# Patient Record
Sex: Female | Born: 1955 | Race: White | Hispanic: No | Marital: Married | Smoking: Never smoker
Health system: Southern US, Community
[De-identification: ages and names within clinical notes are randomized; demographics above are authoritative.]

## PROBLEM LIST (undated history)

## (undated) DIAGNOSIS — F419 Anxiety disorder, unspecified: Secondary | ICD-10-CM

## (undated) DIAGNOSIS — Z9109 Other allergy status, other than to drugs and biological substances: Secondary | ICD-10-CM

## (undated) DIAGNOSIS — I10 Essential (primary) hypertension: Secondary | ICD-10-CM

## (undated) DIAGNOSIS — F32A Depression, unspecified: Secondary | ICD-10-CM

## (undated) DIAGNOSIS — K759 Inflammatory liver disease, unspecified: Secondary | ICD-10-CM

## (undated) DIAGNOSIS — R0602 Shortness of breath: Secondary | ICD-10-CM

## (undated) DIAGNOSIS — Z9221 Personal history of antineoplastic chemotherapy: Secondary | ICD-10-CM

## (undated) DIAGNOSIS — C801 Malignant (primary) neoplasm, unspecified: Secondary | ICD-10-CM

## (undated) DIAGNOSIS — H409 Unspecified glaucoma: Secondary | ICD-10-CM

## (undated) DIAGNOSIS — Z923 Personal history of irradiation: Secondary | ICD-10-CM

## (undated) DIAGNOSIS — K219 Gastro-esophageal reflux disease without esophagitis: Secondary | ICD-10-CM

## (undated) DIAGNOSIS — R51 Headache: Secondary | ICD-10-CM

## (undated) DIAGNOSIS — Z9889 Other specified postprocedural states: Secondary | ICD-10-CM

## (undated) DIAGNOSIS — R232 Flushing: Secondary | ICD-10-CM

## (undated) DIAGNOSIS — M810 Age-related osteoporosis without current pathological fracture: Secondary | ICD-10-CM

## (undated) DIAGNOSIS — C50919 Malignant neoplasm of unspecified site of unspecified female breast: Secondary | ICD-10-CM

## (undated) DIAGNOSIS — F329 Major depressive disorder, single episode, unspecified: Secondary | ICD-10-CM

## (undated) DIAGNOSIS — E785 Hyperlipidemia, unspecified: Secondary | ICD-10-CM

## (undated) DIAGNOSIS — R519 Headache, unspecified: Secondary | ICD-10-CM

## (undated) DIAGNOSIS — J302 Other seasonal allergic rhinitis: Secondary | ICD-10-CM

## (undated) DIAGNOSIS — H269 Unspecified cataract: Secondary | ICD-10-CM

## (undated) DIAGNOSIS — R112 Nausea with vomiting, unspecified: Secondary | ICD-10-CM

## (undated) HISTORY — DX: Depression, unspecified: F32.A

## (undated) HISTORY — PX: BREAST SURGERY: SHX581

## (undated) HISTORY — DX: Essential (primary) hypertension: I10

## (undated) HISTORY — PX: TUBAL LIGATION: SHX77

## (undated) HISTORY — DX: Other seasonal allergic rhinitis: J30.2

## (undated) HISTORY — DX: Other allergy status, other than to drugs and biological substances: Z91.09

## (undated) HISTORY — DX: Age-related osteoporosis without current pathological fracture: M81.0

## (undated) HISTORY — PX: DILATION AND CURETTAGE OF UTERUS: SHX78

## (undated) HISTORY — DX: Anxiety disorder, unspecified: F41.9

## (undated) HISTORY — PX: WISDOM TOOTH EXTRACTION: SHX21

## (undated) HISTORY — DX: Unspecified cataract: H26.9

## (undated) HISTORY — PX: TONSILECTOMY/ADENOIDECTOMY WITH MYRINGOTOMY: SHX6125

## (undated) HISTORY — DX: Hyperlipidemia, unspecified: E78.5

## (undated) HISTORY — PX: EYE SURGERY: SHX253

## (undated) HISTORY — DX: Major depressive disorder, single episode, unspecified: F32.9

## (undated) HISTORY — PX: AUGMENTATION MAMMAPLASTY: SUR837

## (undated) HISTORY — PX: COLONOSCOPY: SHX174

## (undated) HISTORY — DX: Flushing: R23.2

## (undated) HISTORY — DX: Unspecified glaucoma: H40.9

## (undated) HISTORY — DX: Malignant neoplasm of unspecified site of unspecified female breast: C50.919

---

## 1999-04-13 ENCOUNTER — Other Ambulatory Visit: Admission: RE | Admit: 1999-04-13 | Discharge: 1999-04-13 | Payer: Self-pay | Admitting: Obstetrics and Gynecology

## 2001-02-13 ENCOUNTER — Encounter: Payer: Self-pay | Admitting: Internal Medicine

## 2001-02-13 ENCOUNTER — Ambulatory Visit (HOSPITAL_COMMUNITY): Admission: RE | Admit: 2001-02-13 | Discharge: 2001-02-13 | Payer: Self-pay | Admitting: Internal Medicine

## 2001-03-25 ENCOUNTER — Other Ambulatory Visit: Admission: RE | Admit: 2001-03-25 | Discharge: 2001-03-25 | Payer: Self-pay | Admitting: Obstetrics and Gynecology

## 2002-06-02 ENCOUNTER — Other Ambulatory Visit: Admission: RE | Admit: 2002-06-02 | Discharge: 2002-06-02 | Payer: Self-pay | Admitting: Obstetrics and Gynecology

## 2003-06-22 ENCOUNTER — Encounter: Payer: Self-pay | Admitting: Gastroenterology

## 2003-06-23 ENCOUNTER — Encounter: Payer: Self-pay | Admitting: Gastroenterology

## 2003-07-27 ENCOUNTER — Encounter: Payer: Self-pay | Admitting: Gastroenterology

## 2003-08-05 ENCOUNTER — Other Ambulatory Visit: Admission: RE | Admit: 2003-08-05 | Discharge: 2003-08-05 | Payer: Self-pay | Admitting: Obstetrics and Gynecology

## 2005-03-02 ENCOUNTER — Other Ambulatory Visit: Admission: RE | Admit: 2005-03-02 | Discharge: 2005-03-02 | Payer: Self-pay | Admitting: Obstetrics and Gynecology

## 2005-11-05 DIAGNOSIS — Z9109 Other allergy status, other than to drugs and biological substances: Secondary | ICD-10-CM

## 2005-11-05 HISTORY — DX: Other allergy status, other than to drugs and biological substances: Z91.09

## 2006-10-22 ENCOUNTER — Ambulatory Visit: Payer: Self-pay | Admitting: Gastroenterology

## 2006-10-22 LAB — CONVERTED CEMR LAB
ALT: 11 units/L (ref 0–40)
AST: 13 units/L (ref 0–37)
Albumin: 3.6 g/dL (ref 3.5–5.2)
Alkaline Phosphatase: 45 units/L (ref 39–117)
BUN: 11 mg/dL (ref 6–23)
Basophils Absolute: 0.1 10*3/uL (ref 0.0–0.1)
Basophils Relative: 1 % (ref 0.0–1.0)
CO2: 23 meq/L (ref 19–32)
Calcium: 8.8 mg/dL (ref 8.4–10.5)
Chloride: 106 meq/L (ref 96–112)
Creatinine, Ser: 1.2 mg/dL (ref 0.4–1.2)
Eosinophil percent: 2 % (ref 0.0–5.0)
Folate: 6 ng/mL
GFR calc non Af Amer: 50 mL/min
Glomerular Filtration Rate, Af Am: 61 mL/min/{1.73_m2}
Glucose, Bld: 81 mg/dL (ref 70–99)
HCT: 41.4 % (ref 36.0–46.0)
Hemoglobin: 13.8 g/dL (ref 12.0–15.0)
Lymphocytes Relative: 28.9 % (ref 12.0–46.0)
MCHC: 33.2 g/dL (ref 30.0–36.0)
MCV: 99.7 fL (ref 78.0–100.0)
Monocytes Absolute: 0.3 10*3/uL (ref 0.2–0.7)
Monocytes Relative: 5.8 % (ref 3.0–11.0)
Neutro Abs: 3.5 10*3/uL (ref 1.4–7.7)
Neutrophils Relative %: 62.3 % (ref 43.0–77.0)
Platelets: 277 10*3/uL (ref 150–400)
Potassium: 3.7 meq/L (ref 3.5–5.1)
RBC: 4.15 M/uL (ref 3.87–5.11)
RDW: 12.3 % (ref 11.5–14.6)
Sed Rate: 10 mm/hr (ref 0–25)
Sodium: 137 meq/L (ref 135–145)
T4, Total: 7 ug/dL (ref 5.0–12.5)
TSH: 0.43 microintl units/mL (ref 0.35–5.50)
Total Bilirubin: 0.6 mg/dL (ref 0.3–1.2)
Total Protein: 6.5 g/dL (ref 6.0–8.3)
Vitamin B-12: 152 pg/mL — ABNORMAL LOW (ref 211–911)
WBC: 5.6 10*3/uL (ref 4.5–10.5)

## 2006-11-05 HISTORY — PX: BREAST ENHANCEMENT SURGERY: SHX7

## 2006-11-08 ENCOUNTER — Ambulatory Visit: Payer: Self-pay | Admitting: Gastroenterology

## 2006-11-08 ENCOUNTER — Encounter (INDEPENDENT_AMBULATORY_CARE_PROVIDER_SITE_OTHER): Payer: Self-pay | Admitting: Specialist

## 2006-11-13 ENCOUNTER — Ambulatory Visit: Payer: Self-pay | Admitting: Gastroenterology

## 2006-11-15 ENCOUNTER — Encounter: Payer: Self-pay | Admitting: Gastroenterology

## 2006-11-18 ENCOUNTER — Ambulatory Visit: Payer: Self-pay | Admitting: Gastroenterology

## 2006-12-03 ENCOUNTER — Ambulatory Visit: Payer: Self-pay | Admitting: Gastroenterology

## 2006-12-11 ENCOUNTER — Ambulatory Visit (HOSPITAL_COMMUNITY): Admission: RE | Admit: 2006-12-11 | Discharge: 2006-12-11 | Payer: Self-pay | Admitting: Gastroenterology

## 2006-12-11 ENCOUNTER — Encounter: Payer: Self-pay | Admitting: Gastroenterology

## 2007-01-03 ENCOUNTER — Encounter: Admission: RE | Admit: 2007-01-03 | Discharge: 2007-01-03 | Payer: Self-pay | Admitting: Obstetrics and Gynecology

## 2007-01-03 ENCOUNTER — Ambulatory Visit (HOSPITAL_COMMUNITY): Admission: RE | Admit: 2007-01-03 | Discharge: 2007-01-03 | Payer: Self-pay | Admitting: Plastic Surgery

## 2009-03-31 ENCOUNTER — Encounter: Admission: RE | Admit: 2009-03-31 | Discharge: 2009-03-31 | Payer: Self-pay | Admitting: Obstetrics and Gynecology

## 2009-06-15 DIAGNOSIS — F32A Depression, unspecified: Secondary | ICD-10-CM | POA: Insufficient documentation

## 2009-06-15 DIAGNOSIS — F329 Major depressive disorder, single episode, unspecified: Secondary | ICD-10-CM

## 2009-06-15 DIAGNOSIS — K589 Irritable bowel syndrome without diarrhea: Secondary | ICD-10-CM | POA: Insufficient documentation

## 2009-06-15 DIAGNOSIS — E538 Deficiency of other specified B group vitamins: Secondary | ICD-10-CM | POA: Insufficient documentation

## 2009-06-15 DIAGNOSIS — B159 Hepatitis A without hepatic coma: Secondary | ICD-10-CM | POA: Insufficient documentation

## 2009-06-15 DIAGNOSIS — K449 Diaphragmatic hernia without obstruction or gangrene: Secondary | ICD-10-CM | POA: Insufficient documentation

## 2009-06-15 DIAGNOSIS — T7840XA Allergy, unspecified, initial encounter: Secondary | ICD-10-CM | POA: Insufficient documentation

## 2009-06-15 DIAGNOSIS — R519 Headache, unspecified: Secondary | ICD-10-CM | POA: Insufficient documentation

## 2009-06-15 DIAGNOSIS — F419 Anxiety disorder, unspecified: Secondary | ICD-10-CM | POA: Insufficient documentation

## 2009-06-15 DIAGNOSIS — R51 Headache: Secondary | ICD-10-CM

## 2009-06-15 DIAGNOSIS — F410 Panic disorder [episodic paroxysmal anxiety] without agoraphobia: Secondary | ICD-10-CM | POA: Insufficient documentation

## 2009-06-17 ENCOUNTER — Ambulatory Visit: Payer: Self-pay | Admitting: Gastroenterology

## 2009-06-17 DIAGNOSIS — R1013 Epigastric pain: Secondary | ICD-10-CM | POA: Insufficient documentation

## 2009-06-17 LAB — CONVERTED CEMR LAB
ALT: 13 units/L (ref 0–35)
AST: 15 units/L (ref 0–37)
Albumin: 3.9 g/dL (ref 3.5–5.2)
Alkaline Phosphatase: 55 units/L (ref 39–117)
BUN: 16 mg/dL (ref 6–23)
Basophils Absolute: 0 10*3/uL (ref 0.0–0.1)
Basophils Relative: 0.5 % (ref 0.0–3.0)
Bilirubin, Direct: 0.1 mg/dL (ref 0.0–0.3)
CO2: 24 meq/L (ref 19–32)
Calcium: 9.4 mg/dL (ref 8.4–10.5)
Chloride: 114 meq/L — ABNORMAL HIGH (ref 96–112)
Creatinine, Ser: 1.2 mg/dL (ref 0.4–1.2)
Eosinophils Absolute: 0.2 10*3/uL (ref 0.0–0.7)
Eosinophils Relative: 2.3 % (ref 0.0–5.0)
Ferritin: 52.1 ng/mL (ref 10.0–291.0)
Folate: 7.9 ng/mL
GFR calc non Af Amer: 49.82 mL/min (ref >60)
Glucose, Bld: 97 mg/dL (ref 70–99)
HCT: 39.4 % (ref 36.0–46.0)
Hemoglobin: 13.7 g/dL (ref 12.0–15.0)
Iron: 143 ug/dL (ref 42–145)
Lymphocytes Relative: 23.9 % (ref 12.0–46.0)
Lymphs Abs: 1.8 10*3/uL (ref 0.7–4.0)
MCHC: 34.8 g/dL (ref 30.0–36.0)
MCV: 99.3 fL (ref 78.0–100.0)
Magnesium: 2.1 mg/dL (ref 1.5–2.5)
Monocytes Absolute: 0.3 10*3/uL (ref 0.1–1.0)
Monocytes Relative: 3.7 % (ref 3.0–12.0)
Neutro Abs: 5.2 10*3/uL (ref 1.4–7.7)
Neutrophils Relative %: 69.6 % (ref 43.0–77.0)
Platelets: 275 10*3/uL (ref 150.0–400.0)
Potassium: 4.7 meq/L (ref 3.5–5.1)
RBC: 3.97 M/uL (ref 3.87–5.11)
RDW: 13.2 % (ref 11.5–14.6)
Saturation Ratios: 38.4 % (ref 20.0–50.0)
Sodium: 143 meq/L (ref 135–145)
TSH: 0.48 microintl units/mL (ref 0.35–5.50)
Total Bilirubin: 0.7 mg/dL (ref 0.3–1.2)
Total Protein: 7 g/dL (ref 6.0–8.3)
Transferrin: 266 mg/dL (ref 212.0–360.0)
Vit D, 25-Hydroxy: 43 ng/mL (ref 30–89)
Vitamin B-12: 301 pg/mL (ref 211–911)
WBC: 7.5 10*3/uL (ref 4.5–10.5)

## 2009-06-23 ENCOUNTER — Encounter: Payer: Self-pay | Admitting: Gastroenterology

## 2010-06-14 ENCOUNTER — Encounter: Admission: RE | Admit: 2010-06-14 | Discharge: 2010-06-14 | Payer: Self-pay | Admitting: Obstetrics and Gynecology

## 2011-03-23 NOTE — Assessment & Plan Note (Signed)
[facility]                         GASTROENTEROLOGY OFFICE NOTE   KALIOPE, QUINONEZ                   MRN:          [id number]  DATE:12/03/2006                            DOB:          03/21/1956    Natalin continues with some gas and bloating but no diarrhea.  She has had  no improvement with Entocort.  Small bowel biopsy at colonoscopy was  negative, as were inflammatory bowel disease serologies.   Her main complaint today is one of early satiety and fullness after  eating with epigastric discomfort.  She did have an endoscopy a couple  of years ago that was unremarkable.  She denies any hepatobiliary  complaints and had a negative ultrasound in August, 2004.   She has chronic depressed and is on multiple antidepressants and  benzodiazepines.  This may be contributing to her problems and affect.   PHYSICAL EXAMINATION:  VITAL SIGNS:  She weighs 113 pounds.  Her blood  pressure is 112/82, and pulse was 72 and regular.  ABDOMEN:  Unremarkable.  I could not appreciate a succussion splash in  the epigastric area.  Bowel sounds were normal.   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  There has been no evidence to suggest that she has  inflammatory bowel disease, and her symptoms really do not sound like  bacterial overgrowth syndrome.   RECOMMENDATIONS:  1. Discontinue Entocort.  2. Trial of Align and probiotic therapy daily.  3. Check Technetium (Tc)/gastric emptying scan.     Vania Rea. Jarold Motto, MD, Caleen Essex, FAGA  Electronically Signed    DRP/MedQ  DD: 12/03/2006  DT: 12/03/2006  Job #: [id number]   cc:   Lovenia Kim, D.O.

## 2011-03-23 NOTE — Assessment & Plan Note (Signed)
[facility]                         GASTROENTEROLOGY OFFICE NOTE   [facility]za, Melissa Garza                   MRN:          [id number]  DATE:10/22/2006                            DOB:          Mar 29, 1956    REFERRING PHYSICIAN:  Miguel Aschoff, M.D.   Melissa Garza is a 55 year old white female referred through the  courtesy of Dr. Miguel Aschoff for evaluation of guaiac positive stool noted  on physical exam on June 18, 2006.   I have known Melissa Garza for a good period of time and she has rather severe  irritable bowel syndrome.  She had a colonoscopy and endoscopy done in  November of 2004, both of which were unremarkable.  She did respond  briefly to Zelnorm therapy.   She continues to be constipated, but denies melena or hematochezia.  She  has some vague upper GI discomfort at times, but nothing persistent.  She did have previous ultrasound exam in August of 2004 that was  unremarkable.  She denies any specific food intolerances.  She is not  aware of any history of anemia or other gastrointestinal or metabolic  problems.  She did have hepatitis A several years ago.  She denies abuse  of alcohol, or cigarettes, or NSAIDs.  She has had no significant weight  loss.   PAST MEDICAL HISTORY:  Remarkable for rather severe anxiety and  depression, and she is on multiple psychotropic drugs including Zonegran  100 mg 4 times a day, Ambien 10 mg at bedtime, halazepam 1 mg a day,  Librax 1 a day, Wellbutrin 150 mg a day, Lexapro 10 mg a day, Concerta  daily, trazodone 50 mg a day, and femhrt daily.   SHE DENIES DRUG ALLERGIES.   The patient apparently is followed medically by Dr. Marisue Brooklyn.   FAMILY HISTORY:  Noncontributory.   REVIEW OF SYSTEMS:  Otherwise noncontributory, without any symptoms of  collagen vascular disease or Raynaud's phenomenon.  She does complain of  chronic fatigue which may be related to her depression.   EXAMINATION:  Shows her  to be an attractive healthy-appearing white  female, appearing younger than her stated age.  She weighed 120 pounds,  blood pressure 112/76.  Pulse was 60 and regular.  Could not appreciate  stigmata of chronic liver disease, thyromegaly or lymphadenopathy.  CHEST:  Clear to percussion, auscultation, there were no murmurs,  gallops or rubs noted.  She was in a regular rhythm.  ABDOMINAL:  There was no hepatosplenomegaly, abdominal masses,  tenderness, or distention.  Bowel sounds were normal.  PERIPHERAL EXTREMITIES:  Unremarkable, as were reflexes.  RECTUM:  Unremarkable, as was rectal exam.  There was no evidence of a  rectal impaction, but there was formed stool positive which was guaiac  negative.   ASSESSMENT:  Melissa Garza has constipation predominant irritable bowel syndrome  and also a long history of anxiety and depression.  However, we can not  ignore her guaiac positive stool because of her age.   RECOMMENDATIONS:  1. Outpatient colonoscopy at her convenience.  2. Check CBC, metabolic parameters.  3. Trial of Amitiza 24 mcg twice a  day with meals.  4. Continue all the multiple medications as listed above per other      physicians.     Melissa Garza. Jarold Motto, MD, Caleen Essex, FAGA  Electronically Signed    DRP/MedQ  DD: 10/22/2006  DT: 10/22/2006  Job #: [id number]   cc:   Miguel Aschoff, M.D.  Lovenia Kim, D.O.

## 2011-08-20 ENCOUNTER — Other Ambulatory Visit: Payer: Self-pay | Admitting: Gastroenterology

## 2011-11-30 ENCOUNTER — Other Ambulatory Visit: Payer: Self-pay | Admitting: Obstetrics and Gynecology

## 2011-11-30 DIAGNOSIS — Z1231 Encounter for screening mammogram for malignant neoplasm of breast: Secondary | ICD-10-CM

## 2011-12-12 ENCOUNTER — Ambulatory Visit
Admission: RE | Admit: 2011-12-12 | Discharge: 2011-12-12 | Disposition: A | Discharge status: Home / Self Care | Payer: BC Managed Care – PPO | Source: Ambulatory Visit | Attending: Obstetrics and Gynecology | Admitting: Obstetrics and Gynecology

## 2011-12-12 DIAGNOSIS — Z1231 Encounter for screening mammogram for malignant neoplasm of breast: Secondary | ICD-10-CM

## 2012-03-14 DIAGNOSIS — H40029 Open angle with borderline findings, high risk, unspecified eye: Secondary | ICD-10-CM | POA: Insufficient documentation

## 2013-09-02 ENCOUNTER — Other Ambulatory Visit: Payer: Self-pay | Admitting: Obstetrics and Gynecology

## 2014-07-13 ENCOUNTER — Other Ambulatory Visit: Payer: Self-pay

## 2014-07-13 DIAGNOSIS — Z1231 Encounter for screening mammogram for malignant neoplasm of breast: Secondary | ICD-10-CM

## 2014-07-26 ENCOUNTER — Other Ambulatory Visit: Payer: Self-pay

## 2014-07-26 DIAGNOSIS — N63 Unspecified lump in unspecified breast: Secondary | ICD-10-CM

## 2014-07-26 DIAGNOSIS — Z1231 Encounter for screening mammogram for malignant neoplasm of breast: Secondary | ICD-10-CM

## 2014-07-29 ENCOUNTER — Other Ambulatory Visit: Payer: Self-pay | Admitting: Obstetrics and Gynecology

## 2014-07-29 ENCOUNTER — Other Ambulatory Visit: Payer: Self-pay

## 2014-07-29 DIAGNOSIS — N6312 Unspecified lump in the right breast, upper inner quadrant: Secondary | ICD-10-CM

## 2014-08-04 ENCOUNTER — Ambulatory Visit
Admission: RE | Admit: 2014-08-04 | Discharge: 2014-08-04 | Disposition: A | Discharge status: Home / Self Care | Payer: BC Managed Care – PPO | Source: Ambulatory Visit | Attending: Obstetrics and Gynecology | Admitting: Obstetrics and Gynecology

## 2014-08-04 ENCOUNTER — Other Ambulatory Visit: Payer: Self-pay | Admitting: Obstetrics and Gynecology

## 2014-08-04 DIAGNOSIS — N6312 Unspecified lump in the right breast, upper inner quadrant: Secondary | ICD-10-CM

## 2014-08-05 ENCOUNTER — Other Ambulatory Visit: Payer: Self-pay | Admitting: Obstetrics and Gynecology

## 2014-08-05 DIAGNOSIS — C50911 Malignant neoplasm of unspecified site of right female breast: Secondary | ICD-10-CM

## 2014-08-05 DIAGNOSIS — D0511 Intraductal carcinoma in situ of right breast: Secondary | ICD-10-CM

## 2014-08-06 ENCOUNTER — Telehealth: Payer: Self-pay | Admitting: *Deleted

## 2014-08-06 DIAGNOSIS — Z853 Personal history of malignant neoplasm of breast: Secondary | ICD-10-CM | POA: Insufficient documentation

## 2014-08-06 DIAGNOSIS — C50211 Malignant neoplasm of upper-inner quadrant of right female breast: Secondary | ICD-10-CM

## 2014-08-06 NOTE — Telephone Encounter (Signed)
Confirmed [facility] for 08/11/14 at 1200.  Instructions and contact information given.

## 2014-08-09 ENCOUNTER — Ambulatory Visit
Admission: RE | Admit: 2014-08-09 | Discharge: 2014-08-09 | Disposition: A | Discharge status: Home / Self Care | Payer: BC Managed Care – PPO | Source: Ambulatory Visit | Attending: Obstetrics and Gynecology | Admitting: Obstetrics and Gynecology

## 2014-08-09 DIAGNOSIS — D0511 Intraductal carcinoma in situ of right breast: Secondary | ICD-10-CM

## 2014-08-09 DIAGNOSIS — C50911 Malignant neoplasm of unspecified site of right female breast: Secondary | ICD-10-CM

## 2014-08-09 MED ORDER — GADOBENATE DIMEGLUMINE 529 MG/ML IV SOLN
13.0000 mL | Freq: Once | INTRAVENOUS | Status: AC | PRN
  Administered 2014-08-09: 13 mL via INTRAVENOUS

## 2014-08-11 ENCOUNTER — Ambulatory Visit: Payer: BC Managed Care – PPO | Attending: General Surgery | Admitting: Physical Therapy

## 2014-08-11 ENCOUNTER — Ambulatory Visit (HOSPITAL_BASED_OUTPATIENT_CLINIC_OR_DEPARTMENT_OTHER): Payer: BC Managed Care – PPO | Admitting: Oncology

## 2014-08-11 ENCOUNTER — Ambulatory Visit: Payer: BC Managed Care – PPO

## 2014-08-11 ENCOUNTER — Other Ambulatory Visit (HOSPITAL_BASED_OUTPATIENT_CLINIC_OR_DEPARTMENT_OTHER): Payer: BC Managed Care – PPO

## 2014-08-11 ENCOUNTER — Encounter: Payer: Self-pay | Admitting: Oncology

## 2014-08-11 ENCOUNTER — Ambulatory Visit
Admission: RE | Admit: 2014-08-11 | Discharge: 2014-08-11 | Disposition: A | Discharge status: Home / Self Care | Payer: BC Managed Care – PPO | Source: Ambulatory Visit | Attending: Radiation Oncology | Admitting: Radiation Oncology

## 2014-08-11 ENCOUNTER — Other Ambulatory Visit (INDEPENDENT_AMBULATORY_CARE_PROVIDER_SITE_OTHER): Payer: Self-pay | Admitting: General Surgery

## 2014-08-11 VITALS — BP 141/78 | HR 75 | Temp 98.3°F | Resp 18 | Ht 61.5 in | Wt 149.1 lb

## 2014-08-11 DIAGNOSIS — C50919 Malignant neoplasm of unspecified site of unspecified female breast: Secondary | ICD-10-CM | POA: Insufficient documentation

## 2014-08-11 DIAGNOSIS — Z171 Estrogen receptor negative status [ER-]: Secondary | ICD-10-CM

## 2014-08-11 DIAGNOSIS — R293 Abnormal posture: Secondary | ICD-10-CM | POA: Diagnosis not present

## 2014-08-11 DIAGNOSIS — C50211 Malignant neoplasm of upper-inner quadrant of right female breast: Secondary | ICD-10-CM

## 2014-08-11 DIAGNOSIS — F418 Other specified anxiety disorders: Secondary | ICD-10-CM

## 2014-08-11 LAB — COMPREHENSIVE METABOLIC PANEL (CC13)
ALBUMIN: 3.3 g/dL — AB (ref 3.5–5.0)
ALK PHOS: 84 U/L (ref 40–150)
ALT: 11 U/L (ref 0–55)
AST: 11 U/L (ref 5–34)
Anion Gap: 5 mEq/L (ref 3–11)
BUN: 12.1 mg/dL (ref 7.0–26.0)
CO2: 27 mEq/L (ref 22–29)
CREATININE: 0.8 mg/dL (ref 0.6–1.1)
Calcium: 8.7 mg/dL (ref 8.4–10.4)
Chloride: 107 mEq/L (ref 98–109)
Glucose: 91 mg/dl (ref 70–140)
Potassium: 3.9 mEq/L (ref 3.5–5.1)
Sodium: 140 mEq/L (ref 136–145)
Total Bilirubin: 0.25 mg/dL (ref 0.20–1.20)
Total Protein: 6.4 g/dL (ref 6.4–8.3)

## 2014-08-11 LAB — CBC WITH DIFFERENTIAL/PLATELET
BASO%: 0.6 % (ref 0.0–2.0)
BASOS ABS: 0 10*3/uL (ref 0.0–0.1)
EOS%: 4.3 % (ref 0.0–7.0)
Eosinophils Absolute: 0.3 10*3/uL (ref 0.0–0.5)
HCT: 35.9 % (ref 34.8–46.6)
HEMOGLOBIN: 11.9 g/dL (ref 11.6–15.9)
LYMPH%: 42.9 % (ref 14.0–49.7)
MCH: 32.1 pg (ref 25.1–34.0)
MCHC: 33.1 g/dL (ref 31.5–36.0)
MCV: 96.8 fL (ref 79.5–101.0)
MONO#: 0.5 10*3/uL (ref 0.1–0.9)
MONO%: 7.1 % (ref 0.0–14.0)
NEUT%: 45.1 % (ref 38.4–76.8)
NEUTROS ABS: 2.9 10*3/uL (ref 1.5–6.5)
PLATELETS: 198 10*3/uL (ref 145–400)
RBC: 3.71 10*6/uL (ref 3.70–5.45)
RDW: 12.7 % (ref 11.2–14.5)
WBC: 6.4 10*3/uL (ref 3.9–10.3)
lymph#: 2.8 10*3/uL (ref 0.9–3.3)

## 2014-08-11 NOTE — Progress Notes (Signed)
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 She brought this to her gynecologist attention and he set her up for bilateral diagnostic mammography at the breast center 08/04/2014. The breast density was category C. in the patient has bilateral saline implants in place. In the palpable area of concern in the right breast there was an irregular mass measuring up to 2.5 cm. By palpation this was firm at the 12:30 o'clock position. Ultrasound of the right breast confirmed an irregular hypoechoic mass in this area measuring 2.5 cm. There was no right axillary lymphadenopathy noted.  Biopsy of the mass in question 08/04/2014 showed ([id number]) invasive ductal carcinoma, grade 3, triple negative, with an MIB-1 of 90%.  Bilateral breast MRI 08/09/2014 showed in the upper inner quadrant of the right breast an irregular enhancing mass measuring 2.8 cm. There was a 4 mm satellite nodule anterior to this and separated from that by 0.6 cm. The left breast, the remaining of the right breast and the lymph node areas were negative.  The patient's subsequent history is as detailed below  INTERVAL HISTORY: Melissa Garza was evaluated in the multidisciplinary breast cancer clinic accompanied by her husband Melissa Garza and her daughter-in-law Melissa Garza  REVIEW OF  SYSTEMS: Melissa Garza is chronically fatigued. She works third shift. She currently has a little bit of a runny nose and sore throat. She gets short of breath when walking up stairs. She sleeps on 2 pillows. She has occasional stress urinary incontinence. She has a history of psoriasis. She gets frequent headaches. She is anxious and depressed. She suffers from hot flashes which she describes as moderate. A detailed review of systems was otherwise negative  PAST MEDICAL HISTORY: Past Medical History  Diagnosis Date  . Glaucoma   . Anxiety   . Hot flashes   . Depression     PAST SURGICAL HISTORY: Past Surgical History  Procedure Laterality Date  . Tonsilectomy/adenoidectomy with myringotomy    . Breast enhancement surgery  2008    FAMILY HISTORY History reviewed. No pertinent family history. The patient's parents are currently alive, in their late 26s. The patient had no brothers, 2 sisters. There is no history of breast or ovarian cancer in the family to her knowledge.   GYNECOLOGIC HISTORY:  No LMP recorded. Menarche age 24, first live birth age 74. She is GX P2. She stopped having periods in 2005 and started hormone replacement at that time. 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 Her husband, Melissa Garza, is disabled secondary to multiple cardiac problems. Son Melissa Garza this and Melissa Garza and works in apartment maintenance. Son Melissa Garza me Melissa Garza is his wife) works as an Clinical biochemist. The patient has 6 grandchildren. She is not a Ambulance person.    ADVANCED DIRECTIVES: Not in place   HEALTH MAINTENANCE: History  Substance Use Topics  . Smoking status: Never Smoker   . Smokeless tobacco: Not on file  . Alcohol Use: No     Colonoscopy:Remote  PAP:2014  Bone density:Never  Lipid panel:  No Known Allergies  Current Outpatient Prescriptions  Medication  Sig Dispense Refill  . ALPRAZolam (XANAX) 1 MG tablet Take 1 mg by mouth 4 (four) times daily as needed for anxiety.      . Armodafinil (NUVIGIL) 250 MG tablet Take 250 mg by mouth daily.      . benzonatate (TESSALON) 200 MG capsule Take 200 mg by mouth 3 (three) times daily as needed for cough.      . butalbital-acetaminophen-caffeine (FIORICET, ESGIC) 50-325-40 MG per tablet Take by mouth 2 (two) times daily as needed for headache.      . clobetasol (TEMOVATE) 0.05 % external solution Apply 1 application topically 2 (two) times daily.      Marland Kitchen estradiol-norethindrone (ACTIVELLA) 1-0.5 MG per tablet Take 1 tablet by mouth daily.      . fluocinolone (SYNALAR) 0.01 % external solution Apply topically 2 (two) times daily.      Marland Kitchen FLUoxetine (PROZAC) 40 MG capsule Take 40 mg by mouth daily.      . fluticasone (CUTIVATE) 0.05 % cream Apply topically 2 (two) times daily.      . methylphenidate 54 MG PO CR tablet Take 54 mg by mouth every morning.      . nystatin-triamcinolone (MYCOLOG II) cream Apply 1 application topically 2 (two) times daily.      . travoprost, benzalkonium, (TRAVATAN) 0.004 % ophthalmic solution 1 drop at bedtime.      . traZODone (DESYREL) 100 MG tablet Take 100 mg by mouth.      . zolpidem (AMBIEN) 10 MG tablet Take 10 mg by mouth at bedtime as needed for sleep.       No current facility-administered medications for this visit.    OBJECTIVE: Middle-aged white woman who appears stated age  58 Vitals:   08/11/14 1253  BP: 141/78  Pulse: 75  Temp: 98.3 F (36.8 C)  Resp: 18     Body mass index is 27.72 kg/(m^2).    ECOG FS:1 - Symptomatic but completely ambulatory  Ocular: Sclerae unicteric, pupils equal, round and reactive to light Ear-nose-throat: Oropharynx clear, slightly dry  Lymphatic: No cervical or supraclavicular adenopathy Lungs no rales or rhonchi, good excursion bilaterally Heart regular rate and rhythm, no murmur appreciated Abd soft, nontender, positive  bowel sounds MSK no focal spinal tenderness, no joint edema Neuro: Hands are tremulous, otherwise non-focal, well-oriented, appropriate affect Breasts: The right breast is status post recent biopsy. There is a small ecchymosis associated with this. There is a mass in the upper inner quadrant which is easily palpated, measures between 2 and 3 cm, and is movable. There are no skin or nipple changes of concern. The right axilla is benign. Left breast is unremarkable. Her   LAB RESULTS:  CMP     Component Value Date/Time   NA 140 08/11/2014 1132   NA 143 06/17/2009 1137   K 3.9 08/11/2014 1132   K 4.7 06/17/2009 1137   CL 114* 06/17/2009 1137   CO2 27 08/11/2014 1132   CO2 24 06/17/2009 1137   GLUCOSE 91 08/11/2014 1132   GLUCOSE 97 06/17/2009 1137   GLUCOSE 81 10/22/2006 1253   BUN 12.1 08/11/2014 1132   BUN 16 06/17/2009 1137   CREATININE 0.8 08/11/2014 1132   CREATININE 1.2 06/17/2009 1137  CALCIUM 8.7 08/11/2014 1132   CALCIUM 9.4 06/17/2009 1137   PROT 6.4 08/11/2014 1132   PROT 7.0 06/17/2009 1137   ALBUMIN 3.3* 08/11/2014 1132   ALBUMIN 3.9 06/17/2009 1137   AST 11 08/11/2014 1132   AST 15 06/17/2009 1137   ALT 11 08/11/2014 1132   ALT 13 06/17/2009 1137   ALKPHOS 84 08/11/2014 1132   ALKPHOS 55 06/17/2009 1137   BILITOT 0.25 08/11/2014 1132   BILITOT 0.7 06/17/2009 1137   GFRNONAA 49.82 06/17/2009 1137    I No results found for this basename: SPEP, UPEP,  kappa and lambda light chains    Lab Results  Component Value Date   WBC 6.4 08/11/2014   NEUTROABS 2.9 08/11/2014   HGB 11.9 08/11/2014   HCT 35.9 08/11/2014   MCV 96.8 08/11/2014   PLT 198 08/11/2014      Chemistry      Component Value Date/Time   NA 140 08/11/2014 1132   NA 143 06/17/2009 1137   K 3.9 08/11/2014 1132   K 4.7 06/17/2009 1137   CL 114* 06/17/2009 1137   CO2 27 08/11/2014 1132   CO2 24 06/17/2009 1137   BUN 12.1 08/11/2014 1132   BUN 16 06/17/2009 1137   CREATININE 0.8 08/11/2014 1132   CREATININE 1.2 06/17/2009 1137       Component Value Date/Time   CALCIUM 8.7 08/11/2014 1132   CALCIUM 9.4 06/17/2009 1137   ALKPHOS 84 08/11/2014 1132   ALKPHOS 55 06/17/2009 1137   AST 11 08/11/2014 1132   AST 15 06/17/2009 1137   ALT 11 08/11/2014 1132   ALT 13 06/17/2009 1137   BILITOT 0.25 08/11/2014 1132   BILITOT 0.7 06/17/2009 1137       No results found for this basename: LABCA2    No components found with this basename: LABCA125    No results found for this basename: INR,  in the last 168 hours  Urinalysis No results found for this basename: colorurine, appearanceur, labspec, phurine, glucoseu, hgbur, bilirubinur, ketonesur, proteinur, urobilinogen, nitrite, leukocytesur    STUDIES: Mr Breast Bilateral W Wo Contrast  08/09/2014   CLINICAL DATA:  Recent diagnosis of grade 3 invasive ductal carcinoma and 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  EXAM: BILATERAL BREAST MRI WITH AND WITHOUT CONTRAST  TECHNIQUE: Multiplanar, multisequence MR images of both breasts were obtained prior to and following the intravenous administration of 29m of MultiHance.  THREE-DIMENSIONAL MR IMAGE RENDERING ON INDEPENDENT WORKSTATION:  Three-dimensional MR images were rendered by post-processing of the original MR data on an independent workstation. The three-dimensional MR images were interpreted, and findings are reported in the following complete MRI report for this study. Three dimensional images were evaluated at the independent DynaCad workstation  COMPARISON:  Recent exams in September 2015.  FINDINGS: Breast composition: c:  Heterogeneous fibroglandular tissue  Background parenchymal enhancement: Moderate  Right breast: There is a subpectoral saline breast implant. In the upper inner quadrant, within the posterior third of the breast parenchyma, is a heterogeneously-enhancing irregular mass with central biopsy clip artifact. The mass measures 2.2 x  2.2 x 2.8 cm and demonstrates washout kinetics. There is a 4 mm satellite nodule 6 mm directly anterior to the lateral margin of the biopsy-proven carcinoma. The remainder of the right breast is negative.  Left breast: Subpectoral saline breast implant is present. No mass or abnormal enhancement.  Lymph nodes:  No abnormal appearing internal mammary chain or axillary lymph nodes are dense fat bilaterally.  Ancillary findings:  None.  IMPRESSION: 1. Biopsy proven carcinoma in the upper inner quadrant of the right breast measures 2.2 x 2.2 x 2.8 cm by MRI. Biopsy clip is positioned within the mass. There is a single 4 mm satellite nodule positioned 6 mm anterior to the lateral margin of the biopsy-proven carcinoma.  2.  No MRI evidence of malignancy in the left breast.  3.  Negative for lymphadenopathy.  RECOMMENDATION: Treatment planning for known right breast carcinoma.  BI-RADS CATEGORY  6: Known biopsy-proven malignancy.   Electronically Signed   By: Curlene Dolphin M.D.   On: 08/09/2014 12:55   Mm Digital Diagnostic Unilat R  08/04/2014   CLINICAL DATA:  Post biopsy of a highly suspicious mass in the upper right breast at 10/1929.  EXAM: DIAGNOSTIC RIGHT MAMMOGRAM POST ULTRASOUND BIOPSY  COMPARISON:  Previous exams  FINDINGS: Mammographic images were obtained following ultrasound guided biopsy of a highly suspicious mass in the right breast at 12:30. A heart shaped biopsy marking clip is present in the targeted region of the mass.  IMPRESSION: Appropriate positioning of Heart 123012 shaped biopsy marking clip post biopsy of a highly suspicious mass in the right breast at 12:30.  Final Assessment: Post Procedure Mammograms for Marker Placement   Electronically Signed   By: Everlean Alstrom M.D.   On: 08/04/2014 10:51   US Breast Ltd Uni Right Inc Axilla  08/04/2014   CLINICAL DATA:  58 year old female with a palpable abnormality in the right breast.  EXAM: DIGITAL DIAGNOSTIC  BILATERAL MAMMOGRAM WITH CAD   ULTRASOUND RIGHT BREAST  COMPARISON:  Previous exams.  ACR Breast Density Category c: The breast tissue is heterogeneously dense, which Garza obscure small masses.  FINDINGS: Bilateral saline implants are present. Standard and implant displaced views were performed. Spot compression tangential view over the palpable site of concern in the right breast was performed demonstrating an irregular mass measuring up to 2.5 cm. No suspicious masses or calcifications are seen in the left breast.  Mammographic images were processed with CAD.  Physical examination at site of palpable concern reveals a firm mass at the approximate 12:30 position.  Targeted ultrasound of the right breast was performed demonstrating an irregular hypoechoic mass with increased vascularity at $RemoveBefore'12 30 7 '[id number]$ cm from the nipple measuring 2.2 x 1.3 x 2.5 cm. This corresponds with mammography findings. No definite lymphadenopathy seen in the right axilla.  IMPRESSION: Highly suspicious of right breast mass.  RECOMMENDATION: Ultrasound-guided biopsy of a highly suspicious mass in the upper right breast is recommended. This will subsequently be performed and dictated separately.  I have discussed the findings and recommendations with the patient. Results were also provided in writing at the conclusion of the visit. If applicable, a reminder letter will be sent to the patient regarding the next appointment.  BI-RADS CATEGORY  5: Highly suggestive of malignancy.   Electronically Signed   By: Everlean Alstrom M.D.   On: 08/04/2014 09:57   Mm Diag Breast W/implant Bilateral  08/04/2014   CLINICAL DATA:  58 year old female with a palpable abnormality in the right breast.  EXAM: DIGITAL DIAGNOSTIC  BILATERAL MAMMOGRAM WITH CAD  ULTRASOUND RIGHT BREAST  COMPARISON:  Previous exams.  ACR Breast Density Category c: The breast tissue is heterogeneously dense, which Garza obscure small masses.  FINDINGS: Bilateral saline implants are present. Standard and implant displaced  views were performed. Spot compression tangential view  over the palpable site of concern in the right breast was performed demonstrating an irregular mass measuring up to 2.5 cm. No suspicious masses or calcifications are seen in the left breast.  Mammographic images were processed with CAD.  Physical examination at site of palpable concern reveals a firm mass at the approximate 12:30 position.  Targeted ultrasound of the right breast was performed demonstrating an irregular hypoechoic mass with increased vascularity at 12 30 7  cm from the nipple measuring 2.2 x 1.3 x 2.5 cm. This corresponds with mammography findings. No definite lymphadenopathy seen in the right axilla.  IMPRESSION: Highly suspicious of right breast mass.  RECOMMENDATION: Ultrasound-guided biopsy of a highly suspicious mass in the upper right breast is recommended. This will subsequently be performed and dictated separately.  I have discussed the findings and recommendations with the patient. Results were also provided in writing at the conclusion of the visit. If applicable, a reminder letter will be sent to the patient regarding the next appointment.  BI-RADS CATEGORY  5: Highly suggestive of malignancy.   Electronically Signed   By: Everlean Alstrom M.D.   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 This was found to be concordant by Dr. Andres Shad. Pathology was discussed with the patient by telephone. She reported doing well after the biopsy with tenderness at the site. Post biopsy instructions were reviewed and her questions were answered. She has been scheduled at [facility] on August 11, 2014. A bilateral breast MRI will be scheduled and the patient will be contacted. She was encouraged to come to  [facility] for educational materials. My number was provided for future questions and concerns.  Pathology results reported by Susa Raring RN, BSN on August 05, 2014.   Electronically Signed   By: Everlean Alstrom M.D.   On: 08/05/2014 12:49   08/09/2014   CLINICAL DATA:  58 year old female with a highly suspicious right breast mass.  EXAM: ULTRASOUND GUIDED RIGHT BREAST CORE NEEDLE BIOPSY  COMPARISON:  Previous exams.  PROCEDURE: I met with the patient and we discussed the procedure of ultrasound-guided biopsy, including benefits and alternatives. We discussed the high likelihood of a successful procedure. We discussed the risks of the procedure including infection, bleeding, tissue injury, clip migration, and inadequate sampling. Informed written consent was given. The usual time-out protocol was performed immediately prior to the procedure.  Using sterile technique and 2% Lidocaine as local anesthetic, under direct ultrasound visualization, a 12 gauge vacuum-assisteddevice was used to perform biopsy of a highly suspicious mass in the right breast at 10/1929 using a lateral approach. At the conclusion of the procedure, a Heart shaped tissue marker clip was deployed into the biopsy cavity. Follow-up 2-view mammogram was performed and dictated separately.  IMPRESSION: Ultrasound-guided biopsy of a highly suspicious mass in the right breast. No apparent complications.  Electronically Signed: By: Everlean Alstrom M.D. On: 08/04/2014 10:28    ASSESSMENT: 58 y.o. [place] woman status post right breast upper inner quadrant biopsy 08/04/2014 for a clinical T2 N0, stage IIA invasive ductal carcinoma, grade 3, triple negative, with an MIB-1 of 90%.  PLAN: We spent the better part of today's hour-long appointment discussing the biology of breast cancer in general, and the  specifics of the patient's tumor in particular. Wiletta understands the difference between local and systemic therapy  for breast cancer. She also understands that whether we started with chemotherapy or surgery the end result will be the same.  Because her tumor is triple negative, neither antiestrogen snore anti-HER-2 treatment would be helpful. Her only option for systemic therapy is chemotherapy.  Accordingly we spoke at length regarding chemotherapy today. She will receive doxorubicin and cyclophosphamide in dose dense fashion x4 with Neulasta support followed by carboplatin and paclitaxel weekly x12. We discussed the possible toxicities, Garza effects and complications of these agents including damage to the heart muscle, immune suppression, and possibly permanent neuropathy.  Given the patient's work situation, it would probably be best for her to go on temporary disability. She will be discussing this with HR at her job. Tentatively we would like to start October 20. We will obtain an echocardiogram and have a port placed before then and she will return to see [place] in about a week before starting chemotherapy to discuss supportive meds and to make sure all the pieces are in place before we start.  The patient has a good understanding of the overall plan. She agrees with it. She knows the goal of treatment in her case is cure. She will call with any problems that Garza develop before her next visit here.  Melissa Cruel, MD   08/11/2014 2:08 PM

## 2014-08-11 NOTE — Progress Notes (Signed)
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 female who is seen today through the courtesy of Dr. Excell Seltzer for evaluation of her T2 N0 invasive ductal carcinoma of the right breast. She noted a mass along the upper inner quadrant of her right breast at approximately 12:30. Mammography and ultrasonography on 08/04/2014 showed a highly suspicious right breast mass at 12:30 measuring 2.5 cm. Ultrasound-guided biopsy on 08/04/2014 was diagnostic for invasive ductal carcinoma/DCIS. The tumor was triple negative. Breast MR on 08/09/2014 should a 2.2 x 2.2 x 2.8 cm mass along the upper inner quadrant of the right breast with a single 4 mm satellite nodule 6 mm anterior to the lateral margin. She is without complaints today. She seen today with Dr. Excell Seltzer and Dr. Jana Hakim.  PREVIOUS RADIATION THERAPY: No   PAST MEDICAL HISTORY:  has a past medical history of Glaucoma; Anxiety; Hot flashes; and Depression.     PAST SURGICAL HISTORY:  Past Surgical History  Procedure Laterality Date  . Tonsilectomy/adenoidectomy with myringotomy    . Breast enhancement surgery  2008     FAMILY HISTORY: family history is not on file. Her father has cardiac disease at 63. Her mother is alive and well at 36. No family history breast cancer.   SOCIAL HISTORY:  reports that she has never smoked. She does not have any smokeless tobacco history on file. She reports that she does not drink alcohol or use illicit drugs.  She works Education officer, community for [employer].   ALLERGIES: Review of patient's allergies indicates no  known allergies.   MEDICATIONS:  Current Outpatient Prescriptions  Medication Sig Dispense Refill  . ALPRAZolam (XANAX) 1 MG tablet Take 1 mg by mouth 4 (four) times daily as needed for anxiety.      . Armodafinil (NUVIGIL) 250 MG tablet Take 250 mg by mouth daily.      . benzonatate (TESSALON) 200 MG capsule Take 200 mg by mouth 3 (three) times daily as needed for cough.      . butalbital-acetaminophen-caffeine (FIORICET, ESGIC) 50-325-40 MG per tablet Take by mouth 2 (two) times daily as needed for headache.      . clobetasol (TEMOVATE) 0.05 % external solution Apply 1 application topically 2 (two) times daily.      Marland Kitchen estradiol-norethindrone (ACTIVELLA) 1-0.5 MG per tablet Take 1 tablet by mouth daily.      . fluocinolone (SYNALAR) 0.01 % external solution Apply topically 2 (two) times daily.      Marland Kitchen FLUoxetine (PROZAC) 40 MG capsule Take 40 mg by mouth daily.      . fluticasone (CUTIVATE) 0.05 % cream Apply topically 2 (two) times daily.      . methylphenidate 54 MG PO CR tablet Take 54 mg by mouth every morning.      . nystatin-triamcinolone (MYCOLOG II) cream Apply 1 application topically 2 (two) times daily.      . travoprost, benzalkonium, (TRAVATAN) 0.004 % ophthalmic solution 1 drop at bedtime.      . traZODone (DESYREL) 100 MG tablet Take 100 mg by  mouth.      . zolpidem (AMBIEN) 10 MG tablet Take 10 mg by mouth at bedtime as needed for sleep.       No current facility-administered medications for this encounter.     REVIEW OF SYSTEMS:  Pertinent items are noted in HPI.    PHYSICAL EXAM: Alert and oriented 58 year old white female appearing her stated age. Wt Readings from Last 3 Encounters:  08/11/14 149 lb 1.6 oz (67.631 kg)  06/17/09 125 lb (56.7 kg)   Temp Readings from Last 3 Encounters:  08/11/14 98.3 F (36.8 C) Oral   BP Readings from Last 3 Encounters:  08/11/14 141/78  06/17/09 130/90   Pulse Readings from Last 3 Encounters:  08/11/14 75  06/17/09 80    Head and neck examination: Grossly unremarkable. Nodes: Without palpable cervical, supraclavicular, or axillary lymphadenopathy. Chest: Lungs clear. Breasts: There is a 2- 3 cm palpable mass along the superior aspect of the right breast within the upper inner quadrant at 12:30. No other masses are appreciated. Left breast without masses or lesions. Extremities: Without edema.    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 She is felt to be a candidate for neoadjuvant chemotherapy. She is a candidate for breast preservation. We discussed the potential acute and late toxicities of radiation therapy including fibrosis of her implant. I can see her following her definitive surgery with Dr. Excell Seltzer.   PLAN: As discussed above  I spent 30 minutes minutes face to face with the patient and more than 50% of that time was spent in counseling and/or coordination of care.

## 2014-08-12 ENCOUNTER — Encounter (HOSPITAL_COMMUNITY): Payer: Self-pay | Admitting: Pharmacy Technician

## 2014-08-12 ENCOUNTER — Encounter (HOSPITAL_COMMUNITY): Payer: Self-pay | Admitting: *Deleted

## 2014-08-12 MED ORDER — CEFAZOLIN SODIUM-DEXTROSE 2-3 GM-% IV SOLR
2.0000 g | INTRAVENOUS | Status: AC
  Administered 2014-08-13: 2 g via INTRAVENOUS
  Filled 2014-08-12: qty 50

## 2014-08-13 ENCOUNTER — Ambulatory Visit (HOSPITAL_COMMUNITY): Payer: BC Managed Care – PPO

## 2014-08-13 ENCOUNTER — Encounter (HOSPITAL_COMMUNITY): Payer: BC Managed Care – PPO | Admitting: Anesthesiology

## 2014-08-13 ENCOUNTER — Encounter: Payer: Self-pay | Admitting: General Practice

## 2014-08-13 ENCOUNTER — Ambulatory Visit (HOSPITAL_COMMUNITY)
Admission: RE | Admit: 2014-08-13 | Discharge: 2014-08-13 | Disposition: A | Discharge status: Home / Self Care | Payer: BC Managed Care – PPO | Source: Ambulatory Visit | Attending: General Surgery | Admitting: General Surgery

## 2014-08-13 ENCOUNTER — Encounter (HOSPITAL_COMMUNITY)
Admission: RE | Disposition: A | Discharge status: Home / Self Care | Payer: Self-pay | Source: Ambulatory Visit | Attending: General Surgery

## 2014-08-13 ENCOUNTER — Telehealth: Payer: Self-pay | Admitting: Oncology

## 2014-08-13 ENCOUNTER — Ambulatory Visit (HOSPITAL_COMMUNITY): Payer: BC Managed Care – PPO | Admitting: Anesthesiology

## 2014-08-13 DIAGNOSIS — C50919 Malignant neoplasm of unspecified site of unspecified female breast: Secondary | ICD-10-CM

## 2014-08-13 DIAGNOSIS — Z95828 Presence of other vascular implants and grafts: Secondary | ICD-10-CM

## 2014-08-13 DIAGNOSIS — C50911 Malignant neoplasm of unspecified site of right female breast: Secondary | ICD-10-CM | POA: Insufficient documentation

## 2014-08-13 HISTORY — DX: Shortness of breath: R06.02

## 2014-08-13 HISTORY — DX: Inflammatory liver disease, unspecified: K75.9

## 2014-08-13 HISTORY — DX: Nausea with vomiting, unspecified: R11.2

## 2014-08-13 HISTORY — DX: Headache, unspecified: R51.9

## 2014-08-13 HISTORY — DX: Malignant (primary) neoplasm, unspecified: C80.1

## 2014-08-13 HISTORY — DX: Gastro-esophageal reflux disease without esophagitis: K21.9

## 2014-08-13 HISTORY — PX: PORTACATH PLACEMENT: SHX2246

## 2014-08-13 HISTORY — DX: Headache: R51

## 2014-08-13 HISTORY — DX: Other specified postprocedural states: Z98.890

## 2014-08-13 SURGERY — INSERTION, TUNNELED CENTRAL VENOUS DEVICE, WITH PORT
Anesthesia: Choice | Site: Chest

## 2014-08-13 MED ORDER — HYDROMORPHONE HCL 1 MG/ML IJ SOLN
INTRAMUSCULAR | Status: AC
  Filled 2014-08-13: qty 1

## 2014-08-13 MED ORDER — CHLORHEXIDINE GLUCONATE 4 % EX LIQD: 1.0000 "application " | Freq: Once | CUTANEOUS | Status: DC

## 2014-08-13 MED ORDER — BUPIVACAINE-EPINEPHRINE 0.25% -1:200000 IJ SOLN
INTRAMUSCULAR | Status: DC | PRN
  Administered 2014-08-13: 7 mL

## 2014-08-13 MED ORDER — ARTIFICIAL TEARS OP OINT
TOPICAL_OINTMENT | OPHTHALMIC | Status: DC | PRN
  Administered 2014-08-13: 1 via OPHTHALMIC

## 2014-08-13 MED ORDER — HEPARIN SOD (PORK) LOCK FLUSH 100 UNIT/ML IV SOLN
INTRAVENOUS | Status: DC | PRN
  Administered 2014-08-13: 500 [IU] via INTRAVENOUS

## 2014-08-13 MED ORDER — BUPIVACAINE-EPINEPHRINE (PF) 0.25% -1:200000 IJ SOLN
INTRAMUSCULAR | Status: AC
  Filled 2014-08-13: qty 30

## 2014-08-13 MED ORDER — MIDAZOLAM HCL 2 MG/2ML IJ SOLN
INTRAMUSCULAR | Status: AC
  Filled 2014-08-13: qty 2

## 2014-08-13 MED ORDER — PROMETHAZINE HCL 25 MG/ML IJ SOLN
INTRAMUSCULAR | Status: DC
Start: 2014-08-13 — End: 2014-08-13
  Filled 2014-08-13: qty 1

## 2014-08-13 MED ORDER — LACTATED RINGERS IV SOLN
INTRAVENOUS | Status: DC | PRN
  Administered 2014-08-13: 11:00:00 via INTRAVENOUS

## 2014-08-13 MED ORDER — OXYCODONE HCL 5 MG PO TABS: 5.0000 mg | ORAL_TABLET | Freq: Once | ORAL | Status: DC | PRN

## 2014-08-13 MED ORDER — FENTANYL CITRATE 0.05 MG/ML IJ SOLN
INTRAMUSCULAR | Status: AC
  Filled 2014-08-13: qty 5

## 2014-08-13 MED ORDER — MEPERIDINE HCL 25 MG/ML IJ SOLN: 6.2500 mg | INTRAMUSCULAR | Status: DC | PRN

## 2014-08-13 MED ORDER — PROMETHAZINE HCL 25 MG/ML IJ SOLN
6.2500 mg | Freq: Four times a day (QID) | INTRAMUSCULAR | Status: DC | PRN
  Administered 2014-08-13: 6.25 mg via INTRAVENOUS

## 2014-08-13 MED ORDER — PROPOFOL 10 MG/ML IV BOLUS
INTRAVENOUS | Status: AC
  Filled 2014-08-13: qty 20

## 2014-08-13 MED ORDER — PROPOFOL 10 MG/ML IV BOLUS
INTRAVENOUS | Status: DC | PRN
  Administered 2014-08-13: 30 mg via INTRAVENOUS
  Administered 2014-08-13: 150 mg via INTRAVENOUS

## 2014-08-13 MED ORDER — HYDROMORPHONE HCL 1 MG/ML IJ SOLN
0.2500 mg | INTRAMUSCULAR | Status: DC | PRN
  Administered 2014-08-13 (×2): 0.5 mg via INTRAVENOUS

## 2014-08-13 MED ORDER — ONDANSETRON HCL 4 MG/2ML IJ SOLN
4.0000 mg | Freq: Once | INTRAMUSCULAR | Status: AC | PRN
  Administered 2014-08-13: 4 mg via INTRAVENOUS

## 2014-08-13 MED ORDER — HEPARIN SOD (PORK) LOCK FLUSH 100 UNIT/ML IV SOLN
INTRAVENOUS | Status: AC
  Filled 2014-08-13: qty 5

## 2014-08-13 MED ORDER — SODIUM CHLORIDE 0.9 % IR SOLN
Status: DC | PRN
  Administered 2014-08-13: 12:00:00

## 2014-08-13 MED ORDER — FENTANYL CITRATE 0.05 MG/ML IJ SOLN
INTRAMUSCULAR | Status: DC | PRN
  Administered 2014-08-13 (×2): 50 ug via INTRAVENOUS

## 2014-08-13 MED ORDER — LACTATED RINGERS IV SOLN
INTRAVENOUS | Status: DC
  Administered 2014-08-13: 10:00:00 via INTRAVENOUS

## 2014-08-13 MED ORDER — EPHEDRINE SULFATE 50 MG/ML IJ SOLN
INTRAMUSCULAR | Status: DC | PRN
  Administered 2014-08-13: 10 mg via INTRAVENOUS

## 2014-08-13 MED ORDER — HYDROCODONE-ACETAMINOPHEN 5-325 MG PO TABS: 1.0000 | ORAL_TABLET | ORAL | Status: DC | PRN

## 2014-08-13 MED ORDER — LIDOCAINE HCL (CARDIAC) 20 MG/ML IV SOLN
INTRAVENOUS | Status: DC | PRN
  Administered 2014-08-13: 100 mg via INTRAVENOUS

## 2014-08-13 MED ORDER — OXYCODONE HCL 5 MG/5ML PO SOLN: 5.0000 mg | Freq: Once | ORAL | Status: DC | PRN

## 2014-08-13 MED ORDER — ONDANSETRON HCL 4 MG/2ML IJ SOLN
INTRAMUSCULAR | Status: DC | PRN
  Administered 2014-08-13: 4 mg via INTRAVENOUS

## 2014-08-13 MED ORDER — MIDAZOLAM HCL 5 MG/5ML IJ SOLN
INTRAMUSCULAR | Status: DC | PRN
  Administered 2014-08-13: 2 mg via INTRAVENOUS

## 2014-08-13 MED ORDER — ONDANSETRON HCL 4 MG/2ML IJ SOLN
INTRAMUSCULAR | Status: DC
Start: 2014-08-13 — End: 2014-08-13
  Filled 2014-08-13: qty 2

## 2014-08-13 SURGICAL SUPPLY — 55 items
ADH SKN CLS APL DERMABOND .7 (GAUZE/BANDAGES/DRESSINGS) ×1
ADH SKN CLS LQ APL DERMABOND (GAUZE/BANDAGES/DRESSINGS) ×1
BAG DECANTER FOR FLEXI CONT (MISCELLANEOUS) ×2 IMPLANT
CHLORAPREP W/TINT 10.5 ML (MISCELLANEOUS) ×2 IMPLANT
COVER SURGICAL LIGHT HANDLE (MISCELLANEOUS) ×2 IMPLANT
CRADLE DONUT ADULT HEAD (MISCELLANEOUS) ×2 IMPLANT
DECANTER SPIKE VIAL GLASS SM (MISCELLANEOUS) IMPLANT
DERMABOND ADHESIVE PROPEN (GAUZE/BANDAGES/DRESSINGS) ×1
DERMABOND ADVANCED (GAUZE/BANDAGES/DRESSINGS) ×1
DERMABOND ADVANCED .7 DNX12 (GAUZE/BANDAGES/DRESSINGS) ×1 IMPLANT
DERMABOND ADVANCED .7 DNX6 (GAUZE/BANDAGES/DRESSINGS) IMPLANT
DRAPE C-ARM 42X72 X-RAY (DRAPES) ×2 IMPLANT
DRAPE CHEST BREAST 15X10 FENES (DRAPES) ×2 IMPLANT
DRAPE UTILITY 15X26 W/TAPE STR (DRAPE) ×4 IMPLANT
ELECT CAUTERY BLADE 6.4 (BLADE) ×2 IMPLANT
ELECT REM PT RETURN 9FT ADLT (ELECTROSURGICAL) ×2
ELECTRODE REM PT RTRN 9FT ADLT (ELECTROSURGICAL) ×1 IMPLANT
GAUZE SPONGE 4X4 16PLY XRAY LF (GAUZE/BANDAGES/DRESSINGS) ×2 IMPLANT
GLOVE BIOGEL PI IND STRL 8 (GLOVE) ×1 IMPLANT
GLOVE BIOGEL PI INDICATOR 8 (GLOVE) ×1
GLOVE SS BIOGEL STRL SZ 7.5 (GLOVE) ×1 IMPLANT
GLOVE SUPERSENSE BIOGEL SZ 7.5 (GLOVE) ×1
GOWN STRL REUS W/ TWL LRG LVL3 (GOWN DISPOSABLE) ×1 IMPLANT
GOWN STRL REUS W/ TWL XL LVL3 (GOWN DISPOSABLE) ×1 IMPLANT
GOWN STRL REUS W/TWL LRG LVL3 (GOWN DISPOSABLE) ×2
GOWN STRL REUS W/TWL XL LVL3 (GOWN DISPOSABLE) ×2
INTRODUCER COOK 11FR (CATHETERS) IMPLANT
IV CATH 14GX2 1/4 (CATHETERS) IMPLANT
KIT BASIN OR (CUSTOM PROCEDURE TRAY) ×2 IMPLANT
KIT PORT POWER 8FR ISP CVUE (Catheter) ×1 IMPLANT
KIT PORT POWER 9.6FR MRI PREA (Catheter) IMPLANT
KIT PORT POWER ISP 8FR (Catheter) IMPLANT
KIT POWER CATH 8FR (Catheter) IMPLANT
KIT ROOM TURNOVER OR (KITS) ×2 IMPLANT
NDL HYPO 25GX1X1/2 BEV (NEEDLE) ×1 IMPLANT
NEEDLE 22X1 1/2 (OR ONLY) (NEEDLE) ×2 IMPLANT
NEEDLE HYPO 25GX1X1/2 BEV (NEEDLE) ×2 IMPLANT
NS IRRIG 1000ML POUR BTL (IV SOLUTION) ×2 IMPLANT
PACK SURGICAL SETUP 50X90 (CUSTOM PROCEDURE TRAY) ×2 IMPLANT
PAD ARMBOARD 7.5X6 YLW CONV (MISCELLANEOUS) ×4 IMPLANT
PENCIL BUTTON HOLSTER BLD 10FT (ELECTRODE) ×2 IMPLANT
SET INTRODUCER 12FR PACEMAKER (SHEATH) IMPLANT
SET SHEATH INTRODUCER 10FR (MISCELLANEOUS) IMPLANT
SHEATH COOK PEEL AWAY SET 9F (SHEATH) IMPLANT
SUT MON AB 5-0 PS2 18 (SUTURE) ×2 IMPLANT
SUT PROLENE 2 0 SH DA (SUTURE) ×2 IMPLANT
SUT SILK 2 0 (SUTURE)
SUT SILK 2-0 18XBRD TIE 12 (SUTURE) IMPLANT
SYR 20ML ECCENTRIC (SYRINGE) ×4 IMPLANT
SYR 5ML LUER SLIP (SYRINGE) ×2 IMPLANT
SYR BULB 3OZ (MISCELLANEOUS) ×2 IMPLANT
SYR CONTROL 10ML LL (SYRINGE) ×2 IMPLANT
TOWEL OR 17X24 6PK STRL BLUE (TOWEL DISPOSABLE) ×2 IMPLANT
TOWEL OR 17X26 10 PK STRL BLUE (TOWEL DISPOSABLE) ×2 IMPLANT
WATER STERILE IRR 1000ML POUR (IV SOLUTION) IMPLANT

## 2014-08-13 NOTE — Transfer of Care (Signed)
Immediate Anesthesia Transfer of Care Note  Patient: Melissa Garza  Procedure(s) Performed: Procedure(s): INSERTION PORT-A-CATH (N/A)  Patient Location: PACU  Anesthesia Type:General  Level of Consciousness: awake, alert , oriented and patient cooperative  Airway & Oxygen Therapy: Patient Spontanous Breathing and Patient connected to nasal cannula oxygen  Post-op Assessment: Report given to PACU RN and Post -op Vital signs reviewed and stable  Post vital signs: Reviewed and stable  Complications: No apparent anesthesia complications

## 2014-08-13 NOTE — Progress Notes (Signed)
[facility] Psychosocial Distress Screening Spiritual Care  Visited with Melissa Garza, husband Fritz Pickerel, and DIL to introduce [name] and to review distress screening per protocol.  Accompanied by Counseling Intern Elson Clan.  The patient scored a 9 on the Psychosocial Distress Thermometer which indicates severe distress. Chaplain and Counseling Intern assessed for distress and other psychosocial needs.   ONCBCN DISTRESS SCREENING 08/13/2014  Screening Type Initial Screening  Elta [place] the number that describes how much distress you have been experiencing in the past week 9  Practical problem type Insurance;Work/school  Emotional problem type Depression;Nervousness/Anxiety;Adjusting to illness;Adjusting to appearance changes  Information Concerns Type Lack of info about treatment;Lack of info about complementary therapy choices;Lack of info about maintaining fitness  Physical Problem type Breathing; Constipation/diarrhea; Skin dry/itchy  Referral to clinical social work Yes  Referral to support programs Yes  Other [place], Chaplain Intern   Explored [name]'s feelings and coping related to illness.  Per pt, she has long hx depression and sees a psychiatrist every six months for medication, but does not see a counselor for complementary support.  Per pt, her anxiety and distress have increased significantly with dx.  Pt notes that husband Fritz Pickerel is a dedicated supporter and "does everything" at home.  Counseling Intern   Follow up needed: Yes.    If yes, follow up plan:  Pt and Counseling Intern plan for intern to f/u by phone to offer further support/set up appt.  [place], [place]

## 2014-08-13 NOTE — Discharge Instructions (Signed)
    PORT-A-CATH: POST OP INSTRUCTIONS  Always review your discharge instruction sheet given to you by the facility where your surgery was performed.   1. A prescription for pain medication may be given to you upon discharge. Take your pain medication as prescribed, if needed. If narcotic pain medicine is not needed, then you make take acetaminophen (Tylenol) or ibuprofen (Advil) as needed.  2. Take your usually prescribed medications unless otherwise directed. 3. If you need a refill on your pain medication, please contact our office. All narcotic pain medicine now requires a paper prescription.  Phoned in and fax refills are no longer allowed by law.  Prescriptions will not be filled after 5 pm or on weekends.  4. You should follow a light diet for the remainder of the day after your procedure. 5. Most patients will experience some mild swelling and/or bruising in the area of the incision. It may take several days to resolve. 6. It is common to experience some constipation if taking pain medication after surgery. Increasing fluid intake and taking a stool softener (such as Colace) will usually help or prevent this problem from occurring. A mild laxative (Milk of Magnesia or Miralax) should be taken according to package directions if there are no bowel movements after 48 hours.  7. Unless discharge instructions indicate otherwise, you may remove your bandages 48 hours after surgery, and you may shower at that time. You may have steri-strips (small white skin tapes) in place directly over the incision.  These strips should be left on the skin for 7-10 days.  If your surgeon used Dermabond (skin glue) on the incision, you may shower in 24 hours.  The glue will flake off over the next 2-3 weeks.  8. If your port is left accessed at the end of surgery (needle left in port), the dressing cannot get wet and should only by changed by a healthcare professional. When the port is no longer accessed (when the  needle has been removed), follow step 7.   9. ACTIVITIES:  Limit activity involving your arms for the next 72 hours. Do no strenuous exercise or activity for 1 week. You may drive when you are no longer taking prescription pain medication, you can comfortably wear a seatbelt, and you can maneuver your car. 10.You may need to see your doctor in the office for a follow-up appointment.  Please       check with your doctor.  11.When you receive a new Port-a-Cath, you will get a product guide and        ID card.  Please keep them in case you need them.  WHEN TO CALL YOUR DOCTOR ([phone]): 1. Fever over 101.0 2. Chills 3. Continued bleeding from incision 4. Increased redness and tenderness at the site 5. Shortness of breath, difficulty breathing   The clinic staff is available to answer your questions during regular business hours. Please don't hesitate to call and ask to speak to one of the nurses or medical assistants for clinical concerns. If you have a medical emergency, go to the nearest emergency room or call 911.  A surgeon from [facility] is always on call at the hospital.     For further information, please visit [email or web address]      

## 2014-08-13 NOTE — Op Note (Signed)
Preoperative diagnosis: Cancer of the breast and the poor venous access  Postoperative diagnosis: Same  Procedure: Placement of ClearVue subcutaneous venous port  Surgeon: Excell Seltzer M.D.  Anesthesia: LMA general  Description of procedure: Patient is brought to the operating room and placed in the supine position on the operating table. IV sedation was administered. The entire upper chest and neck were widely sterilely prepped and draped. Local anesthesia was used to infiltrate the insertion of port site. The left subclavian vein was cannulated with a needle and guidewire without difficulty and position in the superior vena cava was confirmed by fluoroscopy. The introducer was then placed over the guidewire and the flushed catheter placed via the introducer which was stripped away and the tip of the catheter positioned near the cavoatrial junction. A small transverse incision was made in the anterior chest wall and subcutaneous pocket created. The catheter was tunneled into the pocket, trimmed to length, and attached to the flushed port which was positioned in the pocket. The port was sutured to the chest wall with interrupted 2-0 Prolene. The incisions were closed with subcutaneous interrupted Monocryl and the skin incisions closed with subcuticular Monocryl and Dermabond. The port was accessed and flushed and aspirated easily and was left flushed with concentrated heparin solution. Sponge needle as the counts were correct. The patient was taken to recovery in good condition.  Ally Knodel T  08/13/2014

## 2014-08-13 NOTE — Anesthesia Postprocedure Evaluation (Signed)
Anesthesia Post Note  Patient: Melissa Garza  Procedure(s) Performed: Procedure(s) (LRB): INSERTION PORT-A-CATH (N/A)  Anesthesia type: general  Patient location: PACU  Post pain: Pain level controlled  Post assessment: Patient's Cardiovascular Status Stable  Last Vitals:  Filed Vitals:   08/13/14 1455  BP:   Pulse:   Temp: 36.7 C  Resp:     Post vital signs: Reviewed and stable  Level of consciousness: sedated  Complications: No apparent anesthesia complications

## 2014-08-13 NOTE — H&P (Signed)
History of Present Illness Melissa Garza T. Katleen Carraway MD; 08/11/2014 3:38 PM) The patient is a 58 year old female who presents with breast cancer. [name] is a post-menopausal female referred by Dr. Shelly Bombard for evaluation of recently diagnosed carcinoma of the right breast. She recently Discovered a mass in her right breast on self exam. She felt like it came up quite suddenly. She contacted her family physician and was referred to the breast center for evaluation.. Subsequent imaging included diagnostic mamogram showing An irregular spiculated mass measuring 2.5 cm at the 12:30 position of the right breast. and ultrasound showing A hypoechoic mass at the 12:30 position 7 cm from the nipple measuring in greatest dimension 2.5 cm.. An ultrasound guided breast biopsy was performed on 08/09/2014 with pathology revealing invasive ductal carcinoma and DCIS of the breast. She is seen now in [facility] for initial treatment planning. She has experienced a lump, no skin changes or nipple discharge. She does not have a personal history of any previous breast problems. Findings at that time were the following: Tumor size: 2.5 cm Tumor grade: III Estrogen Receptor: NEG Progesterone Receptor: NEG Her-2 neu: NEG Lymph node status: NEG   Other Problems Marlowe Melissa Garza; 08/11/2014 8:55 AM) No pertinent past medical history  Diagnostic Studies History Melissa Garza; 08/11/2014 8:55 AM) Colonoscopy 1-5 years ago Mammogram within last year Pap Smear 1-5 years ago  Social History Marlowe Melissa Garza; 08/11/2014 8:55 AM) Alcohol use Occasional alcohol use. Caffeine use Carbonated beverages, Coffee. No drug use Tobacco use Never smoker.  Family History Melissa Garza; 08/11/2014 8:55 AM) Hypertension Father, Son.  Pregnancy / Birth History Melissa Garza; 08/11/2014 8:55 AM) Age at menarche 52 years. Contraceptive History Oral contraceptives. Gravida 4 Irregular periods Maternal age 38-25 Para  2  Review of Systems Melissa Garza; 08/11/2014 8:55 AM) General Present- Weight Gain. Not Present- Appetite Loss, Chills, Fatigue, Fever, Night Sweats and Weight Loss. Skin Present- Rash. Not Present- Change in Wart/Mole, Dryness, Hives, Jaundice, New Lesions, Non-Healing Wounds and Ulcer. HEENT Present- Sinus Pain. Not Present- Earache, Hearing Loss, Hoarseness, Nose Bleed, Oral Ulcers, Ringing in the Ears, Seasonal Allergies, Sore Throat, Visual Disturbances, Wears glasses/contact lenses and Yellow Eyes. Respiratory Not Present- Bloody sputum, Chronic Cough, Difficulty Breathing, Snoring and Wheezing. Cardiovascular Present- Shortness of Breath. Not Present- Chest Pain, Difficulty Breathing Lying Down, Leg Cramps, Palpitations, Rapid Heart Rate and Swelling of Extremities. Gastrointestinal Present- Constipation. Not Present- Abdominal Pain, Bloating, Bloody Stool, Change in Bowel Habits, Chronic diarrhea, Difficulty Swallowing, Excessive gas, Gets full quickly at meals, Hemorrhoids, Indigestion, Nausea, Rectal Pain and Vomiting. Female Genitourinary Not Present- Frequency, Nocturia, Painful Urination, Pelvic Pain and Urgency. Musculoskeletal Not Present- Back Pain, Joint Pain, Joint Stiffness, Muscle Pain, Muscle Weakness and Swelling of Extremities. Neurological Present- Headaches. Not Present- Decreased Memory, Fainting, Numbness, Seizures, Tingling, Tremor, Trouble walking and Weakness. Psychiatric Present- Anxiety and Depression. Not Present- Bipolar, Change in Sleep Pattern, Fearful and Frequent crying. Endocrine Not Present- Cold Intolerance, Excessive Hunger, Hair Changes, Heat Intolerance, Hot flashes and New Diabetes. Hematology Not Present- Easy Bruising, Excessive bleeding, Gland problems, HIV and Persistent Infections.   Physical Exam Melissa Garza T. Melissa Bouffard MD; 08/11/2014 3:39 PM) The physical exam findings are as follows: Note:General: Alert, well-developed and well nourished  Caucasin female, in no distress Skin: Warm and dry without rash or infection. HEENT: No palpable masses or thyromegaly. Sclera nonicteric. Pupils equal round and reactive. Oropharynx clear. Lymph nodes: No cervical, supraclavicular, or inguinal nodes palpable. Breasts: Healed circumareolar incisions from mammoplasty. There is a  firm approximately 2-3 cm mass palpable in the upper inner right breast. No other palpable masses or skin changes or nipple inversion. No palpable axillary adenopathy. Lungs: Breath sounds clear and equal. No wheezing or increased work of breathing. Cardiovascular: Regular rate and rhythm without murmer. No JVD or edema. Peripheral pulses intact. No carotid bruits. Abdomen: Nondistended. Soft and nontender. No masses palpable. No organomegaly. No palpable hernias. Extremities: No edema or joint swelling or deformity. No chronic venous stasis changes. Neurologic: Alert and fully oriented. Gait normal. No focal weakness. Psychiatric: Normal mood and affect. Thought content appropriate with normal judgement and insight    Assessment & Plan Melissa Garza T. [name]; 08/11/2014 3:43 PM) BREAST CANCER, RIGHT (174.9  C50.911) Impression: 58 year old female with a new diagnosis of cancer of the right breast, upper inner quadrant. Clinical stage IIA, triple negative. I discussed with the patient and family members present today initial surgical treatment options. We discussed options of breast conservation with lumpectomy or total mastectomy and sentinal lymph node biopsy/dissection. Options for reconstruction were discussed. As she is triple negative and will require chemotherapy after discussion with medical and radiation oncology we will plan to proceed with neoadjuvant chemotherapy which should make breast conservation easier and cosmetically superior. This rationale was discussed with the patient who is in agreement. She does desire breast conservation. We discussed Port-A-Cath  placement and its indications as well as risks of anesthetic complications, bleeding, infection, pneumothorax, thrombosis catheter malfunction or displacement. She agrees we will go ahead and schedule her for Port-A-Cath placement as soon as possible. Current Plans  Schedule for Surgery: Port-A-Cath placement as an outpatient

## 2014-08-13 NOTE — Telephone Encounter (Signed)
LMONVM FOR PT RE APPT FOR 10/13 LC AND CHED. PT TO GET SCHEDULE WHEN SHE COMES IN. ECHO TO [place].

## 2014-08-13 NOTE — Anesthesia Preprocedure Evaluation (Addendum)
Anesthesia Evaluation  Patient identified by MRN, date of birth, ID band Patient awake    Reviewed: Allergy & Precautions, H&P , NPO status , Patient's Chart, lab work & pertinent test results  History of Anesthesia Complications (+) PONV and history of anesthetic complications  Airway Mallampati: I TM Distance: >3 FB Neck ROM: Full    Dental  (+) Teeth Intact   Pulmonary          Cardiovascular     Neuro/Psych  Headaches,    GI/Hepatic GERD-  Medicated and Controlled,(+) Hepatitis -  Endo/Other    Renal/GU      Musculoskeletal   Abdominal   Peds  Hematology   Anesthesia Other Findings   Reproductive/Obstetrics                          Anesthesia Physical Anesthesia Plan  ASA: II  Anesthesia Plan: General   Post-op Pain Management:    Induction: Intravenous  Airway Management Planned: LMA  Additional Equipment:   Intra-op Plan:   Post-operative Plan: Extubation in OR  Informed Consent: I have reviewed the patients History and Physical, chart, labs and discussed the procedure including the risks, benefits and alternatives for the proposed anesthesia with the patient or authorized representative who has indicated his/her understanding and acceptance.     Plan Discussed with: CRNA and Surgeon  Anesthesia Plan Comments:        Anesthesia Quick Evaluation

## 2014-08-16 ENCOUNTER — Encounter (HOSPITAL_COMMUNITY): Payer: Self-pay | Admitting: General Surgery

## 2014-08-16 ENCOUNTER — Telehealth: Payer: Self-pay | Admitting: Oncology

## 2014-08-16 ENCOUNTER — Telehealth: Payer: Self-pay | Admitting: *Deleted

## 2014-08-16 NOTE — Telephone Encounter (Signed)
s/w pt re echo for 10/19. per pre auth dept - no preauth needed.

## 2014-08-16 NOTE — Telephone Encounter (Signed)
Left vm for pt to return call regarding [place] from 08/11/14. Contact information given.

## 2014-08-17 ENCOUNTER — Ambulatory Visit (HOSPITAL_BASED_OUTPATIENT_CLINIC_OR_DEPARTMENT_OTHER): Payer: BC Managed Care – PPO | Admitting: Adult Health

## 2014-08-17 ENCOUNTER — Other Ambulatory Visit: Payer: BC Managed Care – PPO

## 2014-08-17 ENCOUNTER — Encounter: Payer: Self-pay | Admitting: *Deleted

## 2014-08-17 ENCOUNTER — Telehealth: Payer: Self-pay | Admitting: Adult Health

## 2014-08-17 ENCOUNTER — Encounter: Payer: Self-pay | Admitting: Oncology

## 2014-08-17 VITALS — BP 140/79 | HR 82 | Temp 97.0°F | Resp 18 | Ht 61.5 in | Wt 148.6 lb

## 2014-08-17 DIAGNOSIS — C50211 Malignant neoplasm of upper-inner quadrant of right female breast: Secondary | ICD-10-CM

## 2014-08-17 DIAGNOSIS — M7989 Other specified soft tissue disorders: Secondary | ICD-10-CM

## 2014-08-17 DIAGNOSIS — Z171 Estrogen receptor negative status [ER-]: Secondary | ICD-10-CM

## 2014-08-17 NOTE — Telephone Encounter (Signed)
per pof to sch pt-sch all appt except 10/27-gave to [facility] to review for another date-adv pt gave pt copy of sch-pt aware

## 2014-08-17 NOTE — Progress Notes (Signed)
[place]  [phone] [phone]     ID: Melissa Garza DOB: [birth year]  MR#: [id number]  DGL#:[id number]  Patient Care Team: No Pcp Per Patient as PCP - General (General Practice) Gus Height, MD as Consulting Physician (Obstetrics and Gynecology) Excell Seltzer, MD as Consulting Physician (General Surgery) Chauncey Cruel, MD as Consulting Physician (Oncology) Rexene Edison, MD as Consulting Physician (Radiation Oncology) OTHER MD: Chucky May MD  CHIEF COMPLAINT: Triple negative breast cancer  CURRENT TREATMENT: Neoadjuvant chemotherapy   BREAST CANCER HISTORY: [name] herself palpated a mass in her right breast approximately mid-September. She brought this to her gynecologist attention and he set her up for bilateral diagnostic mammography at the breast center 08/04/2014. The breast density was category C. in the patient has bilateral saline implants in place. In the palpable area of concern in the right breast there was an irregular mass measuring up to 2.5 cm. By palpation this was firm at the 12:30 o'clock position. Ultrasound of the right breast confirmed an irregular hypoechoic mass in this area measuring 2.5 cm. There was no right axillary lymphadenopathy noted.  Biopsy of the mass in question 08/04/2014 showed ([id number]) invasive ductal carcinoma, grade 3, triple negative, with an MIB-1 of 90%.  Bilateral breast MRI 08/09/2014 showed in the upper inner quadrant of the right breast an irregular enhancing mass measuring 2.8 cm. There was a 4 mm satellite nodule anterior to this and separated from that by 0.6 cm. The left breast, the remaining of the right breast and the lymph node areas were negative.  The patient's subsequent history is as detailed below  INTERVAL HISTORY: Melissa Garza was evaluated today for chemotherapy discussion accompanied by her husband [name].  She is doing well today.  She did have her port placed this past  Friday.  She is having pain and swelling in her left upper arm.  This started today.  She has a h/o psoriasis and wants to know how it is going to be affected by treatment.  She denies fevers, chills, nausea, vomiting, constipation, diarrhea, numbness/tingling or any other concerns.   REVIEW OF SYSTEMS: A 10 point review of systems was conducted and is otherwise negative except for what is noted above.     PAST MEDICAL HISTORY: Past Medical History  Diagnosis Date  . Glaucoma   . Anxiety   . Hot flashes   . Depression   . PONV (postoperative nausea and vomiting)   . Headache     chronic  . Shortness of breath     with exertion  . GERD (gastroesophageal reflux disease)     PMH  . Cancer     cancer of right breast  . Hepatitis     Hx: of Hepatitis not sure which one    PAST SURGICAL HISTORY: Past Surgical History  Procedure Laterality Date  . Tonsilectomy/adenoidectomy with myringotomy    . Breast enhancement surgery  2008  . Eye surgery      Lasik  . Colonoscopy    . Breast surgery      Biopsy  . Dilation and curettage of uterus    . Tubal ligation    . Portacath placement N/A 08/13/2014    Procedure: INSERTION PORT-A-CATH;  Surgeon: Excell Seltzer, MD;  Location: [facility] OR;  Service: General;  Laterality: N/A;    FAMILY HISTORY Family History  Problem Relation Age of Onset  . Psoriasis Mother   . Hypertension Father   . Asthma Other   .  Thyroid disease Other    The patient's parents are currently alive, in their late 72s. The patient had no brothers, 2 sisters. There is no history of breast or ovarian cancer in the family to her knowledge.   GYNECOLOGIC HISTORY:  No LMP recorded. Patient is postmenopausal. Menarche age 58, first live birth age 58. She is GX P2. She stopped having periods in 2005 and started hormone replacement at that time. 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 Her husband, [name], is disabled secondary to multiple cardiac problems. Son [name] this and [name] and works in apartment maintenance. Son Sonia Side me [name] is his wife) works as an Clinical biochemist. The patient has 6 grandchildren. She is not a Ambulance person.    ADVANCED DIRECTIVES: Not in place   HEALTH MAINTENANCE: History  Substance Use Topics  . Smoking status: Never Smoker   . Smokeless tobacco: Never Used  . Alcohol Use: No     Colonoscopy:Remote  PAP:2014  Bone density:Never  Lipid panel:  No Known Allergies  Current Outpatient Prescriptions  Medication Sig Dispense Refill  . acetaminophen (TYLENOL) 500 MG tablet Take 1,000 mg by mouth every 6 (six) hours as needed for mild pain.      Marland Kitchen ALPRAZolam (XANAX) 1 MG tablet Take 1 mg by mouth 4 (four) times daily as needed for anxiety.      . Armodafinil (NUVIGIL) 250 MG tablet Take 250 mg by mouth once a week.       . benzonatate (TESSALON) 200 MG capsule Take 200 mg by mouth 3 (three) times daily as needed for cough.      . butalbital-acetaminophen-caffeine (FIORICET, ESGIC) 50-325-40 MG per tablet Take 1 tablet by mouth 2 (two) times daily as needed for headache.       . CLOBETASOL PROPIONATE EX Apply 1 application topically daily as needed (DRY SCALP).      Marland Kitchen estradiol-norethindrone (ACTIVELLA) 1-0.5 MG per tablet Take 1 tablet by mouth daily.      . Fluocinolone Acetonide (DERMA-SMOOTHE/FS SCALP) 0.01 % OIL Apply 1 application topically daily as needed (DRY SCALP).      Marland Kitchen FLUoxetine (PROZAC) 40 MG capsule Take 40 mg by mouth daily.      . fluticasone (CUTIVATE) 0.05 % cream Apply 1 application topically daily as needed (psoriasis).       Marland Kitchen HYDROcodone-acetaminophen (NORCO/VICODIN) 5-325 MG per tablet Take 1-2 tablets by mouth every 4 (four) hours as needed for moderate pain or severe pain.  40 tablet  0  . methylphenidate 54 MG PO CR tablet Take 54 mg by mouth every  morning.      . nystatin-triamcinolone (MYCOLOG II) cream Apply 1 application topically daily as needed.       . Polyvinyl Alcohol-Povidone (REFRESH OP) Place 2 drops into both eyes daily as needed (ALLERGIES).      Marland Kitchen travoprost, benzalkonium, (TRAVATAN) 0.004 % ophthalmic solution Place 1 drop into both eyes at bedtime.       . traZODone (DESYREL) 50 MG tablet Take 50 mg by mouth at bedtime as needed for sleep.      Marland Kitchen zolpidem (AMBIEN) 10 MG tablet Take 10 mg by mouth at bedtime as needed for sleep.       No current facility-administered medications for this visit.    OBJECTIVE: Middle-aged white woman who appears stated  age  58 Vitals:   08/17/14 1524  BP: 140/79  Pulse: 82  Temp: 97 F (36.1 C)  Resp: 18     Body mass index is 27.63 kg/(m^2).    ECOG FS:1 - Symptomatic but completely ambulatory  GENERAL: Patient is a well appearing female in no acute distress HEENT:  Sclerae anicteric.  Oropharynx clear and moist. No ulcerations or evidence of oropharyngeal candidiasis. Neck is supple.  NODES:  No cervical, supraclavicular, or axillary lymphadenopathy palpated.  BREAST EXAM:  Deferred. LUNGS:  Clear to auscultation bilaterally.  No wheezes or rhonchi. HEART:  Regular rate and rhythm. No murmur appreciated. ABDOMEN:  Soft, nontender.  Positive, normoactive bowel sounds. No organomegaly palpated. MSK:  No focal spinal tenderness to palpation. Full range of motion bilaterally in the upper extremities. EXTREMITIES:  Left arm with mild swelling SKIN:  Clear with no obvious rashes or skin changes. No nail dyscrasia. NEURO:  Nonfocal. Well oriented.  Appropriate affect.     LAB RESULTS:  CMP     Component Value Date/Time   NA 140 08/11/2014 1132   NA 143 06/17/2009 1137   K 3.9 08/11/2014 1132   K 4.7 06/17/2009 1137   CL 114* 06/17/2009 1137   CO2 27 08/11/2014 1132   CO2 24 06/17/2009 1137   GLUCOSE 91 08/11/2014 1132   GLUCOSE 97 06/17/2009 1137   GLUCOSE 81 10/22/2006 1253     BUN 12.1 08/11/2014 1132   BUN 16 06/17/2009 1137   CREATININE 0.8 08/11/2014 1132   CREATININE 1.2 06/17/2009 1137   CALCIUM 8.7 08/11/2014 1132   CALCIUM 9.4 06/17/2009 1137   PROT 6.4 08/11/2014 1132   PROT 7.0 06/17/2009 1137   ALBUMIN 3.3* 08/11/2014 1132   ALBUMIN 3.9 06/17/2009 1137   AST 11 08/11/2014 1132   AST 15 06/17/2009 1137   ALT 11 08/11/2014 1132   ALT 13 06/17/2009 1137   ALKPHOS 84 08/11/2014 1132   ALKPHOS 55 06/17/2009 1137   BILITOT 0.25 08/11/2014 1132   BILITOT 0.7 06/17/2009 1137   GFRNONAA 49.82 06/17/2009 1137    I No results found for this basename: SPEP,  UPEP,   kappa and lambda light chains    Lab Results  Component Value Date   WBC 6.4 08/11/2014   NEUTROABS 2.9 08/11/2014   HGB 11.9 08/11/2014   HCT 35.9 08/11/2014   MCV 96.8 08/11/2014   PLT 198 08/11/2014      Chemistry      Component Value Date/Time   NA 140 08/11/2014 1132   NA 143 06/17/2009 1137   K 3.9 08/11/2014 1132   K 4.7 06/17/2009 1137   CL 114* 06/17/2009 1137   CO2 27 08/11/2014 1132   CO2 24 06/17/2009 1137   BUN 12.1 08/11/2014 1132   BUN 16 06/17/2009 1137   CREATININE 0.8 08/11/2014 1132   CREATININE 1.2 06/17/2009 1137      Component Value Date/Time   CALCIUM 8.7 08/11/2014 1132   CALCIUM 9.4 06/17/2009 1137   ALKPHOS 84 08/11/2014 1132   ALKPHOS 55 06/17/2009 1137   AST 11 08/11/2014 1132   AST 15 06/17/2009 1137   ALT 11 08/11/2014 1132   ALT 13 06/17/2009 1137   BILITOT 0.25 08/11/2014 1132   BILITOT 0.7 06/17/2009 1137       No results found for this basename: LABCA2    No components found with this basename: LABCA125    No results found for this basename: INR,  in the  last 168 hours  Urinalysis No results found for this basename: colorurine,  appearanceur,  labspec,  phurine,  glucoseu,  hgbur,  bilirubinur,  ketonesur,  proteinur,  urobilinogen,  nitrite,  leukocytesur    STUDIES: Mr Breast Bilateral W Wo Contrast  08/09/2014   CLINICAL DATA:  Recent diagnosis of grade 3  invasive ductal carcinoma and 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  EXAM: BILATERAL BREAST MRI WITH AND WITHOUT CONTRAST  TECHNIQUE: Multiplanar, multisequence MR images of both breasts were obtained prior to and following the intravenous administration of 19ml of MultiHance.  THREE-DIMENSIONAL MR IMAGE RENDERING ON INDEPENDENT WORKSTATION:  Three-dimensional MR images were rendered by post-processing of the original MR data on an independent workstation. The three-dimensional MR images were interpreted, and findings are reported in the following complete MRI report for this study. Three dimensional images were evaluated at the independent DynaCad workstation  COMPARISON:  Recent exams in September 2015.  FINDINGS: Breast composition: c:  Heterogeneous fibroglandular tissue  Background parenchymal enhancement: Moderate  Right breast: There is a subpectoral saline breast implant. In the upper inner quadrant, within the posterior third of the breast parenchyma, is a heterogeneously-enhancing irregular mass with central biopsy clip artifact. The mass measures 2.2 x 2.2 x 2.8 cm and demonstrates washout kinetics. There is a 4 mm satellite nodule 6 mm directly anterior to the lateral margin of the biopsy-proven carcinoma. The remainder of the right breast is negative.  Left breast: Subpectoral saline breast implant is present. No mass or abnormal enhancement.  Lymph nodes: No abnormal appearing internal mammary chain or axillary lymph nodes are dense fat bilaterally.  Ancillary findings:  None.  IMPRESSION: 1. Biopsy proven carcinoma in the upper inner quadrant of the right breast measures 2.2 x 2.2 x 2.8 cm by MRI. Biopsy clip is positioned within the mass. There is a single 4 mm satellite nodule positioned 6 mm anterior to the lateral margin of the biopsy-proven carcinoma.  2.  No MRI evidence of malignancy in the left breast.  3.   Negative for lymphadenopathy.  RECOMMENDATION: Treatment planning for known right breast carcinoma.  BI-RADS CATEGORY  6: Known biopsy-proven malignancy.   Electronically Signed   By: Curlene Dolphin M.D.   On: 08/09/2014 12:55   Mm Digital Diagnostic Unilat R  08/04/2014   CLINICAL DATA:  Post biopsy of a highly suspicious mass in the upper right breast at 10/1929.  EXAM: DIAGNOSTIC RIGHT MAMMOGRAM POST ULTRASOUND BIOPSY  COMPARISON:  Previous exams  FINDINGS: Mammographic images were obtained following ultrasound guided biopsy of a highly suspicious mass in the right breast at 12:30. A heart shaped biopsy marking clip is present in the targeted region of the mass.  IMPRESSION: Appropriate positioning of Heart [id number] shaped biopsy marking clip post biopsy of a highly suspicious mass in the right breast at 12:30.  Final Assessment: Post Procedure Mammograms for Marker Placement   Electronically Signed   By: Everlean Alstrom M.D.   On: 08/04/2014 10:51   US Breast Ltd Uni Right Inc Axilla  08/04/2014   CLINICAL DATA:  58 year old female with a palpable abnormality in the right breast.  EXAM: DIGITAL DIAGNOSTIC  BILATERAL MAMMOGRAM WITH CAD  ULTRASOUND RIGHT BREAST  COMPARISON:  Previous exams.  ACR Breast Density Category c: The breast tissue is heterogeneously dense, which may obscure small masses.  FINDINGS: Bilateral saline implants are present. Standard and implant displaced  views were performed. Spot compression tangential view over the palpable site of concern in the right breast was performed demonstrating an irregular mass measuring up to 2.5 cm. No suspicious masses or calcifications are seen in the left breast.  Mammographic images were processed with CAD.  Physical examination at site of palpable concern reveals a firm mass at the approximate 12:30 position.  Targeted ultrasound of the right breast was performed demonstrating an irregular hypoechoic mass with increased vascularity at 12 30 7  cm from the  nipple measuring 2.2 x 1.3 x 2.5 cm. This corresponds with mammography findings. No definite lymphadenopathy seen in the right axilla.  IMPRESSION: Highly suspicious of right breast mass.  RECOMMENDATION: Ultrasound-guided biopsy of a highly suspicious mass in the upper right breast is recommended. This will subsequently be performed and dictated separately.  I have discussed the findings and recommendations with the patient. Results were also provided in writing at the conclusion of the visit. If applicable, a reminder letter will be sent to the patient regarding the next appointment.  BI-RADS CATEGORY  5: Highly suggestive of malignancy.   Electronically Signed   By: Everlean Alstrom M.D.   On: 08/04/2014 09:57   Mm Diag Breast W/implant Bilateral  08/04/2014   CLINICAL DATA:  58 year old female with a palpable abnormality in the right breast.  EXAM: DIGITAL DIAGNOSTIC  BILATERAL MAMMOGRAM WITH CAD  ULTRASOUND RIGHT BREAST  COMPARISON:  Previous exams.  ACR Breast Density Category c: The breast tissue is heterogeneously dense, which may obscure small masses.  FINDINGS: Bilateral saline implants are present. Standard and implant displaced views were performed. Spot compression tangential view over the palpable site of concern in the right breast was performed demonstrating an irregular mass measuring up to 2.5 cm. No suspicious masses or calcifications are seen in the left breast.  Mammographic images were processed with CAD.  Physical examination at site of palpable concern reveals a firm mass at the approximate 12:30 position.  Targeted ultrasound of the right breast was performed demonstrating an irregular hypoechoic mass with increased vascularity at 12 30 7  cm from the nipple measuring 2.2 x 1.3 x 2.5 cm. This corresponds with mammography findings. No definite lymphadenopathy seen in the right axilla.  IMPRESSION: Highly suspicious of right breast mass.  RECOMMENDATION: Ultrasound-guided biopsy of a highly  suspicious mass in the upper right breast is recommended. This will subsequently be performed and dictated separately.  I have discussed the findings and recommendations with the patient. Results were also provided in writing at the conclusion of the visit. If applicable, a reminder letter will be sent to the patient regarding the next appointment.  BI-RADS CATEGORY  5: Highly suggestive of malignancy.   Electronically Signed   By: Everlean Alstrom M.D.   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 This was found to be concordant by Dr. Andres Shad. Pathology was discussed with the patient by telephone. She reported doing well after the biopsy with tenderness at the site. Post biopsy instructions were reviewed and her questions were answered. She has been scheduled at [facility] on August 11[facility] 2014. A bilateral breast MRI will be scheduled and the patient will be contacted. She was encouraged to come to [facility]  for educational materials. My number was provided for future questions and concerns.  Pathology results reported by Susa Raring RN, BSN on August 05, 2014.   Electronically Signed   By: Everlean Alstrom M.D.   On: 08/05/2014 12:49   08/09/2014   CLINICAL DATA:  58 year old female with a highly suspicious right breast mass.  EXAM: ULTRASOUND GUIDED RIGHT BREAST CORE NEEDLE BIOPSY  COMPARISON:  Previous exams.  PROCEDURE: I met with the patient and we discussed the procedure of ultrasound-guided biopsy, including benefits and alternatives. We discussed the high likelihood of a successful procedure. We discussed the risks of the procedure including infection, bleeding, tissue injury, clip migration, and inadequate sampling. Informed written  consent was given. The usual time-out protocol was performed immediately prior to the procedure.  Using sterile technique and 2% Lidocaine as local anesthetic, under direct ultrasound visualization, a 12 gauge vacuum-assisteddevice was used to perform biopsy of a highly suspicious mass in the right breast at 10/1929 using a lateral approach. At the conclusion of the procedure, a Heart shaped tissue marker clip was deployed into the biopsy cavity. Follow-up 2-view mammogram was performed and dictated separately.  IMPRESSION: Ultrasound-guided biopsy of a highly suspicious mass in the right breast. No apparent complications.  Electronically Signed: By: Everlean Alstrom M.D. On: 08/04/2014 10:28    ASSESSMENT: 58 y.o. [place] woman status post right breast upper inner quadrant biopsy 08/04/2014 for a clinical T2 N0, stage IIA invasive ductal carcinoma, grade 3, triple negative, with an MIB-1 of 90%.  PLAN: I reviewed Liz's chemotherapy regimen with her in detail and signed all of her pre-medications and reviewed how to take these in detail.  She has not yet had her echocardiogram, but this was ordered and is scheduled for next week prior to chemotherapy.  I gave her detailed information about her nausea medication, her chemotherapies, along with Neulasta in her AVS.    For the left arm discomfort and swelling, I ordered a doppler of her left arm to be done.   We spent about 45 minutes discussing all of her treatment and reviewing her schedule and chemotherapies.  She will attend chemo class this afternoon and return next week for labs and chemotherapy.     The patient has a good understanding of the overall plan. She agrees with it. She knows the goal of treatment in her case is cure. She will call with any problems that may develop before her next visit here.   Minette Headland, [place] [phone]    08/17/2014 3:26 PM

## 2014-08-17 NOTE — Progress Notes (Signed)
Faxed fmla form to [facility] @ [phone]

## 2014-08-17 NOTE — Patient Instructions (Signed)
Nausea medication  Decadron/Dexamethasone: take two tablets once the day after chemotherapy, then take two tablets twice a day for two days.    Ondansetron/Zofran: take twice a day as needed starting the third day after chemotherapy  Prochlorperazine/Compazine: Take every 6 hours as needed for nausea  Lorazepam/Ativan: Take every 6 hours as needed for nausea  Take Claritin 10mg  every morning and Tylenol 500mg  or 650mg  every morning starting the day after chemotherapy for 5 days.    Pegfilgrastim injection What is this medicine? PEGFILGRASTIM (peg fil GRA stim) is a long-acting granulocyte colony-stimulating factor that stimulates the growth of neutrophils, a type of white blood cell important in the body's fight against infection. It is used to reduce the incidence of fever and infection in patients with certain types of cancer who are receiving chemotherapy that affects the bone marrow. This medicine may be used for other purposes; ask your health care provider or pharmacist if you have questions. COMMON BRAND NAME(S): Neulasta What should I tell my health care provider before I take this medicine? They need to know if you have any of these conditions: -latex allergy -ongoing radiation therapy -sickle cell disease -skin reactions to acrylic adhesives (On-Body Injector only) -an unusual or allergic reaction to pegfilgrastim, filgrastim, other medicines, foods, dyes, or preservatives -pregnant or trying to get pregnant -breast-feeding How should I use this medicine? This medicine is for injection under the skin. If you get this medicine at home, you will be taught how to prepare and give the pre-filled syringe or how to use the On-body Injector. Refer to the patient Instructions for Use for detailed instructions. Use exactly as directed. Take your medicine at regular intervals. Do not take your medicine more often than directed. It is important that you put your used needles and syringes  in a special sharps container. Do not put them in a trash can. If you do not have a sharps container, call your pharmacist or healthcare provider to get one. Talk to your pediatrician regarding the use of this medicine in children. Special care may be needed. Overdosage: If you think you have taken too much of this medicine contact a poison control center or emergency room at once. NOTE: This medicine is only for you. Do not share this medicine with others. What if I miss a dose? It is important not to miss your dose. Call your doctor or health care professional if you miss your dose. If you miss a dose due to an On-body Injector failure or leakage, a new dose should be administered as soon as possible using a single prefilled syringe for manual use. What may interact with this medicine? Interactions have not been studied. Give your health care provider a list of all the medicines, herbs, non-prescription drugs, or dietary supplements you use. Also tell them if you smoke, drink alcohol, or use illegal drugs. Some items may interact with your medicine. This list may not describe all possible interactions. Give your health care provider a list of all the medicines, herbs, non-prescription drugs, or dietary supplements you use. Also tell them if you smoke, drink alcohol, or use illegal drugs. Some items may interact with your medicine. What should I watch for while using this medicine? You may need blood work done while you are taking this medicine. If you are going to need a MRI, CT scan, or other procedure, tell your doctor that you are using this medicine (On-Body Injector only). What side effects may I notice from receiving this  medicine? Side effects that you should report to your doctor or health care professional as soon as possible: -allergic reactions like skin rash, itching or hives, swelling of the face, lips, or tongue -dizziness -fever -pain, redness, or irritation at site where  injected -pinpoint red spots on the skin -shortness of breath or breathing problems -stomach or side pain, or pain at the shoulder -swelling -tiredness -trouble passing urine Side effects that usually do not require medical attention (report to your doctor or health care professional if they continue or are bothersome): -bone pain -muscle pain This list may not describe all possible side effects. Call your doctor for medical advice about side effects. You may report side effects to FDA at 1-800-FDA-1088. Where should I keep my medicine? Keep out of the reach of children. Store pre-filled syringes in a refrigerator between 2 and 8 degrees C (36 and 46 degrees F). Do not freeze. Keep in carton to protect from light. Throw away this medicine if it is left out of the refrigerator for more than 48 hours. Throw away any unused medicine after the expiration date. NOTE: This sheet is a summary. It may not cover all possible information. If you have questions about this medicine, talk to your doctor, pharmacist, or health care provider.  2015, [employer]. (2014-01-21 16:14:05)   Cyclophosphamide injection What is this medicine? CYCLOPHOSPHAMIDE (sye kloe FOSS fa mide) is a chemotherapy drug. It slows the growth of cancer cells. This medicine is used to treat many types of cancer like lymphoma, myeloma, leukemia, breast cancer, and ovarian cancer, to name a few. This medicine may be used for other purposes; ask your health care provider or pharmacist if you have questions. COMMON BRAND NAME(S): Cytoxan, Neosar What should I tell my health care provider before I take this medicine? They need to know if you have any of these conditions: -blood disorders -history of other chemotherapy -infection -kidney disease -liver disease -recent or ongoing radiation therapy -tumors in the bone marrow -an unusual or allergic reaction to cyclophosphamide, other chemotherapy, other medicines, foods,  dyes, or preservatives -pregnant or trying to get pregnant -breast-feeding How should I use this medicine? This drug is usually given as an injection into a vein or muscle or by infusion into a vein. It is administered in a hospital or clinic by a specially trained health care professional. Talk to your pediatrician regarding the use of this medicine in children. Special care may be needed. Overdosage: If you think you have taken too much of this medicine contact a poison control center or emergency room at once. NOTE: This medicine is only for you. Do not share this medicine with others. What if I miss a dose? It is important not to miss your dose. Call your doctor or health care professional if you are unable to keep an appointment. What may interact with this medicine? This medicine may interact with the following medications: -amiodarone -amphotericin B -azathioprine -certain antiviral medicines for HIV or AIDS such as protease inhibitors (e.g., indinavir, ritonavir) and zidovudine -certain blood pressure medications such as benazepril, captopril, enalapril, fosinopril, lisinopril, moexipril, monopril, perindopril, quinapril, ramipril, trandolapril -certain cancer medications such as anthracyclines (e.g., daunorubicin, doxorubicin), busulfan, cytarabine, paclitaxel, pentostatin, tamoxifen, trastuzumab -certain diuretics such as chlorothiazide, chlorthalidone, hydrochlorothiazide, indapamide, metolazone -certain medicines that treat or prevent blood clots like warfarin -certain muscle relaxants such as succinylcholine -cyclosporine -etanercept -indomethacin -medicines to increase blood counts like filgrastim, pegfilgrastim, sargramostim -medicines used as general anesthesia -metronidazole -natalizumab This list may  not describe all possible interactions. Give your health care provider a list of all the medicines, herbs, non-prescription drugs, or dietary supplements you use. Also tell  them if you smoke, drink alcohol, or use illegal drugs. Some items may interact with your medicine. What should I watch for while using this medicine? Visit your doctor for checks on your progress. This drug may make you feel generally unwell. This is not uncommon, as chemotherapy can affect healthy cells as well as cancer cells. Report any side effects. Continue your course of treatment even though you feel ill unless your doctor tells you to stop. Drink water or other fluids as directed. Urinate often, even at night. In some cases, you may be given additional medicines to help with side effects. Follow all directions for their use. Call your doctor or health care professional for advice if you get a fever, chills or sore throat, or other symptoms of a cold or flu. Do not treat yourself. This drug decreases your body's ability to fight infections. Try to avoid being around people who are sick. This medicine may increase your risk to bruise or bleed. Call your doctor or health care professional if you notice any unusual bleeding. Be careful brushing and flossing your teeth or using a toothpick because you may get an infection or bleed more easily. If you have any dental work done, tell your dentist you are receiving this medicine. You may get drowsy or dizzy. Do not drive, use machinery, or do anything that needs mental alertness until you know how this medicine affects you. Do not become pregnant while taking this medicine or for 1 year after stopping it. Women should inform their doctor if they wish to become pregnant or think they might be pregnant. Men should not father a child while taking this medicine and for 4 months after stopping it. There is a potential for serious side effects to an unborn child. Talk to your health care professional or pharmacist for more information. Do not breast-feed an infant while taking this medicine. This medicine may interfere with the ability to have a child. This  medicine has caused ovarian failure in some women. This medicine has caused reduced sperm counts in some men. You should talk with your doctor or health care professional if you are concerned about your fertility. If you are going to have surgery, tell your doctor or health care professional that you have taken this medicine. What side effects may I notice from receiving this medicine? Side effects that you should report to your doctor or health care professional as soon as possible: -allergic reactions like skin rash, itching or hives, swelling of the face, lips, or tongue -low blood counts - this medicine may decrease the number of white blood cells, red blood cells and platelets. You may be at increased risk for infections and bleeding. -signs of infection - fever or chills, cough, sore throat, pain or difficulty passing urine -signs of decreased platelets or bleeding - bruising, pinpoint red spots on the skin, black, tarry stools, blood in the urine -signs of decreased red blood cells - unusually weak or tired, fainting spells, lightheadedness -breathing problems -dark urine -dizziness -palpitations -swelling of the ankles, feet, hands -trouble passing urine or change in the amount of urine -weight gain -yellowing of the eyes or skin Side effects that usually do not require medical attention (report to your doctor or health care professional if they continue or are bothersome): -changes in nail or skin color -hair  loss -missed menstrual periods -mouth sores -nausea, vomiting This list may not describe all possible side effects. Call your doctor for medical advice about side effects. You may report side effects to FDA at 1-800-FDA-1088. Where should I keep my medicine? This drug is given in a hospital or clinic and will not be stored at home. NOTE: This sheet is a summary. It may not cover all possible information. If you have questions about this medicine, talk to your doctor,  pharmacist, or health care provider.  2015, [employer]. (2012-09-05 16:22:58) Doxorubicin injection What is this medicine? DOXORUBICIN (dox oh ROO bi sin) is a chemotherapy drug. It is used to treat many kinds of cancer like Hodgkin's disease, leukemia, non-Hodgkin's lymphoma, neuroblastoma, sarcoma, and Wilms' tumor. It is also used to treat bladder cancer, breast cancer, lung cancer, ovarian cancer, stomach cancer, and thyroid cancer. This medicine may be used for other purposes; ask your health care provider or pharmacist if you have questions. COMMON BRAND NAME(S): Adriamycin, Adriamycin PFS, Adriamycin RDF, Rubex What should I tell my health care provider before I take this medicine? They need to know if you have any of these conditions: -blood disorders -heart disease, recent heart attack -infection (especially a virus infection such as chickenpox, cold sores, or herpes) -irregular heartbeat -liver disease -recent or ongoing radiation therapy -an unusual or allergic reaction to doxorubicin, other chemotherapy agents, other medicines, foods, dyes, or preservatives -pregnant or trying to get pregnant -breast-feeding How should I use this medicine? This drug is given as an infusion into a vein. It is administered in a hospital or clinic by a specially trained health care professional. If you have pain, swelling, burning or any unusual feeling around the site of your injection, tell your health care professional right away. Talk to your pediatrician regarding the use of this medicine in children. Special care may be needed. Overdosage: If you think you have taken too much of this medicine contact a poison control center or emergency room at once. NOTE: This medicine is only for you. Do not share this medicine with others. What if I miss a dose? It is important not to miss your dose. Call your doctor or health care professional if you are unable to keep an appointment. What may  interact with this medicine? Do not take this medicine with any of the following medications: -cisapride -droperidol -halofantrine -pimozide -zidovudine This medicine may also interact with the following medications: -chloroquine -chlorpromazine -clarithromycin -cyclophosphamide -cyclosporine -erythromycin -medicines for depression, anxiety, or psychotic disturbances -medicines for irregular heart beat like amiodarone, bepridil, dofetilide, encainide, flecainide, propafenone, quinidine -medicines for seizures like ethotoin, fosphenytoin, phenytoin -medicines for nausea, vomiting like dolasetron, ondansetron, palonosetron -medicines to increase blood counts like filgrastim, pegfilgrastim, sargramostim -methadone -methotrexate -pentamidine -progesterone -vaccines -verapamil Talk to your doctor or health care professional before taking any of these medicines: -acetaminophen -aspirin -ibuprofen -ketoprofen -naproxen This list may not describe all possible interactions. Give your health care provider a list of all the medicines, herbs, non-prescription drugs, or dietary supplements you use. Also tell them if you smoke, drink alcohol, or use illegal drugs. Some items may interact with your medicine. What should I watch for while using this medicine? Your condition will be monitored carefully while you are receiving this medicine. You will need important blood work done while you are taking this medicine. This drug may make you feel generally unwell. This is not uncommon, as chemotherapy can affect healthy cells as well as cancer cells. Report any side  effects. Continue your course of treatment even though you feel ill unless your doctor tells you to stop. Your urine may turn red for a few days after your dose. This is not blood. If your urine is dark or brown, call your doctor. In some cases, you may be given additional medicines to help with side effects. Follow all directions for  their use. Call your doctor or health care professional for advice if you get a fever, chills or sore throat, or other symptoms of a cold or flu. Do not treat yourself. This drug decreases your body's ability to fight infections. Try to avoid being around people who are sick. This medicine may increase your risk to bruise or bleed. Call your doctor or health care professional if you notice any unusual bleeding. Be careful brushing and flossing your teeth or using a toothpick because you may get an infection or bleed more easily. If you have any dental work done, tell your dentist you are receiving this medicine. Avoid taking products that contain aspirin, acetaminophen, ibuprofen, naproxen, or ketoprofen unless instructed by your doctor. These medicines may hide a fever. Men and women of childbearing age should use effective birth control methods while using taking this medicine. Do not become pregnant while taking this medicine. There is a potential for serious side effects to an unborn child. Talk to your health care professional or pharmacist for more information. Do not breast-feed an infant while taking this medicine. Do not let others touch your urine or other body fluids for 5 days after each treatment with this medicine. Caregivers should wear latex gloves to avoid touching body fluids during this time. There is a maximum amount of this medicine you should receive throughout your life. The amount depends on the medical condition being treated and your overall health. Your doctor will watch how much of this medicine you receive in your lifetime. Tell your doctor if you have taken this medicine before. What side effects may I notice from receiving this medicine? Side effects that you should report to your doctor or health care professional as soon as possible: -allergic reactions like skin rash, itching or hives, swelling of the face, lips, or tongue -low blood counts - this medicine may decrease  the number of white blood cells, red blood cells and platelets. You may be at increased risk for infections and bleeding. -signs of infection - fever or chills, cough, sore throat, pain or difficulty passing urine -signs of decreased platelets or bleeding - bruising, pinpoint red spots on the skin, black, tarry stools, blood in the urine -signs of decreased red blood cells - unusually weak or tired, fainting spells, lightheadedness -breathing problems -chest pain -fast, irregular heartbeat -mouth sores -nausea, vomiting -pain, swelling, redness at site where injected -pain, tingling, numbness in the hands or feet -swelling of ankles, feet, or hands -unusual bleeding or bruising Side effects that usually do not require medical attention (report to your doctor or health care professional if they continue or are bothersome): -diarrhea -facial flushing -hair loss -loss of appetite -missed menstrual periods -nail discoloration or damage -red or watery eyes -red colored urine -stomach upset This list may not describe all possible side effects. Call your doctor for medical advice about side effects. You may report side effects to FDA at 1-800-FDA-1088. Where should I keep my medicine? This drug is given in a hospital or clinic and will not be stored at home. NOTE: This sheet is a summary. It may not cover all  possible information. If you have questions about this medicine, talk to your doctor, pharmacist, or health care provider.  2015, [employer] Standard. (2013-02-17 09:54:34)

## 2014-08-18 ENCOUNTER — Other Ambulatory Visit: Payer: Self-pay | Admitting: Emergency Medicine

## 2014-08-18 ENCOUNTER — Telehealth: Payer: Self-pay | Admitting: *Deleted

## 2014-08-18 ENCOUNTER — Other Ambulatory Visit: Payer: Self-pay | Admitting: Oncology

## 2014-08-18 ENCOUNTER — Telehealth: Payer: Self-pay | Admitting: Adult Health

## 2014-08-18 ENCOUNTER — Encounter: Payer: Self-pay | Admitting: Oncology

## 2014-08-18 MED ORDER — LIDOCAINE-PRILOCAINE 2.5-2.5 % EX CREA: 1.0000 "application " | TOPICAL_CREAM | CUTANEOUS | Status: DC | PRN

## 2014-08-18 NOTE — Telephone Encounter (Signed)
per pof to sch pt appt-sch Doppler-gave pt appt times-pt stated will get updated once once she comes in on 10/20-ECHO sch pt has time & date-10/26 appt sch w/Curcio per [facility]

## 2014-08-18 NOTE — Telephone Encounter (Signed)
I have adjusted appt for 11/3

## 2014-08-18 NOTE — Progress Notes (Signed)
Faxed clinical information to [facility] for patient's disability

## 2014-08-19 ENCOUNTER — Ambulatory Visit (HOSPITAL_COMMUNITY)
Admission: RE | Admit: 2014-08-19 | Discharge: 2014-08-19 | Disposition: A | Discharge status: Home / Self Care | Payer: BC Managed Care – PPO | Source: Ambulatory Visit | Attending: Adult Health | Admitting: Adult Health

## 2014-08-19 ENCOUNTER — Telehealth: Payer: Self-pay | Admitting: *Deleted

## 2014-08-19 DIAGNOSIS — C50211 Malignant neoplasm of upper-inner quadrant of right female breast: Secondary | ICD-10-CM | POA: Insufficient documentation

## 2014-08-19 DIAGNOSIS — M7989 Other specified soft tissue disorders: Secondary | ICD-10-CM | POA: Diagnosis present

## 2014-08-19 DIAGNOSIS — R600 Localized edema: Secondary | ICD-10-CM

## 2014-08-19 MED ORDER — LORAZEPAM 0.5 MG PO TABS: 0.5000 mg | ORAL_TABLET | Freq: Three times a day (TID) | ORAL | Status: DC

## 2014-08-19 NOTE — Telephone Encounter (Signed)
NOTIFIED LINDSEY CORNETTO,NP. SHE SENT PRESCRIPTIONS ESCRIBE TO PT.'S PHARMACY. NOTIFIED PT. THAT PRESCRIPTIONS WERE SENT TO THE PHARMACY.

## 2014-08-19 NOTE — Progress Notes (Signed)
Left upper extremity venous duplex completed:  No evidence of DVT or superficial thrombosis.    

## 2014-08-20 ENCOUNTER — Other Ambulatory Visit: Payer: Self-pay | Admitting: *Deleted

## 2014-08-20 ENCOUNTER — Encounter: Payer: Self-pay | Admitting: Adult Health

## 2014-08-20 ENCOUNTER — Other Ambulatory Visit: Payer: Self-pay | Admitting: Adult Health

## 2014-08-20 ENCOUNTER — Telehealth: Payer: Self-pay | Admitting: *Deleted

## 2014-08-20 DIAGNOSIS — C50211 Malignant neoplasm of upper-inner quadrant of right female breast: Secondary | ICD-10-CM

## 2014-08-20 MED ORDER — DEXAMETHASONE 4 MG PO TABS: 8.0000 mg | ORAL_TABLET | ORAL | Status: DC

## 2014-08-20 MED ORDER — ONDANSETRON HCL 8 MG PO TABS: 8.0000 mg | ORAL_TABLET | Freq: Three times a day (TID) | ORAL | Status: DC | PRN

## 2014-08-20 MED ORDER — LIDOCAINE-PRILOCAINE 2.5-2.5 % EX CREA: 1.0000 "application " | TOPICAL_CREAM | CUTANEOUS | Status: DC | PRN

## 2014-08-20 MED ORDER — PROCHLORPERAZINE MALEATE 10 MG PO TABS: 10.0000 mg | ORAL_TABLET | Freq: Four times a day (QID) | ORAL | Status: DC | PRN

## 2014-08-20 NOTE — Telephone Encounter (Signed)
Called pt to let her know Rx's for chemo meds will be at [facility] on [address]. Pt verbalized understanding and said she will pick them up. Message to be forwarded to Charlestine Massed, NP.

## 2014-08-23 ENCOUNTER — Ambulatory Visit (HOSPITAL_COMMUNITY)
Admit: 2014-08-23 | Discharge: 2014-08-23 | Disposition: A | Discharge status: Home / Self Care | Payer: BC Managed Care – PPO | Source: Ambulatory Visit | Attending: Oncology | Admitting: Oncology

## 2014-08-23 DIAGNOSIS — C50919 Malignant neoplasm of unspecified site of unspecified female breast: Secondary | ICD-10-CM | POA: Diagnosis not present

## 2014-08-23 DIAGNOSIS — R0600 Dyspnea, unspecified: Secondary | ICD-10-CM | POA: Diagnosis not present

## 2014-08-23 DIAGNOSIS — I519 Heart disease, unspecified: Secondary | ICD-10-CM

## 2014-08-23 DIAGNOSIS — Z01818 Encounter for other preprocedural examination: Secondary | ICD-10-CM | POA: Insufficient documentation

## 2014-08-23 DIAGNOSIS — C50211 Malignant neoplasm of upper-inner quadrant of right female breast: Secondary | ICD-10-CM

## 2014-08-23 NOTE — Progress Notes (Signed)
  Echocardiogram 2D Echocardiogram has been performed.  Melissa Garza 08/23/2014, 9:57 AM

## 2014-08-23 NOTE — Addendum Note (Signed)
Addended by: Minette Headland on: 08/23/2014 03:56 PM   Modules accepted: Orders

## 2014-08-24 ENCOUNTER — Ambulatory Visit (HOSPITAL_BASED_OUTPATIENT_CLINIC_OR_DEPARTMENT_OTHER): Payer: BC Managed Care – PPO

## 2014-08-24 ENCOUNTER — Other Ambulatory Visit (HOSPITAL_BASED_OUTPATIENT_CLINIC_OR_DEPARTMENT_OTHER): Payer: BC Managed Care – PPO

## 2014-08-24 VITALS — BP 142/78 | HR 73 | Temp 98.0°F

## 2014-08-24 DIAGNOSIS — C50211 Malignant neoplasm of upper-inner quadrant of right female breast: Secondary | ICD-10-CM

## 2014-08-24 DIAGNOSIS — Z5111 Encounter for antineoplastic chemotherapy: Secondary | ICD-10-CM

## 2014-08-24 LAB — CBC WITH DIFFERENTIAL/PLATELET
BASO%: 0.4 % (ref 0.0–2.0)
Basophils Absolute: 0 10*3/uL (ref 0.0–0.1)
EOS%: 3.9 % (ref 0.0–7.0)
Eosinophils Absolute: 0.2 10*3/uL (ref 0.0–0.5)
HCT: 38.1 % (ref 34.8–46.6)
HGB: 12.5 g/dL (ref 11.6–15.9)
LYMPH%: 33.8 % (ref 14.0–49.7)
MCH: 32.1 pg (ref 25.1–34.0)
MCHC: 32.8 g/dL (ref 31.5–36.0)
MCV: 97.7 fL (ref 79.5–101.0)
MONO#: 0.3 10*3/uL (ref 0.1–0.9)
MONO%: 6.4 % (ref 0.0–14.0)
NEUT#: 3 10*3/uL (ref 1.5–6.5)
NEUT%: 55.5 % (ref 38.4–76.8)
Platelets: 210 10*3/uL (ref 145–400)
RBC: 3.9 10*6/uL (ref 3.70–5.45)
RDW: 12.7 % (ref 11.2–14.5)
WBC: 5.4 10*3/uL (ref 3.9–10.3)
lymph#: 1.8 10*3/uL (ref 0.9–3.3)

## 2014-08-24 LAB — COMPREHENSIVE METABOLIC PANEL (CC13)
ALT: 15 U/L (ref 0–55)
ANION GAP: 7 meq/L (ref 3–11)
AST: 15 U/L (ref 5–34)
Albumin: 3.3 g/dL — ABNORMAL LOW (ref 3.5–5.0)
Alkaline Phosphatase: 82 U/L (ref 40–150)
BUN: 11.4 mg/dL (ref 7.0–26.0)
CO2: 26 meq/L (ref 22–29)
Calcium: 9.5 mg/dL (ref 8.4–10.4)
Chloride: 107 mEq/L (ref 98–109)
Creatinine: 0.9 mg/dL (ref 0.6–1.1)
Glucose: 94 mg/dl (ref 70–140)
Potassium: 3.7 mEq/L (ref 3.5–5.1)
SODIUM: 140 meq/L (ref 136–145)
TOTAL PROTEIN: 6.4 g/dL (ref 6.4–8.3)
Total Bilirubin: 0.29 mg/dL (ref 0.20–1.20)

## 2014-08-24 MED ORDER — DEXAMETHASONE SODIUM PHOSPHATE 20 MG/5ML IJ SOLN
INTRAMUSCULAR | Status: AC
  Filled 2014-08-24: qty 5

## 2014-08-24 MED ORDER — SODIUM CHLORIDE 0.9 % IV SOLN
Freq: Once | INTRAVENOUS | Status: AC
  Administered 2014-08-24: 10:00:00 via INTRAVENOUS

## 2014-08-24 MED ORDER — FOSAPREPITANT DIMEGLUMINE INJECTION 150 MG
150.0000 mg | Freq: Once | INTRAVENOUS | Status: AC
  Administered 2014-08-24: 150 mg via INTRAVENOUS
  Filled 2014-08-24: qty 5

## 2014-08-24 MED ORDER — PALONOSETRON HCL INJECTION 0.25 MG/5ML
0.2500 mg | Freq: Once | INTRAVENOUS | Status: AC
  Administered 2014-08-24: 0.25 mg via INTRAVENOUS

## 2014-08-24 MED ORDER — PALONOSETRON HCL INJECTION 0.25 MG/5ML
INTRAVENOUS | Status: AC
  Filled 2014-08-24: qty 5

## 2014-08-24 MED ORDER — SODIUM CHLORIDE 0.9 % IV SOLN
600.0000 mg/m2 | Freq: Once | INTRAVENOUS | Status: AC
  Administered 2014-08-24: 1020 mg via INTRAVENOUS
  Filled 2014-08-24: qty 51

## 2014-08-24 MED ORDER — DOXORUBICIN HCL CHEMO IV INJECTION 2 MG/ML
60.0000 mg/m2 | Freq: Once | INTRAVENOUS | Status: AC
  Administered 2014-08-24: 102 mg via INTRAVENOUS
  Filled 2014-08-24: qty 51

## 2014-08-24 MED ORDER — SODIUM CHLORIDE 0.9 % IJ SOLN
10.0000 mL | INTRAMUSCULAR | Status: DC | PRN
  Administered 2014-08-24: 10 mL
  Filled 2014-08-24: qty 10

## 2014-08-24 MED ORDER — HEPARIN SOD (PORK) LOCK FLUSH 100 UNIT/ML IV SOLN
500.0000 [IU] | Freq: Once | INTRAVENOUS | Status: AC | PRN
  Administered 2014-08-24: 500 [IU]
  Filled 2014-08-24: qty 5

## 2014-08-24 MED ORDER — DEXAMETHASONE SODIUM PHOSPHATE 20 MG/5ML IJ SOLN
12.0000 mg | Freq: Once | INTRAMUSCULAR | Status: AC
  Administered 2014-08-24: 12 mg via INTRAVENOUS

## 2014-08-24 NOTE — Patient Instructions (Addendum)
[facility] Discharge Instructions for Patients Receiving Chemotherapy  Today you received the following chemotherapy agents adriamycin/cytoxan.    To help prevent nausea and vomiting after your treatment, we encourage you to take your nausea medication, Decadron. Take 2 tablets the morning of 10/21. Then take two tablets twice daily beginning 10/22. Take Compazine (Prochlorperazine) every six hours as needed. (May make you drowsy.) Begin Zofran as needed on 10/23. May take every 8 hours as needed.   If you develop nausea and vomiting that is not controlled by your nausea medication, call the clinic.   BELOW ARE SYMPTOMS THAT SHOULD BE REPORTED IMMEDIATELY:  *FEVER GREATER THAN 100.5 F  *CHILLS WITH OR WITHOUT FEVER  NAUSEA AND VOMITING THAT IS NOT CONTROLLED WITH YOUR NAUSEA MEDICATION  *UNUSUAL SHORTNESS OF BREATH  *UNUSUAL BRUISING OR BLEEDING  TENDERNESS IN MOUTH AND THROAT WITH OR WITHOUT PRESENCE OF ULCERS  *URINARY PROBLEMS  *BOWEL PROBLEMS  UNUSUAL RASH Items with * indicate a potential emergency and should be followed up as soon as possible.  Feel free to call the clinic should you have any questions or concerns. The clinic phone number is [phone].   Doxorubicin injection What is this medicine? DOXORUBICIN (dox oh ROO bi sin) is a chemotherapy drug. It is used to treat many kinds of cancer like Hodgkin's disease, leukemia, non-Hodgkin's lymphoma, neuroblastoma, sarcoma, and Wilms' tumor. It is also used to treat bladder cancer, breast cancer, lung cancer, ovarian cancer, stomach cancer, and thyroid cancer. This medicine may be used for other purposes; ask your health care provider or pharmacist if you have questions. COMMON BRAND NAME(S): Adriamycin, Adriamycin PFS, Adriamycin RDF, Rubex What should I tell my health care provider before I take this medicine? They need to know if you have any of these conditions: -blood disorders -heart disease,  recent heart attack -infection (especially a virus infection such as chickenpox, cold sores, or herpes) -irregular heartbeat -liver disease -recent or ongoing radiation therapy -an unusual or allergic reaction to doxorubicin, other chemotherapy agents, other medicines, foods, dyes, or preservatives -pregnant or trying to get pregnant -breast-feeding How should I use this medicine? This drug is given as an infusion into a vein. It is administered in a hospital or clinic by a specially trained health care professional. If you have pain, swelling, burning or any unusual feeling around the site of your injection, tell your health care professional right away. Talk to your pediatrician regarding the use of this medicine in children. Special care may be needed. Overdosage: If you think you have taken too much of this medicine contact a poison control center or emergency room at once. NOTE: This medicine is only for you. Do not share this medicine with others. What if I miss a dose? It is important not to miss your dose. Call your doctor or health care professional if you are unable to keep an appointment. What may interact with this medicine? Do not take this medicine with any of the following medications: -cisapride -droperidol -halofantrine -pimozide -zidovudine This medicine may also interact with the following medications: -chloroquine -chlorpromazine -clarithromycin -cyclophosphamide -cyclosporine -erythromycin -medicines for depression, anxiety, or psychotic disturbances -medicines for irregular heart beat like amiodarone, bepridil, dofetilide, encainide, flecainide, propafenone, quinidine -medicines for seizures like ethotoin, fosphenytoin, phenytoin -medicines for nausea, vomiting like dolasetron, ondansetron, palonosetron -medicines to increase blood counts like filgrastim, pegfilgrastim,  sargramostim -methadone -methotrexate -pentamidine -progesterone -vaccines -verapamil Talk to your doctor or health care professional before taking any of these medicines: -  acetaminophen -aspirin -ibuprofen -ketoprofen -naproxen This list may not describe all possible interactions. Give your health care provider a list of all the medicines, herbs, non-prescription drugs, or dietary supplements you use. Also tell them if you smoke, drink alcohol, or use illegal drugs. Some items may interact with your medicine. What should I watch for while using this medicine? Your condition will be monitored carefully while you are receiving this medicine. You will need important blood work done while you are taking this medicine. This drug may make you feel generally unwell. This is not uncommon, as chemotherapy can affect healthy cells as well as cancer cells. Report any side effects. Continue your course of treatment even though you feel ill unless your doctor tells you to stop. Your urine may turn red for a few days after your dose. This is not blood. If your urine is dark or brown, call your doctor. In some cases, you may be given additional medicines to help with side effects. Follow all directions for their use. Call your doctor or health care professional for advice if you get a fever, chills or sore throat, or other symptoms of a cold or flu. Do not treat yourself. This drug decreases your body's ability to fight infections. Try to avoid being around people who are sick. This medicine may increase your risk to bruise or bleed. Call your doctor or health care professional if you notice any unusual bleeding. Be careful brushing and flossing your teeth or using a toothpick because you may get an infection or bleed more easily. If you have any dental work done, tell your dentist you are receiving this medicine. Avoid taking products that contain aspirin, acetaminophen, ibuprofen, naproxen, or ketoprofen  unless instructed by your doctor. These medicines may hide a fever. Men and women of childbearing age should use effective birth control methods while using taking this medicine. Do not become pregnant while taking this medicine. There is a potential for serious side effects to an unborn child. Talk to your health care professional or pharmacist for more information. Do not breast-feed an infant while taking this medicine. Do not let others touch your urine or other body fluids for 5 days after each treatment with this medicine. Caregivers should wear latex gloves to avoid touching body fluids during this time. There is a maximum amount of this medicine you should receive throughout your life. The amount depends on the medical condition being treated and your overall health. Your doctor will watch how much of this medicine you receive in your lifetime. Tell your doctor if you have taken this medicine before. What side effects may I notice from receiving this medicine? Side effects that you should report to your doctor or health care professional as soon as possible: -allergic reactions like skin rash, itching or hives, swelling of the face, lips, or tongue -low blood counts - this medicine may decrease the number of white blood cells, red blood cells and platelets. You may be at increased risk for infections and bleeding. -signs of infection - fever or chills, cough, sore throat, pain or difficulty passing urine -signs of decreased platelets or bleeding - bruising, pinpoint red spots on the skin, black, tarry stools, blood in the urine -signs of decreased red blood cells - unusually weak or tired, fainting spells, lightheadedness -breathing problems -chest pain -fast, irregular heartbeat -mouth sores -nausea, vomiting -pain, swelling, redness at site where injected -pain, tingling, numbness in the hands or feet -swelling of ankles, feet, or hands -unusual  bleeding or bruising Side effects that  usually do not require medical attention (report to your doctor or health care professional if they continue or are bothersome): -diarrhea -facial flushing -hair loss -loss of appetite -missed menstrual periods -nail discoloration or damage -red or watery eyes -red colored urine -stomach upset This list may not describe all possible side effects. Call your doctor for medical advice about side effects. You may report side effects to FDA at 1-800-FDA-1088. Where should I keep my medicine? This drug is given in a hospital or clinic and will not be stored at home. NOTE: This sheet is a summary. It may not cover all possible information. If you have questions about this medicine, talk to your doctor, pharmacist, or health care provider.  2015, [employer]. (2013-02-17 09:54:34)  Cyclophosphamide injection What is this medicine? CYCLOPHOSPHAMIDE (sye kloe FOSS fa mide) is a chemotherapy drug. It slows the growth of cancer cells. This medicine is used to treat many types of cancer like lymphoma, myeloma, leukemia, breast cancer, and ovarian cancer, to name a few. This medicine may be used for other purposes; ask your health care provider or pharmacist if you have questions. COMMON BRAND NAME(S): Cytoxan, Neosar What should I tell my health care provider before I take this medicine? They need to know if you have any of these conditions: -blood disorders -history of other chemotherapy -infection -kidney disease -liver disease -recent or ongoing radiation therapy -tumors in the bone marrow -an unusual or allergic reaction to cyclophosphamide, other chemotherapy, other medicines, foods, dyes, or preservatives -pregnant or trying to get pregnant -breast-feeding How should I use this medicine? This drug is usually given as an injection into a vein or muscle or by infusion into a vein. It is administered in a hospital or clinic by a specially trained health care professional. Talk to  your pediatrician regarding the use of this medicine in children. Special care may be needed. Overdosage: If you think you have taken too much of this medicine contact a poison control center or emergency room at once. NOTE: This medicine is only for you. Do not share this medicine with others. What if I miss a dose? It is important not to miss your dose. Call your doctor or health care professional if you are unable to keep an appointment. What may interact with this medicine? This medicine may interact with the following medications: -amiodarone -amphotericin B -azathioprine -certain antiviral medicines for HIV or AIDS such as protease inhibitors (e.g., indinavir, ritonavir) and zidovudine -certain blood pressure medications such as benazepril, captopril, enalapril, fosinopril, lisinopril, moexipril, monopril, perindopril, quinapril, ramipril, trandolapril -certain cancer medications such as anthracyclines (e.g., daunorubicin, doxorubicin), busulfan, cytarabine, paclitaxel, pentostatin, tamoxifen, trastuzumab -certain diuretics such as chlorothiazide, chlorthalidone, hydrochlorothiazide, indapamide, metolazone -certain medicines that treat or prevent blood clots like warfarin -certain muscle relaxants such as succinylcholine -cyclosporine -etanercept -indomethacin -medicines to increase blood counts like filgrastim, pegfilgrastim, sargramostim -medicines used as general anesthesia -metronidazole -natalizumab This list may not describe all possible interactions. Give your health care provider a list of all the medicines, herbs, non-prescription drugs, or dietary supplements you use. Also tell them if you smoke, drink alcohol, or use illegal drugs. Some items may interact with your medicine. What should I watch for while using this medicine? Visit your doctor for checks on your progress. This drug may make you feel generally unwell. This is not uncommon, as chemotherapy can affect healthy  cells as well as cancer cells. Report any side effects. Continue your course  of treatment even though you feel ill unless your doctor tells you to stop. Drink water or other fluids as directed. Urinate often, even at night. In some cases, you may be given additional medicines to help with side effects. Follow all directions for their use. Call your doctor or health care professional for advice if you get a fever, chills or sore throat, or other symptoms of a cold or flu. Do not treat yourself. This drug decreases your body's ability to fight infections. Try to avoid being around people who are sick. This medicine may increase your risk to bruise or bleed. Call your doctor or health care professional if you notice any unusual bleeding. Be careful brushing and flossing your teeth or using a toothpick because you may get an infection or bleed more easily. If you have any dental work done, tell your dentist you are receiving this medicine. You may get drowsy or dizzy. Do not drive, use machinery, or do anything that needs mental alertness until you know how this medicine affects you. Do not become pregnant while taking this medicine or for 1 year after stopping it. Women should inform their doctor if they wish to become pregnant or think they might be pregnant. Men should not father a child while taking this medicine and for 4 months after stopping it. There is a potential for serious side effects to an unborn child. Talk to your health care professional or pharmacist for more information. Do not breast-feed an infant while taking this medicine. This medicine may interfere with the ability to have a child. This medicine has caused ovarian failure in some women. This medicine has caused reduced sperm counts in some men. You should talk with your doctor or health care professional if you are concerned about your fertility. If you are going to have surgery, tell your doctor or health care professional that you  have taken this medicine. What side effects may I notice from receiving this medicine? Side effects that you should report to your doctor or health care professional as soon as possible: -allergic reactions like skin rash, itching or hives, swelling of the face, lips, or tongue -low blood counts - this medicine may decrease the number of white blood cells, red blood cells and platelets. You may be at increased risk for infections and bleeding. -signs of infection - fever or chills, cough, sore throat, pain or difficulty passing urine -signs of decreased platelets or bleeding - bruising, pinpoint red spots on the skin, black, tarry stools, blood in the urine -signs of decreased red blood cells - unusually weak or tired, fainting spells, lightheadedness -breathing problems -dark urine -dizziness -palpitations -swelling of the ankles, feet, hands -trouble passing urine or change in the amount of urine -weight gain -yellowing of the eyes or skin Side effects that usually do not require medical attention (report to your doctor or health care professional if they continue or are bothersome): -changes in nail or skin color -hair loss -missed menstrual periods -mouth sores -nausea, vomiting This list may not describe all possible side effects. Call your doctor for medical advice about side effects. You may report side effects to FDA at 1-800-FDA-1088. Where should I keep my medicine? This drug is given in a hospital or clinic and will not be stored at home. NOTE: This sheet is a summary. It may not cover all possible information. If you have questions about this medicine, talk to your doctor, pharmacist, or health care provider.  2015, [employer]. (2012-09-05  16:22:58)  

## 2014-08-25 ENCOUNTER — Telehealth: Payer: Self-pay

## 2014-08-25 ENCOUNTER — Ambulatory Visit (HOSPITAL_BASED_OUTPATIENT_CLINIC_OR_DEPARTMENT_OTHER): Payer: BC Managed Care – PPO

## 2014-08-25 VITALS — BP 124/80 | HR 106 | Temp 97.8°F

## 2014-08-25 DIAGNOSIS — C50211 Malignant neoplasm of upper-inner quadrant of right female breast: Secondary | ICD-10-CM

## 2014-08-25 DIAGNOSIS — Z5189 Encounter for other specified aftercare: Secondary | ICD-10-CM

## 2014-08-25 MED ORDER — PEGFILGRASTIM INJECTION 6 MG/0.6ML
6.0000 mg | Freq: Once | SUBCUTANEOUS | Status: AC
  Administered 2014-08-25: 6 mg via SUBCUTANEOUS
  Filled 2014-08-25: qty 0.6

## 2014-08-25 NOTE — Patient Instructions (Signed)
Pegfilgrastim injection What is this medicine? PEGFILGRASTIM (peg fil GRA stim) is a long-acting granulocyte colony-stimulating factor that stimulates the growth of neutrophils, a type of white blood cell important in the body's fight against infection. It is used to reduce the incidence of fever and infection in patients with certain types of cancer who are receiving chemotherapy that affects the bone marrow. This medicine may be used for other purposes; ask your health care provider or pharmacist if you have questions. COMMON BRAND NAME(S): Neulasta What should I tell my health care provider before I take this medicine? They need to know if you have any of these conditions: -latex allergy -ongoing radiation therapy -sickle cell disease -skin reactions to acrylic adhesives (On-Body Injector only) -an unusual or allergic reaction to pegfilgrastim, filgrastim, other medicines, foods, dyes, or preservatives -pregnant or trying to get pregnant -breast-feeding How should I use this medicine? This medicine is for injection under the skin. If you get this medicine at home, you will be taught how to prepare and give the pre-filled syringe or how to use the On-body Injector. Refer to the patient Instructions for Use for detailed instructions. Use exactly as directed. Take your medicine at regular intervals. Do not take your medicine more often than directed. It is important that you put your used needles and syringes in a special sharps container. Do not put them in a trash can. If you do not have a sharps container, call your pharmacist or healthcare provider to get one. Talk to your pediatrician regarding the use of this medicine in children. Special care may be needed. Overdosage: If you think you have taken too much of this medicine contact a poison control center or emergency room at once. NOTE: This medicine is only for you. Do not share this medicine with others. What if I miss a dose? It is  important not to miss your dose. Call your doctor or health care professional if you miss your dose. If you miss a dose due to an On-body Injector failure or leakage, a new dose should be administered as soon as possible using a single prefilled syringe for manual use. What may interact with this medicine? Interactions have not been studied. Give your health care provider a list of all the medicines, herbs, non-prescription drugs, or dietary supplements you use. Also tell them if you smoke, drink alcohol, or use illegal drugs. Some items may interact with your medicine. This list may not describe all possible interactions. Give your health care provider a list of all the medicines, herbs, non-prescription drugs, or dietary supplements you use. Also tell them if you smoke, drink alcohol, or use illegal drugs. Some items may interact with your medicine. What should I watch for while using this medicine? You may need blood work done while you are taking this medicine. If you are going to need a MRI, CT scan, or other procedure, tell your doctor that you are using this medicine (On-Body Injector only). What side effects may I notice from receiving this medicine? Side effects that you should report to your doctor or health care professional as soon as possible: -allergic reactions like skin rash, itching or hives, swelling of the face, lips, or tongue -dizziness -fever -pain, redness, or irritation at site where injected -pinpoint red spots on the skin -shortness of breath or breathing problems -stomach or side pain, or pain at the shoulder -swelling -tiredness -trouble passing urine Side effects that usually do not require medical attention (report to your doctor   or health care professional if they continue or are bothersome): -bone pain -muscle pain This list may not describe all possible side effects. Call your doctor for medical advice about side effects. You may report side effects to FDA at  1-800-FDA-1088. Where should I keep my medicine? Keep out of the reach of children. Store pre-filled syringes in a refrigerator between 2 and 8 degrees C (36 and 46 degrees F). Do not freeze. Keep in carton to protect from light. Throw away this medicine if it is left out of the refrigerator for more than 48 hours. Throw away any unused medicine after the expiration date. NOTE: This sheet is a summary. It may not cover all possible information. If you have questions about this medicine, talk to your doctor, pharmacist, or health care provider.  2015, [employer]. (2014-01-21 16:14:05)  

## 2014-08-25 NOTE — Telephone Encounter (Signed)
Patient states she had no nausea or vomiting.  States she did have a headache yesterday that has improved today. Denies diarrhea or constipation.  Tolerating food.  Neulasta given today.  Patient will call with any questions or concerns.

## 2014-08-30 ENCOUNTER — Other Ambulatory Visit (HOSPITAL_BASED_OUTPATIENT_CLINIC_OR_DEPARTMENT_OTHER): Payer: BC Managed Care – PPO

## 2014-08-30 ENCOUNTER — Encounter: Payer: Self-pay | Admitting: Oncology

## 2014-08-30 ENCOUNTER — Ambulatory Visit (HOSPITAL_BASED_OUTPATIENT_CLINIC_OR_DEPARTMENT_OTHER): Payer: BC Managed Care – PPO | Admitting: Oncology

## 2014-08-30 VITALS — BP 119/65 | HR 82 | Temp 97.5°F | Resp 18 | Ht 60.0 in | Wt 147.9 lb

## 2014-08-30 DIAGNOSIS — C50211 Malignant neoplasm of upper-inner quadrant of right female breast: Secondary | ICD-10-CM

## 2014-08-30 DIAGNOSIS — Z171 Estrogen receptor negative status [ER-]: Secondary | ICD-10-CM

## 2014-08-30 DIAGNOSIS — D709 Neutropenia, unspecified: Secondary | ICD-10-CM

## 2014-08-30 LAB — COMPREHENSIVE METABOLIC PANEL (CC13)
ALK PHOS: 79 U/L (ref 40–150)
ALT: 18 U/L (ref 0–55)
AST: 11 U/L (ref 5–34)
Albumin: 3.2 g/dL — ABNORMAL LOW (ref 3.5–5.0)
Anion Gap: 8 mEq/L (ref 3–11)
BILIRUBIN TOTAL: 0.3 mg/dL (ref 0.20–1.20)
BUN: 23.2 mg/dL (ref 7.0–26.0)
CO2: 27 mEq/L (ref 22–29)
CREATININE: 0.8 mg/dL (ref 0.6–1.1)
Calcium: 9.4 mg/dL (ref 8.4–10.4)
Chloride: 103 mEq/L (ref 98–109)
Glucose: 93 mg/dl (ref 70–140)
Potassium: 3.8 mEq/L (ref 3.5–5.1)
Sodium: 138 mEq/L (ref 136–145)
Total Protein: 6.2 g/dL — ABNORMAL LOW (ref 6.4–8.3)

## 2014-08-30 LAB — CBC WITH DIFFERENTIAL/PLATELET
BASO%: 0.6 % (ref 0.0–2.0)
Basophils Absolute: 0 10*3/uL (ref 0.0–0.1)
EOS%: 3.4 % (ref 0.0–7.0)
Eosinophils Absolute: 0.1 10*3/uL (ref 0.0–0.5)
HEMATOCRIT: 37.2 % (ref 34.8–46.6)
HGB: 12.5 g/dL (ref 11.6–15.9)
LYMPH%: 83.9 % — AB (ref 14.0–49.7)
MCH: 32.6 pg (ref 25.1–34.0)
MCHC: 33.6 g/dL (ref 31.5–36.0)
MCV: 96.9 fL (ref 79.5–101.0)
MONO#: 0.1 10*3/uL (ref 0.1–0.9)
MONO%: 4 % (ref 0.0–14.0)
NEUT#: 0.1 10*3/uL — CL (ref 1.5–6.5)
NEUT%: 8.1 % — AB (ref 38.4–76.8)
Platelets: 88 10*3/uL — ABNORMAL LOW (ref 145–400)
RBC: 3.84 10*6/uL (ref 3.70–5.45)
RDW: 12.9 % (ref 11.2–14.5)
WBC: 1.7 10*3/uL — ABNORMAL LOW (ref 3.9–10.3)
lymph#: 1.5 10*3/uL (ref 0.9–3.3)
nRBC: 0 % (ref 0–0)

## 2014-08-30 MED ORDER — CIPROFLOXACIN HCL 500 MG PO TABS: 500.0000 mg | ORAL_TABLET | Freq: Two times a day (BID) | ORAL | Status: DC

## 2014-08-30 NOTE — Progress Notes (Signed)
Melissa Garza  [phone] [phone]     ID: Melissa Garza DOB: 1956-01-01  MR#: [id number]  MWN#:[id number]  Patient Care Team: No Pcp Per Patient as PCP - General (General Practice) Gus Height, MD as Consulting Physician (Obstetrics and Gynecology) Excell Seltzer, MD as Consulting Physician (General Surgery) Chauncey Cruel, MD as Consulting Physician (Oncology) Rexene Edison, MD as Consulting Physician (Radiation Oncology) OTHER MD: Chucky May MD  CHIEF COMPLAINT: Triple negative breast cancer  CURRENT TREATMENT: Neoadjuvant chemotherapy   BREAST CANCER HISTORY: Melissa Garza herself palpated a mass in her right breast approximately mid-September. She brought this to her gynecologist attention and he set her up for bilateral diagnostic mammography at the breast center 08/04/2014. The breast density was category C. in the patient has bilateral saline implants in place. In the palpable area of concern in the right breast there was an irregular mass measuring up to 2.5 cm. By palpation this was firm at the 12:30 o'clock position. Ultrasound of the right breast confirmed an irregular hypoechoic mass in this area measuring 2.5 cm. There was no right axillary lymphadenopathy noted.  Biopsy of the mass in question 08/04/2014 showed ([id number]) invasive ductal carcinoma, grade 3, triple negative, with an MIB-1 of 90%.  Bilateral breast MRI 08/09/2014 showed in the upper inner quadrant of the right breast an irregular enhancing mass measuring 2.8 cm. There was a 4 mm satellite nodule anterior to this and separated from that by 0.6 cm. The left breast, the remaining of the right breast and the lymph node areas were negative.  The patient's subsequent history is as detailed below  INTERVAL HISTORY: Melissa Garza was evaluated today for chemotherapy toxicites. She received her first cycle of chemo last week. Reports fatigue. Reports nausea. She is not taking  her anti-emetics. She denies fevers, chills, vomiting, constipation, diarrhea, numbness/tingling or any other concerns.   REVIEW OF SYSTEMS: A 10 point review of systems was conducted and is otherwise negative except for what is noted above.     PAST MEDICAL HISTORY: Past Medical History  Diagnosis Date  . Glaucoma   . Anxiety   . Hot flashes   . Depression   . PONV (postoperative nausea and vomiting)   . Headache     chronic  . Shortness of breath     with exertion  . GERD (gastroesophageal reflux disease)     PMH  . Cancer     cancer of right breast  . Hepatitis     Hx: of Hepatitis not sure which one    PAST SURGICAL HISTORY: Past Surgical History  Procedure Laterality Date  . Tonsilectomy/adenoidectomy with myringotomy    . Breast enhancement surgery  2008  . Eye surgery      Lasik  . Colonoscopy    . Breast surgery      Biopsy  . Dilation and curettage of uterus    . Tubal ligation    . Portacath placement N/A 08/13/2014    Procedure: INSERTION PORT-A-CATH;  Surgeon: Excell Seltzer, MD;  Location: [facility] OR;  Service: General;  Laterality: N/A;    FAMILY HISTORY Family History  Problem Relation Age of Onset  . Psoriasis Mother   . Hypertension Father   . Asthma Other   . Thyroid disease Other    The patient's parents are currently alive, in their late 59s. The patient had no brothers, 2 sisters. There is no history of breast or ovarian cancer in the family to her  knowledge.   GYNECOLOGIC HISTORY:  No LMP recorded. Patient is postmenopausal. Menarche age 85, first live birth age 20. She is GX P2. She stopped having periods in 2005 and started hormone replacement at that time. 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 Her husband, Melissa Garza, is disabled secondary to multiple cardiac problems. Son Melissa Garza this and Melissa Garza and  works in apartment maintenance. Son Melissa Garza me [name] is his wife) works as an Clinical biochemist. The patient has 6 grandchildren. She is not a Ambulance person.    ADVANCED DIRECTIVES: Not in place   HEALTH MAINTENANCE: History  Substance Use Topics  . Smoking status: Never Smoker   . Smokeless tobacco: Never Used  . Alcohol Use: No     Colonoscopy:Remote  PAP:2014  Bone density:Never  Lipid panel:  Allergies  Allergen Reactions  . Hydrocodone Hives and Itching    Current Outpatient Prescriptions  Medication Sig Dispense Refill  . acetaminophen (TYLENOL) 500 MG tablet Take 1,000 mg by mouth every 6 (six) hours as needed for mild pain.      Marland Kitchen ALPRAZolam (XANAX) 1 MG tablet Take 1 mg by mouth 4 (four) times daily as needed for anxiety.      . Armodafinil (NUVIGIL) 250 MG tablet Take 250 mg by mouth once a week.       . benzonatate (TESSALON) 200 MG capsule Take 200 mg by mouth 3 (three) times daily as needed for cough.      . butalbital-acetaminophen-caffeine (FIORICET, ESGIC) 50-325-40 MG per tablet Take 1 tablet by mouth 2 (two) times daily as needed for headache.       . ciprofloxacin (CIPRO) 500 MG tablet Take 1 tablet (500 mg total) by mouth 2 (two) times daily.  10 tablet  0  . CLOBETASOL PROPIONATE EX Apply 1 application topically daily as needed (DRY SCALP).      Marland Kitchen dexamethasone (DECADRON) 4 MG tablet Take 2 tablets (8 mg total) by mouth as directed. Take two tablets the day after chemotherapy, then two tablets BID for two days.  30 tablet  3  . Fluocinolone Acetonide (DERMA-SMOOTHE/FS SCALP) 0.01 % OIL Apply 1 application topically daily as needed (DRY SCALP).      Marland Kitchen FLUoxetine (PROZAC) 40 MG capsule Take 40 mg by mouth daily.      . fluticasone (CUTIVATE) 0.05 % cream Apply 1 application topically daily as needed (psoriasis).       Marland Kitchen lidocaine-prilocaine (EMLA) cream Apply 1 application topically as needed. Apply to portacath  1 1/2 - 2 hours as needed prior to procedures.  30 g   0  . LORazepam (ATIVAN) 0.5 MG tablet Take 1 tablet (0.5 mg total) by mouth every 8 (eight) hours.  30 tablet  0  . methylphenidate 54 MG PO CR tablet Take 54 mg by mouth every morning.      . nystatin-triamcinolone (MYCOLOG II) cream Apply 1 application topically daily as needed.       . ondansetron (ZOFRAN) 8 MG tablet Take 1 tablet (8 mg total) by mouth every 8 (eight) hours as needed for nausea or vomiting. Start the third day after chemo  20 tablet  0  . Polyvinyl Alcohol-Povidone (REFRESH OP) Place 2 drops into both eyes daily as needed (ALLERGIES).      Marland Kitchen prochlorperazine (COMPAZINE) 10 MG tablet Take 1 tablet (10  mg total) by mouth every 6 (six) hours as needed for nausea or vomiting.  30 tablet  0  . travoprost, benzalkonium, (TRAVATAN) 0.004 % ophthalmic solution Place 1 drop into both eyes at bedtime.       . traZODone (DESYREL) 50 MG tablet Take 50 mg by mouth at bedtime as needed for sleep.      Marland Kitchen zolpidem (AMBIEN) 10 MG tablet Take 10 mg by mouth at bedtime as needed for sleep.       No current facility-administered medications for this visit.    OBJECTIVE: Middle-aged white woman who appears stated age  13 Vitals:   08/30/14 1505  BP: 119/65  Pulse: 82  Temp: 97.5 F (36.4 C)  Resp: 18     Body mass index is 28.88 kg/(m^2).    ECOG FS:1 - Symptomatic but completely ambulatory  GENERAL: Patient is a well appearing female in no acute distress HEENT:  Sclerae anicteric.  Oropharynx clear and moist. No ulcerations or evidence of oropharyngeal candidiasis. Neck is supple.  NODES:  No cervical, supraclavicular, or axillary lymphadenopathy palpated.  BREAST EXAM:  Deferred. LUNGS:  Clear to auscultation bilaterally.  No wheezes or rhonchi. HEART:  Regular rate and rhythm. No murmur appreciated. ABDOMEN:  Soft, nontender.  Positive, normoactive bowel sounds. No organomegaly palpated. MSK:  No focal spinal tenderness to palpation. Full range of motion bilaterally in the upper  extremities. EXTREMITIES:  Left arm with mild swelling SKIN:  Clear with no obvious rashes or skin changes. No nail dyscrasia. NEURO:  Nonfocal. Well oriented.  Appropriate affect.     LAB RESULTS:  CMP     Component Value Date/Time   NA 138 08/30/2014 1445   NA 143 06/17/2009 1137   K 3.8 08/30/2014 1445   K 4.7 06/17/2009 1137   CL 114* 06/17/2009 1137   CO2 27 08/30/2014 1445   CO2 24 06/17/2009 1137   GLUCOSE 93 08/30/2014 1445   GLUCOSE 97 06/17/2009 1137   GLUCOSE 81 10/22/2006 1253   BUN 23.2 08/30/2014 1445   BUN 16 06/17/2009 1137   CREATININE 0.8 08/30/2014 1445   CREATININE 1.2 06/17/2009 1137   CALCIUM 9.4 08/30/2014 1445   CALCIUM 9.4 06/17/2009 1137   PROT 6.2* 08/30/2014 1445   PROT 7.0 06/17/2009 1137   ALBUMIN 3.2* 08/30/2014 1445   ALBUMIN 3.9 06/17/2009 1137   AST 11 08/30/2014 1445   AST 15 06/17/2009 1137   ALT 18 08/30/2014 1445   ALT 13 06/17/2009 1137   ALKPHOS 79 08/30/2014 1445   ALKPHOS 55 06/17/2009 1137   BILITOT 0.30 08/30/2014 1445   BILITOT 0.7 06/17/2009 1137   GFRNONAA 49.82 06/17/2009 1137    I No results found for this basename: SPEP,  UPEP,   kappa and lambda light chains    Lab Results  Component Value Date   WBC 1.7* 08/30/2014   NEUTROABS 0.1* 08/30/2014   HGB 12.5 08/30/2014   HCT 37.2 08/30/2014   MCV 96.9 08/30/2014   PLT 88* 08/30/2014      Chemistry      Component Value Date/Time   NA 138 08/30/2014 1445   NA 143 06/17/2009 1137   K 3.8 08/30/2014 1445   K 4.7 06/17/2009 1137   CL 114* 06/17/2009 1137   CO2 27 08/30/2014 1445   CO2 24 06/17/2009 1137   BUN 23.2 08/30/2014 1445   BUN 16 06/17/2009 1137   CREATININE 0.8 08/30/2014 1445   CREATININE 1.2 06/17/2009 1137  Component Value Date/Time   CALCIUM 9.4 08/30/2014 1445   CALCIUM 9.4 06/17/2009 1137   ALKPHOS 79 08/30/2014 1445   ALKPHOS 55 06/17/2009 1137   AST 11 08/30/2014 1445   AST 15 06/17/2009 1137   ALT 18 08/30/2014 1445   ALT 13 06/17/2009 1137    BILITOT 0.30 08/30/2014 1445   BILITOT 0.7 06/17/2009 1137       No results found for this basename: LABCA2    No components found with this basename: LABCA125    No results found for this basename: INR,  in the last 168 hours  Urinalysis No results found for this basename: colorurine,  appearanceur,  labspec,  phurine,  glucoseu,  hgbur,  bilirubinur,  ketonesur,  proteinur,  urobilinogen,  nitrite,  leukocytesur    STUDIES: Mr Breast Bilateral W Wo Contrast  08/09/2014   CLINICAL DATA:  Recent diagnosis of grade 3 invasive ductal carcinoma and 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  EXAM: BILATERAL BREAST MRI WITH AND WITHOUT CONTRAST  TECHNIQUE: Multiplanar, multisequence MR images of both breasts were obtained prior to and following the intravenous administration of 70ml of MultiHance.  THREE-DIMENSIONAL MR IMAGE RENDERING ON INDEPENDENT WORKSTATION:  Three-dimensional MR images were rendered by post-processing of the original MR data on an independent workstation. The three-dimensional MR images were interpreted, and findings are reported in the following complete MRI report for this study. Three dimensional images were evaluated at the independent DynaCad workstation  COMPARISON:  Recent exams in September 2015.  FINDINGS: Breast composition: c:  Heterogeneous fibroglandular tissue  Background parenchymal enhancement: Moderate  Right breast: There is a subpectoral saline breast implant. In the upper inner quadrant, within the posterior third of the breast parenchyma, is a heterogeneously-enhancing irregular mass with central biopsy clip artifact. The mass measures 2.2 x 2.2 x 2.8 cm and demonstrates washout kinetics. There is a 4 mm satellite nodule 6 mm directly anterior to the lateral margin of the biopsy-proven carcinoma. The remainder of the right breast is negative.  Left breast: Subpectoral saline  breast implant is present. No mass or abnormal enhancement.  Lymph nodes: No abnormal appearing internal mammary chain or axillary lymph nodes are dense fat bilaterally.  Ancillary findings:  None.  IMPRESSION: 1. Biopsy proven carcinoma in the upper inner quadrant of the right breast measures 2.2 x 2.2 x 2.8 cm by MRI. Biopsy clip is positioned within the mass. There is a single 4 mm satellite nodule positioned 6 mm anterior to the lateral margin of the biopsy-proven carcinoma.  2.  No MRI evidence of malignancy in the left breast.  3.  Negative for lymphadenopathy.  RECOMMENDATION: Treatment planning for known right breast carcinoma.  BI-RADS CATEGORY  6: Known biopsy-proven malignancy.   Electronically Signed   By: Curlene Dolphin M.D.   On: 08/09/2014 12:55   Mm Digital Diagnostic Unilat R  08/04/2014   CLINICAL DATA:  Post biopsy of a highly suspicious mass in the upper right breast at 10/1929.  EXAM: DIAGNOSTIC RIGHT MAMMOGRAM POST ULTRASOUND BIOPSY  COMPARISON:  Previous exams  FINDINGS: Mammographic images were obtained following ultrasound guided biopsy of a highly suspicious mass in the right breast at 12:30. A heart shaped biopsy marking clip is present in the targeted region of the mass.  IMPRESSION: Appropriate positioning of Heart [id number] shaped biopsy marking clip post biopsy of a highly suspicious mass in the right breast  at 12:30.  Final Assessment: Post Procedure Mammograms for Marker Placement   Electronically Signed   By: Everlean Alstrom M.D.   On: 08/04/2014 10:51   US Breast Ltd Uni Right Inc Axilla  08/04/2014   CLINICAL DATA:  58 year old female with a palpable abnormality in the right breast.  EXAM: DIGITAL DIAGNOSTIC  BILATERAL MAMMOGRAM WITH CAD  ULTRASOUND RIGHT BREAST  COMPARISON:  Previous exams.  ACR Breast Density Category c: The breast tissue is heterogeneously dense, which may obscure small masses.  FINDINGS: Bilateral saline implants are present. Standard and implant displaced  views were performed. Spot compression tangential view over the palpable site of concern in the right breast was performed demonstrating an irregular mass measuring up to 2.5 cm. No suspicious masses or calcifications are seen in the left breast.  Mammographic images were processed with CAD.  Physical examination at site of palpable concern reveals a firm mass at the approximate 12:30 position.  Targeted ultrasound of the right breast was performed demonstrating an irregular hypoechoic mass with increased vascularity at 12 30 7  cm from the nipple measuring 2.2 x 1.3 x 2.5 cm. This corresponds with mammography findings. No definite lymphadenopathy seen in the right axilla.  IMPRESSION: Highly suspicious of right breast mass.  RECOMMENDATION: Ultrasound-guided biopsy of a highly suspicious mass in the upper right breast is recommended. This will subsequently be performed and dictated separately.  I have discussed the findings and recommendations with the patient. Results were also provided in writing at the conclusion of the visit. If applicable, a reminder letter will be sent to the patient regarding the next appointment.  BI-RADS CATEGORY  5: Highly suggestive of malignancy.   Electronically Signed   By: Everlean Alstrom M.D.   On: 08/04/2014 09:57   Mm Diag Breast W/implant Bilateral  08/04/2014   CLINICAL DATA:  58 year old female with a palpable abnormality in the right breast.  EXAM: DIGITAL DIAGNOSTIC  BILATERAL MAMMOGRAM WITH CAD  ULTRASOUND RIGHT BREAST  COMPARISON:  Previous exams.  ACR Breast Density Category c: The breast tissue is heterogeneously dense, which may obscure small masses.  FINDINGS: Bilateral saline implants are present. Standard and implant displaced views were performed. Spot compression tangential view over the palpable site of concern in the right breast was performed demonstrating an irregular mass measuring up to 2.5 cm. No suspicious masses or calcifications are seen in the left  breast.  Mammographic images were processed with CAD.  Physical examination at site of palpable concern reveals a firm mass at the approximate 12:30 position.  Targeted ultrasound of the right breast was performed demonstrating an irregular hypoechoic mass with increased vascularity at 12 30 7  cm from the nipple measuring 2.2 x 1.3 x 2.5 cm. This corresponds with mammography findings. No definite lymphadenopathy seen in the right axilla.  IMPRESSION: Highly suspicious of right breast mass.  RECOMMENDATION: Ultrasound-guided biopsy of a highly suspicious mass in the upper right breast is recommended. This will subsequently be performed and dictated separately.  I have discussed the findings and recommendations with the patient. Results were also provided in writing at the conclusion of the visit. If applicable, a reminder letter will be sent to the patient regarding the next appointment.  BI-RADS CATEGORY  5: Highly suggestive of malignancy.   Electronically Signed   By: Everlean Alstrom M.D.   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 This was found to be concordant by Dr. Andres Shad. Pathology was discussed with the patient by telephone. She reported doing well after the biopsy with tenderness at the site. Post biopsy instructions were reviewed and her questions were answered. She has been scheduled at [facility] on August 11, 2014. A bilateral breast MRI will be scheduled and the patient will be contacted. She was encouraged to come to [facility] [place] for educational materials. My number was provided for future questions and concerns.  Pathology results reported by Susa Raring RN, BSN on August 05, 2014.   Electronically Signed   By: Everlean Alstrom M.D.   On: 08/05/2014 12:49   08/09/2014   CLINICAL DATA:  58 year old female with a highly suspicious right breast mass.  EXAM: ULTRASOUND GUIDED RIGHT BREAST CORE NEEDLE BIOPSY  COMPARISON:  Previous exams.  PROCEDURE: I met with the patient and we discussed the procedure of ultrasound-guided biopsy, including benefits and alternatives. We discussed the high likelihood of a successful procedure. We discussed the risks of the procedure including infection, bleeding, tissue injury, clip migration, and inadequate sampling. Informed written consent was given. The usual time-out protocol was performed immediately prior to the procedure.  Using sterile technique and 2% Lidocaine as local anesthetic, under direct ultrasound visualization, a 12 gauge vacuum-assisteddevice was used to perform biopsy of a highly suspicious mass in the right breast at 10/1929 using a lateral approach. At the conclusion of the procedure, a Heart shaped tissue marker clip was deployed into the biopsy cavity. Follow-up 2-view mammogram was performed and dictated separately.  IMPRESSION: Ultrasound-guided biopsy of a highly suspicious mass in the right breast. No apparent complications.  Electronically Signed: By: Everlean Alstrom M.D. On: 08/04/2014 10:28    ASSESSMENT: 58 y.o. [place] woman status post right breast upper inner quadrant biopsy 08/04/2014 for a clinical T2 N0, stage IIA invasive ductal carcinoma, grade 3, triple negative, with an MIB-1 of 90%.  PLAN:  She is s/p 1 cycle of dose dense AC. Counts reviewed. She is neutropenic today with an ANC of 0.1. Neutropenic precautions have been discussed with the patient. She is to call for a fever of 100.5 or higher. I have prescribed Cipro 500 mg twice a day for 5 days.  I have reviewed anti-emetic use with the patient and her husband.    She will return in one week for her second cycle of dose dense Adriamycin and Cytoxan. She will contact us prior to that visit she  has the questions or concerns.  The patient has a good understanding of the overall plan. She agrees with it. She knows the goal of treatment in her case is cure. She will call with any problems that may develop before her next visit here.   Mikey Bussing, DNP, AGPCNP-BC Medical Oncology [facility] [phone]    08/30/2014 4:25 PM

## 2014-08-30 NOTE — Patient Instructions (Signed)
Your white blood cell count is low as expected from your chemo.  Monitor your temperature. Call immediately for any temp of 100.5 or higher. Pick up your prescription for Cipro and begin tonight. Take 1 tab twice a day until gone.

## 2014-09-01 ENCOUNTER — Encounter: Payer: Self-pay | Admitting: Oncology

## 2014-09-01 NOTE — Progress Notes (Signed)
Pt would like to apply for the [employer].  I requested her latest bank statement and STD stub from her and her husband to see if she will qualify for the grant.  Pt will bring the requested docs.

## 2014-09-07 ENCOUNTER — Other Ambulatory Visit: Payer: Self-pay | Admitting: Oncology

## 2014-09-07 ENCOUNTER — Telehealth: Payer: Self-pay | Admitting: *Deleted

## 2014-09-07 ENCOUNTER — Ambulatory Visit (HOSPITAL_BASED_OUTPATIENT_CLINIC_OR_DEPARTMENT_OTHER): Payer: BC Managed Care – PPO

## 2014-09-07 ENCOUNTER — Other Ambulatory Visit (HOSPITAL_BASED_OUTPATIENT_CLINIC_OR_DEPARTMENT_OTHER): Payer: BC Managed Care – PPO

## 2014-09-07 ENCOUNTER — Ambulatory Visit (HOSPITAL_BASED_OUTPATIENT_CLINIC_OR_DEPARTMENT_OTHER): Payer: BC Managed Care – PPO | Admitting: Adult Health

## 2014-09-07 ENCOUNTER — Encounter: Payer: Self-pay | Admitting: Adult Health

## 2014-09-07 ENCOUNTER — Ambulatory Visit (HOSPITAL_COMMUNITY)
Admission: RE | Admit: 2014-09-07 | Discharge: 2014-09-07 | Disposition: A | Discharge status: Home / Self Care | Payer: BC Managed Care – PPO | Source: Ambulatory Visit | Attending: Adult Health | Admitting: Adult Health

## 2014-09-07 ENCOUNTER — Other Ambulatory Visit: Payer: Self-pay | Admitting: *Deleted

## 2014-09-07 VITALS — BP 121/76 | HR 104 | Temp 98.2°F | Resp 18 | Ht 60.0 in | Wt 149.2 lb

## 2014-09-07 DIAGNOSIS — K088 Other specified disorders of teeth and supporting structures: Secondary | ICD-10-CM

## 2014-09-07 DIAGNOSIS — C50211 Malignant neoplasm of upper-inner quadrant of right female breast: Secondary | ICD-10-CM

## 2014-09-07 DIAGNOSIS — R21 Rash and other nonspecific skin eruption: Secondary | ICD-10-CM

## 2014-09-07 DIAGNOSIS — K0889 Other specified disorders of teeth and supporting structures: Secondary | ICD-10-CM

## 2014-09-07 DIAGNOSIS — Z171 Estrogen receptor negative status [ER-]: Secondary | ICD-10-CM

## 2014-09-07 DIAGNOSIS — Z5111 Encounter for antineoplastic chemotherapy: Secondary | ICD-10-CM

## 2014-09-07 LAB — COMPREHENSIVE METABOLIC PANEL (CC13)
ALT: 30 U/L (ref 0–55)
ANION GAP: 7 meq/L (ref 3–11)
AST: 15 U/L (ref 5–34)
Albumin: 3.3 g/dL — ABNORMAL LOW (ref 3.5–5.0)
Alkaline Phosphatase: 85 U/L (ref 40–150)
BUN: 12.7 mg/dL (ref 7.0–26.0)
CO2: 24 meq/L (ref 22–29)
Calcium: 9.4 mg/dL (ref 8.4–10.4)
Chloride: 107 mEq/L (ref 98–109)
Creatinine: 1 mg/dL (ref 0.6–1.1)
Glucose: 91 mg/dl (ref 70–140)
Potassium: 3.9 mEq/L (ref 3.5–5.1)
Sodium: 139 mEq/L (ref 136–145)
Total Protein: 6.5 g/dL (ref 6.4–8.3)

## 2014-09-07 LAB — CBC WITH DIFFERENTIAL/PLATELET
BASO%: 0.8 % (ref 0.0–2.0)
Basophils Absolute: 0 10*3/uL (ref 0.0–0.1)
EOS ABS: 0 10*3/uL (ref 0.0–0.5)
EOS%: 0.2 % (ref 0.0–7.0)
HEMATOCRIT: 37.1 % (ref 34.8–46.6)
HGB: 12.2 g/dL (ref 11.6–15.9)
LYMPH%: 21.9 % (ref 14.0–49.7)
MCH: 31.9 pg (ref 25.1–34.0)
MCHC: 32.9 g/dL (ref 31.5–36.0)
MCV: 96.9 fL (ref 79.5–101.0)
MONO#: 0.4 10*3/uL (ref 0.1–0.9)
MONO%: 6.6 % (ref 0.0–14.0)
NEUT#: 4.2 10*3/uL (ref 1.5–6.5)
NEUT%: 70.5 % (ref 38.4–76.8)
PLATELETS: 274 10*3/uL (ref 145–400)
RBC: 3.83 10*6/uL (ref 3.70–5.45)
RDW: 12.9 % (ref 11.2–14.5)
WBC: 5.9 10*3/uL (ref 3.9–10.3)
lymph#: 1.3 10*3/uL (ref 0.9–3.3)

## 2014-09-07 MED ORDER — SODIUM CHLORIDE 0.9 % IV SOLN
150.0000 mg | Freq: Once | INTRAVENOUS | Status: AC
  Administered 2014-09-07: 150 mg via INTRAVENOUS
  Filled 2014-09-07: qty 5

## 2014-09-07 MED ORDER — CLOTRIMAZOLE-BETAMETHASONE 1-0.05 % EX CREA: 1.0000 "application " | TOPICAL_CREAM | Freq: Two times a day (BID) | CUTANEOUS | Status: DC

## 2014-09-07 MED ORDER — AMOXICILLIN-POT CLAVULANATE 875-125 MG PO TABS: 1.0000 | ORAL_TABLET | Freq: Two times a day (BID) | ORAL | Status: DC

## 2014-09-07 MED ORDER — PALONOSETRON HCL INJECTION 0.25 MG/5ML
INTRAVENOUS | Status: AC
  Filled 2014-09-07: qty 5

## 2014-09-07 MED ORDER — SODIUM CHLORIDE 0.9 % IV SOLN
Freq: Once | INTRAVENOUS | Status: AC
  Administered 2014-09-07: 11:00:00 via INTRAVENOUS

## 2014-09-07 MED ORDER — PALONOSETRON HCL INJECTION 0.25 MG/5ML
0.2500 mg | Freq: Once | INTRAVENOUS | Status: AC
  Administered 2014-09-07: 0.25 mg via INTRAVENOUS

## 2014-09-07 MED ORDER — SODIUM CHLORIDE 0.9 % IJ SOLN
10.0000 mL | INTRAMUSCULAR | Status: DC | PRN
  Administered 2014-09-07: 10 mL
  Filled 2014-09-07: qty 10

## 2014-09-07 MED ORDER — DOXORUBICIN HCL CHEMO IV INJECTION 2 MG/ML
60.0000 mg/m2 | Freq: Once | INTRAVENOUS | Status: AC
  Administered 2014-09-07: 102 mg via INTRAVENOUS
  Filled 2014-09-07: qty 51

## 2014-09-07 MED ORDER — DEXAMETHASONE SODIUM PHOSPHATE 20 MG/5ML IJ SOLN
12.0000 mg | Freq: Once | INTRAMUSCULAR | Status: AC
  Administered 2014-09-07: 12 mg via INTRAVENOUS

## 2014-09-07 MED ORDER — HEPARIN SOD (PORK) LOCK FLUSH 100 UNIT/ML IV SOLN
500.0000 [IU] | Freq: Once | INTRAVENOUS | Status: AC | PRN
  Administered 2014-09-07: 500 [IU]
  Filled 2014-09-07: qty 5

## 2014-09-07 MED ORDER — CYCLOPHOSPHAMIDE CHEMO INJECTION 1 GM
600.0000 mg/m2 | Freq: Once | INTRAMUSCULAR | Status: AC
  Administered 2014-09-07: 1020 mg via INTRAVENOUS
  Filled 2014-09-07: qty 51

## 2014-09-07 MED ORDER — DEXAMETHASONE SODIUM PHOSPHATE 20 MG/5ML IJ SOLN
INTRAMUSCULAR | Status: AC
Start: 2014-09-07 — End: 2014-09-07
  Filled 2014-09-07: qty 5

## 2014-09-07 NOTE — Patient Instructions (Signed)
[facility] Discharge Instructions for Patients Receiving Chemotherapy  Today you received the following chemotherapy agents Adriamycin/Cytoxan.   To help prevent nausea and vomiting after your treatment, we encourage you to take your nausea medication as directed.    If you develop nausea and vomiting that is not controlled by your nausea medication, call the clinic.   BELOW ARE SYMPTOMS THAT SHOULD BE REPORTED IMMEDIATELY:  *FEVER GREATER THAN 100.5 F  *CHILLS WITH OR WITHOUT FEVER  NAUSEA AND VOMITING THAT IS NOT CONTROLLED WITH YOUR NAUSEA MEDICATION  *UNUSUAL SHORTNESS OF BREATH  *UNUSUAL BRUISING OR BLEEDING  TENDERNESS IN MOUTH AND THROAT WITH OR WITHOUT PRESENCE OF ULCERS  *URINARY PROBLEMS  *BOWEL PROBLEMS  UNUSUAL RASH Items with * indicate a potential emergency and should be followed up as soon as possible.  Feel free to call the clinic you have any questions or concerns. The clinic phone number is [phone].    

## 2014-09-07 NOTE — Patient Instructions (Signed)
Amoxicillin; Clavulanic Acid tablets What is this medicine? AMOXICILLIN; CLAVULANIC ACID (a mox i SIL in; KLAV yoo lan ic AS id) is a penicillin antibiotic. It is used to treat certain kinds of bacterial infections. It will not work for colds, flu, or other viral infections. This medicine may be used for other purposes; ask your health care provider or pharmacist if you have questions. COMMON BRAND NAME(S): Augmentin What should I tell my health care provider before I take this medicine? They need to know if you have any of these conditions: -bowel disease, like colitis -kidney disease -liver disease -mononucleosis -an unusual or allergic reaction to amoxicillin, penicillin, cephalosporin, other antibiotics, clavulanic acid, other medicines, foods, dyes, or preservatives -pregnant or trying to get pregnant -breast-feeding How should I use this medicine? Take this medicine by mouth with a full glass of water. Follow the directions on the prescription label. Take at the start of a meal. Do not crush or chew. If the tablet has a score line, you may cut it in half at the score line for easier swallowing. Take your medicine at regular intervals. Do not take your medicine more often than directed. Take all of your medicine as directed even if you think you are better. Do not skip doses or stop your medicine early. Talk to your pediatrician regarding the use of this medicine in children. Special care may be needed. Overdosage: If you think you have taken too much of this medicine contact a poison control center or emergency room at once. NOTE: This medicine is only for you. Do not share this medicine with others. What if I miss a dose? If you miss a dose, take it as soon as you can. If it is almost time for your next dose, take only that dose. Do not take double or extra doses. What may interact with this medicine? -allopurinol -anticoagulants -birth control pills -methotrexate -probenecid This  list may not describe all possible interactions. Give your health care provider a list of all the medicines, herbs, non-prescription drugs, or dietary supplements you use. Also tell them if you smoke, drink alcohol, or use illegal drugs. Some items may interact with your medicine. What should I watch for while using this medicine? Tell your doctor or health care professional if your symptoms do not improve. Do not treat diarrhea with over the counter products. Contact your doctor if you have diarrhea that lasts more than 2 days or if it is severe and watery. If you have diabetes, you may get a false-positive result for sugar in your urine. Check with your doctor or health care professional. Birth control pills may not work properly while you are taking this medicine. Talk to your doctor about using an extra method of birth control. What side effects may I notice from receiving this medicine? Side effects that you should report to your doctor or health care professional as soon as possible: -allergic reactions like skin rash, itching or hives, swelling of the face, lips, or tongue -breathing problems -dark urine -fever or chills, sore throat -redness, blistering, peeling or loosening of the skin, including inside the mouth -seizures -trouble passing urine or change in the amount of urine -unusual bleeding, bruising -unusually weak or tired -white patches or sores in the mouth or throat Side effects that usually do not require medical attention (report to your doctor or health care professional if they continue or are bothersome): -diarrhea -dizziness -headache -nausea, vomiting -stomach upset -vaginal or anal irritation This list may   not describe all possible side effects. Call your doctor for medical advice about side effects. You may report side effects to FDA at 1-800-FDA-1088. Where should I keep my medicine? Keep out of the reach of children. Store at room temperature below 25 degrees  C (77 degrees F). Keep container tightly closed. Throw away any unused medicine after the expiration date. NOTE: This sheet is a summary. It may not cover all possible information. If you have questions about this medicine, talk to your doctor, pharmacist, or health care provider.  2015, [employer]. (2008-01-15 12:04:30)

## 2014-09-07 NOTE — Telephone Encounter (Signed)
Per staff message and POF I have scheduled appts. Advised scheduler of appts. JMW  

## 2014-09-07 NOTE — Progress Notes (Signed)
[place]  [phone] [phone]     ID: Melissa Garza DOB: [birth year]  MR#: [id number]  QIO#:[id number]  Patient Care Team: No Pcp Per Patient as PCP - General (General Practice) Gus Height, MD as Consulting Physician (Obstetrics and Gynecology) Excell Seltzer, MD as Consulting Physician (General Surgery) Chauncey Cruel, MD as Consulting Physician (Oncology) Rexene Edison, MD as Consulting Physician (Radiation Oncology) OTHER MD: Chucky May MD  CHIEF COMPLAINT: Triple negative breast cancer  CURRENT TREATMENT: Neoadjuvant chemotherapy   BREAST CANCER HISTORY: Helma herself palpated a mass in her right breast approximately mid-September. She brought this to her gynecologist attention and he set her up for bilateral diagnostic mammography at the breast center 08/04/2014. The breast density was category C. in the patient has bilateral saline implants in place. In the palpable area of concern in the right breast there was an irregular mass measuring up to 2.5 cm. By palpation this was firm at the 12:30 o'clock position. Ultrasound of the right breast confirmed an irregular hypoechoic mass in this area measuring 2.5 cm. There was no right axillary lymphadenopathy noted.  Biopsy of the mass in question 08/04/2014 showed ([id number]) invasive ductal carcinoma, grade 3, triple negative, with an MIB-1 of 90%.  Bilateral breast MRI 08/09/2014 showed in the upper inner quadrant of the right breast an irregular enhancing mass measuring 2.8 cm. There was a 4 mm satellite nodule anterior to this and separated from that by 0.6 cm. The left breast, the remaining of the right breast and the lymph node areas were negative.  The patient's subsequent history is as detailed below  INTERVAL HISTORY:  Melissa Garza is here today to receive day 1 cycle 2 of 4 planned cycles of Doxorubicin and Cyclophosphamide.  She receives this on day 1 of a 14 day cycle with  Neulasta given on day 2 for granulocyte support.  She tells me that she is tolerating chemotherapy better than expected.  She does have a rash on her right posterior hamstring, and says that its ringworm.  She says that it has worsened and started last week.  She says her husband has this rash as well.  She is mildly constipated and is taking a stool softner/laxative that she doesn't recall the name to, and it is working well.  She does have some bony aches and she is taking tylenol for it.  She also has a tooth that is hurting every day.  She has had occasional problems with this in the past.  She says that it has increased.  She is taking about 3gm of tylenol per day.  She denies fevers, chills, nausea, vomiting, constipation, diarrhea, numbness, tingling or any further concerns.     REVIEW OF SYSTEMS: A 10 point review of systems was conducted and is otherwise negative except for what is noted above.     PAST MEDICAL HISTORY: Past Medical History  Diagnosis Date  . Glaucoma   . Anxiety   . Hot flashes   . Depression   . PONV (postoperative nausea and vomiting)   . Headache     chronic  . Shortness of breath     with exertion  . GERD (gastroesophageal reflux disease)     PMH  . Cancer     cancer of right breast  . Hepatitis     Hx: of Hepatitis not sure which one    PAST SURGICAL HISTORY: Past Surgical History  Procedure Laterality Date  . Tonsilectomy/adenoidectomy with  myringotomy    . Breast enhancement surgery  2008  . Eye surgery      Lasik  . Colonoscopy    . Breast surgery      Biopsy  . Dilation and curettage of uterus    . Tubal ligation    . Portacath placement N/A 08/13/2014    Procedure: INSERTION PORT-A-CATH;  Surgeon: Excell Seltzer, MD;  Location: [facility] OR;  Service: General;  Laterality: N/A;    FAMILY HISTORY Family History  Problem Relation Age of Onset  . Psoriasis Mother   . Hypertension Father   . Asthma Other   . Thyroid disease Other    The  patient's parents are currently alive, in their late 47s. The patient had no brothers, 2 sisters. There is no history of breast or ovarian cancer in the family to her knowledge.   GYNECOLOGIC HISTORY:  No LMP recorded. Patient is postmenopausal. Menarche age 89, first live birth age 27. She is GX P2. She stopped having periods in 2005 and started hormone replacement at that time. 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 Her husband, [name], is disabled secondary to multiple cardiac problems. Son Melissa Garza this and Melissa Garza and works in apartment maintenance. Son Sonia Side me Melissa Garza is his wife) works as an Clinical biochemist. The patient has 6 grandchildren. She is not a Ambulance person.   ADVANCED DIRECTIVES: Not in place   HEALTH MAINTENANCE: History  Substance Use Topics  . Smoking status: Never Smoker   . Smokeless tobacco: Never Used  . Alcohol Use: No     Colonoscopy:Remote  PAP:2014  Bone density:Never  Lipid panel:  Allergies  Allergen Reactions  . Hydrocodone Hives and Itching    Current Outpatient Prescriptions  Medication Sig Dispense Refill  . acetaminophen (TYLENOL) 500 MG tablet Take 1,000 mg by mouth every 6 (six) hours as needed for mild pain.    Marland Kitchen ALPRAZolam (XANAX) 1 MG tablet Take 1 mg by mouth 4 (four) times daily as needed for anxiety.    . benzonatate (TESSALON) 200 MG capsule Take 200 mg by mouth 3 (three) times daily as needed for cough.    . butalbital-acetaminophen-caffeine (FIORICET, ESGIC) 50-325-40 MG per tablet Take 1 tablet by mouth 2 (two) times daily as needed for headache.     . dexamethasone (DECADRON) 4 MG tablet Take 2 tablets (8 mg total) by mouth as directed. Take two tablets the day after chemotherapy, then two tablets BID for two days. 30 tablet 3  . famotidine (PEPCID) 20 MG tablet Take 20 mg by mouth daily.    Marland Kitchen  FLUoxetine (PROZAC) 40 MG capsule Take 40 mg by mouth daily.    Marland Kitchen lidocaine-prilocaine (EMLA) cream Apply 1 application topically as needed. Apply to portacath  1 1/2 - 2 hours as needed prior to procedures. 30 g 0  . LORazepam (ATIVAN) 0.5 MG tablet Take 1 tablet (0.5 mg total) by mouth every 8 (eight) hours. 30 tablet 0  . methylphenidate 54 MG PO CR tablet Take 54 mg by mouth every morning.    . Polyvinyl Alcohol-Povidone (REFRESH OP) Place 2 drops into both eyes daily as needed (ALLERGIES).    Marland Kitchen traZODone (DESYREL) 50 MG tablet Take 50 mg by mouth at bedtime as needed for sleep.    Marland Kitchen zolpidem (AMBIEN) 10 MG tablet Take 10 mg by  mouth at bedtime as needed for sleep.    Marland Kitchen amoxicillin-clavulanate (AUGMENTIN) 875-125 MG per tablet Take 1 tablet by mouth 2 (two) times daily. 28 tablet 0  . Armodafinil (NUVIGIL) 250 MG tablet Take 250 mg by mouth once a week.     Marland Kitchen CLOBETASOL PROPIONATE EX Apply 1 application topically daily as needed (DRY SCALP).    . clotrimazole-betamethasone (LOTRISONE) cream Apply 1 application topically 2 (two) times daily. 30 g 0  . Fluocinolone Acetonide (DERMA-SMOOTHE/FS SCALP) 0.01 % OIL Apply 1 application topically daily as needed (DRY SCALP).    . fluticasone (CUTIVATE) 0.05 % cream Apply 1 application topically daily as needed (psoriasis).     . nystatin-triamcinolone (MYCOLOG II) cream Apply 1 application topically daily as needed.     . ondansetron (ZOFRAN) 8 MG tablet Take 1 tablet (8 mg total) by mouth every 8 (eight) hours as needed for nausea or vomiting. Start the third day after chemo 20 tablet 0  . prochlorperazine (COMPAZINE) 10 MG tablet Take 1 tablet (10 mg total) by mouth every 6 (six) hours as needed for nausea or vomiting. 30 tablet 0  . travoprost, benzalkonium, (TRAVATAN) 0.004 % ophthalmic solution Place 1 drop into both eyes at bedtime.      No current facility-administered medications for this visit.    OBJECTIVE:  Filed Vitals:   09/07/14 0933    BP: 121/76  Pulse: 104  Temp: 98.2 F (36.8 C)  Resp: 18     Body mass index is 29.14 kg/(m^2).    ECOG FS:1 - Symptomatic but completely ambulatory GENERAL: Patient is a well appearing female in no acute distress HEENT:  Sclerae anicteric.  Oropharynx clear and moist. No ulcerations or evidence of oropharyngeal candidiasis. Neck is supple. Very mild tenderness along the left lower posterior gum line, no swelling noted. NODES:  No cervical, supraclavicular, or axillary lymphadenopathy palpated.  BREAST EXAM:  Deferred. LUNGS:  Clear to auscultation bilaterally.  No wheezes or rhonchi. HEART:  Regular rate and rhythm. No murmur appreciated. ABDOMEN:  Soft, nontender.  Positive, normoactive bowel sounds. No organomegaly palpated. MSK:  No focal spinal tenderness to palpation. Full range of motion bilaterally in the upper extremities. EXTREMITIES:  No peripheral edema.   SKIN: fungal appearing rash on left posterior thigh.  No nail dyscrasia. NEURO:  Nonfocal. Well oriented.  Appropriate affect.   LAB RESULTS:  CMP     Component Value Date/Time   NA 139 09/07/2014 0913   NA 143 06/17/2009 1137   K 3.9 09/07/2014 0913   K 4.7 06/17/2009 1137   CL 114* 06/17/2009 1137   CO2 24 09/07/2014 0913   CO2 24 06/17/2009 1137   GLUCOSE 91 09/07/2014 0913   GLUCOSE 97 06/17/2009 1137   GLUCOSE 81 10/22/2006 1253   BUN 12.7 09/07/2014 0913   BUN 16 06/17/2009 1137   CREATININE 1.0 09/07/2014 0913   CREATININE 1.2 06/17/2009 1137   CALCIUM 9.4 09/07/2014 0913   CALCIUM 9.4 06/17/2009 1137   PROT 6.5 09/07/2014 0913   PROT 7.0 06/17/2009 1137   ALBUMIN 3.3* 09/07/2014 0913   ALBUMIN 3.9 06/17/2009 1137   AST 15 09/07/2014 0913   AST 15 06/17/2009 1137   ALT 30 09/07/2014 0913   ALT 13 06/17/2009 1137   ALKPHOS 85 09/07/2014 0913   ALKPHOS 55 06/17/2009 1137   BILITOT <0.20 09/07/2014 0913   BILITOT 0.7 06/17/2009 1137   GFRNONAA 49.82 06/17/2009 1137    I No results found for:  SPEP  Lab Results  Component Value Date   WBC 5.9 09/07/2014   NEUTROABS 4.2 09/07/2014   HGB 12.2 09/07/2014   HCT 37.1 09/07/2014   MCV 96.9 09/07/2014   PLT 274 09/07/2014      Chemistry      Component Value Date/Time   NA 139 09/07/2014 0913   NA 143 06/17/2009 1137   K 3.9 09/07/2014 0913   K 4.7 06/17/2009 1137   CL 114* 06/17/2009 1137   CO2 24 09/07/2014 0913   CO2 24 06/17/2009 1137   BUN 12.7 09/07/2014 0913   BUN 16 06/17/2009 1137   CREATININE 1.0 09/07/2014 0913   CREATININE 1.2 06/17/2009 1137      Component Value Date/Time   CALCIUM 9.4 09/07/2014 0913   CALCIUM 9.4 06/17/2009 1137   ALKPHOS 85 09/07/2014 0913   ALKPHOS 55 06/17/2009 1137   AST 15 09/07/2014 0913   AST 15 06/17/2009 1137   ALT 30 09/07/2014 0913   ALT 13 06/17/2009 1137   BILITOT <0.20 09/07/2014 0913   BILITOT 0.7 06/17/2009 1137       No results found for: LABCA2  No components found for: LABCA125  No results for input(s): INR in the last 168 hours.  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  EXAM: BILATERAL BREAST MRI WITH AND WITHOUT CONTRAST  TECHNIQUE: Multiplanar, multisequence MR images of both breasts were obtained prior to and following the intravenous administration of 65m of MultiHance.  THREE-DIMENSIONAL MR IMAGE RENDERING ON INDEPENDENT WORKSTATION:  Three-dimensional MR images were rendered by post-processing of the original MR data on an independent workstation. The three-dimensional MR images were interpreted, and findings are reported in the following complete MRI report for this study. Three dimensional images were evaluated at the independent DynaCad workstation  COMPARISON:  Recent exams in September 2015.   FINDINGS: Breast composition: c:  Heterogeneous fibroglandular tissue  Background parenchymal enhancement: Moderate  Right breast: There is a subpectoral saline breast implant. In the upper inner quadrant, within the posterior third of the breast parenchyma, is a heterogeneously-enhancing irregular mass with central biopsy clip artifact. The mass measures 2.2 x 2.2 x 2.8 cm and demonstrates washout kinetics. There is a 4 mm satellite nodule 6 mm directly anterior to the lateral margin of the biopsy-proven carcinoma. The remainder of the right breast is negative.  Left breast: Subpectoral saline breast implant is present. No mass or abnormal enhancement.  Lymph nodes: No abnormal appearing internal mammary chain or axillary lymph nodes are dense fat bilaterally.  Ancillary findings:  None.  IMPRESSION: 1. Biopsy proven carcinoma in the upper inner quadrant of the right breast measures 2.2 x 2.2 x 2.8 cm by MRI. Biopsy clip is positioned within the mass. There is a single 4 mm satellite nodule positioned 6 mm anterior to the lateral margin of the biopsy-proven carcinoma.  2.  No MRI evidence of malignancy in the left breast.  3.  Negative for lymphadenopathy.  RECOMMENDATION: Treatment planning for known right breast carcinoma.  BI-RADS CATEGORY  6: Known biopsy-proven malignancy.   Electronically Signed   By: SCurlene DolphinM.D.   On: 08/09/2014 12:55   Mm Digital Diagnostic Unilat R  08/04/2014   CLINICAL DATA:  Post biopsy of a highly suspicious mass in the upper right breast at 10/1929.  EXAM: DIAGNOSTIC RIGHT MAMMOGRAM POST ULTRASOUND BIOPSY  COMPARISON:  Previous exams  FINDINGS: Mammographic images were obtained following ultrasound guided biopsy of a highly suspicious mass in the right breast at 12:30. A heart shaped biopsy marking clip is present in the targeted region of the mass.  IMPRESSION: Appropriate positioning of Heart 123012 shaped biopsy marking clip post biopsy of a highly suspicious mass in the  right breast at 12:30.  Final Assessment: Post Procedure Mammograms for Marker Placement   Electronically Signed   By: Everlean Alstrom M.D.   On: 08/04/2014 10:51   US Breast Ltd Uni Right Inc Axilla  08/04/2014   CLINICAL DATA:  58 year old female with a palpable abnormality in the right breast.  EXAM: DIGITAL DIAGNOSTIC  BILATERAL MAMMOGRAM WITH CAD  ULTRASOUND RIGHT BREAST  COMPARISON:  Previous exams.  ACR Breast Density Category c: The breast tissue is heterogeneously dense, which may obscure small masses.  FINDINGS: Bilateral saline implants are present. Standard and implant displaced views were performed. Spot compression tangential view over the palpable site of concern in the right breast was performed demonstrating an irregular mass measuring up to 2.5 cm. No suspicious masses or calcifications are seen in the left breast.  Mammographic images were processed with CAD.  Physical examination at site of palpable concern reveals a firm mass at the approximate 12:30 position.  Targeted ultrasound of the right breast was performed demonstrating an irregular hypoechoic mass with increased vascularity at $RemoveBefore'12 30 7 '[id number]$ cm from the nipple measuring 2.2 x 1.3 x 2.5 cm. This corresponds with mammography findings. No definite lymphadenopathy seen in the right axilla.  IMPRESSION: Highly suspicious of right breast mass.  RECOMMENDATION: Ultrasound-guided biopsy of a highly suspicious mass in the upper right breast is recommended. This will subsequently be performed and dictated separately.  I have discussed the findings and recommendations with the patient. Results were also provided in writing at the conclusion of the visit. If applicable, a reminder letter will be sent to the patient regarding the next appointment.  BI-RADS CATEGORY  5: Highly suggestive of malignancy.   Electronically Signed   By: Everlean Alstrom M.D.   On: 08/04/2014 09:57   Mm Diag Breast W/implant Bilateral  08/04/2014   CLINICAL DATA:   58 year old female with a palpable abnormality in the right breast.  EXAM: DIGITAL DIAGNOSTIC  BILATERAL MAMMOGRAM WITH CAD  ULTRASOUND RIGHT BREAST  COMPARISON:  Previous exams.  ACR Breast Density Category c: The breast tissue is heterogeneously dense, which may obscure small masses.  FINDINGS: Bilateral saline implants are present. Standard and implant displaced views were performed. Spot compression tangential view over the palpable site of concern in the right breast was performed demonstrating an irregular mass measuring up to 2.5 cm. No suspicious masses or calcifications are seen in the left breast.  Mammographic images were processed with CAD.  Physical examination at site of palpable concern reveals a firm mass at the approximate 12:30 position.  Targeted ultrasound of the right breast was performed demonstrating an irregular hypoechoic mass with increased vascularity at $RemoveBefore'12 30 7 '[id number]$ cm from the nipple measuring 2.2 x 1.3 x 2.5 cm. This corresponds with mammography findings. No definite lymphadenopathy seen in the right axilla.  IMPRESSION: Highly suspicious of right breast mass.  RECOMMENDATION: Ultrasound-guided biopsy of a highly suspicious mass in the upper right breast is recommended. This will subsequently be performed and dictated separately.  I  have discussed the findings and recommendations with the patient. Results were also provided in writing at the conclusion of the visit. If applicable, a reminder letter will be sent to the patient regarding the next appointment.  BI-RADS CATEGORY  5: Highly suggestive of malignancy.   Electronically Signed   By: Everlean Alstrom M.D.   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 This was found to be concordant by Dr. Andres Shad. Pathology was discussed with the  patient by telephone. She reported doing well after the biopsy with tenderness at the site. Post biopsy instructions were reviewed and her questions were answered. She has been scheduled at [facility] on August 11[facility] 2014. A bilateral breast MRI will be scheduled and the patient will be contacted. She was encouraged to come to [facility] for educational materials. My number was provided for future questions and concerns.  Pathology results reported by Susa Raring RN, BSN on August 05, 2014.   Electronically Signed   By: Everlean Alstrom M.D.   On: 08/05/2014 12:49   08/09/2014   CLINICAL DATA:  58 year old female with a highly suspicious right breast mass.  EXAM: ULTRASOUND GUIDED RIGHT BREAST CORE NEEDLE BIOPSY  COMPARISON:  Previous exams.  PROCEDURE: I met with the patient and we discussed the procedure of ultrasound-guided biopsy, including benefits and alternatives. We discussed the high likelihood of a successful procedure. We discussed the risks of the procedure including infection, bleeding, tissue injury, clip migration, and inadequate sampling. Informed written consent was given. The usual time-out protocol was performed immediately prior to the procedure.  Using sterile technique and 2% Lidocaine as local anesthetic, under direct ultrasound visualization, a 12 gauge vacuum-assisteddevice was used to perform biopsy of a highly suspicious mass in the right breast at 10/1929 using a lateral approach. At the conclusion of the procedure, a Heart shaped tissue marker clip was deployed into the biopsy cavity. Follow-up 2-view mammogram was performed and dictated separately.  IMPRESSION: Ultrasound-guided biopsy of a highly suspicious mass in the right breast. No apparent complications.  Electronically Signed: By: Everlean Alstrom M.D. On: 08/04/2014 10:28    ASSESSMENT: 58 y.o. [place] woman status post right breast upper inner quadrant  biopsy 08/04/2014 for a clinical T2 N0, stage IIA invasive ductal carcinoma, grade 3, triple negative, with an MIB-1 of 90%.  PLAN:  Ashia is doing well today.  Her lab work is normal and I reviewed this with her in detail.  She will proceed with chemotherapy.  Regarding her tooth pain I did recommend that she find a dentist.  She tells me that she is reluctant to do so because she has a fear of the dentist.  I prescribed Augmentin for her to take BID since she is receiving chemotherapy and she does have some very mild tenderness along the gum line.  I also ordered a panorex.   I ordered the clotrimazole cream for her posterior thigh for her rash.   She has been taking 3gm of tylenol per day for her bone aches due to neulasta.  I recommended she replace 1gm of the Tylenol with Aleve.    [name] will return tomorrow for Neulasta and in one week for labs and evaluation.  The patient has a good understanding of the overall plan. She agrees with it. She knows the goal of treatment in her case is cure. She will call with any problems that may develop before her next visit here.  I spent 25 minutes counseling the patient face to face.  The total time spent in the appointment was 30 minutes.    Minette Headland, [place] [phone] 09/07/2014 1:08 PM

## 2014-09-08 ENCOUNTER — Telehealth: Payer: Self-pay | Admitting: *Deleted

## 2014-09-08 ENCOUNTER — Ambulatory Visit (HOSPITAL_BASED_OUTPATIENT_CLINIC_OR_DEPARTMENT_OTHER): Payer: BC Managed Care – PPO

## 2014-09-08 DIAGNOSIS — C50211 Malignant neoplasm of upper-inner quadrant of right female breast: Secondary | ICD-10-CM

## 2014-09-08 DIAGNOSIS — Z5189 Encounter for other specified aftercare: Secondary | ICD-10-CM

## 2014-09-08 MED ORDER — PEGFILGRASTIM INJECTION 6 MG/0.6ML ~~LOC~~
6.0000 mg | PREFILLED_SYRINGE | Freq: Once | SUBCUTANEOUS | Status: AC
  Administered 2014-09-08: 6 mg via SUBCUTANEOUS
  Filled 2014-09-08: qty 0.6

## 2014-09-08 NOTE — Patient Instructions (Signed)
Pegfilgrastim injection What is this medicine? PEGFILGRASTIM (peg fil GRA stim) is a long-acting granulocyte colony-stimulating factor that stimulates the growth of neutrophils, a type of white blood cell important in the body's fight against infection. It is used to reduce the incidence of fever and infection in patients with certain types of cancer who are receiving chemotherapy that affects the bone marrow. This medicine may be used for other purposes; ask your health care provider or pharmacist if you have questions. COMMON BRAND NAME(S): Neulasta What should I tell my health care provider before I take this medicine? They need to know if you have any of these conditions: -latex allergy -ongoing radiation therapy -sickle cell disease -skin reactions to acrylic adhesives (On-Body Injector only) -an unusual or allergic reaction to pegfilgrastim, filgrastim, other medicines, foods, dyes, or preservatives -pregnant or trying to get pregnant -breast-feeding How should I use this medicine? This medicine is for injection under the skin. If you get this medicine at home, you will be taught how to prepare and give the pre-filled syringe or how to use the On-body Injector. Refer to the patient Instructions for Use for detailed instructions. Use exactly as directed. Take your medicine at regular intervals. Do not take your medicine more often than directed. It is important that you put your used needles and syringes in a special sharps container. Do not put them in a trash can. If you do not have a sharps container, call your pharmacist or healthcare provider to get one. Talk to your pediatrician regarding the use of this medicine in children. Special care may be needed. Overdosage: If you think you have taken too much of this medicine contact a poison control center or emergency room at once. NOTE: This medicine is only for you. Do not share this medicine with others. What if I miss a dose? It is  important not to miss your dose. Call your doctor or health care professional if you miss your dose. If you miss a dose due to an On-body Injector failure or leakage, a new dose should be administered as soon as possible using a single prefilled syringe for manual use. What may interact with this medicine? Interactions have not been studied. Give your health care provider a list of all the medicines, herbs, non-prescription drugs, or dietary supplements you use. Also tell them if you smoke, drink alcohol, or use illegal drugs. Some items may interact with your medicine. This list may not describe all possible interactions. Give your health care provider a list of all the medicines, herbs, non-prescription drugs, or dietary supplements you use. Also tell them if you smoke, drink alcohol, or use illegal drugs. Some items may interact with your medicine. What should I watch for while using this medicine? You may need blood work done while you are taking this medicine. If you are going to need a MRI, CT scan, or other procedure, tell your doctor that you are using this medicine (On-Body Injector only). What side effects may I notice from receiving this medicine? Side effects that you should report to your doctor or health care professional as soon as possible: -allergic reactions like skin rash, itching or hives, swelling of the face, lips, or tongue -dizziness -fever -pain, redness, or irritation at site where injected -pinpoint red spots on the skin -shortness of breath or breathing problems -stomach or side pain, or pain at the shoulder -swelling -tiredness -trouble passing urine Side effects that usually do not require medical attention (report to your doctor   or health care professional if they continue or are bothersome): -bone pain -muscle pain This list may not describe all possible side effects. Call your doctor for medical advice about side effects. You may report side effects to FDA at  1-800-FDA-1088. Where should I keep my medicine? Keep out of the reach of children. Store pre-filled syringes in a refrigerator between 2 and 8 degrees C (36 and 46 degrees F). Do not freeze. Keep in carton to protect from light. Throw away this medicine if it is left out of the refrigerator for more than 48 hours. Throw away any unused medicine after the expiration date. NOTE: This sheet is a summary. It may not cover all possible information. If you have questions about this medicine, talk to your doctor, pharmacist, or health care provider.  2015, [employer]. (2014-01-21 16:14:05)  

## 2014-09-08 NOTE — Telephone Encounter (Signed)
-----   Message from Minette Headland, NP sent at 09/08/2014  8:57 AM EST ----- Please call patient.  Panorex normal.  Thanks,  LC  ----- Message -----    From: Rad Results In Interface    Sent: 09/07/2014   3:31 PM      To: Minette Headland, NP

## 2014-09-08 NOTE — Telephone Encounter (Signed)
Called to inform pt of the results concerning the X-ray that was taken of her mouth. No answer but I left a detailed message to pt's VM letting her know it did not show anything abnormal. Also told pt to call this nurse back@ [phone] if she would like more details about this Panorex results. Message to be forwarded to [place].

## 2014-09-10 ENCOUNTER — Encounter: Payer: Self-pay | Admitting: *Deleted

## 2014-09-10 NOTE — Progress Notes (Signed)
[place] Social Work  Clinical Social Work was referred by chemo education RN to review and complete healthcare advance directives.  Clinical Social Worker met with patient and spouse in [place] office.  The patient designated spouse Tikesha Mort as their primary healthcare agent and Hinda Glatter as their secondary agent.  Patient also completed healthcare living will.    Clinical Social Worker notarized documents and made copies for patient/family. Clinical Social Worker will send documents to medical records to be scanned into patient's chart. Clinical Social Worker encouraged patient/family to contact with any additional questions or concerns.  Polo Riley, MSW, [place] Worker [facility] [phone]

## 2014-09-13 ENCOUNTER — Telehealth: Payer: Self-pay | Admitting: Adult Health

## 2014-09-13 NOTE — Telephone Encounter (Signed)
Faxed medical records to [facility] STD @ [phone]

## 2014-09-14 ENCOUNTER — Ambulatory Visit (HOSPITAL_BASED_OUTPATIENT_CLINIC_OR_DEPARTMENT_OTHER): Payer: BC Managed Care – PPO | Admitting: Adult Health

## 2014-09-14 ENCOUNTER — Telehealth: Payer: Self-pay | Admitting: Nurse Practitioner

## 2014-09-14 ENCOUNTER — Telehealth: Payer: Self-pay | Admitting: *Deleted

## 2014-09-14 ENCOUNTER — Other Ambulatory Visit (HOSPITAL_BASED_OUTPATIENT_CLINIC_OR_DEPARTMENT_OTHER): Payer: BC Managed Care – PPO

## 2014-09-14 ENCOUNTER — Encounter: Payer: Self-pay | Admitting: Adult Health

## 2014-09-14 VITALS — BP 143/85 | HR 117 | Temp 97.8°F | Resp 20 | Ht 60.0 in | Wt 147.3 lb

## 2014-09-14 DIAGNOSIS — K5900 Constipation, unspecified: Secondary | ICD-10-CM

## 2014-09-14 DIAGNOSIS — C50211 Malignant neoplasm of upper-inner quadrant of right female breast: Secondary | ICD-10-CM

## 2014-09-14 DIAGNOSIS — R21 Rash and other nonspecific skin eruption: Secondary | ICD-10-CM

## 2014-09-14 DIAGNOSIS — D709 Neutropenia, unspecified: Secondary | ICD-10-CM

## 2014-09-14 DIAGNOSIS — Z171 Estrogen receptor negative status [ER-]: Secondary | ICD-10-CM

## 2014-09-14 LAB — COMPREHENSIVE METABOLIC PANEL (CC13)
ALT: 17 U/L (ref 0–55)
ANION GAP: 8 meq/L (ref 3–11)
AST: 9 U/L (ref 5–34)
Albumin: 3.4 g/dL — ABNORMAL LOW (ref 3.5–5.0)
Alkaline Phosphatase: 79 U/L (ref 40–150)
BILIRUBIN TOTAL: 0.32 mg/dL (ref 0.20–1.20)
BUN: 15.5 mg/dL (ref 7.0–26.0)
CO2: 26 meq/L (ref 22–29)
Calcium: 9.7 mg/dL (ref 8.4–10.4)
Chloride: 104 mEq/L (ref 98–109)
Creatinine: 0.8 mg/dL (ref 0.6–1.1)
GLUCOSE: 121 mg/dL (ref 70–140)
Potassium: 4 mEq/L (ref 3.5–5.1)
Sodium: 138 mEq/L (ref 136–145)
Total Protein: 6.5 g/dL (ref 6.4–8.3)

## 2014-09-14 LAB — CBC WITH DIFFERENTIAL/PLATELET
BASO%: 0.7 % (ref 0.0–2.0)
Basophils Absolute: 0 10*3/uL (ref 0.0–0.1)
EOS ABS: 0 10*3/uL (ref 0.0–0.5)
EOS%: 0.6 % (ref 0.0–7.0)
HEMATOCRIT: 35 % (ref 34.8–46.6)
HGB: 11.5 g/dL — ABNORMAL LOW (ref 11.6–15.9)
LYMPH%: 64.9 % — AB (ref 14.0–49.7)
MCH: 31.7 pg (ref 25.1–34.0)
MCHC: 33 g/dL (ref 31.5–36.0)
MCV: 96.2 fL (ref 79.5–101.0)
MONO#: 0.1 10*3/uL (ref 0.1–0.9)
MONO%: 24 % — ABNORMAL HIGH (ref 0.0–14.0)
NEUT#: 0 10*3/uL — CL (ref 1.5–6.5)
NEUT%: 9.8 % — ABNORMAL LOW (ref 38.4–76.8)
PLATELETS: 122 10*3/uL — AB (ref 145–400)
RBC: 3.64 10*6/uL — ABNORMAL LOW (ref 3.70–5.45)
RDW: 12.9 % (ref 11.2–14.5)
WBC: 0.5 10*3/uL — AB (ref 3.9–10.3)
lymph#: 0.3 10*3/uL — ABNORMAL LOW (ref 0.9–3.3)

## 2014-09-14 MED ORDER — CIPROFLOXACIN HCL 500 MG PO TABS: 500.0000 mg | ORAL_TABLET | Freq: Two times a day (BID) | ORAL | Status: DC

## 2014-09-14 NOTE — Telephone Encounter (Signed)
, °

## 2014-09-14 NOTE — Progress Notes (Signed)
[place]  Telephone:(336) [phone] [phone]) [phone]     ID: Melissa Garza DOB: 08/21/1956  MR#: [id number]  RXV#:[id number]  Patient Care Team: No Pcp Per Patient as PCP - General (General Practice) Gus Height, MD as Consulting Physician (Obstetrics and Gynecology) Excell Seltzer, MD as Consulting Physician (General Surgery) Chauncey Cruel, MD as Consulting Physician (Oncology) Rexene Edison, MD as Consulting Physician (Radiation Oncology) OTHER MD: Chucky May MD  CHIEF COMPLAINT: Triple negative breast cancer  CURRENT TREATMENT: Neoadjuvant chemotherapy   BREAST CANCER HISTORY: Melissa Garza herself palpated a mass in her right breast approximately mid-September. She brought this to her gynecologist attention and he set her up for bilateral diagnostic mammography at the breast center 08/04/2014. The breast density was category C. in the patient has bilateral saline implants in place. In the palpable area of concern in the right breast there was an irregular mass measuring up to 2.5 cm. By palpation this was firm at the 12:30 o'clock position. Ultrasound of the right breast confirmed an irregular hypoechoic mass in this area measuring 2.5 cm. There was no right axillary lymphadenopathy noted.  Biopsy of the mass in question 08/04/2014 showed ([id number]) invasive ductal carcinoma, grade 3, triple negative, with an MIB-1 of 90%.  Bilateral breast MRI 08/09/2014 showed in the upper inner quadrant of the right breast an irregular enhancing mass measuring 2.8 cm. There was a 4 mm satellite nodule anterior to this and separated from that by 0.6 cm. The left breast, the remaining of the right breast and the lymph node areas were negative.  The patient's subsequent history is as detailed below  INTERVAL HISTORY:  Melissa Garza is here today to receive day 8 cycle 2 of 4 planned cycles of Doxorubicin and Cyclophosphamide. She is doing moderately well today.  Her hair did  fall out and she had the rest of it clipped.  She does endorse constipation.  She was taking Colace, and Senokot S.  She continues to experience constipation.  She would like some suggestions on what to take.  The rash on her leg is improving and she is applying the Lotrizone ointment that I prescribed.  Her tooth pain is improved with taking the Augmentin.  She has not had any more pain.  She was more fatigued with this cycle but states she is feeling better today.  She denies fevers, chills, nausea, vomiting, diarrhea, numbness, tingling, skin changes or any further concerns.     REVIEW OF SYSTEMS: A 10 point review of systems was conducted and is otherwise negative except for what is noted above.     PAST MEDICAL HISTORY: Past Medical History  Diagnosis Date  . Glaucoma   . Anxiety   . Hot flashes   . Depression   . PONV (postoperative nausea and vomiting)   . Headache     chronic  . Shortness of breath     with exertion  . GERD (gastroesophageal reflux disease)     PMH  . Cancer     cancer of right breast  . Hepatitis     Hx: of Hepatitis not sure which one    PAST SURGICAL HISTORY: Past Surgical History  Procedure Laterality Date  . Tonsilectomy/adenoidectomy with myringotomy    . Breast enhancement surgery  2008  . Eye surgery      Lasik  . Colonoscopy    . Breast surgery      Biopsy  . Dilation and curettage of uterus    .  Tubal ligation    . Portacath placement N/A 08/13/2014    Procedure: INSERTION PORT-A-CATH;  Surgeon: Excell Seltzer, MD;  Location: [facility] OR;  Service: General;  Laterality: N/A;    FAMILY HISTORY Family History  Problem Relation Age of Onset  . Psoriasis Mother   . Hypertension Father   . Asthma Other   . Thyroid disease Other    The patient's parents are currently alive, in their late 22s. The patient had no brothers, 2 sisters. There is no history of breast or ovarian cancer in the family to her knowledge.   GYNECOLOGIC HISTORY:  No  LMP recorded. Patient is postmenopausal. Menarche age 38, first live birth age 19. She is GX P2. She stopped having periods in 2005 and started hormone replacement at that time. 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 Her husband, [name], is disabled secondary to multiple cardiac problems. Son [name] this and [name] and works in apartment maintenance. Son Melissa Garza me Melissa Garza is his wife) works as an Clinical biochemist. The patient has 6 grandchildren. She is not a Ambulance person.   ADVANCED DIRECTIVES: Not in place   HEALTH MAINTENANCE: History  Substance Use Topics  . Smoking status: Never Smoker   . Smokeless tobacco: Never Used  . Alcohol Use: No     Colonoscopy:Remote  PAP:2014  Bone density:Never  Lipid panel:  Allergies  Allergen Reactions  . Hydrocodone Hives and Itching    Current Outpatient Prescriptions  Medication Sig Dispense Refill  . ALPRAZolam (XANAX) 1 MG tablet Take 1 mg by mouth 4 (four) times daily as needed for anxiety.    Marland Kitchen CLOBETASOL PROPIONATE EX Apply 1 application topically daily as needed (DRY SCALP).    . clotrimazole-betamethasone (LOTRISONE) cream Apply 1 application topically 2 (two) times daily. 30 g 0  . dexamethasone (DECADRON) 4 MG tablet Take 2 tablets (8 mg total) by mouth as directed. Take two tablets the day after chemotherapy, then two tablets BID for two days. 30 tablet 3  . FLUoxetine (PROZAC) 40 MG capsule Take 40 mg by mouth daily.    Marland Kitchen lidocaine-prilocaine (EMLA) cream Apply 1 application topically as needed. Apply to portacath  1 1/2 - 2 hours as needed prior to procedures. 30 g 0  . LORazepam (ATIVAN) 0.5 MG tablet Take 1 tablet (0.5 mg total) by mouth every 8 (eight) hours. 30 tablet 0  . methylphenidate 54 MG PO CR tablet Take 54 mg by mouth every morning.    . ondansetron (ZOFRAN) 8 MG tablet Take 1  tablet (8 mg total) by mouth every 8 (eight) hours as needed for nausea or vomiting. Start the third day after chemo 20 tablet 0  . prochlorperazine (COMPAZINE) 10 MG tablet Take 1 tablet (10 mg total) by mouth every 6 (six) hours as needed for nausea or vomiting. 30 tablet 0  . travoprost, benzalkonium, (TRAVATAN) 0.004 % ophthalmic solution Place 1 drop into both eyes at bedtime.     . traZODone (DESYREL) 50 MG tablet Take 50 mg by mouth at bedtime as needed for sleep.    Marland Kitchen zolpidem (AMBIEN) 10 MG tablet Take 10 mg by mouth at bedtime as needed for sleep.    Marland Kitchen acetaminophen (TYLENOL) 500 MG tablet Take 1,000 mg by mouth every 6 (six) hours as needed for mild pain.    Marland Kitchen amoxicillin-clavulanate (  AUGMENTIN) 875-125 MG per tablet Take 1 tablet by mouth 2 (two) times daily. 28 tablet 0  . Armodafinil (NUVIGIL) 250 MG tablet Take 250 mg by mouth once a week.     . benzonatate (TESSALON) 200 MG capsule Take 200 mg by mouth 3 (three) times daily as needed for cough.    . butalbital-acetaminophen-caffeine (FIORICET, ESGIC) 50-325-40 MG per tablet Take 1 tablet by mouth 2 (two) times daily as needed for headache.     . famotidine (PEPCID) 20 MG tablet Take 20 mg by mouth daily.    . fluticasone (CUTIVATE) 0.05 % cream Apply 1 application topically daily as needed (psoriasis).     . nystatin-triamcinolone (MYCOLOG II) cream Apply 1 application topically daily as needed.     . Polyvinyl Alcohol-Povidone (REFRESH OP) Place 2 drops into both eyes daily as needed (ALLERGIES).     No current facility-administered medications for this visit.    OBJECTIVE:  Filed Vitals:   09/14/14 0955  BP: 143/85  Pulse: 117  Temp: 97.8 F (36.6 C)  Resp: 20     Body mass index is 28.77 kg/(m^2).    ECOG FS:1 - Symptomatic but completely ambulatory GENERAL: Patient is a well appearing female in no acute distress HEENT:  Sclerae anicteric.  Oropharynx clear and moist. No ulcerations or evidence of oropharyngeal  candidiasis. Neck is supple. Very mild tenderness along the left lower posterior gum line, no swelling noted. NODES:  No cervical, supraclavicular, or axillary lymphadenopathy palpated.  BREAST EXAM:  Deferred. LUNGS:  Clear to auscultation bilaterally.  No wheezes or rhonchi. HEART:  Regular rate and rhythm. No murmur appreciated. ABDOMEN:  Soft, nontender.  Positive, normoactive bowel sounds. No organomegaly palpated. MSK:  No focal spinal tenderness to palpation. Full range of motion bilaterally in the upper extremities. EXTREMITIES:  No peripheral edema.   SKIN: fungal appearing rash on left posterior thigh drying  No nail dyscrasia. NEURO:  Nonfocal. Well oriented.  Appropriate affect.   LAB RESULTS:  CMP     Component Value Date/Time   NA 138 09/14/2014 0934   NA 143 06/17/2009 1137   K 4.0 09/14/2014 0934   K 4.7 06/17/2009 1137   CL 114* 06/17/2009 1137   CO2 26 09/14/2014 0934   CO2 24 06/17/2009 1137   GLUCOSE 121 09/14/2014 0934   GLUCOSE 97 06/17/2009 1137   GLUCOSE 81 10/22/2006 1253   BUN 15.5 09/14/2014 0934   BUN 16 06/17/2009 1137   CREATININE 0.8 09/14/2014 0934   CREATININE 1.2 06/17/2009 1137   CALCIUM 9.7 09/14/2014 0934   CALCIUM 9.4 06/17/2009 1137   PROT 6.5 09/14/2014 0934   PROT 7.0 06/17/2009 1137   ALBUMIN 3.4* 09/14/2014 0934   ALBUMIN 3.9 06/17/2009 1137   AST 9 09/14/2014 0934   AST 15 06/17/2009 1137   ALT 17 09/14/2014 0934   ALT 13 06/17/2009 1137   ALKPHOS 79 09/14/2014 0934   ALKPHOS 55 06/17/2009 1137   BILITOT 0.32 09/14/2014 0934   BILITOT 0.7 06/17/2009 1137   GFRNONAA 49.82 06/17/2009 1137    I No results found for: SPEP  Lab Results  Component Value Date   WBC 0.5* 09/14/2014   NEUTROABS 0.0* 09/14/2014   HGB 11.5* 09/14/2014   HCT 35.0 09/14/2014   MCV 96.2 09/14/2014   PLT 122* 09/14/2014      Chemistry      Component Value Date/Time   NA 138 09/14/2014 0934   NA 143 06/17/2009 1137  K 4.0 09/14/2014 0934     K 4.7 06/17/2009 1137   CL 114* 06/17/2009 1137   CO2 26 09/14/2014 0934   CO2 24 06/17/2009 1137   BUN 15.5 09/14/2014 0934   BUN 16 06/17/2009 1137   CREATININE 0.8 09/14/2014 0934   CREATININE 1.2 06/17/2009 1137      Component Value Date/Time   CALCIUM 9.7 09/14/2014 0934   CALCIUM 9.4 06/17/2009 1137   ALKPHOS 79 09/14/2014 0934   ALKPHOS 55 06/17/2009 1137   AST 9 09/14/2014 0934   AST 15 06/17/2009 1137   ALT 17 09/14/2014 0934   ALT 13 06/17/2009 1137   BILITOT 0.32 09/14/2014 0934   BILITOT 0.7 06/17/2009 1137       No results found for: LABCA2  No components found for: LABCA125  No results for input(s): INR in the last 168 hours.  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  EXAM: BILATERAL BREAST MRI WITH AND WITHOUT CONTRAST  TECHNIQUE: Multiplanar, multisequence MR images of both breasts were obtained prior to and following the intravenous administration of 35ml of MultiHance.  THREE-DIMENSIONAL MR IMAGE RENDERING ON INDEPENDENT WORKSTATION:  Three-dimensional MR images were rendered by post-processing of the original MR data on an independent workstation. The three-dimensional MR images were interpreted, and findings are reported in the following complete MRI report for this study. Three dimensional images were evaluated at the independent DynaCad workstation  COMPARISON:  Recent exams in September 2015.  FINDINGS: Breast composition: c:  Heterogeneous fibroglandular tissue  Background parenchymal enhancement: Moderate  Right breast: There is a subpectoral saline breast implant. In the upper inner quadrant, within the posterior third of the breast parenchyma, is a heterogeneously-enhancing irregular mass with  central biopsy clip artifact. The mass measures 2.2 x 2.2 x 2.8 cm and demonstrates washout kinetics. There is a 4 mm satellite nodule 6 mm directly anterior to the lateral margin of the biopsy-proven carcinoma. The remainder of the right breast is negative.  Left breast: Subpectoral saline breast implant is present. No mass or abnormal enhancement.  Lymph nodes: No abnormal appearing internal mammary chain or axillary lymph nodes are dense fat bilaterally.  Ancillary findings:  None.  IMPRESSION: 1. Biopsy proven carcinoma in the upper inner quadrant of the right breast measures 2.2 x 2.2 x 2.8 cm by MRI. Biopsy clip is positioned within the mass. There is a single 4 mm satellite nodule positioned 6 mm anterior to the lateral margin of the biopsy-proven carcinoma.  2.  No MRI evidence of malignancy in the left breast.  3.  Negative for lymphadenopathy.  RECOMMENDATION: Treatment planning for known right breast carcinoma.  BI-RADS CATEGORY  6: Known biopsy-proven malignancy.   Electronically Signed   By: Curlene Dolphin M.D.   On: 08/09/2014 12:55   Mm Digital Diagnostic Unilat R  08/04/2014   CLINICAL DATA:  Post biopsy of a highly suspicious mass in the upper right breast at 10/1929.  EXAM: DIAGNOSTIC RIGHT MAMMOGRAM POST ULTRASOUND BIOPSY  COMPARISON:  Previous exams  FINDINGS: Mammographic images were obtained following ultrasound guided biopsy of a highly suspicious mass in the right breast at 12:30. A heart shaped biopsy marking clip is present in the targeted region of the mass.  IMPRESSION: Appropriate positioning of Heart 123012 shaped biopsy marking clip post biopsy of a highly suspicious mass in the right breast at 12:30.  Final Assessment: Post Procedure Mammograms for Marker Placement   Electronically Signed   By: Everlean Alstrom M.D.   On: 08/04/2014 10:51   US Breast Ltd Uni Right Inc Axilla  08/04/2014   CLINICAL DATA:  58 year old female with a palpable abnormality in the right breast.  EXAM:  DIGITAL DIAGNOSTIC  BILATERAL MAMMOGRAM WITH CAD  ULTRASOUND RIGHT BREAST  COMPARISON:  Previous exams.  ACR Breast Density Category c: The breast tissue is heterogeneously dense, which may obscure small masses.  FINDINGS: Bilateral saline implants are present. Standard and implant displaced views were performed. Spot compression tangential view over the palpable site of concern in the right breast was performed demonstrating an irregular mass measuring up to 2.5 cm. No suspicious masses or calcifications are seen in the left breast.  Mammographic images were processed with CAD.  Physical examination at site of palpable concern reveals a firm mass at the approximate 12:30 position.  Targeted ultrasound of the right breast was performed demonstrating an irregular hypoechoic mass with increased vascularity at $RemoveBefore'12 30 7 '[id number]$ cm from the nipple measuring 2.2 x 1.3 x 2.5 cm. This corresponds with mammography findings. No definite lymphadenopathy seen in the right axilla.  IMPRESSION: Highly suspicious of right breast mass.  RECOMMENDATION: Ultrasound-guided biopsy of a highly suspicious mass in the upper right breast is recommended. This will subsequently be performed and dictated separately.  I have discussed the findings and recommendations with the patient. Results were also provided in writing at the conclusion of the visit. If applicable, a reminder letter will be sent to the patient regarding the next appointment.  BI-RADS CATEGORY  5: Highly suggestive of malignancy.   Electronically Signed   By: Everlean Alstrom M.D.   On: 08/04/2014 09:57   Mm Diag Breast W/implant Bilateral  08/04/2014   CLINICAL DATA:  58 year old female with a palpable abnormality in the right breast.  EXAM: DIGITAL DIAGNOSTIC  BILATERAL MAMMOGRAM WITH CAD  ULTRASOUND RIGHT BREAST  COMPARISON:  Previous exams.  ACR Breast Density Category c: The breast tissue is heterogeneously dense, which may obscure small masses.  FINDINGS: Bilateral saline  implants are present. Standard and implant displaced views were performed. Spot compression tangential view over the palpable site of concern in the right breast was performed demonstrating an irregular mass measuring up to 2.5 cm. No suspicious masses or calcifications are seen in the left breast.  Mammographic images were processed with CAD.  Physical examination at site of palpable concern reveals a firm mass at the approximate 12:30 position.  Targeted ultrasound of the right breast was performed demonstrating an irregular hypoechoic mass with increased vascularity at $RemoveBefore'12 30 7 '[id number]$ cm from the nipple measuring 2.2 x 1.3 x 2.5 cm. This corresponds with mammography findings. No definite lymphadenopathy seen in the right axilla.  IMPRESSION: Highly suspicious of right breast mass.  RECOMMENDATION: Ultrasound-guided biopsy of a highly suspicious mass in the upper right breast is recommended. This will subsequently be performed and dictated separately.  I have discussed the findings and recommendations with the patient. Results were also provided in writing at the conclusion of the visit. If applicable, a reminder letter will be sent to the patient regarding the next appointment.  BI-RADS CATEGORY  5: Highly suggestive of malignancy.   Electronically Signed   By: Everlean Alstrom M.D.   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 This was found to be concordant by Dr. Andres Shad. Pathology was discussed with the patient by telephone. She reported doing well after the biopsy with tenderness at the site. Post biopsy instructions were reviewed and her questions were answered. She has been scheduled at [facility] on August 11, 2014. A bilateral breast MRI will be scheduled and the  patient will be contacted. She was encouraged to come to [facility] for educational materials. My number was provided for future questions and concerns.  Pathology results reported by Susa Raring RN, BSN on August 05, 2014.   Electronically Signed   By: Everlean Alstrom M.D.   On: 08/05/2014 12:49   08/09/2014   CLINICAL DATA:  58 year old female with a highly suspicious right breast mass.  EXAM: ULTRASOUND GUIDED RIGHT BREAST CORE NEEDLE BIOPSY  COMPARISON:  Previous exams.  PROCEDURE: I met with the patient and we discussed the procedure of ultrasound-guided biopsy, including benefits and alternatives. We discussed the high likelihood of a successful procedure. We discussed the risks of the procedure including infection, bleeding, tissue injury, clip migration, and inadequate sampling. Informed written consent was given. The usual time-out protocol was performed immediately prior to the procedure.  Using sterile technique and 2% Lidocaine as local anesthetic, under direct ultrasound visualization, a 12 gauge vacuum-assisteddevice was used to perform biopsy of a highly suspicious mass in the right breast at 10/1929 using a lateral approach. At the conclusion of the procedure, a Heart shaped tissue marker clip was deployed into the biopsy cavity. Follow-up 2-view mammogram was performed and dictated separately.  IMPRESSION: Ultrasound-guided biopsy of a highly suspicious mass in the right breast. No apparent complications.  Electronically Signed: By: Everlean Alstrom M.D. On: 08/04/2014 10:28    ASSESSMENT: 58 y.o. [place] woman status post right breast upper inner quadrant biopsy 08/04/2014 for a clinical T2 N0, stage IIA invasive ductal carcinoma, grade 3, triple negative, with an MIB-1 of 90%.  PLAN: Melissa Garza is doing well today.  She does have chemotherapy induced neutropenia.  I reviewed her lab work with her.  She was given detailed neutropenic instructions, and I  prescribed Cipro BID for her as well.    Melissa Garza is constipated.  I recommended she take her Senokot S BID in addition to Miralax.    Melissa Garza's rash is improved with the Lotrisone cream.    Melissa Garza's tooth pain is improved with Augmentin.    Melissa Garza will return in one week for labs, an office visit and chemotherapy.    The patient has a good understanding of the overall plan. She agrees with it. She knows the goal of treatment in her case is cure. She will call with any problems that may develop before her next visit here.  I spent 25 minutes counseling the patient face to face.  The total time spent in the appointment was 30 minutes.  Minette Headland, [place] [phone] 09/14/2014 10:29 AM

## 2014-09-14 NOTE — Patient Instructions (Signed)
  Patient Neutropenia Instruction Sheet  Diagnosis: Breast Cancer      Treating Physician: Lurline Del, MD  Treatment: 1. Type of chemotherapy: AC 2. Date of last treatment: 09/07/14  Last Blood Counts: Lab Results  Component Value Date   WBC 0.5* 09/14/2014   HGB 11.5* 09/14/2014   HCT 35.0 09/14/2014   MCV 96.2 09/14/2014   PLT 122* 09/14/2014   ANC 0     Prophylactic Antibiotics: Cipro 500 mg by mouth twice a day Instructions: 1. Monitor temperature and call if fever  greater than 100.5, chills, shaking chills (rigors) 2. Call Physician on-call at [phone] 3. Give him/her symptoms and list of medications that you are taking and your last blood count.  Take the stool softener with laxative twice a day (Senokot-S), take Miralax daily.

## 2014-09-14 NOTE — Telephone Encounter (Signed)
I have adjusted 1/21 appt 

## 2014-09-21 ENCOUNTER — Ambulatory Visit (HOSPITAL_BASED_OUTPATIENT_CLINIC_OR_DEPARTMENT_OTHER): Payer: BC Managed Care – PPO

## 2014-09-21 ENCOUNTER — Telehealth: Payer: Self-pay | Admitting: Adult Health

## 2014-09-21 ENCOUNTER — Encounter: Payer: Self-pay | Admitting: Adult Health

## 2014-09-21 ENCOUNTER — Other Ambulatory Visit (HOSPITAL_BASED_OUTPATIENT_CLINIC_OR_DEPARTMENT_OTHER): Payer: BC Managed Care – PPO

## 2014-09-21 ENCOUNTER — Ambulatory Visit (HOSPITAL_BASED_OUTPATIENT_CLINIC_OR_DEPARTMENT_OTHER): Payer: BC Managed Care – PPO | Admitting: Adult Health

## 2014-09-21 VITALS — BP 109/76 | HR 126 | Temp 98.1°F | Resp 18 | Ht 60.0 in | Wt 151.3 lb

## 2014-09-21 DIAGNOSIS — Z452 Encounter for adjustment and management of vascular access device: Secondary | ICD-10-CM

## 2014-09-21 DIAGNOSIS — C50211 Malignant neoplasm of upper-inner quadrant of right female breast: Secondary | ICD-10-CM

## 2014-09-21 DIAGNOSIS — Z5111 Encounter for antineoplastic chemotherapy: Secondary | ICD-10-CM

## 2014-09-21 DIAGNOSIS — Z171 Estrogen receptor negative status [ER-]: Secondary | ICD-10-CM

## 2014-09-21 DIAGNOSIS — K5900 Constipation, unspecified: Secondary | ICD-10-CM

## 2014-09-21 LAB — CBC WITH DIFFERENTIAL/PLATELET
BASO%: 0.5 % (ref 0.0–2.0)
BASOS ABS: 0.1 10*3/uL (ref 0.0–0.1)
EOS%: 0.1 % (ref 0.0–7.0)
Eosinophils Absolute: 0 10*3/uL (ref 0.0–0.5)
HEMATOCRIT: 35.1 % (ref 34.8–46.6)
HEMOGLOBIN: 11.5 g/dL — AB (ref 11.6–15.9)
LYMPH#: 1.2 10*3/uL (ref 0.9–3.3)
LYMPH%: 11.9 % — ABNORMAL LOW (ref 14.0–49.7)
MCH: 31.7 pg (ref 25.1–34.0)
MCHC: 32.8 g/dL (ref 31.5–36.0)
MCV: 96.7 fL (ref 79.5–101.0)
MONO#: 0.7 10*3/uL (ref 0.1–0.9)
MONO%: 7.6 % (ref 0.0–14.0)
NEUT#: 7.8 10*3/uL — ABNORMAL HIGH (ref 1.5–6.5)
NEUT%: 79.9 % — AB (ref 38.4–76.8)
Platelets: 195 10*3/uL (ref 145–400)
RBC: 3.64 10*6/uL — ABNORMAL LOW (ref 3.70–5.45)
RDW: 13.2 % (ref 11.2–14.5)
WBC: 9.8 10*3/uL (ref 3.9–10.3)

## 2014-09-21 LAB — COMPREHENSIVE METABOLIC PANEL (CC13)
ALBUMIN: 3.2 g/dL — AB (ref 3.5–5.0)
ALT: 21 U/L (ref 0–55)
AST: 13 U/L (ref 5–34)
Alkaline Phosphatase: 84 U/L (ref 40–150)
Anion Gap: 11 mEq/L (ref 3–11)
BUN: 9.9 mg/dL (ref 7.0–26.0)
CHLORIDE: 106 meq/L (ref 98–109)
CO2: 23 mEq/L (ref 22–29)
CREATININE: 1 mg/dL (ref 0.6–1.1)
Calcium: 9.1 mg/dL (ref 8.4–10.4)
Glucose: 111 mg/dl (ref 70–140)
Potassium: 3.8 mEq/L (ref 3.5–5.1)
Sodium: 140 mEq/L (ref 136–145)
Total Bilirubin: <0.2 mg/dL (ref 0.20–1.20)
Total Protein: 6.4 g/dL (ref 6.4–8.3)

## 2014-09-21 MED ORDER — HEPARIN SOD (PORK) LOCK FLUSH 100 UNIT/ML IV SOLN
500.0000 [IU] | Freq: Once | INTRAVENOUS | Status: AC | PRN
  Administered 2014-09-21: 500 [IU]
  Filled 2014-09-21: qty 5

## 2014-09-21 MED ORDER — SODIUM CHLORIDE 0.9 % IV SOLN
600.0000 mg/m2 | Freq: Once | INTRAVENOUS | Status: AC
  Administered 2014-09-21: 1020 mg via INTRAVENOUS
  Filled 2014-09-21: qty 51

## 2014-09-21 MED ORDER — ALTEPLASE 2 MG IJ SOLR
2.0000 mg | Freq: Once | INTRAMUSCULAR | Status: AC | PRN
  Administered 2014-09-21: 2 mg
  Filled 2014-09-21: qty 2

## 2014-09-21 MED ORDER — DOXORUBICIN HCL CHEMO IV INJECTION 2 MG/ML
60.0000 mg/m2 | Freq: Once | INTRAVENOUS | Status: AC
  Administered 2014-09-21: 102 mg via INTRAVENOUS
  Filled 2014-09-21: qty 51

## 2014-09-21 MED ORDER — SODIUM CHLORIDE 0.9 % IV SOLN
Freq: Once | INTRAVENOUS | Status: AC
  Administered 2014-09-21: 13:00:00 via INTRAVENOUS

## 2014-09-21 MED ORDER — SODIUM CHLORIDE 0.9 % IV SOLN
150.0000 mg | Freq: Once | INTRAVENOUS | Status: AC
  Administered 2014-09-21: 150 mg via INTRAVENOUS
  Filled 2014-09-21: qty 5

## 2014-09-21 MED ORDER — PALONOSETRON HCL INJECTION 0.25 MG/5ML
INTRAVENOUS | Status: AC
  Filled 2014-09-21: qty 5

## 2014-09-21 MED ORDER — DEXAMETHASONE SODIUM PHOSPHATE 20 MG/5ML IJ SOLN
INTRAMUSCULAR | Status: AC
  Filled 2014-09-21: qty 5

## 2014-09-21 MED ORDER — DEXAMETHASONE SODIUM PHOSPHATE 20 MG/5ML IJ SOLN
12.0000 mg | Freq: Once | INTRAMUSCULAR | Status: AC
  Administered 2014-09-21: 12 mg via INTRAVENOUS

## 2014-09-21 MED ORDER — PALONOSETRON HCL INJECTION 0.25 MG/5ML
0.2500 mg | Freq: Once | INTRAVENOUS | Status: AC
  Administered 2014-09-21: 0.25 mg via INTRAVENOUS

## 2014-09-21 MED ORDER — SODIUM CHLORIDE 0.9 % IJ SOLN
10.0000 mL | INTRAMUSCULAR | Status: DC | PRN
  Administered 2014-09-21: 10 mL
  Filled 2014-09-21: qty 10

## 2014-09-21 NOTE — Telephone Encounter (Signed)
per pof to sch pt MRI-adv pt CS will call to sch appt-pt understood

## 2014-09-21 NOTE — Patient Instructions (Signed)
[facility] Discharge Instructions for Patients Receiving Chemotherapy  Today you received the following chemotherapy agents adriamycin/cytoxan  To help prevent nausea and vomiting after your treatment, we encourage you to take your nausea medication as directed  If you develop nausea and vomiting that is not controlled by your nausea medication, call the clinic.   BELOW ARE SYMPTOMS THAT SHOULD BE REPORTED IMMEDIATELY:  *FEVER GREATER THAN 100.5 F  *CHILLS WITH OR WITHOUT FEVER  NAUSEA AND VOMITING THAT IS NOT CONTROLLED WITH YOUR NAUSEA MEDICATION  *UNUSUAL SHORTNESS OF BREATH  *UNUSUAL BRUISING OR BLEEDING  TENDERNESS IN MOUTH AND THROAT WITH OR WITHOUT PRESENCE OF ULCERS  *URINARY PROBLEMS  *BOWEL PROBLEMS  UNUSUAL RASH Items with * indicate a potential emergency and should be followed up as soon as possible.  Feel free to call the clinic you have any questions or concerns. The clinic phone number is [phone].  

## 2014-09-21 NOTE — Progress Notes (Signed)
[facility]  [phone]) [phone] Fax:([phone]) [phone]     ID: Melissa Garza DOB: June 15, 1956  MR#: [id number]  WIO#:[id number]  Patient Care Team: No Pcp Per Patient as PCP - General (General Practice) Melissa Height, MD as Consulting Physician (Obstetrics and Gynecology) Melissa Seltzer, MD as Consulting Physician (General Surgery) Melissa Cruel, MD as Consulting Physician (Oncology) Melissa Edison, MD as Consulting Physician (Radiation Oncology) OTHER MD: Melissa May MD  CHIEF COMPLAINT: Triple negative breast cancer  CURRENT TREATMENT: Neoadjuvant chemotherapy   BREAST CANCER HISTORY: Melissa Garza herself palpated a mass in her right breast approximately mid-September. She brought this to her gynecologist attention and he set her up for bilateral diagnostic mammography at the breast center 08/04/2014. The breast density was category C. in the patient has bilateral saline implants in place. In the palpable area of concern in the right breast there was an irregular mass measuring up to 2.5 cm. By palpation this was firm at the 12:30 o'clock position. Ultrasound of the right breast confirmed an irregular hypoechoic mass in this area measuring 2.5 cm. There was no right axillary lymphadenopathy noted.  Biopsy of the mass in question 08/04/2014 showed ([id number]) invasive ductal carcinoma, grade 3, triple negative, with an MIB-1 of 90%.  Bilateral breast MRI 08/09/2014 showed in the upper inner quadrant of the right breast an irregular enhancing mass measuring 2.8 cm. There was a 4 mm satellite nodule anterior to this and separated from that by 0.6 cm. The left breast, the remaining of the right breast and the lymph node areas were negative.  The patient's subsequent history is as detailed below  INTERVAL HISTORY: Melissa Garza is here today for cycle 3 day 1 of treatment with neoadjuvant Doxorubicin and cyclophosphamide.  She is doing well today. She continues to be  constipated and says that she is not taking Miralax as instructed but vows to do so.  She is happy because she can no longer feel her right breast mass.  She is fatigued.  She denies fevers, chills, nausea, vomiting, diarrhea, numbness, tingling, skin changes, mouth pain, or any further concerns.      REVIEW OF SYSTEMS: A 10 point review of systems was conducted and is otherwise negative except for what is noted above.     PAST MEDICAL HISTORY: Past Medical History  Diagnosis Date  . Glaucoma   . Anxiety   . Hot flashes   . Depression   . PONV (postoperative nausea and vomiting)   . Headache     chronic  . Shortness of breath     with exertion  . GERD (gastroesophageal reflux disease)     PMH  . Cancer     cancer of right breast  . Hepatitis     Hx: of Hepatitis not sure which one    PAST SURGICAL HISTORY: Past Surgical History  Procedure Laterality Date  . Tonsilectomy/adenoidectomy with myringotomy    . Breast enhancement surgery  2008  . Eye surgery      Lasik  . Colonoscopy    . Breast surgery      Biopsy  . Dilation and curettage of uterus    . Tubal ligation    . Portacath placement N/A 08/13/2014    Procedure: INSERTION PORT-A-CATH;  Surgeon: Melissa Seltzer, MD;  Location: [facility] [place];  Service: General;  Laterality: N/A;    FAMILY HISTORY Family History  Problem Relation Age of Onset  . Psoriasis Mother   . Hypertension Father   .  Asthma Other   . Thyroid disease Other    The patient's parents are currently alive, in their late 52s. The patient had no brothers, 2 sisters. There is no history of breast or ovarian cancer in the family to her knowledge.   GYNECOLOGIC HISTORY:  No LMP recorded. Patient is postmenopausal. Menarche age 68, first live birth age 37. She is GX P2. She stopped having periods in 2005 and started hormone replacement at that time. 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 Her husband, Melissa Garza, is disabled secondary to multiple cardiac problems. Son Melissa Garza this and Melissa Garza and works in apartment maintenance. Son Melissa Garza me Melissa Garza is his wife) works as an Clinical biochemist. The patient has 6 grandchildren. She is not a Ambulance person.   ADVANCED DIRECTIVES: Not in place   HEALTH MAINTENANCE: History  Substance Use Topics  . Smoking status: Never Smoker   . Smokeless tobacco: Never Used  . Alcohol Use: No     Colonoscopy:Remote  PAP:2014  Bone density:Never  Lipid panel:  Allergies  Allergen Reactions  . Hydrocodone Hives and Itching    Current Outpatient Prescriptions  Medication Sig Dispense Refill  . acetaminophen (TYLENOL) 500 MG tablet Take 1,000 mg by mouth every 6 (six) hours as needed for mild pain.    Marland Kitchen ALPRAZolam (XANAX) 1 MG tablet Take 1 mg by mouth 4 (four) times daily as needed for anxiety.    . Armodafinil (NUVIGIL) 250 MG tablet Take 250 mg by mouth once a week.     . clotrimazole-betamethasone (LOTRISONE) cream Apply 1 application topically 2 (two) times daily. 30 g 0  . dexamethasone (DECADRON) 4 MG tablet Take 2 tablets (8 mg total) by mouth as directed. Take two tablets the day after chemotherapy, then two tablets BID for two days. 30 tablet 3  . famotidine (PEPCID) 20 MG tablet Take 20 mg by mouth daily.    Marland Kitchen FLUoxetine (PROZAC) 40 MG capsule Take 40 mg by mouth daily.    . fluticasone (CUTIVATE) 0.05 % cream Apply 1 application topically daily as needed (psoriasis).     Marland Kitchen lidocaine-prilocaine (EMLA) cream Apply 1 application topically as needed. Apply to portacath  1 1/2 - 2 hours as needed prior to procedures. 30 g 0  . LORazepam (ATIVAN) 0.5 MG tablet Take 1 tablet (0.5 mg total) by mouth every 8 (eight) hours. 30 tablet 0  . methylphenidate 54 MG PO CR tablet Take 54 mg by mouth every morning.    . nystatin-triamcinolone (MYCOLOG II) cream Apply 1  application topically daily as needed.     . ondansetron (ZOFRAN) 8 MG tablet Take 1 tablet (8 mg total) by mouth every 8 (eight) hours as needed for nausea or vomiting. Start the third day after chemo 20 tablet 0  . Polyvinyl Alcohol-Povidone (REFRESH OP) Place 2 drops into both eyes daily as needed (ALLERGIES).    Marland Kitchen travoprost, benzalkonium, (TRAVATAN) 0.004 % ophthalmic solution Place 1 drop into both eyes at bedtime.     . traZODone (DESYREL) 50 MG tablet Take 50 mg by mouth at bedtime as needed for sleep.    Marland Kitchen zolpidem (AMBIEN) 10 MG tablet Take 10 mg by mouth at bedtime as needed for sleep.    . butalbital-acetaminophen-caffeine (FIORICET, ESGIC) 50-325-40 MG per tablet Take 1 tablet by mouth 2 (two) times  daily as needed for headache.     . ciprofloxacin (CIPRO) 500 MG tablet Take 1 tablet (500 mg total) by mouth 2 (two) times daily. 14 tablet 0  . CLOBETASOL PROPIONATE EX Apply 1 application topically daily as needed (DRY SCALP).    Marland Kitchen prochlorperazine (COMPAZINE) 10 MG tablet Take 1 tablet (10 mg total) by mouth every 6 (six) hours as needed for nausea or vomiting. 30 tablet 0   No current facility-administered medications for this visit.    OBJECTIVE:  Filed Vitals:   09/21/14 0939  BP: 109/76  Pulse: 126  Temp: 98.1 F (36.7 C)  Resp: 18     Body mass index is 29.55 kg/(m^2).    ECOG FS:1 - Symptomatic but completely ambulatory GENERAL: Patient is a well appearing female in no acute distress HEENT:  Sclerae anicteric.  Oropharynx clear and moist. No ulcerations or evidence of oropharyngeal candidiasis. Neck is supple. Very mild tenderness along the left lower posterior gum line, no swelling noted. NODES:  No cervical, supraclavicular, or axillary lymphadenopathy palpated.  BREAST EXAM:  Right breast mass soft, difficult to palpate LUNGS:  Clear to auscultation bilaterally.  No wheezes or rhonchi. HEART:  Regular rate and rhythm. No murmur appreciated. ABDOMEN:  Soft, nontender.   Positive, normoactive bowel sounds. No organomegaly palpated. MSK:  No focal spinal tenderness to palpation. Full range of motion bilaterally in the upper extremities. EXTREMITIES:  No peripheral edema.   SKIN: fungal appearing rash on left posterior thigh drying  No nail dyscrasia. NEURO:  Nonfocal. Well oriented.  Appropriate affect.   LAB RESULTS:  CMP     Component Value Date/Time   NA 140 09/21/2014 0919   NA 143 06/17/2009 1137   K 3.8 09/21/2014 0919   K 4.7 06/17/2009 1137   CL 114* 06/17/2009 1137   CO2 23 09/21/2014 0919   CO2 24 06/17/2009 1137   GLUCOSE 111 09/21/2014 0919   GLUCOSE 97 06/17/2009 1137   GLUCOSE 81 10/22/2006 1253   BUN 9.9 09/21/2014 0919   BUN 16 06/17/2009 1137   CREATININE 1.0 09/21/2014 0919   CREATININE 1.2 06/17/2009 1137   CALCIUM 9.1 09/21/2014 0919   CALCIUM 9.4 06/17/2009 1137   PROT 6.4 09/21/2014 0919   PROT 7.0 06/17/2009 1137   ALBUMIN 3.2* 09/21/2014 0919   ALBUMIN 3.9 06/17/2009 1137   AST 13 09/21/2014 0919   AST 15 06/17/2009 1137   ALT 21 09/21/2014 0919   ALT 13 06/17/2009 1137   ALKPHOS 84 09/21/2014 0919   ALKPHOS 55 06/17/2009 1137   BILITOT <0.20 09/21/2014 0919   BILITOT 0.7 06/17/2009 1137   GFRNONAA 49.82 06/17/2009 1137    I No results found for: SPEP  Lab Results  Component Value Date   WBC 9.8 09/21/2014   NEUTROABS 7.8* 09/21/2014   HGB 11.5* 09/21/2014   HCT 35.1 09/21/2014   MCV 96.7 09/21/2014   PLT 195 09/21/2014      Chemistry      Component Value Date/Time   NA 140 09/21/2014 0919   NA 143 06/17/2009 1137   K 3.8 09/21/2014 0919   K 4.7 06/17/2009 1137   CL 114* 06/17/2009 1137   CO2 23 09/21/2014 0919   CO2 24 06/17/2009 1137   BUN 9.9 09/21/2014 0919   BUN 16 06/17/2009 1137   CREATININE 1.0 09/21/2014 0919   CREATININE 1.2 06/17/2009 1137      Component Value Date/Time   CALCIUM 9.1 09/21/2014 0919   CALCIUM  9.4 06/17/2009 1137   ALKPHOS 84 09/21/2014 0919   ALKPHOS 55  06/17/2009 1137   AST 13 09/21/2014 0919   AST 15 06/17/2009 1137   ALT 21 09/21/2014 0919   ALT 13 06/17/2009 1137   BILITOT <0.20 09/21/2014 0919   BILITOT 0.7 06/17/2009 1137       No results found for: LABCA2  No components found for: LABCA125  No results for input(s): INR in the last 168 hours.  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  EXAM: BILATERAL BREAST MRI WITH AND WITHOUT CONTRAST  TECHNIQUE: Multiplanar, multisequence MR images of both breasts were obtained prior to and following the intravenous administration of 81m of MultiHance.  THREE-DIMENSIONAL MR IMAGE RENDERING ON INDEPENDENT WORKSTATION:  Three-dimensional MR images were rendered by post-processing of the original MR data on an independent workstation. The three-dimensional MR images were interpreted, and findings are reported in the following complete MRI report for this study. Three dimensional images were evaluated at the independent DynaCad workstation  COMPARISON:  Recent exams in September 2015.  FINDINGS: Breast composition: c:  Heterogeneous fibroglandular tissue  Background parenchymal enhancement: Moderate  Right breast: There is a subpectoral saline breast implant. In the upper inner quadrant, within the posterior third of the breast parenchyma, is a heterogeneously-enhancing irregular mass with central biopsy clip artifact. The mass measures 2.2 x 2.2 x 2.8 cm and demonstrates washout kinetics. There is a 4 mm satellite nodule 6 mm directly anterior to the lateral margin of the biopsy-proven carcinoma. The remainder of the right breast is negative.  Left breast: Subpectoral saline breast implant is present. No mass or abnormal enhancement.  Lymph nodes:  No abnormal appearing internal mammary chain or axillary lymph nodes are dense fat bilaterally.  Ancillary findings:  None.  IMPRESSION: 1. Biopsy proven carcinoma in the upper inner quadrant of the right breast measures 2.2 x 2.2 x 2.8 cm by MRI. Biopsy clip is positioned within the mass. There is a single 4 mm satellite nodule positioned 6 mm anterior to the lateral margin of the biopsy-proven carcinoma.  2.  No MRI evidence of malignancy in the left breast.  3.  Negative for lymphadenopathy.  RECOMMENDATION: Treatment planning for known right breast carcinoma.  BI-RADS CATEGORY  6: Known biopsy-proven malignancy.   Electronically Signed   By: SCurlene DolphinM.D.   On: 08/09/2014 12:55   Mm Digital Diagnostic Unilat R  08/04/2014   CLINICAL DATA:  Post biopsy of a highly suspicious mass in the upper right breast at 10/1929.  EXAM: DIAGNOSTIC RIGHT MAMMOGRAM POST ULTRASOUND BIOPSY  COMPARISON:  Previous exams  FINDINGS: Mammographic images were obtained following ultrasound guided biopsy of a highly suspicious mass in the right breast at 12:30. A heart shaped biopsy marking clip is present in the targeted region of the mass.  IMPRESSION: Appropriate positioning of Heart 123012 shaped biopsy marking clip post biopsy of a highly suspicious mass in the right breast at 12:30.  Final Assessment: Post Procedure Mammograms for Marker Placement   Electronically Signed   By: JEverlean AlstromM.D.   On: 08/04/2014 10:51   UKoreaBreast Ltd Uni Right Inc Axilla  08/04/2014   CLINICAL DATA:  58year old female with a palpable abnormality  in the right breast.  EXAM: DIGITAL DIAGNOSTIC  BILATERAL MAMMOGRAM WITH CAD  ULTRASOUND RIGHT BREAST  COMPARISON:  Previous exams.  ACR Breast Density Category c: The breast tissue is heterogeneously dense, which Garza obscure small masses.  FINDINGS: Bilateral saline implants are present. Standard and implant displaced views were performed. Spot compression tangential view over the palpable  site of concern in the right breast was performed demonstrating an irregular mass measuring up to 2.5 cm. No suspicious masses or calcifications are seen in the left breast.  Mammographic images were processed with CAD.  Physical examination at site of palpable concern reveals a firm mass at the approximate 12:30 position.  Targeted ultrasound of the right breast was performed demonstrating an irregular hypoechoic mass with increased vascularity at $RemoveBefore'12 30 7 'pniRfXIydglbo$ cm from the nipple measuring 2.2 x 1.3 x 2.5 cm. This corresponds with mammography findings. No definite lymphadenopathy seen in the right axilla.  IMPRESSION: Highly suspicious of right breast mass.  RECOMMENDATION: Ultrasound-guided biopsy of a highly suspicious mass in the upper right breast is recommended. This will subsequently be performed and dictated separately.  I have discussed the findings and recommendations with the patient. Results were also provided in writing at the conclusion of the visit. If applicable, a reminder letter will be sent to the patient regarding the next appointment.  BI-RADS CATEGORY  5: Highly suggestive of malignancy.   Electronically Signed   By: Everlean Alstrom M.D.   On: 08/04/2014 09:57   Mm Diag Breast W/implant Bilateral  08/04/2014   CLINICAL DATA:  58 year old female with a palpable abnormality in the right breast.  EXAM: DIGITAL DIAGNOSTIC  BILATERAL MAMMOGRAM WITH CAD  ULTRASOUND RIGHT BREAST  COMPARISON:  Previous exams.  ACR Breast Density Category c: The breast tissue is heterogeneously dense, which Garza obscure small masses.  FINDINGS: Bilateral saline implants are present. Standard and implant displaced views were performed. Spot compression tangential view over the palpable site of concern in the right breast was performed demonstrating an irregular mass measuring up to 2.5 cm. No suspicious masses or calcifications are seen in the left breast.  Mammographic images were processed with CAD.  Physical examination  at site of palpable concern reveals a firm mass at the approximate 12:30 position.  Targeted ultrasound of the right breast was performed demonstrating an irregular hypoechoic mass with increased vascularity at $RemoveBefore'12 30 7 'uaHkMokzUoEVo$ cm from the nipple measuring 2.2 x 1.3 x 2.5 cm. This corresponds with mammography findings. No definite lymphadenopathy seen in the right axilla.  IMPRESSION: Highly suspicious of right breast mass.  RECOMMENDATION: Ultrasound-guided biopsy of a highly suspicious mass in the upper right breast is recommended. This will subsequently be performed and dictated separately.  I have discussed the findings and recommendations with the patient. Results were also provided in writing at the conclusion of the visit. If applicable, a reminder letter will be sent to the patient regarding the next appointment.  BI-RADS CATEGORY  5: Highly suggestive of malignancy.   Electronically Signed   By: Everlean Alstrom M.D.   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 This was found to be concordant by Dr. Andres Shad. Pathology was discussed with the patient by telephone. She reported doing well after the biopsy with tenderness at the site.  Post biopsy instructions were reviewed and her questions were answered. She has been scheduled at [facility] on August 11, 2014. A bilateral breast MRI will be scheduled and the patient will be contacted. She was encouraged to come to [facility] for educational materials. My number was provided for future questions and concerns.  Pathology results reported by Susa Raring RN, BSN on August 05, 2014.   Electronically Signed   By: Everlean Alstrom M.D.   On: 08/05/2014 12:49   08/09/2014   CLINICAL DATA:  58 year old  female with a highly suspicious right breast mass.  EXAM: ULTRASOUND GUIDED RIGHT BREAST CORE NEEDLE BIOPSY  COMPARISON:  Previous exams.  PROCEDURE: I met with the patient and we discussed the procedure of ultrasound-guided biopsy, including benefits and alternatives. We discussed the high likelihood of a successful procedure. We discussed the risks of the procedure including infection, bleeding, tissue injury, clip migration, and inadequate sampling. Informed written consent was given. The usual time-out protocol was performed immediately prior to the procedure.  Using sterile technique and 2% Lidocaine as local anesthetic, under direct ultrasound visualization, a 12 gauge vacuum-assisteddevice was used to perform biopsy of a highly suspicious mass in the right breast at 10/1929 using a lateral approach. At the conclusion of the procedure, a Heart shaped tissue marker clip was deployed into the biopsy cavity. Follow-up 2-view mammogram was performed and dictated separately.  IMPRESSION: Ultrasound-guided biopsy of a highly suspicious mass in the right breast. No apparent complications.  Electronically Signed: By: Everlean Alstrom M.D. On: 08/04/2014 10:28    ASSESSMENT: 58 y.o. [place] woman status post right breast upper inner quadrant biopsy 08/04/2014 for a clinical T2 N0, stage IIA invasive ductal carcinoma, grade 3, triple negative, with an MIB-1 of 90%.  PLAN:  Katharin is doing well today.  I reviewed her labs with her in detail.  Her blood counts have recovered and she is no longer neutropenic.  She will proceed with Cycle 3 of Doxorubicin/Cyclophosphamide today.    Manuela Schwartz and I discussed her constipation and ways to take Miralax quickly.  She agrees and is planning on doing this daily now.    I ordered a MRI of her breasts for her to undergo around 12/3, after she receives cycle 4 of her Doxorubicin and cyclophosphamide.  I did contact Ebony in our managed care department to go ahead and start  working on prior authorization on this.    Tonnette will return tomorrow for Neulasta and in one week for labs and an office visit to evaluate for any chemotoxicities.   The patient has a good understanding of the overall plan. She agrees with it. She knows the goal of treatment in her case is cure. She will call with any problems that Garza develop before her next visit here.  I spent 25 minutes counseling the patient face to face.  The total time spent in the appointment was 30 minutes.  Minette Headland, [place] [phone] 09/21/2014 10:17 AM

## 2014-09-22 ENCOUNTER — Telehealth: Payer: Self-pay | Admitting: Adult Health

## 2014-09-22 ENCOUNTER — Ambulatory Visit (HOSPITAL_BASED_OUTPATIENT_CLINIC_OR_DEPARTMENT_OTHER): Payer: BC Managed Care – PPO

## 2014-09-22 DIAGNOSIS — C50211 Malignant neoplasm of upper-inner quadrant of right female breast: Secondary | ICD-10-CM

## 2014-09-22 DIAGNOSIS — Z5189 Encounter for other specified aftercare: Secondary | ICD-10-CM

## 2014-09-22 MED ORDER — PEGFILGRASTIM INJECTION 6 MG/0.6ML ~~LOC~~
6.0000 mg | PREFILLED_SYRINGE | Freq: Once | SUBCUTANEOUS | Status: AC
  Administered 2014-09-22: 6 mg via SUBCUTANEOUS
  Filled 2014-09-22: qty 0.6

## 2014-09-22 NOTE — Patient Instructions (Signed)
Pegfilgrastim injection What is this medicine? PEGFILGRASTIM (peg fil GRA stim) is a long-acting granulocyte colony-stimulating factor that stimulates the growth of neutrophils, a type of white blood cell important in the body's fight against infection. It is used to reduce the incidence of fever and infection in patients with certain types of cancer who are receiving chemotherapy that affects the bone marrow. This medicine may be used for other purposes; ask your health care provider or pharmacist if you have questions. COMMON BRAND NAME(S): Neulasta What should I tell my health care provider before I take this medicine? They need to know if you have any of these conditions: -latex allergy -ongoing radiation therapy -sickle cell disease -skin reactions to acrylic adhesives (On-Body Injector only) -an unusual or allergic reaction to pegfilgrastim, filgrastim, other medicines, foods, dyes, or preservatives -pregnant or trying to get pregnant -breast-feeding How should I use this medicine? This medicine is for injection under the skin. If you get this medicine at home, you will be taught how to prepare and give the pre-filled syringe or how to use the On-body Injector. Refer to the patient Instructions for Use for detailed instructions. Use exactly as directed. Take your medicine at regular intervals. Do not take your medicine more often than directed. It is important that you put your used needles and syringes in a special sharps container. Do not put them in a trash can. If you do not have a sharps container, call your pharmacist or healthcare provider to get one. Talk to your pediatrician regarding the use of this medicine in children. Special care may be needed. Overdosage: If you think you have taken too much of this medicine contact a poison control center or emergency room at once. NOTE: This medicine is only for you. Do not share this medicine with others. What if I miss a dose? It is  important not to miss your dose. Call your doctor or health care professional if you miss your dose. If you miss a dose due to an On-body Injector failure or leakage, a new dose should be administered as soon as possible using a single prefilled syringe for manual use. What may interact with this medicine? Interactions have not been studied. Give your health care provider a list of all the medicines, herbs, non-prescription drugs, or dietary supplements you use. Also tell them if you smoke, drink alcohol, or use illegal drugs. Some items may interact with your medicine. This list may not describe all possible interactions. Give your health care provider a list of all the medicines, herbs, non-prescription drugs, or dietary supplements you use. Also tell them if you smoke, drink alcohol, or use illegal drugs. Some items may interact with your medicine. What should I watch for while using this medicine? You may need blood work done while you are taking this medicine. If you are going to need a MRI, CT scan, or other procedure, tell your doctor that you are using this medicine (On-Body Injector only). What side effects may I notice from receiving this medicine? Side effects that you should report to your doctor or health care professional as soon as possible: -allergic reactions like skin rash, itching or hives, swelling of the face, lips, or tongue -dizziness -fever -pain, redness, or irritation at site where injected -pinpoint red spots on the skin -shortness of breath or breathing problems -stomach or side pain, or pain at the shoulder -swelling -tiredness -trouble passing urine Side effects that usually do not require medical attention (report to your doctor   or health care professional if they continue or are bothersome): -bone pain -muscle pain This list may not describe all possible side effects. Call your doctor for medical advice about side effects. You may report side effects to FDA at  1-800-FDA-1088. Where should I keep my medicine? Keep out of the reach of children. Store pre-filled syringes in a refrigerator between 2 and 8 degrees C (36 and 46 degrees F). Do not freeze. Keep in carton to protect from light. Throw away this medicine if it is left out of the refrigerator for more than 48 hours. Throw away any unused medicine after the expiration date. NOTE: This sheet is a summary. It may not cover all possible information. If you have questions about this medicine, talk to your doctor, pharmacist, or health care provider.  2015, [employer]. (2014-01-21 16:14:05)  

## 2014-09-22 NOTE — Telephone Encounter (Signed)
, °

## 2014-09-28 ENCOUNTER — Ambulatory Visit (HOSPITAL_BASED_OUTPATIENT_CLINIC_OR_DEPARTMENT_OTHER): Payer: BC Managed Care – PPO | Admitting: Adult Health

## 2014-09-28 ENCOUNTER — Other Ambulatory Visit (HOSPITAL_BASED_OUTPATIENT_CLINIC_OR_DEPARTMENT_OTHER): Payer: BC Managed Care – PPO

## 2014-09-28 ENCOUNTER — Encounter: Payer: Self-pay | Admitting: Adult Health

## 2014-09-28 VITALS — BP 110/73 | HR 135 | Temp 98.6°F | Resp 18 | Ht 60.0 in | Wt 147.9 lb

## 2014-09-28 DIAGNOSIS — D701 Agranulocytosis secondary to cancer chemotherapy: Secondary | ICD-10-CM

## 2014-09-28 DIAGNOSIS — C50211 Malignant neoplasm of upper-inner quadrant of right female breast: Secondary | ICD-10-CM

## 2014-09-28 DIAGNOSIS — Z171 Estrogen receptor negative status [ER-]: Secondary | ICD-10-CM

## 2014-09-28 DIAGNOSIS — D709 Neutropenia, unspecified: Secondary | ICD-10-CM

## 2014-09-28 LAB — COMPREHENSIVE METABOLIC PANEL (CC13)
ALT: 16 U/L (ref 0–55)
AST: 10 U/L (ref 5–34)
Albumin: 3.2 g/dL — ABNORMAL LOW (ref 3.5–5.0)
Alkaline Phosphatase: 85 U/L (ref 40–150)
Anion Gap: 8 mEq/L (ref 3–11)
BILIRUBIN TOTAL: 0.47 mg/dL (ref 0.20–1.20)
BUN: 15.1 mg/dL (ref 7.0–26.0)
CO2: 26 mEq/L (ref 22–29)
CREATININE: 0.8 mg/dL (ref 0.6–1.1)
Calcium: 9.5 mg/dL (ref 8.4–10.4)
Chloride: 104 mEq/L (ref 98–109)
Glucose: 99 mg/dl (ref 70–140)
Potassium: 4 mEq/L (ref 3.5–5.1)
Sodium: 137 mEq/L (ref 136–145)
Total Protein: 6.1 g/dL — ABNORMAL LOW (ref 6.4–8.3)

## 2014-09-28 LAB — CBC WITH DIFFERENTIAL/PLATELET
BASO%: 6.8 % — AB (ref 0.0–2.0)
BASOS ABS: 0 10*3/uL (ref 0.0–0.1)
EOS%: 0 % (ref 0.0–7.0)
Eosinophils Absolute: 0 10*3/uL (ref 0.0–0.5)
HCT: 31.6 % — ABNORMAL LOW (ref 34.8–46.6)
HEMOGLOBIN: 10.7 g/dL — AB (ref 11.6–15.9)
LYMPH%: 72.7 % — ABNORMAL HIGH (ref 14.0–49.7)
MCH: 31.7 pg (ref 25.1–34.0)
MCHC: 33.9 g/dL (ref 31.5–36.0)
MCV: 93.5 fL (ref 79.5–101.0)
MONO#: 0.1 10*3/uL (ref 0.1–0.9)
MONO%: 13.6 % (ref 0.0–14.0)
NEUT#: 0 10*3/uL — CL (ref 1.5–6.5)
NEUT%: 6.9 % — ABNORMAL LOW (ref 38.4–76.8)
Platelets: 73 10*3/uL — ABNORMAL LOW (ref 145–400)
RBC: 3.38 10*6/uL — AB (ref 3.70–5.45)
RDW: 12.8 % (ref 11.2–14.5)
WBC: 0.4 10*3/uL — CL (ref 3.9–10.3)
lymph#: 0.3 10*3/uL — ABNORMAL LOW (ref 0.9–3.3)
nRBC: 0 % (ref 0–0)

## 2014-09-28 MED ORDER — CIPROFLOXACIN HCL 500 MG PO TABS: 500.0000 mg | ORAL_TABLET | Freq: Two times a day (BID) | ORAL | Status: DC

## 2014-09-28 NOTE — Progress Notes (Signed)
[place]  Telephone:([phone]) [phone] [phone]) [phone]     ID: Melissa Garza DOB: 09-14-1956  MR#: [id number]  JEH#:[id number]  Patient Care Team: No Pcp Per Patient as PCP - General (General Practice) Gus Height, MD as Consulting Physician (Obstetrics and Gynecology) Excell Seltzer, MD as Consulting Physician (General Surgery) Chauncey Cruel, MD as Consulting Physician (Oncology) Rexene Edison, MD as Consulting Physician (Radiation Oncology) OTHER MD: Chucky May MD  CHIEF COMPLAINT: Triple negative breast cancer  CURRENT TREATMENT: Neoadjuvant chemotherapy   BREAST CANCER HISTORY: Melissa Garza herself palpated a mass in her right breast approximately mid-September. She brought this to her gynecologist attention and he set her up for bilateral diagnostic mammography at the breast center 08/04/2014. The breast density was category C. in the patient has bilateral saline implants in place. In the palpable area of concern in the right breast there was an irregular mass measuring up to 2.5 cm. By palpation this was firm at the 12:30 o'clock position. Ultrasound of the right breast confirmed an irregular hypoechoic mass in this area measuring 2.5 cm. There was no right axillary lymphadenopathy noted.  Biopsy of the mass in question 08/04/2014 showed ([id number]) invasive ductal carcinoma, grade 3, triple negative, with an MIB-1 of 90%.  Bilateral breast MRI 08/09/2014 showed in the upper inner quadrant of the right breast an irregular enhancing mass measuring 2.8 cm. There was a 4 mm satellite nodule anterior to this and separated from that by 0.6 cm. The left breast, the remaining of the right breast and the lymph node areas were negative.  The patient's subsequent history is as detailed below  INTERVAL HISTORY: Melissa Garza is here today for cycle 3 day 8 of treatment with neoadjuvant Doxorubicin and cyclophosphamide.  She is doing moderately well today.  She is very  fatigued today.  She says that after showering she had to lie down to rest.  She has mild constipation, and she tried miralax that didn't work.  She hasn't taken any more, and plans on doing this tonight.  She has taken Senokot S.  She hasn't had any luck.  She denies nausea, vomiting, fevers, chills, skin change, numbness, tingling, chest pain, palpitations.      REVIEW OF SYSTEMS: A 10 point review of systems was conducted and is otherwise negative except for what is noted above.     PAST MEDICAL HISTORY: Past Medical History  Diagnosis Date  . Glaucoma   . Anxiety   . Hot flashes   . Depression   . PONV (postoperative nausea and vomiting)   . Headache     chronic  . Shortness of breath     with exertion  . GERD (gastroesophageal reflux disease)     PMH  . Cancer     cancer of right breast  . Hepatitis     Hx: of Hepatitis not sure which one    PAST SURGICAL HISTORY: Past Surgical History  Procedure Laterality Date  . Tonsilectomy/adenoidectomy with myringotomy    . Breast enhancement surgery  2008  . Eye surgery      Lasik  . Colonoscopy    . Breast surgery      Biopsy  . Dilation and curettage of uterus    . Tubal ligation    . Portacath placement N/A 08/13/2014    Procedure: INSERTION PORT-A-CATH;  Surgeon: Excell Seltzer, MD;  Location: [facility];  Service: General;  Laterality: N/A;    FAMILY HISTORY Family History  Problem Relation  Age of Onset  . Psoriasis Mother   . Hypertension Father   . Asthma Other   . Thyroid disease Other    The patient's parents are currently alive, in their late 16s. The patient had no brothers, 2 sisters. There is no history of breast or ovarian cancer in the family to her knowledge.   GYNECOLOGIC HISTORY:  No LMP recorded. Patient is postmenopausal. Menarche age 58, first live birth age 21. She is GX P2. She stopped having periods in 2005 and started hormone replacement at that time. 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 Her husband, [name], is disabled secondary to multiple cardiac problems. Son [name] this and [name] and works in apartment maintenance. Son Sonia Side me [name] is his wife) works as an Clinical biochemist. The patient has 6 grandchildren. She is not a Ambulance person.   ADVANCED DIRECTIVES: Not in place   HEALTH MAINTENANCE: History  Substance Use Topics  . Smoking status: Never Smoker   . Smokeless tobacco: Never Used  . Alcohol Use: No     Colonoscopy:Remote  PAP:2014  Bone density:Never  Lipid panel:  Allergies  Allergen Reactions  . Hydrocodone Hives and Itching    Current Outpatient Prescriptions  Medication Sig Dispense Refill  . ALPRAZolam (XANAX) 1 MG tablet Take 1 mg by mouth 4 (four) times daily as needed for anxiety.    . Armodafinil (NUVIGIL) 250 MG tablet Take 250 mg by mouth once a week.     Marland Kitchen CLOBETASOL PROPIONATE EX Apply 1 application topically daily as needed (DRY SCALP).    . clotrimazole-betamethasone (LOTRISONE) cream Apply 1 application topically 2 (two) times daily. 30 g 0  . famotidine (PEPCID) 20 MG tablet Take 20 mg by mouth daily.    Marland Kitchen FLUoxetine (PROZAC) 40 MG capsule Take 40 mg by mouth daily.    . fluticasone (CUTIVATE) 0.05 % cream Apply 1 application topically daily as needed (psoriasis).     . LORazepam (ATIVAN) 0.5 MG tablet Take 1 tablet (0.5 mg total) by mouth every 8 (eight) hours. 30 tablet 0  . methylphenidate 54 MG PO CR tablet Take 54 mg by mouth every morning.    . ondansetron (ZOFRAN) 8 MG tablet Take 1 tablet (8 mg total) by mouth every 8 (eight) hours as needed for nausea or vomiting. Start the third day after chemo 20 tablet 0  . Polyvinyl Alcohol-Povidone (REFRESH OP) Place 2 drops into both eyes daily as needed (ALLERGIES).    Marland Kitchen travoprost, benzalkonium, (TRAVATAN) 0.004 % ophthalmic solution Place 1 drop  into both eyes at bedtime.     . traZODone (DESYREL) 50 MG tablet Take 50 mg by mouth at bedtime as needed for sleep.    Marland Kitchen zolpidem (AMBIEN) 10 MG tablet Take 10 mg by mouth at bedtime as needed for sleep.    Marland Kitchen acetaminophen (TYLENOL) 500 MG tablet Take 1,000 mg by mouth every 6 (six) hours as needed for mild pain.    . butalbital-acetaminophen-caffeine (FIORICET, ESGIC) 50-325-40 MG per tablet Take 1 tablet by mouth 2 (two) times daily as needed for headache.     . ciprofloxacin (CIPRO) 500 MG tablet Take 1 tablet (500 mg total) by mouth 2 (two) times daily. (Patient not taking: Reported on 09/28/2014) 14 tablet 0  . dexamethasone (DECADRON) 4 MG tablet Take 2 tablets (8 mg  total) by mouth as directed. Take two tablets the day after chemotherapy, then two tablets BID for two days. (Patient not taking: Reported on 09/28/2014) 30 tablet 3  . lidocaine-prilocaine (EMLA) cream Apply 1 application topically as needed. Apply to portacath  1 1/2 - 2 hours as needed prior to procedures. (Patient not taking: Reported on 09/28/2014) 30 g 0  . nystatin-triamcinolone (MYCOLOG II) cream Apply 1 application topically daily as needed.     . prochlorperazine (COMPAZINE) 10 MG tablet Take 1 tablet (10 mg total) by mouth every 6 (six) hours as needed for nausea or vomiting. (Patient not taking: Reported on 09/28/2014) 30 tablet 0   No current facility-administered medications for this visit.    OBJECTIVE:  Filed Vitals:   09/28/14 0904  BP: 110/73  Pulse: 135  Temp: 98.6 F (37 C)  Resp: 18     Body mass index is 28.88 kg/(m^2).    ECOG FS:1 - Symptomatic but completely ambulatory GENERAL: Patient is a well appearing female in no acute distress HEENT:  Sclerae anicteric.  Oropharynx clear and moist. No ulcerations or evidence of oropharyngeal candidiasis. Neck is supple. Very mild tenderness along the left lower posterior gum line, no swelling noted. NODES:  No cervical, supraclavicular, or axillary  lymphadenopathy palpated.  BREAST EXAM:  Right breast mass soft, difficult to palpate LUNGS:  Clear to auscultation bilaterally.  No wheezes or rhonchi. HEART:  HR 120 on re check and regular. No murmur rub or gallop appreciated. ABDOMEN:  Soft, nontender.  Positive, normoactive bowel sounds. No organomegaly palpated. MSK:  No focal spinal tenderness to palpation. Full range of motion bilaterally in the upper extremities. EXTREMITIES:  No peripheral edema.   SKIN: fungal appearing rash on left posterior thigh drying  No nail dyscrasia. NEURO:  Nonfocal. Well oriented.  Appropriate affect.   LAB RESULTS:  CMP     Component Value Date/Time   NA 137 09/28/2014 0853   NA 143 06/17/2009 1137   K 4.0 09/28/2014 0853   K 4.7 06/17/2009 1137   CL 114* 06/17/2009 1137   CO2 26 09/28/2014 0853   CO2 24 06/17/2009 1137   GLUCOSE 99 09/28/2014 0853   GLUCOSE 97 06/17/2009 1137   GLUCOSE 81 10/22/2006 1253   BUN 15.1 09/28/2014 0853   BUN 16 06/17/2009 1137   CREATININE 0.8 09/28/2014 0853   CREATININE 1.2 06/17/2009 1137   CALCIUM 9.5 09/28/2014 0853   CALCIUM 9.4 06/17/2009 1137   PROT 6.1* 09/28/2014 0853   PROT 7.0 06/17/2009 1137   ALBUMIN 3.2* 09/28/2014 0853   ALBUMIN 3.9 06/17/2009 1137   AST 10 09/28/2014 0853   AST 15 06/17/2009 1137   ALT 16 09/28/2014 0853   ALT 13 06/17/2009 1137   ALKPHOS 85 09/28/2014 0853   ALKPHOS 55 06/17/2009 1137   BILITOT 0.47 09/28/2014 0853   BILITOT 0.7 06/17/2009 1137   GFRNONAA 49.82 06/17/2009 1137    I No results found for: SPEP  Lab Results  Component Value Date   WBC 0.4* 09/28/2014   NEUTROABS 0.0* 09/28/2014   HGB 10.7* 09/28/2014   HCT 31.6* 09/28/2014   MCV 93.5 09/28/2014   PLT 73* 09/28/2014      Chemistry      Component Value Date/Time   NA 137 09/28/2014 0853   NA 143 06/17/2009 1137   K 4.0 09/28/2014 0853   K 4.7 06/17/2009 1137   CL 114* 06/17/2009 1137   CO2 26 09/28/2014 0853   CO2  24 06/17/2009 1137     BUN 15.1 09/28/2014 0853   BUN 16 06/17/2009 1137   CREATININE 0.8 09/28/2014 0853   CREATININE 1.2 06/17/2009 1137      Component Value Date/Time   CALCIUM 9.5 09/28/2014 0853   CALCIUM 9.4 06/17/2009 1137   ALKPHOS 85 09/28/2014 0853   ALKPHOS 55 06/17/2009 1137   AST 10 09/28/2014 0853   AST 15 06/17/2009 1137   ALT 16 09/28/2014 0853   ALT 13 06/17/2009 1137   BILITOT 0.47 09/28/2014 0853   BILITOT 0.7 06/17/2009 1137       No results found for: LABCA2  No components found for: LABCA125  No results for input(s): INR in the last 168 hours.  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  EXAM: BILATERAL BREAST MRI WITH AND WITHOUT CONTRAST  TECHNIQUE: Multiplanar, multisequence MR images of both breasts were obtained prior to and following the intravenous administration of 73ml of MultiHance.  THREE-DIMENSIONAL MR IMAGE RENDERING ON INDEPENDENT WORKSTATION:  Three-dimensional MR images were rendered by post-processing of the original MR data on an independent workstation. The three-dimensional MR images were interpreted, and findings are reported in the following complete MRI report for this study. Three dimensional images were evaluated at the independent DynaCad workstation  COMPARISON:  Recent exams in September 2015.  FINDINGS: Breast composition: c:  Heterogeneous fibroglandular tissue  Background parenchymal enhancement: Moderate  Right breast: There is a subpectoral saline breast implant. In the upper inner quadrant, within the posterior third of the breast parenchyma, is a heterogeneously-enhancing irregular mass with central biopsy clip artifact. The mass measures 2.2 x 2.2 x 2.8 cm and demonstrates washout kinetics.  There is a 4 mm satellite nodule 6 mm directly anterior to the lateral margin of the biopsy-proven carcinoma. The remainder of the right breast is negative.  Left breast: Subpectoral saline breast implant is present. No mass or abnormal enhancement.  Lymph nodes: No abnormal appearing internal mammary chain or axillary lymph nodes are dense fat bilaterally.  Ancillary findings:  None.  IMPRESSION: 1. Biopsy proven carcinoma in the upper inner quadrant of the right breast measures 2.2 x 2.2 x 2.8 cm by MRI. Biopsy clip is positioned within the mass. There is a single 4 mm satellite nodule positioned 6 mm anterior to the lateral margin of the biopsy-proven carcinoma.  2.  No MRI evidence of malignancy in the left breast.  3.  Negative for lymphadenopathy.  RECOMMENDATION: Treatment planning for known right breast carcinoma.  BI-RADS CATEGORY  6: Known biopsy-proven malignancy.   Electronically Signed   By: Curlene Dolphin M.D.   On: 08/09/2014 12:55   Mm Digital Diagnostic Unilat R  08/04/2014   CLINICAL DATA:  Post biopsy of a highly suspicious mass in the upper right breast at 10/1929.  EXAM: DIAGNOSTIC RIGHT MAMMOGRAM POST ULTRASOUND BIOPSY  COMPARISON:  Previous exams  FINDINGS: Mammographic images were obtained following ultrasound guided biopsy of a highly suspicious mass in the right breast at 12:30. A heart shaped biopsy marking clip is present in the targeted region of the mass.  IMPRESSION: Appropriate positioning of Heart [id number] shaped biopsy marking clip post biopsy of a highly suspicious mass in the right breast at 12:30.  Final  Assessment: Post Procedure Mammograms for Marker Placement   Electronically Signed   By: Everlean Alstrom M.D.   On: 08/04/2014 10:51   US Breast Ltd Uni Right Inc Axilla  08/04/2014   CLINICAL DATA:  58 year old female with a palpable abnormality in the right breast.  EXAM: DIGITAL DIAGNOSTIC  BILATERAL MAMMOGRAM WITH CAD  ULTRASOUND RIGHT BREAST  COMPARISON:  Previous exams.   ACR Breast Density Category c: The breast tissue is heterogeneously dense, which may obscure small masses.  FINDINGS: Bilateral saline implants are present. Standard and implant displaced views were performed. Spot compression tangential view over the palpable site of concern in the right breast was performed demonstrating an irregular mass measuring up to 2.5 cm. No suspicious masses or calcifications are seen in the left breast.  Mammographic images were processed with CAD.  Physical examination at site of palpable concern reveals a firm mass at the approximate 12:30 position.  Targeted ultrasound of the right breast was performed demonstrating an irregular hypoechoic mass with increased vascularity at 12 30 7  cm from the nipple measuring 2.2 x 1.3 x 2.5 cm. This corresponds with mammography findings. No definite lymphadenopathy seen in the right axilla.  IMPRESSION: Highly suspicious of right breast mass.  RECOMMENDATION: Ultrasound-guided biopsy of a highly suspicious mass in the upper right breast is recommended. This will subsequently be performed and dictated separately.  I have discussed the findings and recommendations with the patient. Results were also provided in writing at the conclusion of the visit. If applicable, a reminder letter will be sent to the patient regarding the next appointment.  BI-RADS CATEGORY  5: Highly suggestive of malignancy.   Electronically Signed   By: Everlean Alstrom M.D.   On: 08/04/2014 09:57   Mm Diag Breast W/implant Bilateral  08/04/2014   CLINICAL DATA:  58 year old female with a palpable abnormality in the right breast.  EXAM: DIGITAL DIAGNOSTIC  BILATERAL MAMMOGRAM WITH CAD  ULTRASOUND RIGHT BREAST  COMPARISON:  Previous exams.  ACR Breast Density Category c: The breast tissue is heterogeneously dense, which may obscure small masses.  FINDINGS: Bilateral saline implants are present. Standard and implant displaced views were performed. Spot compression tangential view  over the palpable site of concern in the right breast was performed demonstrating an irregular mass measuring up to 2.5 cm. No suspicious masses or calcifications are seen in the left breast.  Mammographic images were processed with CAD.  Physical examination at site of palpable concern reveals a firm mass at the approximate 12:30 position.  Targeted ultrasound of the right breast was performed demonstrating an irregular hypoechoic mass with increased vascularity at 12 30 7  cm from the nipple measuring 2.2 x 1.3 x 2.5 cm. This corresponds with mammography findings. No definite lymphadenopathy seen in the right axilla.  IMPRESSION: Highly suspicious of right breast mass.  RECOMMENDATION: Ultrasound-guided biopsy of a highly suspicious mass in the upper right breast is recommended. This will subsequently be performed and dictated separately.  I have discussed the findings and recommendations with the patient. Results were also provided in writing at the conclusion of the visit. If applicable, a reminder letter will be sent to the patient regarding the next appointment.  BI-RADS CATEGORY  5: Highly suggestive of malignancy.   Electronically Signed   By: Everlean Alstrom M.D.   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 This was found to be concordant by Dr. Andres Shad. Pathology was discussed with the patient by telephone. She reported doing well after the biopsy with tenderness at the site. Post biopsy instructions were reviewed and her questions were answered. She has been scheduled at [facility] on August 11, 2014. A bilateral breast MRI will be scheduled and the patient will be contacted. She was encouraged to come to [facility] for educational  materials. My number was provided for future questions and concerns.  Pathology results reported by Susa Raring RN, BSN on August 05, 2014.   Electronically Signed   By: Everlean Alstrom M.D.   On: 08/05/2014 12:49   08/09/2014   CLINICAL DATA:  58 year old female with a highly suspicious right breast mass.  EXAM: ULTRASOUND GUIDED RIGHT BREAST CORE NEEDLE BIOPSY  COMPARISON:  Previous exams.  PROCEDURE: I met with the patient and we discussed the procedure of ultrasound-guided biopsy, including benefits and alternatives. We discussed the high likelihood of a successful procedure. We discussed the risks of the procedure including infection, bleeding, tissue injury, clip migration, and inadequate sampling. Informed written consent was given. The usual time-out protocol was performed immediately prior to the procedure.  Using sterile technique and 2% Lidocaine as local anesthetic, under direct ultrasound visualization, a 12 gauge vacuum-assisteddevice was used to perform biopsy of a highly suspicious mass in the right breast at 10/1929 using a lateral approach. At the conclusion of the procedure, a Heart shaped tissue marker clip was deployed into the biopsy cavity. Follow-up 2-view mammogram was performed and dictated separately.  IMPRESSION: Ultrasound-guided biopsy of a highly suspicious mass in the right breast. No apparent complications.  Electronically Signed: By: Everlean Alstrom M.D. On: 08/04/2014 10:28    ASSESSMENT: 58 y.o. [place] woman status post right breast upper inner quadrant biopsy 08/04/2014 for a clinical T2 N0, stage IIA invasive ductal carcinoma, grade 3, triple negative, with an MIB-1 of 90%.  1. Patient is undergoing neoadjuvant chemotherapy with Doxorubicin and Cyclophosphamide given on day 1 of a 21 day cycle with Neulasta given on day 2 for granulocyte support.    PLAN:  Samariyah is doing moderately well today.  She is neutropenic due to chemotherapy.  She has no signs of febrile  neutropenia.  I gave her detailed neutropenic instruction in her AVS and also prescribed Cipro $RemoveBeforeDE'500mg'[id number]$  PO BID.  She does appear slightly dehydrated, due to her tachycardia, with an auscultated heart rate of about 120.  She tells me that she is "trying" to drink water.  I recommended IV fluids, but she declined today telling me that she has a funeral to attend at 12.  She will really push drinking water, and will call if she feels dizzy or isn't improved by tomorrow and I will arrange for 1L NS over 2 hours.  She knows to call if she develops any signs of febrile neutropenia.    Her next appointment is in one week for labs, evaluation and cycle 4 of Doxorubicin and Cyclophosphamide.  She has an interval MRI of her breasts scheduled for 10/08/14.     The patient has a good understanding of the overall plan. She agrees with it. She knows the goal of treatment in her case is cure. She will call with any problems that may develop before her next visit here.  I spent 25 minutes  counseling the patient face to face.  The total time spent in the appointment was 30 minutes.  Minette Headland, [place] [phone] 09/28/2014 9:45 AM

## 2014-09-28 NOTE — Patient Instructions (Signed)
  Patient Neutropenia Instruction Sheet  Diagnosis: Breast Cancer      Treating Physician: Dr. Jana Hakim  Treatment: 1. Type of chemotherapy: AC 2. Date of last treatment: 09/21/14  Last Blood Counts: Lab Results  Component Value Date   WBC 0.4* 09/28/2014   HGB 10.7* 09/28/2014   HCT 31.6* 09/28/2014   MCV 93.5 09/28/2014   PLT 73* 09/28/2014   ANC 0     Prophylactic Antibiotics: Cipro 500 mg by mouth twice a day Instructions: 1. Monitor temperature and call if fever  greater than 100.5, chills, shaking chills (rigors) 2. Call Physician on-call at [phone] 3. Give him/her symptoms and list of medications that you are taking and your last blood count.

## 2014-09-29 ENCOUNTER — Ambulatory Visit (HOSPITAL_BASED_OUTPATIENT_CLINIC_OR_DEPARTMENT_OTHER): Payer: BC Managed Care – PPO

## 2014-09-29 ENCOUNTER — Other Ambulatory Visit: Payer: Self-pay | Admitting: Adult Health

## 2014-09-29 DIAGNOSIS — E860 Dehydration: Secondary | ICD-10-CM

## 2014-09-29 DIAGNOSIS — R000 Tachycardia, unspecified: Secondary | ICD-10-CM

## 2014-09-29 DIAGNOSIS — C50211 Malignant neoplasm of upper-inner quadrant of right female breast: Secondary | ICD-10-CM

## 2014-09-29 MED ORDER — HEPARIN SOD (PORK) LOCK FLUSH 100 UNIT/ML IV SOLN
500.0000 [IU] | Freq: Once | INTRAVENOUS | Status: AC | PRN
  Administered 2014-09-29: 500 [IU]
  Filled 2014-09-29: qty 5

## 2014-09-29 MED ORDER — ONDANSETRON 8 MG/50ML IVPB (CHCC)
8.0000 mg | Freq: Once | INTRAVENOUS | Status: AC
  Administered 2014-09-29: 8 mg via INTRAVENOUS

## 2014-09-29 MED ORDER — SODIUM CHLORIDE 0.9 % IJ SOLN
10.0000 mL | INTRAMUSCULAR | Status: DC | PRN
  Administered 2014-09-29: 10 mL
  Filled 2014-09-29: qty 10

## 2014-09-29 MED ORDER — ONDANSETRON 8 MG/NS 50 ML IVPB
INTRAVENOUS | Status: AC
  Filled 2014-09-29: qty 8

## 2014-09-29 MED ORDER — SODIUM CHLORIDE 0.9 % IV SOLN
1000.0000 mL | Freq: Once | INTRAVENOUS | Status: AC
  Administered 2014-09-29: 1000 mL via INTRAVENOUS

## 2014-09-29 NOTE — Patient Instructions (Signed)
Dehydration, Adult Dehydration is when you lose more fluids from the body than you take in. Vital organs like the kidneys, brain, and heart cannot function without a proper amount of fluids and salt. Any loss of fluids from the body can cause dehydration.  CAUSES   Vomiting.  Diarrhea.  Excessive sweating.  Excessive urine output.  Fever. SYMPTOMS  Mild dehydration  Thirst.  Dry lips.  Slightly dry mouth. Moderate dehydration  Very dry mouth.  Sunken eyes.  Skin does not bounce back quickly when lightly pinched and released.  Dark urine and decreased urine production.  Decreased tear production.  Headache. Severe dehydration  Very dry mouth.  Extreme thirst.  Rapid, weak pulse (more than 100 beats per minute at rest).  Cold hands and feet.  Not able to sweat in spite of heat and temperature.  Rapid breathing.  Blue lips.  Confusion and lethargy.  Difficulty being awakened.  Minimal urine production.  No tears. DIAGNOSIS  Your caregiver will diagnose dehydration based on your symptoms and your exam. Blood and urine tests will help confirm the diagnosis. The diagnostic evaluation should also identify the cause of dehydration. TREATMENT  Treatment of mild or moderate dehydration can often be done at home by increasing the amount of fluids that you drink. It is best to drink small amounts of fluid more often. Drinking too much at one time can make vomiting worse. Refer to the home care instructions below. Severe dehydration needs to be treated at the hospital where you will probably be given intravenous (IV) fluids that contain water and electrolytes. HOME CARE INSTRUCTIONS   Ask your caregiver about specific rehydration instructions.  Drink enough fluids to keep your urine clear or pale yellow.  Drink small amounts frequently if you have nausea and vomiting.  Eat as you normally do.  Avoid:  Foods or drinks high in sugar.  Carbonated  drinks.  Juice.  Extremely hot or cold fluids.  Drinks with caffeine.  Fatty, greasy foods.  Alcohol.  Tobacco.  Overeating.  Gelatin desserts.  Wash your hands well to avoid spreading bacteria and viruses.  Only take over-the-counter or prescription medicines for pain, discomfort, or fever as directed by your caregiver.  Ask your caregiver if you should continue all prescribed and over-the-counter medicines.  Keep all follow-up appointments with your caregiver. SEEK MEDICAL CARE IF:  You have abdominal pain and it increases or stays in one area (localizes).  You have a rash, stiff neck, or severe headache.  You are irritable, sleepy, or difficult to awaken.  You are weak, dizzy, or extremely thirsty. SEEK IMMEDIATE MEDICAL CARE IF:   You are unable to keep fluids down or you get worse despite treatment.  You have frequent episodes of vomiting or diarrhea.  You have blood or green matter (bile) in your vomit.  You have blood in your stool or your stool looks black and tarry.  You have not urinated in 6 to 8 hours, or you have only urinated a small amount of very dark urine.  You have a fever.  You faint. MAKE SURE YOU:   Understand these instructions.  Will watch your condition.  Will get help right away if you are not doing well or get worse. Document Released: 10/22/2005 Document Revised: 01/14/2012 Document Reviewed: 06/11/2011 ExitCare Patient Information 2015 [employer]. This information is not intended to replace advice given to you by your health care provider. Make sure you discuss any questions you have with your health care   provider.  

## 2014-10-05 ENCOUNTER — Other Ambulatory Visit (HOSPITAL_BASED_OUTPATIENT_CLINIC_OR_DEPARTMENT_OTHER): Payer: BC Managed Care – PPO

## 2014-10-05 ENCOUNTER — Encounter: Payer: Self-pay | Admitting: Nurse Practitioner

## 2014-10-05 ENCOUNTER — Telehealth: Payer: Self-pay | Admitting: Oncology

## 2014-10-05 ENCOUNTER — Ambulatory Visit: Payer: BC Managed Care – PPO | Admitting: Adult Health

## 2014-10-05 ENCOUNTER — Other Ambulatory Visit: Payer: BC Managed Care – PPO

## 2014-10-05 ENCOUNTER — Ambulatory Visit (HOSPITAL_BASED_OUTPATIENT_CLINIC_OR_DEPARTMENT_OTHER): Payer: BC Managed Care – PPO

## 2014-10-05 ENCOUNTER — Ambulatory Visit (HOSPITAL_BASED_OUTPATIENT_CLINIC_OR_DEPARTMENT_OTHER): Payer: BC Managed Care – PPO | Admitting: Nurse Practitioner

## 2014-10-05 VITALS — BP 110/72 | HR 120 | Temp 97.7°F | Resp 19 | Ht 60.0 in | Wt 147.5 lb

## 2014-10-05 DIAGNOSIS — K5900 Constipation, unspecified: Secondary | ICD-10-CM

## 2014-10-05 DIAGNOSIS — Z5111 Encounter for antineoplastic chemotherapy: Secondary | ICD-10-CM

## 2014-10-05 DIAGNOSIS — C50211 Malignant neoplasm of upper-inner quadrant of right female breast: Secondary | ICD-10-CM

## 2014-10-05 DIAGNOSIS — E860 Dehydration: Secondary | ICD-10-CM

## 2014-10-05 DIAGNOSIS — Z171 Estrogen receptor negative status [ER-]: Secondary | ICD-10-CM

## 2014-10-05 LAB — COMPREHENSIVE METABOLIC PANEL (CC13)
ALT: 14 U/L (ref 0–55)
ANION GAP: 9 meq/L (ref 3–11)
AST: 14 U/L (ref 5–34)
Albumin: 3.4 g/dL — ABNORMAL LOW (ref 3.5–5.0)
Alkaline Phosphatase: 87 U/L (ref 40–150)
BUN: 12 mg/dL (ref 7.0–26.0)
CALCIUM: 9.6 mg/dL (ref 8.4–10.4)
CO2: 25 mEq/L (ref 22–29)
CREATININE: 1.1 mg/dL (ref 0.6–1.1)
Chloride: 108 mEq/L (ref 98–109)
GLUCOSE: 77 mg/dL (ref 70–140)
Potassium: 4 mEq/L (ref 3.5–5.1)
Sodium: 143 mEq/L (ref 136–145)
Total Bilirubin: 0.24 mg/dL (ref 0.20–1.20)
Total Protein: 6.7 g/dL (ref 6.4–8.3)

## 2014-10-05 LAB — CBC WITH DIFFERENTIAL/PLATELET
BASO%: 0.2 % (ref 0.0–2.0)
Basophils Absolute: 0 10*3/uL (ref 0.0–0.1)
EOS ABS: 0 10*3/uL (ref 0.0–0.5)
EOS%: 0 % (ref 0.0–7.0)
HCT: 33.3 % — ABNORMAL LOW (ref 34.8–46.6)
HEMOGLOBIN: 11.1 g/dL — AB (ref 11.6–15.9)
LYMPH%: 13.3 % — ABNORMAL LOW (ref 14.0–49.7)
MCH: 31.8 pg (ref 25.1–34.0)
MCHC: 33.3 g/dL (ref 31.5–36.0)
MCV: 95.4 fL (ref 79.5–101.0)
MONO#: 0.8 10*3/uL (ref 0.1–0.9)
MONO%: 9.5 % (ref 0.0–14.0)
NEUT%: 77 % — AB (ref 38.4–76.8)
NEUTROS ABS: 6.2 10*3/uL (ref 1.5–6.5)
Platelets: 222 10*3/uL (ref 145–400)
RBC: 3.49 10*6/uL — ABNORMAL LOW (ref 3.70–5.45)
RDW: 14.2 % (ref 11.2–14.5)
WBC: 8 10*3/uL (ref 3.9–10.3)
lymph#: 1.1 10*3/uL (ref 0.9–3.3)

## 2014-10-05 MED ORDER — SODIUM CHLORIDE 0.9 % IV SOLN
Freq: Once | INTRAVENOUS | Status: AC
  Administered 2014-10-05: 14:00:00 via INTRAVENOUS

## 2014-10-05 MED ORDER — CYCLOPHOSPHAMIDE CHEMO INJECTION 1 GM
600.0000 mg/m2 | Freq: Once | INTRAMUSCULAR | Status: AC
  Administered 2014-10-05: 1020 mg via INTRAVENOUS
  Filled 2014-10-05: qty 51

## 2014-10-05 MED ORDER — DOXORUBICIN HCL CHEMO IV INJECTION 2 MG/ML
60.0000 mg/m2 | Freq: Once | INTRAVENOUS | Status: AC
  Administered 2014-10-05: 102 mg via INTRAVENOUS
  Filled 2014-10-05: qty 51

## 2014-10-05 MED ORDER — SODIUM CHLORIDE 0.9 % IV SOLN
150.0000 mg | Freq: Once | INTRAVENOUS | Status: AC
  Administered 2014-10-05: 150 mg via INTRAVENOUS
  Filled 2014-10-05: qty 5

## 2014-10-05 MED ORDER — HEPARIN SOD (PORK) LOCK FLUSH 100 UNIT/ML IV SOLN
500.0000 [IU] | Freq: Once | INTRAVENOUS | Status: AC | PRN
  Administered 2014-10-05: 500 [IU]
  Filled 2014-10-05: qty 5

## 2014-10-05 MED ORDER — PALONOSETRON HCL INJECTION 0.25 MG/5ML
INTRAVENOUS | Status: AC
  Filled 2014-10-05: qty 5

## 2014-10-05 MED ORDER — PALONOSETRON HCL INJECTION 0.25 MG/5ML
0.2500 mg | Freq: Once | INTRAVENOUS | Status: AC
  Administered 2014-10-05: 0.25 mg via INTRAVENOUS

## 2014-10-05 MED ORDER — DEXAMETHASONE SODIUM PHOSPHATE 20 MG/5ML IJ SOLN
12.0000 mg | Freq: Once | INTRAMUSCULAR | Status: AC
  Administered 2014-10-05: 12 mg via INTRAVENOUS

## 2014-10-05 MED ORDER — DEXAMETHASONE SODIUM PHOSPHATE 20 MG/5ML IJ SOLN
INTRAMUSCULAR | Status: AC
  Filled 2014-10-05: qty 5

## 2014-10-05 MED ORDER — SODIUM CHLORIDE 0.9 % IJ SOLN
10.0000 mL | INTRAMUSCULAR | Status: DC | PRN
  Administered 2014-10-05: 10 mL
  Filled 2014-10-05: qty 10

## 2014-10-05 NOTE — Patient Instructions (Signed)
[facility] Discharge Instructions for Patients Receiving Chemotherapy  Today you received the following chemotherapy agents adriamycin/cytoxan  To help prevent nausea and vomiting after your treatment, we encourage you to take your nausea medication as directed  If you develop nausea and vomiting that is not controlled by your nausea medication, call the clinic.   BELOW ARE SYMPTOMS THAT SHOULD BE REPORTED IMMEDIATELY:  *FEVER GREATER THAN 100.5 F  *CHILLS WITH OR WITHOUT FEVER  NAUSEA AND VOMITING THAT IS NOT CONTROLLED WITH YOUR NAUSEA MEDICATION  *UNUSUAL SHORTNESS OF BREATH  *UNUSUAL BRUISING OR BLEEDING  TENDERNESS IN MOUTH AND THROAT WITH OR WITHOUT PRESENCE OF ULCERS  *URINARY PROBLEMS  *BOWEL PROBLEMS  UNUSUAL RASH Items with * indicate a potential emergency and should be followed up as soon as possible.  Feel free to call the clinic you have any questions or concerns. The clinic phone number is [phone].  

## 2014-10-05 NOTE — Progress Notes (Signed)
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 She brought this to her gynecologist attention and he set her up for bilateral diagnostic mammography at the breast center 08/04/2014. The breast density was category C. in the patient has bilateral saline implants in place. In the palpable area of concern in the right breast there was an irregular mass measuring up to 2.5 cm. By palpation this was firm at the 12:30 o'clock position. Ultrasound of the right breast confirmed an irregular hypoechoic mass in this area measuring 2.5 cm. There was no right axillary lymphadenopathy noted.  Biopsy of the mass in question 08/04/2014 showed ([id number]) invasive ductal carcinoma, grade 3, triple negative, with an MIB-1 of 90%.  Bilateral breast MRI 08/09/2014 showed in the upper inner quadrant of the right breast an irregular enhancing mass measuring 2.8 cm. There was a 4 mm satellite nodule anterior to this and separated from that by 0.6 cm. The left breast, the remaining of the right breast and the lymph node areas were negative.  The patient's subsequent history is as detailed below  INTERVAL HISTORY: Pattiann returns today for follow up of her breast cancer, accompanied by her husband. Today is day 1, cycle 4 of doxorubicin and cyclophosphamide with  neulasta given on day 2 for granulocyte support.   Shakthi is doing moderately well today. Her energy level is improving. She denies fever, chills, nausea, or vomiting. She is taking stool softeners daily for constipation, but is inconsistent with her miralax use. Her appetite was stable up until 2 days ago where she is just not in the mood for much. She was dehydrated with there last round of chemo and is interested in IV fluids later this week. She has no shortness of breat, chest pain, cough, or palpitations. She has no mouth sores, rashes, or peripheral neuropathy symptoms.   REVIEW OF SYSTEMS: A detailed review of systems is otherwise noncontributory, except where noted above.    PAST MEDICAL HISTORY: Past Medical History  Diagnosis Date  . Glaucoma   . Anxiety   . Hot flashes   . Depression   . PONV (postoperative nausea and vomiting)   . Headache     chronic  . Shortness of breath     with exertion  . GERD (gastroesophageal reflux disease)     PMH  . Cancer     cancer of right breast  . Hepatitis     Hx: of Hepatitis not sure which one    PAST SURGICAL HISTORY: Past Surgical History  Procedure Laterality Date  . Tonsilectomy/adenoidectomy with myringotomy    . Breast enhancement surgery  2008  . Eye surgery      Lasik  . Colonoscopy    . Breast surgery      Biopsy  . Dilation and curettage of uterus    . Tubal ligation    . Portacath placement N/A 08/13/2014    Procedure: INSERTION PORT-A-CATH;  Surgeon: Excell Seltzer, MD;  Location: [facility] OR;  Service: General;  Laterality: N/A;    FAMILY HISTORY Family History  Problem Relation Age of Onset  . Psoriasis Mother   . Hypertension Father   . Asthma Other   . Thyroid disease Other    The patient's parents are currently alive, in their late 91s. The patient had no brothers, 2 sisters. There is no history of breast or ovarian cancer in the family to her knowledge.   GYNECOLOGIC HISTORY:  No LMP recorded. Patient  is postmenopausal. Menarche age 34, first live birth age 71. She is GX P2. She stopped having periods in 2005 and started hormone replacement at that time. 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 Her husband, [name], is disabled secondary to multiple cardiac problems. Son [name] this and [name] and works in apartment maintenance. Son Sonia Side me [name] is his wife) works as an Clinical biochemist. The patient has 6 grandchildren. She is not a Ambulance person.   ADVANCED DIRECTIVES: Not in place   HEALTH MAINTENANCE: History  Substance Use Topics  . Smoking status: Never Smoker   . Smokeless tobacco: Never Used  . Alcohol Use: No     Colonoscopy:Remote  PAP:2014  Bone density:Never  Lipid panel:  Allergies  Allergen Reactions  . Hydrocodone Hives and Itching    Current Outpatient Prescriptions  Medication Sig Dispense Refill  . ALPRAZolam (XANAX) 1 MG tablet Take 1 mg by mouth 4 (four) times daily as needed for anxiety.    . butalbital-acetaminophen-caffeine (FIORICET, ESGIC) 50-325-40 MG per tablet Take 1 tablet by mouth 2 (two) times daily as needed for headache.     . CLOBETASOL PROPIONATE EX Apply 1 application topically daily as needed (DRY SCALP).    . clotrimazole-betamethasone (LOTRISONE) cream Apply 1 application topically 2 (two) times daily. 30 g 0  . dexamethasone (DECADRON) 4 MG tablet Take 2 tablets (8 mg total) by mouth as directed. Take two tablets the day after chemotherapy, then two tablets BID for two days. 30 tablet 3  . famotidine (PEPCID) 20 MG tablet Take 20 mg by mouth daily.    Marland Kitchen FLUoxetine (PROZAC) 40 MG capsule Take 40 mg by mouth daily.    Marland Kitchen lidocaine-prilocaine (EMLA) cream Apply 1 application topically as needed. Apply to portacath  1 1/2 - 2 hours as needed prior to procedures. 30 g 0  . LORazepam (ATIVAN) 0.5 MG tablet  Take 1 tablet (0.5 mg total) by mouth every 8 (eight) hours. 30 tablet 0  . methylphenidate 54 MG PO CR tablet Take 54 mg by mouth every morning.    . ondansetron (ZOFRAN) 8 MG tablet Take 1 tablet (8 mg total) by mouth every 8 (eight) hours as needed for nausea or vomiting. Start the third day after chemo 20 tablet 0  . Polyvinyl Alcohol-Povidone (REFRESH OP) Place 2 drops into both eyes daily as needed (ALLERGIES).    Marland Kitchen prochlorperazine (COMPAZINE) 10 MG tablet Take 1 tablet (10 mg total) by mouth every 6 (six) hours as needed for nausea or vomiting. 30 tablet 0  . travoprost, benzalkonium, (TRAVATAN) 0.004 % ophthalmic solution Place 1 drop into both eyes at bedtime.     . traZODone (DESYREL) 50 MG tablet Take 50 mg by mouth at bedtime as needed for sleep.    Marland Kitchen zolpidem (AMBIEN) 10  MG tablet Take 10 mg by mouth at bedtime as needed for sleep.    Marland Kitchen acetaminophen (TYLENOL) 500 MG tablet Take 1,000 mg by mouth every 6 (six) hours as needed for mild pain.    . Armodafinil (NUVIGIL) 250 MG tablet Take 250 mg by mouth once a week.     . ciprofloxacin (CIPRO) 500 MG tablet Take 1 tablet (500 mg total) by mouth 2 (two) times daily. (Patient not taking: Reported on 10/05/2014) 14 tablet 0  . fluticasone (CUTIVATE) 0.05 % cream Apply 1 application topically daily as needed (psoriasis).     . nystatin-triamcinolone (MYCOLOG II) cream Apply 1 application topically daily as needed.      No current facility-administered medications for this visit.    OBJECTIVE:  Filed Vitals:   10/05/14 1304  BP: 110/72  Pulse: 120  Temp: 97.7 F (36.5 C)  Resp: 19     Body mass index is 28.81 kg/(m^2).    ECOG FS:1 - Symptomatic but completely ambulatory GENERAL: Patient is a well appearing female in no acute distress  Skin: warm, dry  HEENT: sclerae anicteric, conjunctivae pink, oropharynx clear. No thrush or mucositis.  Lymph Nodes: No cervical or supraclavicular lymphadenopathy  Lungs: clear to auscultation  bilaterally, no rales, wheezes, or rhonci  Heart: tachycardic rate, no rubs, gallups, or murmurs Abdomen: round, soft, non tender, positive bowel sounds  Musculoskeletal: No focal spinal tenderness, no peripheral edema  Neuro: non focal, well oriented, positive affect  Breasts: deferred    LAB RESULTS:  CMP     Component Value Date/Time   NA 143 10/05/2014 1248   NA 143 06/17/2009 1137   K 4.0 10/05/2014 1248   K 4.7 06/17/2009 1137   CL 114* 06/17/2009 1137   CO2 25 10/05/2014 1248   CO2 24 06/17/2009 1137   GLUCOSE 77 10/05/2014 1248   GLUCOSE 97 06/17/2009 1137   GLUCOSE 81 10/22/2006 1253   BUN 12.0 10/05/2014 1248   BUN 16 06/17/2009 1137   CREATININE 1.1 10/05/2014 1248   CREATININE 1.2 06/17/2009 1137   CALCIUM 9.6 10/05/2014 1248   CALCIUM 9.4 06/17/2009 1137   PROT 6.7 10/05/2014 1248   PROT 7.0 06/17/2009 1137   ALBUMIN 3.4* 10/05/2014 1248   ALBUMIN 3.9 06/17/2009 1137   AST 14 10/05/2014 1248   AST 15 06/17/2009 1137   ALT 14 10/05/2014 1248   ALT 13 06/17/2009 1137   ALKPHOS 87 10/05/2014 1248   ALKPHOS 55 06/17/2009 1137   BILITOT 0.24 10/05/2014 1248   BILITOT 0.7 06/17/2009 1137   GFRNONAA 49.82 06/17/2009 1137    I No results found for: SPEP  Lab Results  Component Value Date   WBC 8.0 10/05/2014   NEUTROABS 6.2 10/05/2014   HGB 11.1* 10/05/2014   HCT 33.3* 10/05/2014   MCV 95.4 10/05/2014   PLT 222 10/05/2014      Chemistry      Component Value Date/Time   NA 143 10/05/2014 1248   NA 143 06/17/2009 1137   K 4.0 10/05/2014 1248   K 4.7 06/17/2009 1137   CL 114* 06/17/2009 1137   CO2 25 10/05/2014 1248   CO2 24 06/17/2009 1137   BUN 12.0 10/05/2014 1248   BUN 16 06/17/2009 1137   CREATININE 1.1 10/05/2014 1248   CREATININE 1.2 06/17/2009 1137      Component Value Date/Time   CALCIUM 9.6 10/05/2014 1248   CALCIUM 9.4 06/17/2009 1137   ALKPHOS 87 10/05/2014 1248   ALKPHOS  55 06/17/2009 1137   AST 14 10/05/2014 1248   AST 15  06/17/2009 1137   ALT 14 10/05/2014 1248   ALT 13 06/17/2009 1137   BILITOT 0.24 10/05/2014 1248   BILITOT 0.7 06/17/2009 1137       No results found for: LABCA2  No components found for: LABCA125  No results for input(s): INR in the last 168 hours.  Urinalysis No results found for: COLORURINE  STUDIES: Dg Orthopantogram  09/07/2014   CLINICAL DATA:  Patient complaining of left lower posterior 2 pain for 1 year on and off. Pain has gotten more constant in association with chemotherapy treatments for breast carcinoma.  EXAM: ORTHOPANTOGRAM/PANORAMIC  COMPARISON:  None.  FINDINGS: No fracture.  No bone lesion.  No periapical lucency is seen along any of the teeth, with specific attention to the left last molar. There multiple dental fillings. No convincing untreated carie.  IMPRESSION: 1. No periapical lucency to suggest a root abscess. No evidence of an active tooth carie. No fracture or bone lesion.   Electronically Signed   By: Lajean Manes M.D.   On: 09/07/2014 15:29    ASSESSMENT: 58 y.o. [place] woman status post right breast upper inner quadrant biopsy 08/04/2014 for a clinical T2 N0, stage IIA invasive ductal carcinoma, grade 3, triple negative, with an MIB-1 of 90%.  1. Patient is undergoing neoadjuvant chemotherapy with doxorubicin and cyclophosphamide dose dense x4 with Neulasta given on day 2 for granulocyte support.  To be followed by paclitaxel weekly x12.  2. Surgery to follow chemo 3. Radiation to follow surgery  PLAN: Judeth is doing moderately well today. The labs were reviewed in detail and were entirely stable. Her ANC has rebounded to WNL. We will proceed with her 4th and final doxorubicin and cyclophosphamide today. She will return for neulasta tomorrow and IV fluids on Thursday to prevent more dehydration. Her midpoint breast MRI will be performed this Friday.   I suggested Jezebel take miralax daily to alleviate her constipation.   Trini will return next week  for labs, a nadir visit, and discussion on the start of paclitaxel weekly x 12. She understands and agrees with this plans. She knows the goal of treatment in her case is cure. She has been encouraged to call with any issues that might arise before her next visit here.    Marcelino Duster, [place] [phone] 10/05/2014 1:43 PM

## 2014-10-05 NOTE — Telephone Encounter (Signed)
pt given appt schedule while in inf. message to desk nurse/HF re books closed for 12/3 and to contact sickle cell clinic for IVF's 12/3.

## 2014-10-06 ENCOUNTER — Telehealth: Payer: Self-pay | Admitting: Oncology

## 2014-10-06 ENCOUNTER — Ambulatory Visit (HOSPITAL_BASED_OUTPATIENT_CLINIC_OR_DEPARTMENT_OTHER): Payer: BC Managed Care – PPO

## 2014-10-06 ENCOUNTER — Other Ambulatory Visit: Payer: Self-pay | Admitting: *Deleted

## 2014-10-06 DIAGNOSIS — Z5189 Encounter for other specified aftercare: Secondary | ICD-10-CM

## 2014-10-06 DIAGNOSIS — C50211 Malignant neoplasm of upper-inner quadrant of right female breast: Secondary | ICD-10-CM

## 2014-10-06 MED ORDER — PEGFILGRASTIM INJECTION 6 MG/0.6ML ~~LOC~~
6.0000 mg | PREFILLED_SYRINGE | Freq: Once | SUBCUTANEOUS | Status: AC
  Administered 2014-10-06: 6 mg via SUBCUTANEOUS
  Filled 2014-10-06: qty 0.6

## 2014-10-06 NOTE — Telephone Encounter (Signed)
[facility] pof to sch pt IVF-sent MW email to get sch-cld N/a-will reply w/Val & pt once reply

## 2014-10-06 NOTE — Patient Instructions (Signed)
Pegfilgrastim injection What is this medicine? PEGFILGRASTIM (peg fil GRA stim) is a long-acting granulocyte colony-stimulating factor that stimulates the growth of neutrophils, a type of white blood cell important in the body's fight against infection. It is used to reduce the incidence of fever and infection in patients with certain types of cancer who are receiving chemotherapy that affects the bone marrow. This medicine may be used for other purposes; ask your health care provider or pharmacist if you have questions. COMMON BRAND NAME(S): Neulasta What should I tell my health care provider before I take this medicine? They need to know if you have any of these conditions: -latex allergy -ongoing radiation therapy -sickle cell disease -skin reactions to acrylic adhesives (On-Body Injector only) -an unusual or allergic reaction to pegfilgrastim, filgrastim, other medicines, foods, dyes, or preservatives -pregnant or trying to get pregnant -breast-feeding How should I use this medicine? This medicine is for injection under the skin. If you get this medicine at home, you will be taught how to prepare and give the pre-filled syringe or how to use the On-body Injector. Refer to the patient Instructions for Use for detailed instructions. Use exactly as directed. Take your medicine at regular intervals. Do not take your medicine more often than directed. It is important that you put your used needles and syringes in a special sharps container. Do not put them in a trash can. If you do not have a sharps container, call your pharmacist or healthcare provider to get one. Talk to your pediatrician regarding the use of this medicine in children. Special care may be needed. Overdosage: If you think you have taken too much of this medicine contact a poison control center or emergency room at once. NOTE: This medicine is only for you. Do not share this medicine with others. What if I miss a dose? It is  important not to miss your dose. Call your doctor or health care professional if you miss your dose. If you miss a dose due to an On-body Injector failure or leakage, a new dose should be administered as soon as possible using a single prefilled syringe for manual use. What may interact with this medicine? Interactions have not been studied. Give your health care provider a list of all the medicines, herbs, non-prescription drugs, or dietary supplements you use. Also tell them if you smoke, drink alcohol, or use illegal drugs. Some items may interact with your medicine. This list may not describe all possible interactions. Give your health care provider a list of all the medicines, herbs, non-prescription drugs, or dietary supplements you use. Also tell them if you smoke, drink alcohol, or use illegal drugs. Some items may interact with your medicine. What should I watch for while using this medicine? You may need blood work done while you are taking this medicine. If you are going to need a MRI, CT scan, or other procedure, tell your doctor that you are using this medicine (On-Body Injector only). What side effects may I notice from receiving this medicine? Side effects that you should report to your doctor or health care professional as soon as possible: -allergic reactions like skin rash, itching or hives, swelling of the face, lips, or tongue -dizziness -fever -pain, redness, or irritation at site where injected -pinpoint red spots on the skin -shortness of breath or breathing problems -stomach or side pain, or pain at the shoulder -swelling -tiredness -trouble passing urine Side effects that usually do not require medical attention (report to your doctor   or health care professional if they continue or are bothersome): -bone pain -muscle pain This list may not describe all possible side effects. Call your doctor for medical advice about side effects. You may report side effects to FDA at  1-800-FDA-1088. Where should I keep my medicine? Keep out of the reach of children. Store pre-filled syringes in a refrigerator between 2 and 8 degrees C (36 and 46 degrees F). Do not freeze. Keep in carton to protect from light. Throw away this medicine if it is left out of the refrigerator for more than 48 hours. Throw away any unused medicine after the expiration date. NOTE: This sheet is a summary. It may not cover all possible information. If you have questions about this medicine, talk to your doctor, pharmacist, or health care provider.  2015, [employer]. (2014-01-21 16:14:05)  

## 2014-10-06 NOTE — Progress Notes (Signed)
Pt in for neulasta today.  Stated that with last treatment she had received fluids and felt better.  She already has an appointment for tomorrow for IVFs but was wondering if she could be put in for fluids on Friday as well.  RN left message on desk nurses' VM to schedule appointment if Dr. Jana Hakim is ok with that

## 2014-10-07 ENCOUNTER — Other Ambulatory Visit: Payer: Self-pay | Admitting: Emergency Medicine

## 2014-10-07 ENCOUNTER — Ambulatory Visit (HOSPITAL_BASED_OUTPATIENT_CLINIC_OR_DEPARTMENT_OTHER): Payer: BC Managed Care – PPO

## 2014-10-07 DIAGNOSIS — C50211 Malignant neoplasm of upper-inner quadrant of right female breast: Secondary | ICD-10-CM

## 2014-10-07 DIAGNOSIS — E860 Dehydration: Secondary | ICD-10-CM

## 2014-10-07 MED ORDER — HEPARIN SOD (PORK) LOCK FLUSH 100 UNIT/ML IV SOLN
500.0000 [IU] | Freq: Once | INTRAVENOUS | Status: AC | PRN
  Administered 2014-10-07: 500 [IU]
  Filled 2014-10-07: qty 5

## 2014-10-07 MED ORDER — ONDANSETRON 8 MG/50ML IVPB (CHCC)
8.0000 mg | Freq: Once | INTRAVENOUS | Status: AC
  Administered 2014-10-07: 8 mg via INTRAVENOUS

## 2014-10-07 MED ORDER — SODIUM CHLORIDE 0.9 % IJ SOLN
10.0000 mL | INTRAMUSCULAR | Status: DC | PRN
  Administered 2014-10-07: 10 mL
  Filled 2014-10-07: qty 10

## 2014-10-07 MED ORDER — SODIUM CHLORIDE 0.9 % IV SOLN
Freq: Once | INTRAVENOUS | Status: AC
  Administered 2014-10-07: 10:00:00 via INTRAVENOUS

## 2014-10-07 NOTE — Patient Instructions (Signed)
Dehydration, Adult Dehydration is when you lose more fluids from the body than you take in. Vital organs like the kidneys, brain, and heart cannot function without a proper amount of fluids and salt. Any loss of fluids from the body can cause dehydration.  CAUSES   Vomiting.  Diarrhea.  Excessive sweating.  Excessive urine output.  Fever. SYMPTOMS  Mild dehydration  Thirst.  Dry lips.  Slightly dry mouth. Moderate dehydration  Very dry mouth.  Sunken eyes.  Skin does not bounce back quickly when lightly pinched and released.  Dark urine and decreased urine production.  Decreased tear production.  Headache. Severe dehydration  Very dry mouth.  Extreme thirst.  Rapid, weak pulse (more than 100 beats per minute at rest).  Cold hands and feet.  Not able to sweat in spite of heat and temperature.  Rapid breathing.  Blue lips.  Confusion and lethargy.  Difficulty being awakened.  Minimal urine production.  No tears. DIAGNOSIS  Your caregiver will diagnose dehydration based on your symptoms and your exam. Blood and urine tests will help confirm the diagnosis. The diagnostic evaluation should also identify the cause of dehydration. TREATMENT  Treatment of mild or moderate dehydration can often be done at home by increasing the amount of fluids that you drink. It is best to drink small amounts of fluid more often. Drinking too much at one time can make vomiting worse. Refer to the home care instructions below. Severe dehydration needs to be treated at the hospital where you will probably be given intravenous (IV) fluids that contain water and electrolytes. HOME CARE INSTRUCTIONS   Ask your caregiver about specific rehydration instructions.  Drink enough fluids to keep your urine clear or pale yellow.  Drink small amounts frequently if you have nausea and vomiting.  Eat as you normally do.  Avoid:  Foods or drinks high in sugar.  Carbonated  drinks.  Juice.  Extremely hot or cold fluids.  Drinks with caffeine.  Fatty, greasy foods.  Alcohol.  Tobacco.  Overeating.  Gelatin desserts.  Wash your hands well to avoid spreading bacteria and viruses.  Only take over-the-counter or prescription medicines for pain, discomfort, or fever as directed by your caregiver.  Ask your caregiver if you should continue all prescribed and over-the-counter medicines.  Keep all follow-up appointments with your caregiver. SEEK MEDICAL CARE IF:  You have abdominal pain and it increases or stays in one area (localizes).  You have a rash, stiff neck, or severe headache.  You are irritable, sleepy, or difficult to awaken.  You are weak, dizzy, or extremely thirsty. SEEK IMMEDIATE MEDICAL CARE IF:   You are unable to keep fluids down or you get worse despite treatment.  You have frequent episodes of vomiting or diarrhea.  You have blood or green matter (bile) in your vomit.  You have blood in your stool or your stool looks black and tarry.  You have not urinated in 6 to 8 hours, or you have only urinated a small amount of very dark urine.  You have a fever.  You faint. MAKE SURE YOU:   Understand these instructions.  Will watch your condition.  Will get help right away if you are not doing well or get worse. Document Released: 10/22/2005 Document Revised: 01/14/2012 Document Reviewed: 06/11/2011 ExitCare Patient Information 2015 [employer]. This information is not intended to replace advice given to you by your health care provider. Make sure you discuss any questions you have with your health care   provider.  

## 2014-10-08 ENCOUNTER — Ambulatory Visit (HOSPITAL_COMMUNITY)
Admission: RE | Admit: 2014-10-08 | Discharge: 2014-10-08 | Disposition: A | Discharge status: Home / Self Care | Payer: BC Managed Care – PPO | Source: Ambulatory Visit | Attending: Adult Health | Admitting: Adult Health

## 2014-10-08 ENCOUNTER — Ambulatory Visit (HOSPITAL_BASED_OUTPATIENT_CLINIC_OR_DEPARTMENT_OTHER): Payer: BC Managed Care – PPO

## 2014-10-08 DIAGNOSIS — E860 Dehydration: Secondary | ICD-10-CM

## 2014-10-08 DIAGNOSIS — C50911 Malignant neoplasm of unspecified site of right female breast: Secondary | ICD-10-CM | POA: Diagnosis present

## 2014-10-08 DIAGNOSIS — C50211 Malignant neoplasm of upper-inner quadrant of right female breast: Secondary | ICD-10-CM

## 2014-10-08 MED ORDER — GADOBENATE DIMEGLUMINE 529 MG/ML IV SOLN
13.0000 mL | Freq: Once | INTRAVENOUS | Status: AC | PRN
  Administered 2014-10-08: 13 mL via INTRAVENOUS

## 2014-10-08 MED ORDER — SODIUM CHLORIDE 0.9 % IV SOLN
1000.0000 mL | Freq: Once | INTRAVENOUS | Status: AC
  Administered 2014-10-08: 12:00:00 via INTRAVENOUS

## 2014-10-08 MED ORDER — HEPARIN SOD (PORK) LOCK FLUSH 100 UNIT/ML IV SOLN
500.0000 [IU] | Freq: Once | INTRAVENOUS | Status: AC | PRN
  Administered 2014-10-08: 500 [IU]
  Filled 2014-10-08: qty 5

## 2014-10-08 MED ORDER — SODIUM CHLORIDE 0.9 % IJ SOLN
10.0000 mL | INTRAMUSCULAR | Status: DC | PRN
  Administered 2014-10-08: 10 mL
  Filled 2014-10-08: qty 10

## 2014-10-08 MED ORDER — ONDANSETRON 8 MG/50ML IVPB (CHCC)
8.0000 mg | Freq: Once | INTRAVENOUS | Status: AC
  Administered 2014-10-08: 8 mg via INTRAVENOUS

## 2014-10-08 MED ORDER — ONDANSETRON 8 MG/NS 50 ML IVPB
INTRAVENOUS | Status: AC
  Filled 2014-10-08: qty 8

## 2014-10-08 NOTE — Patient Instructions (Signed)
Dehydration, Adult Dehydration is when you lose more fluids from the body than you take in. Vital organs like the kidneys, brain, and heart cannot function without a proper amount of fluids and salt. Any loss of fluids from the body can cause dehydration.  CAUSES   Vomiting.  Diarrhea.  Excessive sweating.  Excessive urine output.  Fever. SYMPTOMS  Mild dehydration  Thirst.  Dry lips.  Slightly dry mouth. Moderate dehydration  Very dry mouth.  Sunken eyes.  Skin does not bounce back quickly when lightly pinched and released.  Dark urine and decreased urine production.  Decreased tear production.  Headache. Severe dehydration  Very dry mouth.  Extreme thirst.  Rapid, weak pulse (more than 100 beats per minute at rest).  Cold hands and feet.  Not able to sweat in spite of heat and temperature.  Rapid breathing.  Blue lips.  Confusion and lethargy.  Difficulty being awakened.  Minimal urine production.  No tears. DIAGNOSIS  Your caregiver will diagnose dehydration based on your symptoms and your exam. Blood and urine tests will help confirm the diagnosis. The diagnostic evaluation should also identify the cause of dehydration. TREATMENT  Treatment of mild or moderate dehydration can often be done at home by increasing the amount of fluids that you drink. It is best to drink small amounts of fluid more often. Drinking too much at one time can make vomiting worse. Refer to the home care instructions below. Severe dehydration needs to be treated at the hospital where you will probably be given intravenous (IV) fluids that contain water and electrolytes. HOME CARE INSTRUCTIONS   Ask your caregiver about specific rehydration instructions.  Drink enough fluids to keep your urine clear or pale yellow.  Drink small amounts frequently if you have nausea and vomiting.  Eat as you normally do.  Avoid:  Foods or drinks high in sugar.  Carbonated  drinks.  Juice.  Extremely hot or cold fluids.  Drinks with caffeine.  Fatty, greasy foods.  Alcohol.  Tobacco.  Overeating.  Gelatin desserts.  Wash your hands well to avoid spreading bacteria and viruses.  Only take over-the-counter or prescription medicines for pain, discomfort, or fever as directed by your caregiver.  Ask your caregiver if you should continue all prescribed and over-the-counter medicines.  Keep all follow-up appointments with your caregiver. SEEK MEDICAL CARE IF:  You have abdominal pain and it increases or stays in one area (localizes).  You have a rash, stiff neck, or severe headache.  You are irritable, sleepy, or difficult to awaken.  You are weak, dizzy, or extremely thirsty. SEEK IMMEDIATE MEDICAL CARE IF:   You are unable to keep fluids down or you get worse despite treatment.  You have frequent episodes of vomiting or diarrhea.  You have blood or green matter (bile) in your vomit.  You have blood in your stool or your stool looks black and tarry.  You have not urinated in 6 to 8 hours, or you have only urinated a small amount of very dark urine.  You have a fever.  You faint. MAKE SURE YOU:   Understand these instructions.  Will watch your condition.  Will get help right away if you are not doing well or get worse. Document Released: 10/22/2005 Document Revised: 01/14/2012 Document Reviewed: 06/11/2011 ExitCare Patient Information 2015 [employer]. This information is not intended to replace advice given to you by your health care provider. Make sure you discuss any questions you have with your health care   provider.  

## 2014-10-12 ENCOUNTER — Ambulatory Visit (HOSPITAL_BASED_OUTPATIENT_CLINIC_OR_DEPARTMENT_OTHER): Payer: BC Managed Care – PPO | Admitting: Nurse Practitioner

## 2014-10-12 ENCOUNTER — Ambulatory Visit: Payer: BC Managed Care – PPO | Admitting: Adult Health

## 2014-10-12 ENCOUNTER — Encounter: Payer: Self-pay | Admitting: Nurse Practitioner

## 2014-10-12 ENCOUNTER — Other Ambulatory Visit: Payer: BC Managed Care – PPO

## 2014-10-12 ENCOUNTER — Ambulatory Visit (HOSPITAL_BASED_OUTPATIENT_CLINIC_OR_DEPARTMENT_OTHER): Payer: BC Managed Care – PPO

## 2014-10-12 ENCOUNTER — Other Ambulatory Visit (HOSPITAL_BASED_OUTPATIENT_CLINIC_OR_DEPARTMENT_OTHER): Payer: BC Managed Care – PPO

## 2014-10-12 VITALS — BP 103/66 | HR 140 | Temp 98.3°F | Resp 21 | Ht 60.0 in | Wt 144.7 lb

## 2014-10-12 VITALS — BP 111/72 | HR 108 | Temp 98.9°F | Resp 18

## 2014-10-12 DIAGNOSIS — E860 Dehydration: Secondary | ICD-10-CM

## 2014-10-12 DIAGNOSIS — C50211 Malignant neoplasm of upper-inner quadrant of right female breast: Secondary | ICD-10-CM

## 2014-10-12 DIAGNOSIS — T451X5A Adverse effect of antineoplastic and immunosuppressive drugs, initial encounter: Secondary | ICD-10-CM

## 2014-10-12 DIAGNOSIS — D701 Agranulocytosis secondary to cancer chemotherapy: Secondary | ICD-10-CM

## 2014-10-12 LAB — COMPREHENSIVE METABOLIC PANEL (CC13)
ALBUMIN: 3.6 g/dL (ref 3.5–5.0)
ALT: 13 U/L (ref 0–55)
AST: 10 U/L (ref 5–34)
Alkaline Phosphatase: 80 U/L (ref 40–150)
Anion Gap: 13 mEq/L — ABNORMAL HIGH (ref 3–11)
BUN: 14.2 mg/dL (ref 7.0–26.0)
CO2: 24 mEq/L (ref 22–29)
Calcium: 9.8 mg/dL (ref 8.4–10.4)
Chloride: 101 mEq/L (ref 98–109)
Creatinine: 0.9 mg/dL (ref 0.6–1.1)
EGFR: 74 mL/min/{1.73_m2} — ABNORMAL LOW (ref >90)
Glucose: 107 mg/dl (ref 70–140)
POTASSIUM: 4.4 meq/L (ref 3.5–5.1)
Sodium: 138 mEq/L (ref 136–145)
Total Bilirubin: 0.61 mg/dL (ref 0.20–1.20)
Total Protein: 6.6 g/dL (ref 6.4–8.3)

## 2014-10-12 LAB — CBC WITH DIFFERENTIAL/PLATELET
BASO%: 4.6 % — ABNORMAL HIGH (ref 0.0–2.0)
BASOS ABS: 0 10*3/uL (ref 0.0–0.1)
EOS%: 2.6 % (ref 0.0–7.0)
Eosinophils Absolute: 0 10*3/uL (ref 0.0–0.5)
HCT: 33 % — ABNORMAL LOW (ref 34.8–46.6)
HGB: 10.9 g/dL — ABNORMAL LOW (ref 11.6–15.9)
LYMPH%: 74.3 % — ABNORMAL HIGH (ref 14.0–49.7)
MCH: 31.3 pg (ref 25.1–34.0)
MCHC: 33 g/dL (ref 31.5–36.0)
MCV: 94.7 fL (ref 79.5–101.0)
MONO#: 0.1 10*3/uL (ref 0.1–0.9)
MONO%: 12.7 % (ref 0.0–14.0)
NEUT#: 0 10*3/uL — CL (ref 1.5–6.5)
NEUT%: 5.8 % — ABNORMAL LOW (ref 38.4–76.8)
Platelets: 81 10*3/uL — ABNORMAL LOW (ref 145–400)
RBC: 3.48 10*6/uL — AB (ref 3.70–5.45)
RDW: 14 % (ref 11.2–14.5)
WBC: 0.4 10*3/uL — CL (ref 3.9–10.3)
lymph#: 0.3 10*3/uL — ABNORMAL LOW (ref 0.9–3.3)

## 2014-10-12 MED ORDER — SODIUM CHLORIDE 0.9 % IJ SOLN
10.0000 mL | INTRAMUSCULAR | Status: DC | PRN
Start: 2014-10-12 — End: 2014-10-12
  Administered 2014-10-12: 10 mL
  Filled 2014-10-12: qty 10

## 2014-10-12 MED ORDER — SODIUM CHLORIDE 0.9 % IV SOLN
Freq: Once | INTRAVENOUS | Status: AC
  Administered 2014-10-12: 17:00:00 via INTRAVENOUS

## 2014-10-12 MED ORDER — SODIUM CHLORIDE 0.9 % IV SOLN
Freq: Once | INTRAVENOUS | Status: AC
  Administered 2014-10-12: 16:00:00 via INTRAVENOUS

## 2014-10-12 MED ORDER — HEPARIN SOD (PORK) LOCK FLUSH 100 UNIT/ML IV SOLN
500.0000 [IU] | Freq: Once | INTRAVENOUS | Status: AC | PRN
  Administered 2014-10-12: 500 [IU]
  Filled 2014-10-12: qty 5

## 2014-10-12 MED ORDER — CIPROFLOXACIN HCL 500 MG PO TABS: 500.0000 mg | ORAL_TABLET | Freq: Two times a day (BID) | ORAL | Status: DC

## 2014-10-12 NOTE — Patient Instructions (Signed)
Dehydration, Adult Dehydration is when you lose more fluids from the body than you take in. Vital organs like the kidneys, brain, and heart cannot function without a proper amount of fluids and salt. Any loss of fluids from the body can cause dehydration.  CAUSES   Vomiting.  Diarrhea.  Excessive sweating.  Excessive urine output.  Fever. SYMPTOMS  Mild dehydration  Thirst.  Dry lips.  Slightly dry mouth. Moderate dehydration  Very dry mouth.  Sunken eyes.  Skin does not bounce back quickly when lightly pinched and released.  Dark urine and decreased urine production.  Decreased tear production.  Headache. Severe dehydration  Very dry mouth.  Extreme thirst.  Rapid, weak pulse (more than 100 beats per minute at rest).  Cold hands and feet.  Not able to sweat in spite of heat and temperature.  Rapid breathing.  Blue lips.  Confusion and lethargy.  Difficulty being awakened.  Minimal urine production.  No tears. DIAGNOSIS  Your caregiver will diagnose dehydration based on your symptoms and your exam. Blood and urine tests will help confirm the diagnosis. The diagnostic evaluation should also identify the cause of dehydration. TREATMENT  Treatment of mild or moderate dehydration can often be done at home by increasing the amount of fluids that you drink. It is best to drink small amounts of fluid more often. Drinking too much at one time can make vomiting worse. Refer to the home care instructions below. Severe dehydration needs to be treated at the hospital where you will probably be given intravenous (IV) fluids that contain water and electrolytes. HOME CARE INSTRUCTIONS   Ask your caregiver about specific rehydration instructions.  Drink enough fluids to keep your urine clear or pale yellow.  Drink small amounts frequently if you have nausea and vomiting.  Eat as you normally do.  Avoid:  Foods or drinks high in sugar.  Carbonated  drinks.  Juice.  Extremely hot or cold fluids.  Drinks with caffeine.  Fatty, greasy foods.  Alcohol.  Tobacco.  Overeating.  Gelatin desserts.  Wash your hands well to avoid spreading bacteria and viruses.  Only take over-the-counter or prescription medicines for pain, discomfort, or fever as directed by your caregiver.  Ask your caregiver if you should continue all prescribed and over-the-counter medicines.  Keep all follow-up appointments with your caregiver. SEEK MEDICAL CARE IF:  You have abdominal pain and it increases or stays in one area (localizes).  You have a rash, stiff neck, or severe headache.  You are irritable, sleepy, or difficult to awaken.  You are weak, dizzy, or extremely thirsty. SEEK IMMEDIATE MEDICAL CARE IF:   You are unable to keep fluids down or you get worse despite treatment.  You have frequent episodes of vomiting or diarrhea.  You have blood or green matter (bile) in your vomit.  You have blood in your stool or your stool looks black and tarry.  You have not urinated in 6 to 8 hours, or you have only urinated a small amount of very dark urine.  You have a fever.  You faint. MAKE SURE YOU:   Understand these instructions.  Will watch your condition.  Will get help right away if you are not doing well or get worse. Document Released: 10/22/2005 Document Revised: 01/14/2012 Document Reviewed: 06/11/2011 ExitCare Patient Information 2015 [employer]. This information is not intended to replace advice given to you by your health care provider. Make sure you discuss any questions you have with your health care   provider.  

## 2014-10-12 NOTE — Progress Notes (Addendum)
[place]  [phone] [phone]     ID: Melissa Garza DOB: 10/12/1956  MR#: [id number]  IPJ#:[id number]  Patient Care Team: No Pcp Per Patient as PCP - General (General Practice) Gus Height, MD as Consulting Physician (Obstetrics and Gynecology) Excell Seltzer, MD as Consulting Physician (General Surgery) Chauncey Cruel, MD as Consulting Physician (Oncology) Rexene Edison, MD as Consulting Physician (Radiation Oncology) OTHER MD: Chucky May MD  CHIEF COMPLAINT: Triple negative breast cancer  CURRENT TREATMENT: Neoadjuvant chemotherapy   BREAST CANCER HISTORY: Melissa Garza herself palpated a mass in her right breast approximately mid-September. She brought this to her gynecologist attention and he set her up for bilateral diagnostic mammography at the breast center 08/04/2014. The breast density was category C. in the patient has bilateral saline implants in place. In the palpable area of concern in the right breast there was an irregular mass measuring up to 2.5 cm. By palpation this was firm at the 12:30 o'clock position. Ultrasound of the right breast confirmed an irregular hypoechoic mass in this area measuring 2.5 cm. There was no right axillary lymphadenopathy noted.  Biopsy of the mass in question 08/04/2014 showed ([id number]) invasive ductal carcinoma, grade 3, triple negative, with an MIB-1 of 90%.  Bilateral breast MRI 08/09/2014 showed in the upper inner quadrant of the right breast an irregular enhancing mass measuring 2.8 cm. There was a 4 mm satellite nodule anterior to this and separated from that by 0.6 cm. The left breast, the remaining of the right breast and the lymph node areas were negative.  The patient's subsequent history is as detailed below  INTERVAL HISTORY: Melissa Garza returns today for follow up of her breast cancer, accompanied by her husband. Today is day 8, cycle 4 of doxorubicin and cyclophosphamide with  neulasta given on day 2 for granulocyte support.   Melissa Garza says this final cycle has really worn her down. She got a liter of fluid on Friday and felt better for a day or two, but woke up fatigued and weak today. Her appetite is almost completely gone and she cant taste anything. She had constipation initially, followed by a few loose bowel movement, and now she hasn't gone in 48 hours. She is mildly queezy and last took lorazepam last night. She has had no zofran or compazine today. Her blood pressure is low and her HR is up to 140, but she barely feels any flutters or palpations. She denies shortness of breath or chest pain. She has no mouth sores, rashes, or peripheral neuropathy symptoms. She has a history of glaucoma and had her eyes checked this past week. The pressure has increased significantly, and her ophthalmologist suggests she return in 1 to have the pressure rechecked as this may be chemotherapy related.   REVIEW OF SYSTEMS: A detailed review of systems is otherwise negative, except where noted above.   PAST MEDICAL HISTORY: Past Medical History  Diagnosis Date  . Glaucoma   . Anxiety   . Hot flashes   . Depression   . PONV (postoperative nausea and vomiting)   . Headache     chronic  . Shortness of breath     with exertion  . GERD (gastroesophageal reflux disease)     PMH  . Cancer     cancer of right breast  . Hepatitis     Hx: of Hepatitis not sure which one    PAST SURGICAL HISTORY: Past Surgical History  Procedure Laterality Date  .  Tonsilectomy/adenoidectomy with myringotomy    . Breast enhancement surgery  2008  . Eye surgery      Lasik  . Colonoscopy    . Breast surgery      Biopsy  . Dilation and curettage of uterus    . Tubal ligation    . Portacath placement N/A 08/13/2014    Procedure: INSERTION PORT-A-CATH;  Surgeon: Excell Seltzer, MD;  Location: [facility] OR;  Service: General;  Laterality: N/A;    FAMILY HISTORY Family History  Problem Relation Age  of Onset  . Psoriasis Mother   . Hypertension Father   . Asthma Other   . Thyroid disease Other    The patient's parents are currently alive, in their late 34s. The patient had no brothers, 2 sisters. There is no history of breast or ovarian cancer in the family to her knowledge.   GYNECOLOGIC HISTORY:  No LMP recorded. Patient is postmenopausal. Menarche age 37, first live birth age 58. She is GX P2. She stopped having periods in 2005 and started hormone replacement at that time. 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 Her husband, [name], is disabled secondary to multiple cardiac problems. Son [name] this and [name] and works in apartment maintenance. Son Sonia Side me [name] is his wife) works as an Clinical biochemist. The patient has 6 grandchildren. She is not a Ambulance person.   ADVANCED DIRECTIVES: Not in place   HEALTH MAINTENANCE: History  Substance Use Topics  . Smoking status: Never Smoker   . Smokeless tobacco: Never Used  . Alcohol Use: No     Colonoscopy:Remote  PAP:2014  Bone density:Never  Lipid panel:  Allergies  Allergen Reactions  . Hydrocodone Hives and Itching    Current Outpatient Prescriptions  Medication Sig Dispense Refill  . acetaminophen (TYLENOL) 500 MG tablet Take 1,000 mg by mouth every 6 (six) hours as needed for mild pain.    Marland Kitchen ALPRAZolam (XANAX) 1 MG tablet Take 1 mg by mouth 4 (four) times daily as needed for anxiety.    . Armodafinil (NUVIGIL) 250 MG tablet Take 250 mg by mouth once a week.     . butalbital-acetaminophen-caffeine (FIORICET, ESGIC) 50-325-40 MG per tablet Take 1 tablet by mouth 2 (two) times daily as needed for headache.     . dexamethasone (DECADRON) 4 MG tablet Take 2 tablets (8 mg total) by mouth as directed. Take two tablets the day after chemotherapy, then two tablets BID for two days. 30  tablet 3  . famotidine (PEPCID) 20 MG tablet Take 20 mg by mouth as needed.     Marland Kitchen FLUoxetine (PROZAC) 40 MG capsule Take 40 mg by mouth daily.    . fluticasone (CUTIVATE) 0.05 % cream Apply 1 application topically daily as needed (psoriasis).     Marland Kitchen lidocaine-prilocaine (EMLA) cream Apply 1 application topically as needed. Apply to portacath  1 1/2 - 2 hours as needed prior to procedures. 30 g 0  . LORazepam (ATIVAN) 0.5 MG tablet Take 1 tablet (0.5 mg total) by mouth every 8 (eight) hours. 30 tablet 0  . methylphenidate 54 MG PO CR tablet Take 54 mg by mouth every morning.    . nystatin-triamcinolone (MYCOLOG II) cream Apply 1 application topically daily as needed.     . ondansetron (ZOFRAN) 8 MG tablet Take 1 tablet (8 mg total) by mouth  every 8 (eight) hours as needed for nausea or vomiting. Start the third day after chemo 20 tablet 0  . Polyvinyl Alcohol-Povidone (REFRESH OP) Place 2 drops into both eyes daily as needed (ALLERGIES).    Marland Kitchen prochlorperazine (COMPAZINE) 10 MG tablet Take 1 tablet (10 mg total) by mouth every 6 (six) hours as needed for nausea or vomiting. 30 tablet 0  . travoprost, benzalkonium, (TRAVATAN) 0.004 % ophthalmic solution Place 1 drop into both eyes at bedtime.     . traZODone (DESYREL) 50 MG tablet Take 50 mg by mouth at bedtime as needed for sleep.    Marland Kitchen zolpidem (AMBIEN) 10 MG tablet Take 10 mg by mouth at bedtime as needed for sleep.    . ciprofloxacin (CIPRO) 500 MG tablet Take 1 tablet (500 mg total) by mouth 2 (two) times daily. 10 tablet 0  . CLOBETASOL PROPIONATE EX Apply 1 application topically daily as needed (DRY SCALP).    . clotrimazole-betamethasone (LOTRISONE) cream Apply 1 application topically 2 (two) times daily. (Patient not taking: Reported on 10/12/2014) 30 g 0   No current facility-administered medications for this visit.   Facility-Administered Medications Ordered in Other Visits  Medication Dose Route Frequency Provider Last Rate Last Dose  .  heparin lock flush 100 unit/mL  500 Units Intracatheter Once PRN Minette Headland, NP      . sodium chloride 0.9 % injection 10 mL  10 mL Intracatheter PRN Minette Headland, NP        OBJECTIVE:  Filed Vitals:   10/12/14 1322  BP: 103/66  Pulse: 140  Temp: 98.3 F (36.8 C)  Resp: 21     Body mass index is 28.26 kg/(m^2).    ECOG FS:1 - Symptomatic but completely ambulatory GENERAL: Patient is a well appearing female in no acute distress  Sclerae unicteric, pupils equal and reactive Oropharynx clear and moist-- no thrush No cervical or supraclavicular adenopathy Lungs no rales or rhonchi Heart tachycardic, but regular rhythm, no murmurs, gallops, or rubs Abd soft, nontender, positive bowel sounds MSK no focal spinal tenderness, no upper extremity lymphedema Neuro: nonfocal, well oriented, appropriate affect Breasts: deferred  LAB RESULTS:  CMP     Component Value Date/Time   NA 138 10/12/2014 1245   NA 143 06/17/2009 1137   K 4.4 10/12/2014 1245   K 4.7 06/17/2009 1137   CL 114* 06/17/2009 1137   CO2 24 10/12/2014 1245   CO2 24 06/17/2009 1137   GLUCOSE 107 10/12/2014 1245   GLUCOSE 97 06/17/2009 1137   GLUCOSE 81 10/22/2006 1253   BUN 14.2 10/12/2014 1245   BUN 16 06/17/2009 1137   CREATININE 0.9 10/12/2014 1245   CREATININE 1.2 06/17/2009 1137   CALCIUM 9.8 10/12/2014 1245   CALCIUM 9.4 06/17/2009 1137   PROT 6.6 10/12/2014 1245   PROT 7.0 06/17/2009 1137   ALBUMIN 3.6 10/12/2014 1245   ALBUMIN 3.9 06/17/2009 1137   AST 10 10/12/2014 1245   AST 15 06/17/2009 1137   ALT 13 10/12/2014 1245   ALT 13 06/17/2009 1137   ALKPHOS 80 10/12/2014 1245   ALKPHOS 55 06/17/2009 1137   BILITOT 0.61 10/12/2014 1245   BILITOT 0.7 06/17/2009 1137   GFRNONAA 49.82 06/17/2009 1137    I No results found for: SPEP  Lab Results  Component Value Date   WBC 0.4* 10/12/2014   NEUTROABS 0.0* 10/12/2014   HGB 10.9* 10/12/2014   HCT 33.0* 10/12/2014   MCV 94.7 10/12/2014    PLT  81* 10/12/2014      Chemistry      Component Value Date/Time   NA 138 10/12/2014 1245   NA 143 06/17/2009 1137   K 4.4 10/12/2014 1245   K 4.7 06/17/2009 1137   CL 114* 06/17/2009 1137   CO2 24 10/12/2014 1245   CO2 24 06/17/2009 1137   BUN 14.2 10/12/2014 1245   BUN 16 06/17/2009 1137   CREATININE 0.9 10/12/2014 1245   CREATININE 1.2 06/17/2009 1137      Component Value Date/Time   CALCIUM 9.8 10/12/2014 1245   CALCIUM 9.4 06/17/2009 1137   ALKPHOS 80 10/12/2014 1245   ALKPHOS 55 06/17/2009 1137   AST 10 10/12/2014 1245   AST 15 06/17/2009 1137   ALT 13 10/12/2014 1245   ALT 13 06/17/2009 1137   BILITOT 0.61 10/12/2014 1245   BILITOT 0.7 06/17/2009 1137      No results found for: LABCA2  No components found for: LABCA125  No results for input(s): INR in the last 168 hours.  Urinalysis No results found for: COLORURINE  STUDIES: Mr Breast Bilateral W Wo Contrast  10/08/2014   CLINICAL DATA:  Personal history of right breast cancer assess for response to neoadjuvant therapy.  LABS:  Does not apply  EXAM: BILATERAL BREAST MRI WITH AND WITHOUT CONTRAST  TECHNIQUE: Multiplanar, multisequence MR images of both breasts were obtained prior to and following the intravenous administration of 21ml of MultiHance.  THREE-DIMENSIONAL MR IMAGE RENDERING ON INDEPENDENT WORKSTATION:  Three-dimensional MR images were rendered by post-processing of the original MR data on an independent workstation. The three-dimensional MR images were interpreted, and findings are reported in the following complete MRI report for this study. Three dimensional images were evaluated at the independent DynaCad workstation  COMPARISON:  Previous exams including MRI of breast August 09, 2014  FINDINGS: Breast composition: c:  Heterogeneous fibroglandular tissue  Background parenchymal enhancement: Mild  Right breast: The previously noted enhancing mass with central biopsy clip in the medial upper right  breast is significantly decreased in size currently measuring 8 x 15 x 13 mm. No other foci of abnormal enhancement is identified.  Left breast: No mass or abnormal enhancement.  Lymph nodes: No abnormal appearing lymph nodes.  Ancillary findings:  None.  IMPRESSION: Right breast cancer is significantly smaller on the current exam. No other foci of abnormal enhancement is identified within the right breast.  RECOMMENDATION: Treatment plan.  BI-RADS CATEGORY  6: Known biopsy-proven malignancy.   Electronically Signed   By: Abelardo Diesel M.D.   On: 10/08/2014 14:40    ASSESSMENT: 58 y.o. [place] woman status post right breast upper inner quadrant biopsy 08/04/2014 for a clinical T2 N0, stage IIA invasive ductal carcinoma, grade 3, triple negative, with an MIB-1 of 90%.  1. Patient is undergoing neoadjuvant chemotherapy with doxorubicin and cyclophosphamide dose dense x4 with Neulasta given on day 2 for granulocyte support. Completed on 10/05/14.  To be followed by paclitaxel weekly x12.  2. Surgery to follow chemo 3. Radiation to follow surgery  PLAN: Camron is weak today. I am sending her back to the treatment room today for 1L NS. The labs were reviewed in detail and her [place] is down to 0.0. She was given a mask to wear and we reviewed neutropenic precautions including good hand hygiene, avoiding sick contacts or crowds, and notifying us of any temperatures greater than 100.5. I sent a prescription for 500mg  cipro BID for prophylactic antibiotic treatment.   I encouraged  Mitzie to begin a protein supplement such as boost or ensure to keep up with an appropriate calorie intake, and of course to take in as much fluid as possible.   Treasure and I discussed her midpoint breast MRI results. The cancer in her right breast is significantly smaller than when first assessed and there was no abnormal enhancement identified in the lymph nodes. We also discussed her next chemo regimen which is paclitaxel weekly x 12  as well as side effects and potential toxicities. She knows to watch for peripheral neuropathy symptoms and hypersensitivity reactions. After her first dose of paclitaxel we will likely reduce the dose of her IV dexamethasone premed and omit the oral doses altogether.   Penda will return next week for her first dose of paclitaxel and a office visit with Dr. Jana Hakim. She understands and agrees with this plan. She knows the goal of treatment in her case is cure. She has been encouraged to call with any issues that might arise before her next visit here.   Marcelino Duster, [place] [phone] 10/12/2014 5:10 PM   ADDENDUM: After 1 liter of NS, blood pressure improved and HR down to 120. Verbal order given to continue IV fluids to an additional 500cc-1L NS, whatever there is time for. Patient does not have to complete the 2nd liter.

## 2014-10-12 NOTE — Progress Notes (Signed)
Per Nira Conn NP, give fluids at a rate 750, give as much as tolerated until closing

## 2014-10-19 ENCOUNTER — Ambulatory Visit (HOSPITAL_BASED_OUTPATIENT_CLINIC_OR_DEPARTMENT_OTHER): Payer: BC Managed Care – PPO | Admitting: Oncology

## 2014-10-19 ENCOUNTER — Ambulatory Visit (HOSPITAL_BASED_OUTPATIENT_CLINIC_OR_DEPARTMENT_OTHER): Payer: BC Managed Care – PPO

## 2014-10-19 ENCOUNTER — Other Ambulatory Visit (HOSPITAL_BASED_OUTPATIENT_CLINIC_OR_DEPARTMENT_OTHER): Payer: BC Managed Care – PPO

## 2014-10-19 VITALS — BP 102/65 | HR 96 | Temp 98.4°F | Resp 18 | Ht 60.0 in | Wt 148.1 lb

## 2014-10-19 DIAGNOSIS — Z171 Estrogen receptor negative status [ER-]: Secondary | ICD-10-CM

## 2014-10-19 DIAGNOSIS — C50211 Malignant neoplasm of upper-inner quadrant of right female breast: Secondary | ICD-10-CM

## 2014-10-19 DIAGNOSIS — Z5111 Encounter for antineoplastic chemotherapy: Secondary | ICD-10-CM

## 2014-10-19 LAB — CBC WITH DIFFERENTIAL/PLATELET
BASO%: 0.4 % (ref 0.0–2.0)
Basophils Absolute: 0 10*3/uL (ref 0.0–0.1)
EOS%: 0.1 % (ref 0.0–7.0)
Eosinophils Absolute: 0 10*3/uL (ref 0.0–0.5)
HEMATOCRIT: 28.3 % — AB (ref 34.8–46.6)
HGB: 9.3 g/dL — ABNORMAL LOW (ref 11.6–15.9)
LYMPH%: 9.6 % — AB (ref 14.0–49.7)
MCH: 32.1 pg (ref 25.1–34.0)
MCHC: 32.9 g/dL (ref 31.5–36.0)
MCV: 97.6 fL (ref 79.5–101.0)
MONO#: 0.8 10*3/uL (ref 0.1–0.9)
MONO%: 12.9 % (ref 0.0–14.0)
NEUT#: 4.7 10*3/uL (ref 1.5–6.5)
NEUT%: 77 % — ABNORMAL HIGH (ref 38.4–76.8)
PLATELETS: 151 10*3/uL (ref 145–400)
RBC: 2.9 10*6/uL — AB (ref 3.70–5.45)
RDW: 15.2 % — ABNORMAL HIGH (ref 11.2–14.5)
WBC: 6 10*3/uL (ref 3.9–10.3)
lymph#: 0.6 10*3/uL — ABNORMAL LOW (ref 0.9–3.3)

## 2014-10-19 LAB — COMPREHENSIVE METABOLIC PANEL (CC13)
ALT: 22 U/L (ref 0–55)
AST: 16 U/L (ref 5–34)
Albumin: 3.2 g/dL — ABNORMAL LOW (ref 3.5–5.0)
Alkaline Phosphatase: 81 U/L (ref 40–150)
Anion Gap: 9 mEq/L (ref 3–11)
BUN: 9.9 mg/dL (ref 7.0–26.0)
CO2: 25 mEq/L (ref 22–29)
Calcium: 9 mg/dL (ref 8.4–10.4)
Chloride: 107 mEq/L (ref 98–109)
Creatinine: 0.8 mg/dL (ref 0.6–1.1)
EGFR: 82 mL/min/{1.73_m2} — ABNORMAL LOW (ref >90)
Glucose: 81 mg/dl (ref 70–140)
Potassium: 3.9 mEq/L (ref 3.5–5.1)
Sodium: 141 mEq/L (ref 136–145)
Total Bilirubin: 0.23 mg/dL (ref 0.20–1.20)
Total Protein: 5.9 g/dL — ABNORMAL LOW (ref 6.4–8.3)

## 2014-10-19 MED ORDER — DEXAMETHASONE SODIUM PHOSPHATE 20 MG/5ML IJ SOLN
20.0000 mg | Freq: Once | INTRAMUSCULAR | Status: AC
  Administered 2014-10-19: 20 mg via INTRAVENOUS

## 2014-10-19 MED ORDER — ONDANSETRON 8 MG/50ML IVPB (CHCC)
8.0000 mg | Freq: Once | INTRAVENOUS | Status: AC
  Administered 2014-10-19: 8 mg via INTRAVENOUS

## 2014-10-19 MED ORDER — FAMOTIDINE IN NACL 20-0.9 MG/50ML-% IV SOLN
20.0000 mg | Freq: Once | INTRAVENOUS | Status: AC
  Administered 2014-10-19: 20 mg via INTRAVENOUS

## 2014-10-19 MED ORDER — DIPHENHYDRAMINE HCL 50 MG/ML IJ SOLN
INTRAMUSCULAR | Status: AC
  Filled 2014-10-19: qty 1

## 2014-10-19 MED ORDER — SODIUM CHLORIDE 0.9 % IJ SOLN
10.0000 mL | INTRAMUSCULAR | Status: DC | PRN
  Administered 2014-10-19: 10 mL
  Filled 2014-10-19: qty 10

## 2014-10-19 MED ORDER — HEPARIN SOD (PORK) LOCK FLUSH 100 UNIT/ML IV SOLN
500.0000 [IU] | Freq: Once | INTRAVENOUS | Status: AC | PRN
  Administered 2014-10-19: 500 [IU]
  Filled 2014-10-19: qty 5

## 2014-10-19 MED ORDER — SODIUM CHLORIDE 0.9 % IV SOLN
Freq: Once | INTRAVENOUS | Status: AC
  Administered 2014-10-19: 11:00:00 via INTRAVENOUS

## 2014-10-19 MED ORDER — ONDANSETRON HCL 8 MG PO TABS
8.0000 mg | ORAL_TABLET | Freq: Two times a day (BID) | ORAL | Status: DC
Start: 2014-10-19 — End: 2015-01-13

## 2014-10-19 MED ORDER — DIPHENHYDRAMINE HCL 50 MG/ML IJ SOLN
25.0000 mg | Freq: Once | INTRAMUSCULAR | Status: AC
  Administered 2014-10-19: 25 mg via INTRAVENOUS

## 2014-10-19 MED ORDER — FAMOTIDINE IN NACL 20-0.9 MG/50ML-% IV SOLN
INTRAVENOUS | Status: AC
  Filled 2014-10-19: qty 50

## 2014-10-19 MED ORDER — PACLITAXEL CHEMO INJECTION 300 MG/50ML
80.0000 mg/m2 | Freq: Once | INTRAVENOUS | Status: AC
  Administered 2014-10-19: 132 mg via INTRAVENOUS
  Filled 2014-10-19: qty 22

## 2014-10-19 MED ORDER — ONDANSETRON 8 MG/NS 50 ML IVPB
INTRAVENOUS | Status: AC
  Filled 2014-10-19: qty 8

## 2014-10-19 MED ORDER — DEXAMETHASONE SODIUM PHOSPHATE 20 MG/5ML IJ SOLN
INTRAMUSCULAR | Status: AC
  Filled 2014-10-19: qty 5

## 2014-10-19 MED ORDER — PROCHLORPERAZINE MALEATE 10 MG PO TABS
10.0000 mg | ORAL_TABLET | Freq: Four times a day (QID) | ORAL | Status: DC | PRN
Start: 2014-10-19 — End: 2015-01-13

## 2014-10-19 NOTE — Progress Notes (Signed)
[facility]  [phone] [phone]) [phone]     ID: Melissa Garza DOB: August 16, 1956  MR#: [id number]  MGQ#:[id number]  Patient Care Team: No Pcp Per Patient as PCP - General (General Practice) Gus Height, MD as Consulting Physician (Obstetrics and Gynecology) Excell Seltzer, MD as Consulting Physician (General Surgery) Chauncey Cruel, MD as Consulting Physician (Oncology) Rexene Edison, MD as Consulting Physician (Radiation Oncology) OTHER MD: Chucky May MD  CHIEF COMPLAINT: Triple negative breast cancer  CURRENT TREATMENT: Neoadjuvant chemotherapy   BREAST CANCER HISTORY: From the original intake note:  Melissa Garza herself palpated a mass in her right breast approximately mid-September. She brought this to her gynecologist attention and he set her up for bilateral diagnostic mammography at the breast center 08/04/2014. The breast density was category C. in the patient has bilateral saline implants in place. In the palpable area of concern in the right breast there was an irregular mass measuring up to 2.5 cm. By palpation this was firm at the 12:30 o'clock position. Ultrasound of the right breast confirmed an irregular hypoechoic mass in this area measuring 2.5 cm. There was no right axillary lymphadenopathy noted.  Biopsy of the mass in question 08/04/2014 showed ([id number]) invasive ductal carcinoma, grade 3, triple negative, with an MIB-1 of 90%.  Bilateral breast MRI 08/09/2014 showed in the upper inner quadrant of the right breast an irregular enhancing mass measuring 2.8 cm. There was a 4 mm satellite nodule anterior to this and separated from that by 0.6 cm. The left breast, the remaining of the right breast and the lymph node areas were negative.  The patient's subsequent history is as detailed below  INTERVAL HISTORY: Melissa Garza returns today for follow up of her breast cancer, accompanied by her husband Melissa Garza. Today is day 1, cycle 1 of 12  planned cycles of weekly paclitaxel.  REVIEW OF SYSTEMS: Melissa Garza had a hard time the last 2 or 3 weeks. The third and fourth cycle of doxorubicin and cyclophosphamide were hard on her. She was weak, fatigued, stressed, not eating or drinking much, and required fluid support on more than one occasion. For the last 3 or 4 days however she has felt better. She has more energy. She is eating and drinking normally. She has had some problem swallowing at times, no mouth sores but just a feeling that things might not go down, although they actually end up doing that. She said tenderness in the 2 large toes, not in the rest of the foot. Her glaucoma is a little bit worse. This is being closely followed. She feels short of breath sometimes. This happens more when she is walking up stairs, though she gets up to the top of the stairs without stopping. She has chronic back pain per she has nocturia 3 or 4 times a night, which is not a new problem. She feels forgetful, anxious and depressed. She has hot flashes frequently. A detailed review of systems was otherwise stable  PAST MEDICAL HISTORY: Past Medical History  Diagnosis Date  . Glaucoma   . Anxiety   . Hot flashes   . Depression   . PONV (postoperative nausea and vomiting)   . Headache     chronic  . Shortness of breath     with exertion  . GERD (gastroesophageal reflux disease)     PMH  . Cancer     cancer of right breast  . Hepatitis     Hx: of Hepatitis not sure which one  PAST SURGICAL HISTORY: Past Surgical History  Procedure Laterality Date  . Tonsilectomy/adenoidectomy with myringotomy    . Breast enhancement surgery  2008  . Eye surgery      Lasik  . Colonoscopy    . Breast surgery      Biopsy  . Dilation and curettage of uterus    . Tubal ligation    . Portacath placement N/A 08/13/2014    Procedure: INSERTION PORT-A-CATH;  Surgeon: Excell Seltzer, MD;  Location: [facility] OR;  Service: General;  Laterality: N/A;    FAMILY  HISTORY Family History  Problem Relation Age of Onset  . Psoriasis Mother   . Hypertension Father   . Asthma Other   . Thyroid disease Other    The patient's parents are currently alive, in their late 51s. The patient had no brothers, 2 sisters. There is no history of breast or ovarian cancer in the family to her knowledge.   GYNECOLOGIC HISTORY:  No LMP recorded. Patient is postmenopausal. Menarche age 23, first live birth age 32. She is GX P2. She stopped having periods in 2005 and started hormone replacement at that time. 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 Her husband, Melissa Garza, is disabled secondary to multiple cardiac problems. Son Melissa Garza lives in [place] and works in apartment maintenance. Son Melissa Garza is his wife) works as an Clinical biochemist. The patient has 6 grandchildren. She is not a Ambulance person.   ADVANCED DIRECTIVES: Not in place   HEALTH MAINTENANCE: History  Substance Use Topics  . Smoking status: Never Smoker   . Smokeless tobacco: Never Used  . Alcohol Use: No     Colonoscopy:Remote  PAP:2014  Bone density:Never  Lipid panel:  Allergies  Allergen Reactions  . Hydrocodone Hives and Itching    Current Outpatient Prescriptions  Medication Sig Dispense Refill  . acetaminophen (TYLENOL) 500 MG tablet Take 1,000 mg by mouth every 6 (six) hours as needed for mild pain.    Marland Kitchen ALPRAZolam (XANAX) 1 MG tablet Take 1 mg by mouth 4 (four) times daily as needed for anxiety.    . Armodafinil (NUVIGIL) 250 MG tablet Take 250 mg by mouth once a week.     . butalbital-acetaminophen-caffeine (FIORICET, ESGIC) 50-325-40 MG per tablet Take 1 tablet by mouth 2 (two) times daily as needed for headache.     . ciprofloxacin (CIPRO) 500 MG tablet Take 1 tablet (500 mg total) by mouth 2 (two) times daily. 10 tablet 0  . CLOBETASOL  PROPIONATE EX Apply 1 application topically daily as needed (DRY SCALP).    . clotrimazole-betamethasone (LOTRISONE) cream Apply 1 application topically 2 (two) times daily. (Patient not taking: Reported on 10/12/2014) 30 g 0  . dexamethasone (DECADRON) 4 MG tablet Take 2 tablets (8 mg total) by mouth as directed. Take two tablets the day after chemotherapy, then two tablets BID for two days. 30 tablet 3  . famotidine (PEPCID) 20 MG tablet Take 20 mg by mouth as needed.     Marland Kitchen FLUoxetine (PROZAC) 40 MG capsule Take 40 mg by mouth daily.    . fluticasone (CUTIVATE) 0.05 % cream Apply 1 application topically daily as needed (psoriasis).     Marland Kitchen lidocaine-prilocaine (EMLA) cream Apply 1 application topically as needed. Apply to portacath  1 1/2 - 2 hours as needed prior to procedures. 30 g  0  . LORazepam (ATIVAN) 0.5 MG tablet Take 1 tablet (0.5 mg total) by mouth every 8 (eight) hours. 30 tablet 0  . methylphenidate 54 MG PO CR tablet Take 54 mg by mouth every morning.    . nystatin-triamcinolone (MYCOLOG II) cream Apply 1 application topically daily as needed.     . ondansetron (ZOFRAN) 8 MG tablet Take 1 tablet (8 mg total) by mouth every 8 (eight) hours as needed for nausea or vomiting. Start the third day after chemo 20 tablet 0  . Polyvinyl Alcohol-Povidone (REFRESH OP) Place 2 drops into both eyes daily as needed (ALLERGIES).    Marland Kitchen prochlorperazine (COMPAZINE) 10 MG tablet Take 1 tablet (10 mg total) by mouth every 6 (six) hours as needed for nausea or vomiting. 30 tablet 0  . travoprost, benzalkonium, (TRAVATAN) 0.004 % ophthalmic solution Place 1 drop into both eyes at bedtime.     . traZODone (DESYREL) 50 MG tablet Take 50 mg by mouth at bedtime as needed for sleep.    Marland Kitchen zolpidem (AMBIEN) 10 MG tablet Take 10 mg by mouth at bedtime as needed for sleep.     No current facility-administered medications for this visit.    OBJECTIVE:  Middle-aged white woman who appears stated age 58 Vitals:    10/19/14 1001  BP: 102/65  Pulse: 96  Temp: 98.4 F (36.9 C)  Resp: 18     Body mass index is 28.92 kg/(m^2).    ECOG FS:1 - Symptomatic but completely ambulatory   Sclerae unicteric, pupils round and equal Oropharynx clear and moist-- no thrush or other lesions noted No cervical or supraclavicular adenopathy Lungs no rales or rhonchi Heart regular rate and rhythm Abd soft, nontender, positive bowel sounds MSK no focal spinal tenderness, no upper extremity lymphedema Neuro: nonfocal, well oriented, appropriate affect Breasts: I do not palpate a mass in the right breast. There are no skin or nipple changes of concern. The right axilla is benign. The left breast is unremarkable.   LAB RESULTS:  CMP     Component Value Date/Time   NA 138 10/12/2014 1245   NA 143 06/17/2009 1137   K 4.4 10/12/2014 1245   K 4.7 06/17/2009 1137   CL 114* 06/17/2009 1137   CO2 24 10/12/2014 1245   CO2 24 06/17/2009 1137   GLUCOSE 107 10/12/2014 1245   GLUCOSE 97 06/17/2009 1137   GLUCOSE 81 10/22/2006 1253   BUN 14.2 10/12/2014 1245   BUN 16 06/17/2009 1137   CREATININE 0.9 10/12/2014 1245   CREATININE 1.2 06/17/2009 1137   CALCIUM 9.8 10/12/2014 1245   CALCIUM 9.4 06/17/2009 1137   PROT 6.6 10/12/2014 1245   PROT 7.0 06/17/2009 1137   ALBUMIN 3.6 10/12/2014 1245   ALBUMIN 3.9 06/17/2009 1137   AST 10 10/12/2014 1245   AST 15 06/17/2009 1137   ALT 13 10/12/2014 1245   ALT 13 06/17/2009 1137   ALKPHOS 80 10/12/2014 1245   ALKPHOS 55 06/17/2009 1137   BILITOT 0.61 10/12/2014 1245   BILITOT 0.7 06/17/2009 1137   GFRNONAA 49.82 06/17/2009 1137    I No results found for: SPEP  Lab Results  Component Value Date   WBC 6.0 10/19/2014   NEUTROABS 4.7 10/19/2014   HGB 9.3* 10/19/2014   HCT 28.3* 10/19/2014   MCV 97.6 10/19/2014   PLT 151 10/19/2014      Chemistry      Component Value Date/Time   NA 138 10/12/2014 1245   NA 143  06/17/2009 1137   K 4.4 10/12/2014 1245   K 4.7  06/17/2009 1137   CL 114* 06/17/2009 1137   CO2 24 10/12/2014 1245   CO2 24 06/17/2009 1137   BUN 14.2 10/12/2014 1245   BUN 16 06/17/2009 1137   CREATININE 0.9 10/12/2014 1245   CREATININE 1.2 06/17/2009 1137      Component Value Date/Time   CALCIUM 9.8 10/12/2014 1245   CALCIUM 9.4 06/17/2009 1137   ALKPHOS 80 10/12/2014 1245   ALKPHOS 55 06/17/2009 1137   AST 10 10/12/2014 1245   AST 15 06/17/2009 1137   ALT 13 10/12/2014 1245   ALT 13 06/17/2009 1137   BILITOT 0.61 10/12/2014 1245   BILITOT 0.7 06/17/2009 1137      No results found for: LABCA2  No components found for: LABCA125  No results for input(s): INR in the last 168 hours.  Urinalysis No results found for: COLORURINE  STUDIES: Mr Breast Bilateral W Wo Contrast  10/08/2014   CLINICAL DATA:  Personal history of right breast cancer assess for response to neoadjuvant therapy.  LABS:  Does not apply  EXAM: BILATERAL BREAST MRI WITH AND WITHOUT CONTRAST  TECHNIQUE: Multiplanar, multisequence MR images of both breasts were obtained prior to and following the intravenous administration of 55ml of MultiHance.  THREE-DIMENSIONAL MR IMAGE RENDERING ON INDEPENDENT WORKSTATION:  Three-dimensional MR images were rendered by post-processing of the original MR data on an independent workstation. The three-dimensional MR images were interpreted, and findings are reported in the following complete MRI report for this study. Three dimensional images were evaluated at the independent DynaCad workstation  COMPARISON:  Previous exams including MRI of breast August 09, 2014  FINDINGS: Breast composition: c:  Heterogeneous fibroglandular tissue  Background parenchymal enhancement: Mild  Right breast: The previously noted enhancing mass with central biopsy clip in the medial upper right breast is significantly decreased in size currently measuring 8 x 15 x 13 mm. No other foci of abnormal enhancement is identified.  Left breast: No mass or  abnormal enhancement.  Lymph nodes: No abnormal appearing lymph nodes.  Ancillary findings:  None.  IMPRESSION: Right breast cancer is significantly smaller on the current exam. No other foci of abnormal enhancement is identified within the right breast.  RECOMMENDATION: Treatment plan.  BI-RADS CATEGORY  6: Known biopsy-proven malignancy.   Electronically Signed   By: Abelardo Diesel M.D.   On: 10/08/2014 14:40    ASSESSMENT: 58 y.o. [place] woman status post right breast upper inner quadrant biopsy 08/04/2014 for a clinical T2 N0, stage IIA invasive ductal carcinoma, grade 3, triple negative, with an MIB-1 of 90%.  1. neoadjuvant chemotherapy with doxorubicin and cyclophosphamide dose dense x4 completed on 10/05/14; followed by paclitaxel weekly x12 started 10/19/2014  2. Surgery to follow chemotherapy  3. Radiation to follow surgery  PLAN: Nimrat is recovering from her initial chemotherapy and is now ready to start her weekly paclitaxel. We discussed the possible toxicities, side effects and complications of this agent. She understands she will not need to take dexamethasone orally for nausea control. Usually we use Zofran alone or in some cases Zofran plus prochlorperazine (both prescriptions are in place). I think the dexamethasone may be what has been causing her glaucoma to become a bit worse. They should follow that closely but my guess is within the next few weeks it will go back to baseline.  I don't know why her large big toenails are irritated. She her shoes seem to fit appropriately.  She doesn't have any other problems with any other nails. Certainly nails can be injured by the paclitaxel and it wouldn't surprise me if she ended up losing the large toenails bilaterally, although of course that does not have to happen. If it does she was reassured that she will get completely normal nails after 6-8 months.  I showed her the pictures of her MRI, current and prior, and she was very  encouraged.  Melissa Garza will see [place] on a weekly basis until she completes her paclitaxel treatments. She understands the overall plan and agrees with it. She knows the goal of treatment in her case is cure. She will call with any problems that may develop before the next visit here.    Chauncey Cruel, [place] [phone] 10/19/2014 10:04 AM

## 2014-10-19 NOTE — Patient Instructions (Signed)
Cancer Center Discharge Instructions for Patients Receiving Chemotherapy  Today you received the following chemotherapy agents taxol  To help prevent nausea and vomiting after your treatment, we encourage you to take your nausea medication as directed   If you develop nausea and vomiting that is not controlled by your nausea medication, call the clinic.   BELOW ARE SYMPTOMS THAT SHOULD BE REPORTED IMMEDIATELY:  *FEVER GREATER THAN 100.5 F  *CHILLS WITH OR WITHOUT FEVER  NAUSEA AND VOMITING THAT IS NOT CONTROLLED WITH YOUR NAUSEA MEDICATION  *UNUSUAL SHORTNESS OF BREATH  *UNUSUAL BRUISING OR BLEEDING  TENDERNESS IN MOUTH AND THROAT WITH OR WITHOUT PRESENCE OF ULCERS  *URINARY PROBLEMS  *BOWEL PROBLEMS  UNUSUAL RASH Items with * indicate a potential emergency and should be followed up as soon as possible.  Feel free to call the clinic you have any questions or concerns. The clinic phone number is [phone].  Paclitaxel injection What is this medicine? PACLITAXEL (PAK li TAX el) is a chemotherapy drug. It targets fast dividing cells, like cancer cells, and causes these cells to die. This medicine is used to treat ovarian cancer, breast cancer, and other cancers. This medicine may be used for other purposes; ask your health care provider or pharmacist if you have questions. COMMON BRAND NAME(S): Onxol, Taxol What should I tell my health care provider before I take this medicine? They need to know if you have any of these conditions: -blood disorders -irregular heartbeat -infection (especially a virus infection such as chickenpox, cold sores, or herpes) -liver disease -previous or ongoing radiation therapy -an unusual or allergic reaction to paclitaxel, alcohol, polyoxyethylated castor oil, other chemotherapy agents, other medicines, foods, dyes, or preservatives -pregnant or trying to get pregnant -breast-feeding How should I use this medicine? This drug  is given as an infusion into a vein. It is administered in a hospital or clinic by a specially trained health care professional. Talk to your pediatrician regarding the use of this medicine in children. Special care may be needed. Overdosage: If you think you have taken too much of this medicine contact a poison control center or emergency room at once. NOTE: This medicine is only for you. Do not share this medicine with others. What if I miss a dose? It is important not to miss your dose. Call your doctor or health care professional if you are unable to keep an appointment. What may interact with this medicine? Do not take this medicine with any of the following medications: -disulfiram -metronidazole This medicine may also interact with the following medications: -cyclosporine -diazepam -ketoconazole -medicines to increase blood counts like filgrastim, pegfilgrastim, sargramostim -other chemotherapy drugs like cisplatin, doxorubicin, epirubicin, etoposide, teniposide, vincristine -quinidine -testosterone -vaccines -verapamil Talk to your doctor or health care professional before taking any of these medicines: -acetaminophen -aspirin -ibuprofen -ketoprofen -naproxen This list may not describe all possible interactions. Give your health care provider a list of all the medicines, herbs, non-prescription drugs, or dietary supplements you use. Also tell them if you smoke, drink alcohol, or use illegal drugs. Some items may interact with your medicine. What should I watch for while using this medicine? Your condition will be monitored carefully while you are receiving this medicine. You will need important blood work done while you are taking this medicine. This drug may make you feel generally unwell. This is not uncommon, as chemotherapy can affect healthy cells as well as cancer cells. Report any side effects. Continue your course of treatment even   though you feel ill unless your doctor  tells you to stop. In some cases, you may be given additional medicines to help with side effects. Follow all directions for their use. Call your doctor or health care professional for advice if you get a fever, chills or sore throat, or other symptoms of a cold or flu. Do not treat yourself. This drug decreases your body's ability to fight infections. Try to avoid being around people who are sick. This medicine may increase your risk to bruise or bleed. Call your doctor or health care professional if you notice any unusual bleeding. Be careful brushing and flossing your teeth or using a toothpick because you may get an infection or bleed more easily. If you have any dental work done, tell your dentist you are receiving this medicine. Avoid taking products that contain aspirin, acetaminophen, ibuprofen, naproxen, or ketoprofen unless instructed by your doctor. These medicines may hide a fever. Do not become pregnant while taking this medicine. Women should inform their doctor if they wish to become pregnant or think they might be pregnant. There is a potential for serious side effects to an unborn child. Talk to your health care professional or pharmacist for more information. Do not breast-feed an infant while taking this medicine. Men are advised not to father a child while receiving this medicine. What side effects may I notice from receiving this medicine? Side effects that you should report to your doctor or health care professional as soon as possible: -allergic reactions like skin rash, itching or hives, swelling of the face, lips, or tongue -low blood counts - This drug may decrease the number of white blood cells, red blood cells and platelets. You may be at increased risk for infections and bleeding. -signs of infection - fever or chills, cough, sore throat, pain or difficulty passing urine -signs of decreased platelets or bleeding - bruising, pinpoint red spots on the skin, black, tarry  stools, nosebleeds -signs of decreased red blood cells - unusually weak or tired, fainting spells, lightheadedness -breathing problems -chest pain -high or low blood pressure -mouth sores -nausea and vomiting -pain, swelling, redness or irritation at the injection site -pain, tingling, numbness in the hands or feet -slow or irregular heartbeat -swelling of the ankle, feet, hands Side effects that usually do not require medical attention (report to your doctor or health care professional if they continue or are bothersome): -bone pain -complete hair loss including hair on your head, underarms, pubic hair, eyebrows, and eyelashes -changes in the color of fingernails -diarrhea -loosening of the fingernails -loss of appetite -muscle or joint pain -red flush to skin -sweating This list may not describe all possible side effects. Call your doctor for medical advice about side effects. You may report side effects to FDA at 1-800-FDA-1088. Where should I keep my medicine? This drug is given in a hospital or clinic and will not be stored at home. NOTE: This sheet is a summary. It may not cover all possible information. If you have questions about this medicine, talk to your doctor, pharmacist, or health care provider.  2015, [employer]. (2012-12-15 16:41:21)   

## 2014-10-20 ENCOUNTER — Telehealth: Payer: Self-pay | Admitting: *Deleted

## 2014-10-20 NOTE — Telephone Encounter (Signed)
-----   Message from Arty Baumgartner, RN sent at 10/19/2014 11:04 AM EST ----- Regarding: First time chemo First time taxol.  Dr Jana Hakim.  [phone]

## 2014-10-20 NOTE — Telephone Encounter (Signed)
Called Melissa Garza for chemotherapy F/U.  Patient is doing well.  Denies n/v.  Denies any new side effects or symptoms.  Bowel and bladder is functioning well.  Eating and drinking well and I instructed to drink 64 oz minimum daily or at least the day before, of and after treatment.  Denies questions at this time and encouraged to call if needed.  Reviewed how to call after hours in the case of an emergency.

## 2014-10-26 ENCOUNTER — Ambulatory Visit (HOSPITAL_BASED_OUTPATIENT_CLINIC_OR_DEPARTMENT_OTHER): Payer: BC Managed Care – PPO | Admitting: Nurse Practitioner

## 2014-10-26 ENCOUNTER — Encounter: Payer: Self-pay | Admitting: Oncology

## 2014-10-26 ENCOUNTER — Ambulatory Visit (HOSPITAL_BASED_OUTPATIENT_CLINIC_OR_DEPARTMENT_OTHER): Payer: BC Managed Care – PPO

## 2014-10-26 ENCOUNTER — Ambulatory Visit (HOSPITAL_BASED_OUTPATIENT_CLINIC_OR_DEPARTMENT_OTHER): Payer: BC Managed Care – PPO | Admitting: Oncology

## 2014-10-26 VITALS — BP 112/66 | HR 93 | Temp 98.1°F | Resp 18 | Ht 60.0 in | Wt 147.8 lb

## 2014-10-26 DIAGNOSIS — Z5111 Encounter for antineoplastic chemotherapy: Secondary | ICD-10-CM

## 2014-10-26 DIAGNOSIS — C50211 Malignant neoplasm of upper-inner quadrant of right female breast: Secondary | ICD-10-CM

## 2014-10-26 LAB — CBC WITH DIFFERENTIAL/PLATELET
BASO%: 1.1 % (ref 0.0–2.0)
Basophils Absolute: 0 10*3/uL (ref 0.0–0.1)
EOS%: 0.1 % (ref 0.0–7.0)
Eosinophils Absolute: 0 10*3/uL (ref 0.0–0.5)
HEMATOCRIT: 26.2 % — AB (ref 34.8–46.6)
HGB: 8.6 g/dL — ABNORMAL LOW (ref 11.6–15.9)
LYMPH%: 13.6 % — AB (ref 14.0–49.7)
MCH: 32.1 pg (ref 25.1–34.0)
MCHC: 33 g/dL (ref 31.5–36.0)
MCV: 97.3 fL (ref 79.5–101.0)
MONO#: 0.4 10*3/uL (ref 0.1–0.9)
MONO%: 11.3 % (ref 0.0–14.0)
NEUT#: 2.9 10*3/uL (ref 1.5–6.5)
NEUT%: 73.9 % (ref 38.4–76.8)
Platelets: 277 10*3/uL (ref 145–400)
RBC: 2.69 10*6/uL — ABNORMAL LOW (ref 3.70–5.45)
RDW: 15.5 % — ABNORMAL HIGH (ref 11.2–14.5)
WBC: 4 10*3/uL (ref 3.9–10.3)
lymph#: 0.5 10*3/uL — ABNORMAL LOW (ref 0.9–3.3)

## 2014-10-26 LAB — COMPREHENSIVE METABOLIC PANEL (CC13)
ALT: 24 U/L (ref 0–55)
ANION GAP: 8 meq/L (ref 3–11)
AST: 18 U/L (ref 5–34)
Albumin: 3.2 g/dL — ABNORMAL LOW (ref 3.5–5.0)
Alkaline Phosphatase: 65 U/L (ref 40–150)
BILIRUBIN TOTAL: 0.25 mg/dL (ref 0.20–1.20)
BUN: 11.1 mg/dL (ref 7.0–26.0)
CO2: 25 mEq/L (ref 22–29)
CREATININE: 0.8 mg/dL (ref 0.6–1.1)
Calcium: 9 mg/dL (ref 8.4–10.4)
Chloride: 106 mEq/L (ref 98–109)
EGFR: 83 mL/min/{1.73_m2} — ABNORMAL LOW (ref >90)
Glucose: 72 mg/dl (ref 70–140)
Potassium: 3.6 mEq/L (ref 3.5–5.1)
SODIUM: 140 meq/L (ref 136–145)
Total Protein: 5.8 g/dL — ABNORMAL LOW (ref 6.4–8.3)

## 2014-10-26 MED ORDER — DIPHENHYDRAMINE HCL 50 MG/ML IJ SOLN
INTRAMUSCULAR | Status: AC
  Filled 2014-10-26: qty 1

## 2014-10-26 MED ORDER — DIPHENHYDRAMINE HCL 50 MG/ML IJ SOLN
25.0000 mg | Freq: Once | INTRAMUSCULAR | Status: AC
  Administered 2014-10-26: 25 mg via INTRAVENOUS

## 2014-10-26 MED ORDER — HEPARIN SOD (PORK) LOCK FLUSH 100 UNIT/ML IV SOLN
500.0000 [IU] | Freq: Once | INTRAVENOUS | Status: AC | PRN
  Administered 2014-10-26: 500 [IU]
  Filled 2014-10-26: qty 5

## 2014-10-26 MED ORDER — DEXAMETHASONE SODIUM PHOSPHATE 10 MG/ML IJ SOLN
INTRAMUSCULAR | Status: AC
  Filled 2014-10-26: qty 1

## 2014-10-26 MED ORDER — FAMOTIDINE IN NACL 20-0.9 MG/50ML-% IV SOLN
20.0000 mg | Freq: Once | INTRAVENOUS | Status: AC
  Administered 2014-10-26: 20 mg via INTRAVENOUS

## 2014-10-26 MED ORDER — DEXAMETHASONE SODIUM PHOSPHATE 10 MG/ML IJ SOLN
10.0000 mg | Freq: Once | INTRAMUSCULAR | Status: AC
  Administered 2014-10-26: 10 mg via INTRAVENOUS

## 2014-10-26 MED ORDER — ONDANSETRON 8 MG/50ML IVPB (CHCC)
8.0000 mg | Freq: Once | INTRAVENOUS | Status: AC
  Administered 2014-10-26: 8 mg via INTRAVENOUS

## 2014-10-26 MED ORDER — SODIUM CHLORIDE 0.9 % IV SOLN
Freq: Once | INTRAVENOUS | Status: AC
  Administered 2014-10-26: 10:00:00 via INTRAVENOUS

## 2014-10-26 MED ORDER — FAMOTIDINE IN NACL 20-0.9 MG/50ML-% IV SOLN
INTRAVENOUS | Status: AC
  Filled 2014-10-26: qty 50

## 2014-10-26 MED ORDER — ONDANSETRON 8 MG/NS 50 ML IVPB
INTRAVENOUS | Status: AC
  Filled 2014-10-26: qty 8

## 2014-10-26 MED ORDER — DEXTROSE 5 % IV SOLN
80.0000 mg/m2 | Freq: Once | INTRAVENOUS | Status: AC
  Administered 2014-10-26: 132 mg via INTRAVENOUS
  Filled 2014-10-26: qty 22

## 2014-10-26 MED ORDER — SODIUM CHLORIDE 0.9 % IJ SOLN
10.0000 mL | INTRAMUSCULAR | Status: DC | PRN
  Administered 2014-10-26: 10 mL
  Filled 2014-10-26: qty 10

## 2014-10-26 NOTE — Progress Notes (Signed)
[facility]  [phone]) [phone] Fax:(336) [phone]     ID: Melissa Garza DOB: 07/04/1956  MR#: [id number]  VFI#:[id number]  Patient Care Team: No Pcp Per Patient as PCP - General (General Practice) Gus Height, MD as Consulting Physician (Obstetrics and Gynecology) Excell Seltzer, MD as Consulting Physician (General Surgery) Chauncey Cruel, MD as Consulting Physician (Oncology) Rexene Edison, MD as Consulting Physician (Radiation Oncology) OTHER MD: Chucky May MD  CHIEF COMPLAINT: Triple negative breast cancer  CURRENT TREATMENT: Neoadjuvant chemotherapy   BREAST CANCER HISTORY: From the original intake note:  Melissa Garza herself palpated a mass in her right breast approximately mid-September. She brought this to her gynecologist attention and he set her up for bilateral diagnostic mammography at the breast center 08/04/2014. The breast density was category C. in the patient has bilateral saline implants in place. In the palpable area of concern in the right breast there was an irregular mass measuring up to 2.5 cm. By palpation this was firm at the 12:30 o'clock position. Ultrasound of the right breast confirmed an irregular hypoechoic mass in this area measuring 2.5 cm. There was no right axillary lymphadenopathy noted.  Biopsy of the mass in question 08/04/2014 showed ([id number]) invasive ductal carcinoma, grade 3, triple negative, with an MIB-1 of 90%.  Bilateral breast MRI 08/09/2014 showed in the upper inner quadrant of the right breast an irregular enhancing mass measuring 2.8 cm. There was a 4 mm satellite nodule anterior to this and separated from that by 0.6 cm. The left breast, the remaining of the right breast and the lymph node areas were negative.  The patient's subsequent history is as detailed below  INTERVAL HISTORY: Melissa Garza returns today for follow up of her breast cancer by herself. Today is day 1, cycle 2 of 12 planned cycles of weekly  paclitaxel.  REVIEW OF SYSTEMS: Melissa Garza tolerated her first cycle of Taxol well. She had fatigue 1-2 days and had to rest more. Had a lot of energy yesterday and was able to run errands and clean her home. She denies fevers, chest pain, shortness of breath, abdominal pain, nausea, vomiting, and bleeding. Denies neuropathy. Reports mild dyspnea on exertion when she goes up a flight of stairs. She has hot flashes frequently. A detailed review of systems was otherwise stable  PAST MEDICAL HISTORY: Past Medical History  Diagnosis Date  . Glaucoma   . Anxiety   . Hot flashes   . Depression   . PONV (postoperative nausea and vomiting)   . Headache     chronic  . Shortness of breath     with exertion  . GERD (gastroesophageal reflux disease)     PMH  . Cancer     cancer of right breast  . Hepatitis     Hx: of Hepatitis not sure which one    PAST SURGICAL HISTORY: Past Surgical History  Procedure Laterality Date  . Tonsilectomy/adenoidectomy with myringotomy    . Breast enhancement surgery  2008  . Eye surgery      Lasik  . Colonoscopy    . Breast surgery      Biopsy  . Dilation and curettage of uterus    . Tubal ligation    . Portacath placement N/A 08/13/2014    Procedure: INSERTION PORT-A-CATH;  Surgeon: Excell Seltzer, MD;  Location: [facility] OR;  Service: General;  Laterality: N/A;    FAMILY HISTORY Family History  Problem Relation Age of Onset  . Psoriasis Mother   .  Hypertension Father   . Asthma Other   . Thyroid disease Other    The patient's parents are currently alive, in their late 42s. The patient had no brothers, 2 sisters. There is no history of breast or ovarian cancer in the family to her knowledge.   GYNECOLOGIC HISTORY:  No LMP recorded. Patient is postmenopausal. Menarche age 36, first live birth age 56. She is GX P2. She stopped having periods in 2005 and started hormone replacement at that time. 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 Her husband, [name], is disabled secondary to multiple cardiac problems. Son [name] lives in [place] and works in apartment maintenance. Son [name] is his wife) works as an Clinical biochemist. The patient has 6 grandchildren. She is not a Ambulance person.   ADVANCED DIRECTIVES: Not in place   HEALTH MAINTENANCE: History  Substance Use Topics  . Smoking status: Never Smoker   . Smokeless tobacco: Never Used  . Alcohol Use: No     Colonoscopy:Remote  PAP:2014  Bone density:Never  Lipid panel:  Allergies  Allergen Reactions  . Hydrocodone Hives and Itching    Current Outpatient Prescriptions  Medication Sig Dispense Refill  . ALPRAZolam (XANAX) 1 MG tablet Take 1 mg by mouth 4 (four) times daily as needed for anxiety.    Marland Kitchen dexamethasone (DECADRON) 4 MG tablet Take 2 tablets (8 mg total) by mouth as directed. Take two tablets the day after chemotherapy, then two tablets BID for two days. 30 tablet 3  . FLUoxetine (PROZAC) 40 MG capsule Take 40 mg by mouth daily.    Marland Kitchen lidocaine-prilocaine (EMLA) cream Apply 1 application topically as needed. Apply to portacath  1 1/2 - 2 hours as needed prior to procedures. 30 g 0  . methylphenidate 54 MG PO CR tablet Take 54 mg by mouth every morning.    . nystatin-triamcinolone (MYCOLOG II) cream Apply 1 application topically daily as needed.     . ondansetron (ZOFRAN) 8 MG tablet Take 1 tablet (8 mg total) by mouth 2 (two) times daily. Start the day after chemo for 2 days. Then take as needed for nausea or vomiting. 30 tablet 1  . Polyvinyl Alcohol-Povidone (REFRESH OP) Place 2 drops into both eyes daily as needed (ALLERGIES).    Marland Kitchen prochlorperazine (COMPAZINE) 10 MG tablet Take 1 tablet (10 mg total) by mouth every 6 (six) hours as needed (Nausea or vomiting). 30 tablet 1  . travoprost, benzalkonium, (TRAVATAN) 0.004 % ophthalmic  solution Place 1 drop into both eyes at bedtime.     . traZODone (DESYREL) 50 MG tablet Take 50 mg by mouth at bedtime as needed for sleep.    Marland Kitchen zolpidem (AMBIEN) 10 MG tablet Take 10 mg by mouth at bedtime as needed for sleep.    Marland Kitchen acetaminophen (TYLENOL) 500 MG tablet Take 1,000 mg by mouth every 6 (six) hours as needed for mild pain.    . Armodafinil (NUVIGIL) 250 MG tablet Take 250 mg by mouth once a week.     . butalbital-acetaminophen-caffeine (FIORICET, ESGIC) 50-325-40 MG per tablet Take 1 tablet by mouth 2 (two) times daily as needed for headache.     . CLOBETASOL PROPIONATE EX Apply 1 application topically daily as needed (DRY SCALP).    . clotrimazole-betamethasone (LOTRISONE) cream Apply 1 application topically 2 (two) times daily. (  Patient not taking: Reported on 10/26/2014) 30 g 0  . famotidine (PEPCID) 20 MG tablet Take 20 mg by mouth as needed.     . fluticasone (CUTIVATE) 0.05 % cream Apply 1 application topically daily as needed (psoriasis).     . LORazepam (ATIVAN) 0.5 MG tablet Take 1 tablet (0.5 mg total) by mouth every 8 (eight) hours. (Patient not taking: Reported on 10/26/2014) 30 tablet 0   No current facility-administered medications for this visit.    OBJECTIVE:  Middle-aged white woman who appears stated age 58 Vitals:   10/26/14 0942  BP: 112/66  Pulse: 93  Temp: 98.1 F (36.7 C)  Resp: 18     Body mass index is 28.87 kg/(m^2).    ECOG FS:1 - Symptomatic but completely ambulatory   Sclerae unicteric, pupils round and equal Oropharynx clear and moist-- no thrush or other lesions noted No cervical or supraclavicular adenopathy Lungs no rales or rhonchi Heart regular rate and rhythm Abd soft, nontender, positive bowel sounds MSK no focal spinal tenderness, no upper extremity lymphedema Neuro: nonfocal, well oriented, appropriate affect Breasts: Deferred.   LAB RESULTS:  CMP     Component Value Date/Time   NA 140 10/26/2014 0925   NA 143  06/17/2009 1137   K 3.6 10/26/2014 0925   K 4.7 06/17/2009 1137   CL 114* 06/17/2009 1137   CO2 25 10/26/2014 0925   CO2 24 06/17/2009 1137   GLUCOSE 72 10/26/2014 0925   GLUCOSE 97 06/17/2009 1137   GLUCOSE 81 10/22/2006 1253   BUN 11.1 10/26/2014 0925   BUN 16 06/17/2009 1137   CREATININE 0.8 10/26/2014 0925   CREATININE 1.2 06/17/2009 1137   CALCIUM 9.0 10/26/2014 0925   CALCIUM 9.4 06/17/2009 1137   PROT 5.8* 10/26/2014 0925   PROT 7.0 06/17/2009 1137   ALBUMIN 3.2* 10/26/2014 0925   ALBUMIN 3.9 06/17/2009 1137   AST 18 10/26/2014 0925   AST 15 06/17/2009 1137   ALT 24 10/26/2014 0925   ALT 13 06/17/2009 1137   ALKPHOS 65 10/26/2014 0925   ALKPHOS 55 06/17/2009 1137   BILITOT 0.25 10/26/2014 0925   BILITOT 0.7 06/17/2009 1137   GFRNONAA 49.82 06/17/2009 1137    I No results found for: SPEP  Lab Results  Component Value Date   WBC 4.0 10/26/2014   NEUTROABS 2.9 10/26/2014   HGB 8.6* 10/26/2014   HCT 26.2* 10/26/2014   MCV 97.3 10/26/2014   PLT 277 10/26/2014      Chemistry      Component Value Date/Time   NA 140 10/26/2014 0925   NA 143 06/17/2009 1137   K 3.6 10/26/2014 0925   K 4.7 06/17/2009 1137   CL 114* 06/17/2009 1137   CO2 25 10/26/2014 0925   CO2 24 06/17/2009 1137   BUN 11.1 10/26/2014 0925   BUN 16 06/17/2009 1137   CREATININE 0.8 10/26/2014 0925   CREATININE 1.2 06/17/2009 1137      Component Value Date/Time   CALCIUM 9.0 10/26/2014 0925   CALCIUM 9.4 06/17/2009 1137   ALKPHOS 65 10/26/2014 0925   ALKPHOS 55 06/17/2009 1137   AST 18 10/26/2014 0925   AST 15 06/17/2009 1137   ALT 24 10/26/2014 0925   ALT 13 06/17/2009 1137   BILITOT 0.25 10/26/2014 0925   BILITOT 0.7 06/17/2009 1137      No results found for: LABCA2  No components found for: [id number]  No results for input(s): INR in the last 168 hours.  Urinalysis No results found for: COLORURINE  STUDIES: Mr Breast Bilateral W Wo Contrast  10/08/2014   CLINICAL DATA:   Personal history of right breast cancer assess for response to neoadjuvant therapy.  LABS:  Does not apply  EXAM: BILATERAL BREAST MRI WITH AND WITHOUT CONTRAST  TECHNIQUE: Multiplanar, multisequence MR images of both breasts were obtained prior to and following the intravenous administration of 55ml of MultiHance.  THREE-DIMENSIONAL MR IMAGE RENDERING ON INDEPENDENT WORKSTATION:  Three-dimensional MR images were rendered by post-processing of the original MR data on an independent workstation. The three-dimensional MR images were interpreted, and findings are reported in the following complete MRI report for this study. Three dimensional images were evaluated at the independent DynaCad workstation  COMPARISON:  Previous exams including MRI of breast August 09, 2014  FINDINGS: Breast composition: c:  Heterogeneous fibroglandular tissue  Background parenchymal enhancement: Mild  Right breast: The previously noted enhancing mass with central biopsy clip in the medial upper right breast is significantly decreased in size currently measuring 8 x 15 x 13 mm. No other foci of abnormal enhancement is identified.  Left breast: No mass or abnormal enhancement.  Lymph nodes: No abnormal appearing lymph nodes.  Ancillary findings:  None.  IMPRESSION: Right breast cancer is significantly smaller on the current exam. No other foci of abnormal enhancement is identified within the right breast.  RECOMMENDATION: Treatment plan.  BI-RADS CATEGORY  6: Known biopsy-proven malignancy.   Electronically Signed   By: Abelardo Diesel M.D.   On: 10/08/2014 14:40    ASSESSMENT: 58 y.o. [place] woman status post right breast upper inner quadrant biopsy 08/04/2014 for a clinical T2 N0, stage IIA invasive ductal carcinoma, grade 3, triple negative, with an MIB-1 of 90%.  1. neoadjuvant chemotherapy with doxorubicin and cyclophosphamide dose dense x4 completed on 10/05/14; followed by paclitaxel weekly x12 started 10/19/2014  2. Surgery  to follow chemotherapy  3. Radiation to follow surgery  PLAN: Melissa Garza has tolerated her first dose of Taxol well. I recommend that she proceed with cycle 2 Taxol today. Her hemoglobin today is slightly low at 8.6, but she is only mildly symptomatic. We will watch this closely.  Melissa Garza will see [place] on a weekly basis until she completes her paclitaxel treatments. She understands the overall plan and agrees with it. She knows the goal of treatment in her case is cure. She will call with any problems that may develop before the next visit here.    Mikey Bussing, [place] [phone] 10/26/2014 10:07 AM

## 2014-11-02 ENCOUNTER — Ambulatory Visit (HOSPITAL_BASED_OUTPATIENT_CLINIC_OR_DEPARTMENT_OTHER): Payer: BC Managed Care – PPO | Admitting: Nurse Practitioner

## 2014-11-02 ENCOUNTER — Other Ambulatory Visit (HOSPITAL_BASED_OUTPATIENT_CLINIC_OR_DEPARTMENT_OTHER): Payer: BC Managed Care – PPO

## 2014-11-02 ENCOUNTER — Encounter: Payer: Self-pay | Admitting: Nurse Practitioner

## 2014-11-02 ENCOUNTER — Ambulatory Visit (HOSPITAL_BASED_OUTPATIENT_CLINIC_OR_DEPARTMENT_OTHER): Payer: BC Managed Care – PPO

## 2014-11-02 VITALS — BP 119/75 | HR 124 | Temp 98.3°F | Resp 18 | Ht 60.0 in | Wt 149.5 lb

## 2014-11-02 DIAGNOSIS — C50211 Malignant neoplasm of upper-inner quadrant of right female breast: Secondary | ICD-10-CM

## 2014-11-02 DIAGNOSIS — R232 Flushing: Secondary | ICD-10-CM | POA: Insufficient documentation

## 2014-11-02 DIAGNOSIS — N951 Menopausal and female climacteric states: Secondary | ICD-10-CM

## 2014-11-02 DIAGNOSIS — Z5111 Encounter for antineoplastic chemotherapy: Secondary | ICD-10-CM

## 2014-11-02 LAB — CBC WITH DIFFERENTIAL/PLATELET
BASO%: 1.2 % (ref 0.0–2.0)
BASOS ABS: 0.1 10*3/uL (ref 0.0–0.1)
EOS ABS: 0 10*3/uL (ref 0.0–0.5)
EOS%: 0.5 % (ref 0.0–7.0)
HCT: 30.3 % — ABNORMAL LOW (ref 34.8–46.6)
HEMOGLOBIN: 9.9 g/dL — AB (ref 11.6–15.9)
LYMPH#: 0.8 10*3/uL — AB (ref 0.9–3.3)
LYMPH%: 18.6 % (ref 14.0–49.7)
MCH: 32.8 pg (ref 25.1–34.0)
MCHC: 32.7 g/dL (ref 31.5–36.0)
MCV: 100.1 fL (ref 79.5–101.0)
MONO#: 0.5 10*3/uL (ref 0.1–0.9)
MONO%: 11.3 % (ref 0.0–14.0)
NEUT%: 68.4 % (ref 38.4–76.8)
NEUTROS ABS: 3.1 10*3/uL (ref 1.5–6.5)
Platelets: 209 10*3/uL (ref 145–400)
RBC: 3.03 10*6/uL — ABNORMAL LOW (ref 3.70–5.45)
RDW: 17.8 % — AB (ref 11.2–14.5)
WBC: 4.5 10*3/uL (ref 3.9–10.3)

## 2014-11-02 LAB — COMPREHENSIVE METABOLIC PANEL (CC13)
ALK PHOS: 74 U/L (ref 40–150)
ALT: 24 U/L (ref 0–55)
ANION GAP: 8 meq/L (ref 3–11)
AST: 21 U/L (ref 5–34)
Albumin: 3.2 g/dL — ABNORMAL LOW (ref 3.5–5.0)
BILIRUBIN TOTAL: 0.36 mg/dL (ref 0.20–1.20)
BUN: 13 mg/dL (ref 7.0–26.0)
CO2: 26 meq/L (ref 22–29)
CREATININE: 1.1 mg/dL (ref 0.6–1.1)
Calcium: 9 mg/dL (ref 8.4–10.4)
Chloride: 106 mEq/L (ref 98–109)
EGFR: 57 mL/min/{1.73_m2} — ABNORMAL LOW (ref >90)
Glucose: 101 mg/dl (ref 70–140)
Potassium: 3.7 mEq/L (ref 3.5–5.1)
Sodium: 140 mEq/L (ref 136–145)
Total Protein: 6.2 g/dL — ABNORMAL LOW (ref 6.4–8.3)

## 2014-11-02 MED ORDER — FAMOTIDINE IN NACL 20-0.9 MG/50ML-% IV SOLN
20.0000 mg | Freq: Once | INTRAVENOUS | Status: AC
  Administered 2014-11-02: 20 mg via INTRAVENOUS

## 2014-11-02 MED ORDER — ONDANSETRON 8 MG/NS 50 ML IVPB
INTRAVENOUS | Status: AC
  Filled 2014-11-02: qty 8

## 2014-11-02 MED ORDER — DIPHENHYDRAMINE HCL 50 MG/ML IJ SOLN
INTRAMUSCULAR | Status: AC
  Filled 2014-11-02: qty 1

## 2014-11-02 MED ORDER — GABAPENTIN 300 MG PO CAPS: 300.0000 mg | ORAL_CAPSULE | Freq: Every day | ORAL | Status: DC

## 2014-11-02 MED ORDER — DIPHENHYDRAMINE HCL 50 MG/ML IJ SOLN
25.0000 mg | Freq: Once | INTRAMUSCULAR | Status: AC
  Administered 2014-11-02: 25 mg via INTRAVENOUS

## 2014-11-02 MED ORDER — DEXAMETHASONE SODIUM PHOSPHATE 10 MG/ML IJ SOLN
INTRAMUSCULAR | Status: AC
  Filled 2014-11-02: qty 1

## 2014-11-02 MED ORDER — SODIUM CHLORIDE 0.9 % IV SOLN
Freq: Once | INTRAVENOUS | Status: AC
  Administered 2014-11-02: 13:00:00 via INTRAVENOUS

## 2014-11-02 MED ORDER — ONDANSETRON 8 MG/50ML IVPB (CHCC)
8.0000 mg | Freq: Once | INTRAVENOUS | Status: AC
  Administered 2014-11-02: 8 mg via INTRAVENOUS

## 2014-11-02 MED ORDER — SODIUM CHLORIDE 0.9 % IV SOLN: Freq: Once | INTRAVENOUS | Status: DC

## 2014-11-02 MED ORDER — SODIUM CHLORIDE 0.9 % IJ SOLN
10.0000 mL | INTRAMUSCULAR | Status: DC | PRN
  Administered 2014-11-02: 10 mL
  Filled 2014-11-02: qty 10

## 2014-11-02 MED ORDER — HEPARIN SOD (PORK) LOCK FLUSH 100 UNIT/ML IV SOLN
500.0000 [IU] | Freq: Once | INTRAVENOUS | Status: AC | PRN
  Administered 2014-11-02: 500 [IU]
  Filled 2014-11-02: qty 5

## 2014-11-02 MED ORDER — DEXAMETHASONE SODIUM PHOSPHATE 10 MG/ML IJ SOLN
4.0000 mg | Freq: Once | INTRAMUSCULAR | Status: AC
  Administered 2014-11-02: 4 mg via INTRAVENOUS

## 2014-11-02 MED ORDER — FAMOTIDINE IN NACL 20-0.9 MG/50ML-% IV SOLN
INTRAVENOUS | Status: AC
  Filled 2014-11-02: qty 50

## 2014-11-02 MED ORDER — PACLITAXEL CHEMO INJECTION 300 MG/50ML
80.0000 mg/m2 | Freq: Once | INTRAVENOUS | Status: AC
  Administered 2014-11-02: 132 mg via INTRAVENOUS
  Filled 2014-11-02: qty 22

## 2014-11-02 NOTE — Progress Notes (Signed)
[facility]  [phone] [phone]     ID: Melissa Garza DOB: 03/26/1956  MR#: [id number]  IFO#:[id number]  Patient Care Team: No Pcp Per Patient as PCP - General (General Practice) Gus Height, MD as Consulting Physician (Obstetrics and Gynecology) Excell Seltzer, MD as Consulting Physician (General Surgery) Chauncey Cruel, MD as Consulting Physician (Oncology) Rexene Edison, MD as Consulting Physician (Radiation Oncology) OTHER MD: Chucky May MD  CHIEF COMPLAINT: Triple negative breast cancer  CURRENT TREATMENT: Neoadjuvant chemotherapy   BREAST CANCER HISTORY: From the original intake note:  Melissa Garza herself palpated a mass in her right breast approximately mid-September. She brought this to her gynecologist attention and he set her up for bilateral diagnostic mammography at the breast center 08/04/2014. The breast density was category C. in the patient has bilateral saline implants in place. In the palpable area of concern in the right breast there was an irregular mass measuring up to 2.5 cm. By palpation this was firm at the 12:30 o'clock position. Ultrasound of the right breast confirmed an irregular hypoechoic mass in this area measuring 2.5 cm. There was no right axillary lymphadenopathy noted.  Biopsy of the mass in question 08/04/2014 showed ([id number]) invasive ductal carcinoma, grade 3, triple negative, with an MIB-1 of 90%.  Bilateral breast MRI 08/09/2014 showed in the upper inner quadrant of the right breast an irregular enhancing mass measuring 2.8 cm. There was a 4 mm satellite nodule anterior to this and separated from that by 0.6 cm. The left breast, the remaining of the right breast and the lymph node areas were negative.  The patient's subsequent history is as detailed below  INTERVAL HISTORY: [name] returns today for follow up of her breast cancer, accompanied by her mother.  Today is day 1, cycle 3 of 12 planned  cycles of weekly paclitaxel.  REVIEW OF SYSTEMS: Jolanta denies fevers or chills. She has some mild "queeziness" but this is resolved with PRN antiemetics. She has mild constipation that is resolving with stool softeners every other day. Her appetite is decreased because nothing tastes pleasant. Her hot flashes are now waking her up nightly. She had numbness to her right fingers for a total of 20 minutes last week. Her right great toenail is starting to come up. Her energy level is up and down. She denies shortness of breath, chest pain, cough, or palpitations. A detailed review of systems is otherwise negative.   PAST MEDICAL HISTORY: Past Medical History  Diagnosis Date  . Glaucoma   . Anxiety   . Hot flashes   . Depression   . PONV (postoperative nausea and vomiting)   . Headache     chronic  . Shortness of breath     with exertion  . GERD (gastroesophageal reflux disease)     PMH  . Cancer     cancer of right breast  . Hepatitis     Hx: of Hepatitis not sure which one    PAST SURGICAL HISTORY: Past Surgical History  Procedure Laterality Date  . Tonsilectomy/adenoidectomy with myringotomy    . Breast enhancement surgery  2008  . Eye surgery      Lasik  . Colonoscopy    . Breast surgery      Biopsy  . Dilation and curettage of uterus    . Tubal ligation    . Portacath placement N/A 08/13/2014    Procedure: INSERTION PORT-A-CATH;  Surgeon: Excell Seltzer, MD;  Location: [facility];  Service:  General;  Laterality: N/A;    FAMILY HISTORY Family History  Problem Relation Age of Onset  . Psoriasis Mother   . Hypertension Father   . Asthma Other   . Thyroid disease Other    The patient's parents are currently alive, in their late 59s. The patient had no brothers, 2 sisters. There is no history of breast or ovarian cancer in the family to her knowledge.   GYNECOLOGIC HISTORY:  No LMP recorded. Patient is postmenopausal. Menarche age 69, first live birth age 57. She is GX  P2. She stopped having periods in 2005 and started hormone replacement at that time. 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 Her husband, [name], is disabled secondary to multiple cardiac problems. Son [name] lives in [place] and works in apartment maintenance. Son [name] is his wife) works as an Clinical biochemist. The patient has 6 grandchildren. She is not a Ambulance person.   ADVANCED DIRECTIVES: Not in place   HEALTH MAINTENANCE: History  Substance Use Topics  . Smoking status: Never Smoker   . Smokeless tobacco: Never Used  . Alcohol Use: No     Colonoscopy:Remote  PAP:2014  Bone density:Never  Lipid panel:  Allergies  Allergen Reactions  . Hydrocodone Hives and Itching    Current Outpatient Prescriptions  Medication Sig Dispense Refill  . ALPRAZolam (XANAX) 1 MG tablet Take 1 mg by mouth 4 (four) times daily as needed for anxiety.    Marland Kitchen FLUoxetine (PROZAC) 40 MG capsule Take 40 mg by mouth daily.    Marland Kitchen lidocaine-prilocaine (EMLA) cream Apply 1 application topically as needed. Apply to portacath  1 1/2 - 2 hours as needed prior to procedures. 30 g 0  . LORazepam (ATIVAN) 0.5 MG tablet Take 1 tablet (0.5 mg total) by mouth every 8 (eight) hours. 30 tablet 0  . methylphenidate 54 MG PO CR tablet Take 54 mg by mouth every morning.    . ondansetron (ZOFRAN) 8 MG tablet Take 1 tablet (8 mg total) by mouth 2 (two) times daily. Start the day after chemo for 2 days. Then take as needed for nausea or vomiting. 30 tablet 1  . prochlorperazine (COMPAZINE) 10 MG tablet Take 1 tablet (10 mg total) by mouth every 6 (six) hours as needed (Nausea or vomiting). 30 tablet 1  . travoprost, benzalkonium, (TRAVATAN) 0.004 % ophthalmic solution Place 1 drop into both eyes at bedtime.     . traZODone (DESYREL) 50 MG tablet Take 50 mg by mouth at bedtime as  needed for sleep.    Marland Kitchen zolpidem (AMBIEN) 10 MG tablet Take 10 mg by mouth at bedtime as needed for sleep.    Marland Kitchen acetaminophen (TYLENOL) 500 MG tablet Take 1,000 mg by mouth every 6 (six) hours as needed for mild pain.    . Armodafinil (NUVIGIL) 250 MG tablet Take 250 mg by mouth once a week.     . butalbital-acetaminophen-caffeine (FIORICET, ESGIC) 50-325-40 MG per tablet Take 1 tablet by mouth 2 (two) times daily as needed for headache.     . CLOBETASOL PROPIONATE EX Apply 1 application topically daily as needed (DRY SCALP).    . clotrimazole-betamethasone (LOTRISONE) cream Apply 1 application topically 2 (two) times daily. (Patient not taking: Reported on 10/26/2014) 30 g 0  . famotidine (PEPCID) 20 MG tablet Take 20 mg by mouth as  needed.     . fluticasone (CUTIVATE) 0.05 % cream Apply 1 application topically daily as needed (psoriasis).     . gabapentin (NEURONTIN) 300 MG capsule Take 1 capsule (300 mg total) by mouth at bedtime. 30 capsule 2  . nystatin-triamcinolone (MYCOLOG II) cream Apply 1 application topically daily as needed.     . Polyvinyl Alcohol-Povidone (REFRESH OP) Place 2 drops into both eyes daily as needed (ALLERGIES).     No current facility-administered medications for this visit.    OBJECTIVE:  Middle-aged white woman who appears stated age 58 Vitals:   11/02/14 1136  BP: 119/75  Pulse: 124  Temp: 98.3 F (36.8 C)  Resp: 18     Body mass index is 29.2 kg/(m^2).    ECOG FS:1 - Symptomatic but completely ambulatory   Skin: warm, dry, right great toe discolored HEENT: sclerae anicteric, conjunctivae pink, oropharynx clear. No thrush or mucositis.  Lymph Nodes: No cervical or supraclavicular lymphadenopathy  Lungs: clear to auscultation bilaterally, no rales, wheezes, or rhonci  Heart: regular rate and rhythm  Abdomen: round, soft, non tender, positive bowel sounds  Musculoskeletal: No focal spinal tenderness, no peripheral edema  Neuro: non focal, well  oriented, positive affect  Breasts:   LAB RESULTS:  CMP     Component Value Date/Time   NA 140 11/02/2014 1121   NA 143 06/17/2009 1137   K 3.7 11/02/2014 1121   K 4.7 06/17/2009 1137   CL 114* 06/17/2009 1137   CO2 26 11/02/2014 1121   CO2 24 06/17/2009 1137   GLUCOSE 101 11/02/2014 1121   GLUCOSE 97 06/17/2009 1137   GLUCOSE 81 10/22/2006 1253   BUN 13.0 11/02/2014 1121   BUN 16 06/17/2009 1137   CREATININE 1.1 11/02/2014 1121   CREATININE 1.2 06/17/2009 1137   CALCIUM 9.0 11/02/2014 1121   CALCIUM 9.4 06/17/2009 1137   PROT 6.2* 11/02/2014 1121   PROT 7.0 06/17/2009 1137   ALBUMIN 3.2* 11/02/2014 1121   ALBUMIN 3.9 06/17/2009 1137   AST 21 11/02/2014 1121   AST 15 06/17/2009 1137   ALT 24 11/02/2014 1121   ALT 13 06/17/2009 1137   ALKPHOS 74 11/02/2014 1121   ALKPHOS 55 06/17/2009 1137   BILITOT 0.36 11/02/2014 1121   BILITOT 0.7 06/17/2009 1137   GFRNONAA 49.82 06/17/2009 1137    I No results found for: SPEP  Lab Results  Component Value Date   WBC 4.5 11/02/2014   NEUTROABS 3.1 11/02/2014   HGB 9.9* 11/02/2014   HCT 30.3* 11/02/2014   MCV 100.1 11/02/2014   PLT 209 11/02/2014      Chemistry      Component Value Date/Time   NA 140 11/02/2014 1121   NA 143 06/17/2009 1137   K 3.7 11/02/2014 1121   K 4.7 06/17/2009 1137   CL 114* 06/17/2009 1137   CO2 26 11/02/2014 1121   CO2 24 06/17/2009 1137   BUN 13.0 11/02/2014 1121   BUN 16 06/17/2009 1137   CREATININE 1.1 11/02/2014 1121   CREATININE 1.2 06/17/2009 1137      Component Value Date/Time   CALCIUM 9.0 11/02/2014 1121   CALCIUM 9.4 06/17/2009 1137   ALKPHOS 74 11/02/2014 1121   ALKPHOS 55 06/17/2009 1137   AST 21 11/02/2014 1121   AST 15 06/17/2009 1137   ALT 24 11/02/2014 1121   ALT 13 06/17/2009 1137   BILITOT 0.36 11/02/2014 1121   BILITOT 0.7 06/17/2009 1137      No results  found for: LABCA2  No components found for: PPIRJ188  No results for input(s): INR in the last 168  hours.  Urinalysis No results found for: COLORURINE  STUDIES: Mr Breast Bilateral W Wo Contrast  10/08/2014   CLINICAL DATA:  Personal history of right breast cancer assess for response to neoadjuvant therapy.  LABS:  Does not apply  EXAM: BILATERAL BREAST MRI WITH AND WITHOUT CONTRAST  TECHNIQUE: Multiplanar, multisequence MR images of both breasts were obtained prior to and following the intravenous administration of 20ml of MultiHance.  THREE-DIMENSIONAL MR IMAGE RENDERING ON INDEPENDENT WORKSTATION:  Three-dimensional MR images were rendered by post-processing of the original MR data on an independent workstation. The three-dimensional MR images were interpreted, and findings are reported in the following complete MRI report for this study. Three dimensional images were evaluated at the independent DynaCad workstation  COMPARISON:  Previous exams including MRI of breast August 09, 2014  FINDINGS: Breast composition: c:  Heterogeneous fibroglandular tissue  Background parenchymal enhancement: Mild  Right breast: The previously noted enhancing mass with central biopsy clip in the medial upper right breast is significantly decreased in size currently measuring 8 x 15 x 13 mm. No other foci of abnormal enhancement is identified.  Left breast: No mass or abnormal enhancement.  Lymph nodes: No abnormal appearing lymph nodes.  Ancillary findings:  None.  IMPRESSION: Right breast cancer is significantly smaller on the current exam. No other foci of abnormal enhancement is identified within the right breast.  RECOMMENDATION: Treatment plan.  BI-RADS CATEGORY  6: Known biopsy-proven malignancy.   Electronically Signed   By: Abelardo Diesel M.D.   On: 10/08/2014 14:40    ASSESSMENT: 58 y.o. Lane woman status post right breast upper inner quadrant biopsy 08/04/2014 for a clinical T2 N0, stage IIA invasive ductal carcinoma, grade 3, triple negative, with an MIB-1 of 90%.  1. neoadjuvant chemotherapy with  doxorubicin and cyclophosphamide dose dense x4 completed on 10/05/14; followed by paclitaxel weekly x12 started 10/19/2014  2. Surgery to follow chemotherapy  3. Radiation to follow surgery  PLAN: Cozette is doing well today. The labs were reviewed in detail and were entirely stable. Her hgb has improved to 9.9 this week. Besides fatigue she is asymptomatic. We will continue to monitor this value. She will proceed with cycle 3 of paclitaxel today.   Adalia and I spent some time discussing her, because they bother her at night most of all, she is agreeable to trying 300mg  gabapentin QHS. She knows drowsiness is the main side effect of this drug.   Glorian will return next week for labs and cycle 4 of paclitaxel. She understands and agrees with this plan. She knows the goal of treatment in her case is cure. She has been encouraged to call with any issues that might arise before her next visit here.  Marcelino Duster, [facility] [phone] 11/02/2014 12:19 PM

## 2014-11-02 NOTE — Patient Instructions (Signed)
[facility] Discharge Instructions for Patients Receiving Chemotherapy  Today you received the following chemotherapy agents TAXOL  To help prevent nausea and vomiting after your treatment, we encourage you to take your nausea medication premeds as prescribed.   If you develop nausea and vomiting that is not controlled by your nausea medication, call the clinic.   BELOW ARE SYMPTOMS THAT SHOULD BE REPORTED IMMEDIATELY:  *FEVER GREATER THAN 100.5 F  *CHILLS WITH OR WITHOUT FEVER  NAUSEA AND VOMITING THAT IS NOT CONTROLLED WITH YOUR NAUSEA MEDICATION  *UNUSUAL SHORTNESS OF BREATH  *UNUSUAL BRUISING OR BLEEDING  TENDERNESS IN MOUTH AND THROAT WITH OR WITHOUT PRESENCE OF ULCERS  *URINARY PROBLEMS  *BOWEL PROBLEMS  UNUSUAL RASH Items with * indicate a potential emergency and should be followed up as soon as possible.  Feel free to call the clinic you have any questions or concerns. The clinic phone number is [phone].

## 2014-11-04 ENCOUNTER — Other Ambulatory Visit: Payer: Self-pay | Admitting: *Deleted

## 2014-11-04 DIAGNOSIS — C50211 Malignant neoplasm of upper-inner quadrant of right female breast: Secondary | ICD-10-CM

## 2014-11-04 MED ORDER — LORAZEPAM 0.5 MG PO TABS: 0.5000 mg | ORAL_TABLET | Freq: Three times a day (TID) | ORAL | Status: DC

## 2014-11-05 HISTORY — PX: BREAST LUMPECTOMY: SHX2

## 2014-11-09 ENCOUNTER — Ambulatory Visit (HOSPITAL_BASED_OUTPATIENT_CLINIC_OR_DEPARTMENT_OTHER): Payer: 59 | Admitting: Nurse Practitioner

## 2014-11-09 ENCOUNTER — Encounter: Payer: Self-pay | Admitting: Nurse Practitioner

## 2014-11-09 ENCOUNTER — Other Ambulatory Visit (HOSPITAL_BASED_OUTPATIENT_CLINIC_OR_DEPARTMENT_OTHER): Payer: Self-pay

## 2014-11-09 ENCOUNTER — Ambulatory Visit (HOSPITAL_BASED_OUTPATIENT_CLINIC_OR_DEPARTMENT_OTHER): Payer: 59

## 2014-11-09 ENCOUNTER — Other Ambulatory Visit: Payer: Self-pay | Admitting: Oncology

## 2014-11-09 VITALS — BP 120/68 | HR 128 | Temp 98.3°F | Resp 18 | Ht 60.0 in | Wt 148.8 lb

## 2014-11-09 DIAGNOSIS — C50211 Malignant neoplasm of upper-inner quadrant of right female breast: Secondary | ICD-10-CM

## 2014-11-09 DIAGNOSIS — Z5111 Encounter for antineoplastic chemotherapy: Secondary | ICD-10-CM

## 2014-11-09 DIAGNOSIS — E860 Dehydration: Secondary | ICD-10-CM

## 2014-11-09 DIAGNOSIS — Z171 Estrogen receptor negative status [ER-]: Secondary | ICD-10-CM

## 2014-11-09 DIAGNOSIS — R000 Tachycardia, unspecified: Secondary | ICD-10-CM

## 2014-11-09 LAB — CBC WITH DIFFERENTIAL/PLATELET
BASO%: 0.9 % (ref 0.0–2.0)
Basophils Absolute: 0 10*3/uL (ref 0.0–0.1)
EOS%: 3.7 % (ref 0.0–7.0)
Eosinophils Absolute: 0.2 10*3/uL (ref 0.0–0.5)
HEMATOCRIT: 31 % — AB (ref 34.8–46.6)
HGB: 10.2 g/dL — ABNORMAL LOW (ref 11.6–15.9)
LYMPH%: 22.7 % (ref 14.0–49.7)
MCH: 32.9 pg (ref 25.1–34.0)
MCHC: 32.9 g/dL (ref 31.5–36.0)
MCV: 100 fL (ref 79.5–101.0)
MONO#: 0.4 10*3/uL (ref 0.1–0.9)
MONO%: 9.4 % (ref 0.0–14.0)
NEUT%: 63.3 % (ref 38.4–76.8)
NEUTROS ABS: 2.9 10*3/uL (ref 1.5–6.5)
Platelets: 231 10*3/uL (ref 145–400)
RBC: 3.1 10*6/uL — ABNORMAL LOW (ref 3.70–5.45)
RDW: 16.1 % — AB (ref 11.2–14.5)
WBC: 4.6 10*3/uL (ref 3.9–10.3)
lymph#: 1 10*3/uL (ref 0.9–3.3)

## 2014-11-09 LAB — COMPREHENSIVE METABOLIC PANEL (CC13)
ALT: 19 U/L (ref 0–55)
AST: 18 U/L (ref 5–34)
Albumin: 3.1 g/dL — ABNORMAL LOW (ref 3.5–5.0)
Alkaline Phosphatase: 76 U/L (ref 40–150)
Anion Gap: 6 mEq/L (ref 3–11)
BUN: 12 mg/dL (ref 7.0–26.0)
CALCIUM: 9.1 mg/dL (ref 8.4–10.4)
CO2: 28 meq/L (ref 22–29)
Chloride: 106 mEq/L (ref 98–109)
Creatinine: 0.8 mg/dL (ref 0.6–1.1)
EGFR: 77 mL/min/{1.73_m2} — AB (ref >90)
GLUCOSE: 100 mg/dL (ref 70–140)
Potassium: 4 mEq/L (ref 3.5–5.1)
SODIUM: 140 meq/L (ref 136–145)
Total Bilirubin: 0.35 mg/dL (ref 0.20–1.20)
Total Protein: 6.2 g/dL — ABNORMAL LOW (ref 6.4–8.3)

## 2014-11-09 MED ORDER — FAMOTIDINE IN NACL 20-0.9 MG/50ML-% IV SOLN
20.0000 mg | Freq: Once | INTRAVENOUS | Status: AC
  Administered 2014-11-09: 20 mg via INTRAVENOUS

## 2014-11-09 MED ORDER — SODIUM CHLORIDE 0.9 % IV SOLN
Freq: Once | INTRAVENOUS | Status: AC
  Administered 2014-11-09: 11:00:00 via INTRAVENOUS

## 2014-11-09 MED ORDER — SODIUM CHLORIDE 0.9 % IJ SOLN
10.0000 mL | INTRAMUSCULAR | Status: DC | PRN
  Administered 2014-11-09: 10 mL
  Filled 2014-11-09: qty 10

## 2014-11-09 MED ORDER — HEPARIN SOD (PORK) LOCK FLUSH 100 UNIT/ML IV SOLN
500.0000 [IU] | Freq: Once | INTRAVENOUS | Status: AC | PRN
  Administered 2014-11-09: 500 [IU]
  Filled 2014-11-09: qty 5

## 2014-11-09 MED ORDER — PACLITAXEL CHEMO INJECTION 300 MG/50ML
80.0000 mg/m2 | Freq: Once | INTRAVENOUS | Status: AC
  Administered 2014-11-09: 132 mg via INTRAVENOUS
  Filled 2014-11-09: qty 22

## 2014-11-09 MED ORDER — ONDANSETRON 8 MG/NS 50 ML IVPB
INTRAVENOUS | Status: AC
  Filled 2014-11-09: qty 8

## 2014-11-09 MED ORDER — DEXAMETHASONE SODIUM PHOSPHATE 10 MG/ML IJ SOLN
INTRAMUSCULAR | Status: AC
  Filled 2014-11-09: qty 1

## 2014-11-09 MED ORDER — DEXAMETHASONE SODIUM PHOSPHATE 10 MG/ML IJ SOLN
4.0000 mg | Freq: Once | INTRAMUSCULAR | Status: AC
  Administered 2014-11-09: 4 mg via INTRAVENOUS

## 2014-11-09 MED ORDER — SODIUM CHLORIDE 0.9 % IV SOLN
Freq: Once | INTRAVENOUS | Status: AC
  Administered 2014-11-09: 10:00:00 via INTRAVENOUS

## 2014-11-09 MED ORDER — ONDANSETRON 8 MG/50ML IVPB (CHCC)
8.0000 mg | Freq: Once | INTRAVENOUS | Status: AC
  Administered 2014-11-09: 8 mg via INTRAVENOUS

## 2014-11-09 MED ORDER — DIPHENHYDRAMINE HCL 50 MG/ML IJ SOLN
INTRAMUSCULAR | Status: AC
Start: 2014-11-09 — End: 2014-11-09
  Filled 2014-11-09: qty 1

## 2014-11-09 MED ORDER — FAMOTIDINE IN NACL 20-0.9 MG/50ML-% IV SOLN
INTRAVENOUS | Status: AC
  Filled 2014-11-09: qty 50

## 2014-11-09 MED ORDER — DIPHENHYDRAMINE HCL 50 MG/ML IJ SOLN
25.0000 mg | Freq: Once | INTRAMUSCULAR | Status: AC
  Administered 2014-11-09: 25 mg via INTRAVENOUS

## 2014-11-09 NOTE — Progress Notes (Signed)
[place]  Telephone:(336) [phone] Fax:(336) [phone]     ID: Melissa Garza DOB: 1956/01/20  MR#: [id number]  DEY#:[id number]  Patient Care Team: No Pcp Per Patient as PCP - General (General Practice) Gus Height, MD as Consulting Physician (Obstetrics and Gynecology) Excell Seltzer, MD as Consulting Physician (General Surgery) Chauncey Cruel, MD as Consulting Physician (Oncology) Rexene Edison, MD as Consulting Physician (Radiation Oncology) OTHER MD: Chucky May MD  CHIEF COMPLAINT: Triple negative breast cancer  CURRENT TREATMENT: Neoadjuvant chemotherapy   BREAST CANCER HISTORY: From the original intake note:  [name] herself palpated a mass in her right breast approximately mid-September. She brought this to her gynecologist attention and he set her up for bilateral diagnostic mammography at the breast center 08/04/2014. The breast density was category C. in the patient has bilateral saline implants in place. In the palpable area of concern in the right breast there was an irregular mass measuring up to 2.5 cm. By palpation this was firm at the 12:30 o'clock position. Ultrasound of the right breast confirmed an irregular hypoechoic mass in this area measuring 2.5 cm. There was no right axillary lymphadenopathy noted.  Biopsy of the mass in question 08/04/2014 showed ([id number]) invasive ductal carcinoma, grade 3, triple negative, with an MIB-1 of 90%.  Bilateral breast MRI 08/09/2014 showed in the upper inner quadrant of the right breast an irregular enhancing mass measuring 2.8 cm. There was a 4 mm satellite nodule anterior to this and separated from that by 0.6 cm. The left breast, the remaining of the right breast and the lymph node areas were negative.  The patient's subsequent history is as detailed below  INTERVAL HISTORY: Melissa Garza returns today for follow up of her breast cancer.  Today is day 1, cycle 4 of 12 planned cycles of weekly  paclitaxel.  REVIEW OF SYSTEMS: Melissa Garza denies fevers or chills. She has some mild "queeziness" but this is resolved with PRN antiemetics. She is moving her bowels well. Her appetite is fair but occasionally various liquids taste bad. She has been using gabapentin nightly for her hot flashes and they are greatly reduced. As a result, she is now sleeping better. She denies mouth sores, rashes, or peripheral neuropathy. She is keeping her left great toenail trimmed because it is sensitive and is starting to come up. Her fatigue continues, but she denies shortness of breath, chest pain, cough, or palpitations. A detailed review of systems is otherwise negative.   PAST MEDICAL HISTORY: Past Medical History  Diagnosis Date  . Glaucoma   . Anxiety   . Hot flashes   . Depression   . PONV (postoperative nausea and vomiting)   . Headache     chronic  . Shortness of breath     with exertion  . GERD (gastroesophageal reflux disease)     PMH  . Cancer     cancer of right breast  . Hepatitis     Hx: of Hepatitis not sure which one    PAST SURGICAL HISTORY: Past Surgical History  Procedure Laterality Date  . Tonsilectomy/adenoidectomy with myringotomy    . Breast enhancement surgery  2008  . Eye surgery      Lasik  . Colonoscopy    . Breast surgery      Biopsy  . Dilation and curettage of uterus    . Tubal ligation    . Portacath placement N/A 08/13/2014    Procedure: INSERTION PORT-A-CATH;  Surgeon: Excell Seltzer, MD;  Location:  [facility] OR;  Service: General;  Laterality: N/A;    FAMILY HISTORY Family History  Problem Relation Age of Onset  . Psoriasis Mother   . Hypertension Father   . Asthma Other   . Thyroid disease Other    The patient's parents are currently alive, in their late 53s. The patient had no brothers, 2 sisters. There is no history of breast or ovarian cancer in the family to her knowledge.   GYNECOLOGIC HISTORY:  No LMP recorded. Patient is  postmenopausal. Menarche age 41, first live birth age 28. She is GX P2. She stopped having periods in 2005 and started hormone replacement at that time. 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 Her husband, [name], is disabled secondary to multiple cardiac problems. Son [name] lives in [place] and works in apartment maintenance. Son [name] is his wife) works as an Clinical biochemist. The patient has 6 grandchildren. She is not a Ambulance person.   ADVANCED DIRECTIVES: Not in place   HEALTH MAINTENANCE: History  Substance Use Topics  . Smoking status: Never Smoker   . Smokeless tobacco: Never Used  . Alcohol Use: No     Colonoscopy:Remote  PAP:2014  Bone density:Never  Lipid panel:  Allergies  Allergen Reactions  . Hydrocodone Hives and Itching    Current Outpatient Prescriptions  Medication Sig Dispense Refill  . acetaminophen (TYLENOL) 500 MG tablet Take 1,000 mg by mouth every 6 (six) hours as needed for mild pain.    Marland Kitchen ALPRAZolam (XANAX) 1 MG tablet Take 1 mg by mouth 4 (four) times daily as needed for anxiety.    . Armodafinil (NUVIGIL) 250 MG tablet Take 250 mg by mouth once a week.     . butalbital-acetaminophen-caffeine (FIORICET, ESGIC) 50-325-40 MG per tablet Take 1 tablet by mouth 2 (two) times daily as needed for headache.     . CLOBETASOL PROPIONATE EX Apply 1 application topically daily as needed (DRY SCALP).    . clotrimazole-betamethasone (LOTRISONE) cream Apply 1 application topically 2 (two) times daily. (Patient not taking: Reported on 10/26/2014) 30 g 0  . famotidine (PEPCID) 20 MG tablet Take 20 mg by mouth as needed.     Marland Kitchen FLUoxetine (PROZAC) 40 MG capsule Take 40 mg by mouth daily.    . fluticasone (CUTIVATE) 0.05 % cream Apply 1 application topically daily as needed (psoriasis).     . gabapentin (NEURONTIN) 300 MG  capsule Take 1 capsule (300 mg total) by mouth at bedtime. 30 capsule 2  . lidocaine-prilocaine (EMLA) cream Apply 1 application topically as needed. Apply to portacath  1 1/2 - 2 hours as needed prior to procedures. 30 g 0  . LORazepam (ATIVAN) 0.5 MG tablet Take 1 tablet (0.5 mg total) by mouth every 8 (eight) hours. 30 tablet 0  . methylphenidate 54 MG PO CR tablet Take 54 mg by mouth every morning.    . nystatin-triamcinolone (MYCOLOG II) cream Apply 1 application topically daily as needed.     . ondansetron (ZOFRAN) 8 MG tablet Take 1 tablet (8 mg total) by mouth 2 (two) times daily. Start the day after chemo for 2 days. Then take as needed for nausea or vomiting. 30 tablet 1  . Polyvinyl Alcohol-Povidone (REFRESH OP) Place 2 drops into both eyes daily as needed (ALLERGIES).    Marland Kitchen prochlorperazine (COMPAZINE) 10 MG tablet  Take 1 tablet (10 mg total) by mouth every 6 (six) hours as needed (Nausea or vomiting). 30 tablet 1  . travoprost, benzalkonium, (TRAVATAN) 0.004 % ophthalmic solution Place 1 drop into both eyes at bedtime.     . traZODone (DESYREL) 50 MG tablet Take 50 mg by mouth at bedtime as needed for sleep.    Marland Kitchen zolpidem (AMBIEN) 10 MG tablet Take 10 mg by mouth at bedtime as needed for sleep.     No current facility-administered medications for this visit.    OBJECTIVE:  Middle-aged white woman who appears stated age 32 Vitals:   11/09/14 0953  BP: 120/68  Pulse: 128  Temp: 98.3 F (36.8 C)  Resp: 18     Body mass index is 29.06 kg/(m^2).    ECOG FS:1 - Symptomatic but completely ambulatory  Skin: warm, dry  HEENT: sclerae anicteric, conjunctivae pink, oropharynx clear. No thrush or mucositis.  Lymph Nodes: No cervical or supraclavicular lymphadenopathy  Lungs: clear to auscultation bilaterally, no rales, wheezes, or rhonci  Heart: regular rate and rhythm  Abdomen: round, soft, non tender, positive bowel sounds  Musculoskeletal: No focal spinal tenderness, no  peripheral edema  Neuro: non focal, well oriented, positive affect  Breasts: deferred  LAB RESULTS:  CMP     Component Value Date/Time   NA 140 11/02/2014 1121   NA 143 06/17/2009 1137   K 3.7 11/02/2014 1121   K 4.7 06/17/2009 1137   CL 114* 06/17/2009 1137   CO2 26 11/02/2014 1121   CO2 24 06/17/2009 1137   GLUCOSE 101 11/02/2014 1121   GLUCOSE 97 06/17/2009 1137   GLUCOSE 81 10/22/2006 1253   BUN 13.0 11/02/2014 1121   BUN 16 06/17/2009 1137   CREATININE 1.1 11/02/2014 1121   CREATININE 1.2 06/17/2009 1137   CALCIUM 9.0 11/02/2014 1121   CALCIUM 9.4 06/17/2009 1137   PROT 6.2* 11/02/2014 1121   PROT 7.0 06/17/2009 1137   ALBUMIN 3.2* 11/02/2014 1121   ALBUMIN 3.9 06/17/2009 1137   AST 21 11/02/2014 1121   AST 15 06/17/2009 1137   ALT 24 11/02/2014 1121   ALT 13 06/17/2009 1137   ALKPHOS 74 11/02/2014 1121   ALKPHOS 55 06/17/2009 1137   BILITOT 0.36 11/02/2014 1121   BILITOT 0.7 06/17/2009 1137   GFRNONAA 49.82 06/17/2009 1137    I No results found for: SPEP  Lab Results  Component Value Date   WBC 4.6 11/09/2014   NEUTROABS 2.9 11/09/2014   HGB 10.2* 11/09/2014   HCT 31.0* 11/09/2014   MCV 100.0 11/09/2014   PLT 231 11/09/2014      Chemistry      Component Value Date/Time   NA 140 11/02/2014 1121   NA 143 06/17/2009 1137   K 3.7 11/02/2014 1121   K 4.7 06/17/2009 1137   CL 114* 06/17/2009 1137   CO2 26 11/02/2014 1121   CO2 24 06/17/2009 1137   BUN 13.0 11/02/2014 1121   BUN 16 06/17/2009 1137   CREATININE 1.1 11/02/2014 1121   CREATININE 1.2 06/17/2009 1137      Component Value Date/Time   CALCIUM 9.0 11/02/2014 1121   CALCIUM 9.4 06/17/2009 1137   ALKPHOS 74 11/02/2014 1121   ALKPHOS 55 06/17/2009 1137   AST 21 11/02/2014 1121   AST 15 06/17/2009 1137   ALT 24 11/02/2014 1121   ALT 13 06/17/2009 1137   BILITOT 0.36 11/02/2014 1121   BILITOT 0.7 06/17/2009 1137      No results  found for: LABCA2  No components found for:  [id number]  No results for input(s): INR in the last 168 hours.  Urinalysis No results found for: COLORURINE  STUDIES: No results found.  ASSESSMENT: 59 y.o. [place] woman status post right breast upper inner quadrant biopsy 08/04/2014 for a clinical T2 N0, stage IIA invasive ductal carcinoma, grade 3, triple negative, with an MIB-1 of 90%.  1. neoadjuvant chemotherapy with doxorubicin and cyclophosphamide dose dense x4 completed on 10/05/14; followed by paclitaxel weekly x12 started 10/19/2014  2. Surgery to follow chemotherapy  3. Radiation to follow surgery  PLAN: Deztiny continues to manage treatments well. The labs were reviewed in detail and were entirely stable. Her treatment related anemia has improved to 10.2 this week. Her heart rate was elevated to 128 today, so she will receive 564mL NS along with cycle 4 of paclitaxel today.   Melissa Garza will return next week for labs and cycle 5 of paclitaxel. She understands and agrees with this plan. She knows the goal of treatment in her case is cure. She has been encouraged to call with any issues that might arise before her next visit here.  Marcelino Duster, [place] [phone] 11/09/2014 10:05 AM

## 2014-11-09 NOTE — Patient Instructions (Signed)
[facility] Discharge Instructions for Patients Receiving Chemotherapy  Today you received the following chemotherapy agents: Taxol.  To help prevent nausea and vomiting after your treatment, we encourage you to take your nausea medication as prescribed.   If you develop nausea and vomiting that is not controlled by your nausea medication, call the clinic.   BELOW ARE SYMPTOMS THAT SHOULD BE REPORTED IMMEDIATELY:  *FEVER GREATER THAN 100.5 F  *CHILLS WITH OR WITHOUT FEVER  NAUSEA AND VOMITING THAT IS NOT CONTROLLED WITH YOUR NAUSEA MEDICATION  *UNUSUAL SHORTNESS OF BREATH  *UNUSUAL BRUISING OR BLEEDING  TENDERNESS IN MOUTH AND THROAT WITH OR WITHOUT PRESENCE OF ULCERS  *URINARY PROBLEMS  *BOWEL PROBLEMS  UNUSUAL RASH Items with * indicate a potential emergency and should be followed up as soon as possible.  Feel free to call the clinic you have any questions or concerns. The clinic phone number is [phone].    

## 2014-11-09 NOTE — Progress Notes (Signed)
Okay to treat today with HR of 128 and without CMET results per Susanne Borders, NP.  Verbal order received to give patient an additional 500 ml of NS over 1 hour during treatment time.

## 2014-11-15 ENCOUNTER — Encounter: Payer: Self-pay | Admitting: Oncology

## 2014-11-15 NOTE — Progress Notes (Signed)
Faxed clinical information to [facility] @ [phone] for patient's disability

## 2014-11-16 ENCOUNTER — Encounter: Payer: Self-pay | Admitting: Nurse Practitioner

## 2014-11-16 ENCOUNTER — Ambulatory Visit (HOSPITAL_BASED_OUTPATIENT_CLINIC_OR_DEPARTMENT_OTHER): Payer: 59 | Admitting: Nurse Practitioner

## 2014-11-16 ENCOUNTER — Other Ambulatory Visit (HOSPITAL_BASED_OUTPATIENT_CLINIC_OR_DEPARTMENT_OTHER): Payer: 59

## 2014-11-16 ENCOUNTER — Ambulatory Visit (HOSPITAL_BASED_OUTPATIENT_CLINIC_OR_DEPARTMENT_OTHER): Payer: 59

## 2014-11-16 VITALS — BP 115/65 | HR 122 | Temp 98.8°F | Resp 20 | Wt 149.3 lb

## 2014-11-16 DIAGNOSIS — Z452 Encounter for adjustment and management of vascular access device: Secondary | ICD-10-CM

## 2014-11-16 DIAGNOSIS — N649 Disorder of breast, unspecified: Secondary | ICD-10-CM

## 2014-11-16 DIAGNOSIS — Z171 Estrogen receptor negative status [ER-]: Secondary | ICD-10-CM

## 2014-11-16 DIAGNOSIS — C50211 Malignant neoplasm of upper-inner quadrant of right female breast: Secondary | ICD-10-CM

## 2014-11-16 DIAGNOSIS — R5383 Other fatigue: Secondary | ICD-10-CM

## 2014-11-16 DIAGNOSIS — R009 Unspecified abnormalities of heart beat: Secondary | ICD-10-CM

## 2014-11-16 DIAGNOSIS — L988 Other specified disorders of the skin and subcutaneous tissue: Secondary | ICD-10-CM

## 2014-11-16 DIAGNOSIS — Z5111 Encounter for antineoplastic chemotherapy: Secondary | ICD-10-CM

## 2014-11-16 LAB — CBC WITH DIFFERENTIAL/PLATELET
BASO%: 1.3 % (ref 0.0–2.0)
Basophils Absolute: 0.1 10*3/uL (ref 0.0–0.1)
EOS%: 2.4 % (ref 0.0–7.0)
Eosinophils Absolute: 0.1 10*3/uL (ref 0.0–0.5)
HEMATOCRIT: 31.6 % — AB (ref 34.8–46.6)
HEMOGLOBIN: 10.5 g/dL — AB (ref 11.6–15.9)
LYMPH#: 0.9 10*3/uL (ref 0.9–3.3)
LYMPH%: 15 % (ref 14.0–49.7)
MCH: 33.7 pg (ref 25.1–34.0)
MCHC: 33.3 g/dL (ref 31.5–36.0)
MCV: 101.3 fL — ABNORMAL HIGH (ref 79.5–101.0)
MONO#: 0.5 10*3/uL (ref 0.1–0.9)
MONO%: 7.5 % (ref 0.0–14.0)
NEUT%: 73.8 % (ref 38.4–76.8)
NEUTROS ABS: 4.4 10*3/uL (ref 1.5–6.5)
Platelets: 263 10*3/uL (ref 145–400)
RBC: 3.12 10*6/uL — ABNORMAL LOW (ref 3.70–5.45)
RDW: 17.1 % — AB (ref 11.2–14.5)
WBC: 6 10*3/uL (ref 3.9–10.3)

## 2014-11-16 LAB — COMPREHENSIVE METABOLIC PANEL (CC13)
ALBUMIN: 3.1 g/dL — AB (ref 3.5–5.0)
ALK PHOS: 84 U/L (ref 40–150)
ALT: 16 U/L (ref 0–55)
AST: 18 U/L (ref 5–34)
Anion Gap: 9 mEq/L (ref 3–11)
BUN: 13.1 mg/dL (ref 7.0–26.0)
CO2: 27 meq/L (ref 22–29)
Calcium: 8.8 mg/dL (ref 8.4–10.4)
Chloride: 103 mEq/L (ref 98–109)
Creatinine: 1 mg/dL (ref 0.6–1.1)
EGFR: 61 mL/min/{1.73_m2} — ABNORMAL LOW (ref >90)
Glucose: 94 mg/dl (ref 70–140)
Potassium: 3.7 mEq/L (ref 3.5–5.1)
SODIUM: 139 meq/L (ref 136–145)
TOTAL PROTEIN: 6.4 g/dL (ref 6.4–8.3)
Total Bilirubin: 0.34 mg/dL (ref 0.20–1.20)

## 2014-11-16 MED ORDER — ALTEPLASE 2 MG IJ SOLR
2.0000 mg | Freq: Once | INTRAMUSCULAR | Status: AC | PRN
  Administered 2014-11-16: 2 mg
  Filled 2014-11-16: qty 2

## 2014-11-16 MED ORDER — SODIUM CHLORIDE 0.9 % IV SOLN
Freq: Once | INTRAVENOUS | Status: AC
  Administered 2014-11-16: 16:00:00 via INTRAVENOUS

## 2014-11-16 MED ORDER — FAMOTIDINE IN NACL 20-0.9 MG/50ML-% IV SOLN
20.0000 mg | Freq: Once | INTRAVENOUS | Status: AC
  Administered 2014-11-16: 20 mg via INTRAVENOUS

## 2014-11-16 MED ORDER — FAMOTIDINE IN NACL 20-0.9 MG/50ML-% IV SOLN
INTRAVENOUS | Status: AC
  Filled 2014-11-16: qty 50

## 2014-11-16 MED ORDER — DIPHENHYDRAMINE HCL 50 MG/ML IJ SOLN
INTRAMUSCULAR | Status: AC
  Filled 2014-11-16: qty 1

## 2014-11-16 MED ORDER — ONDANSETRON 8 MG/NS 50 ML IVPB
INTRAVENOUS | Status: AC
  Filled 2014-11-16: qty 8

## 2014-11-16 MED ORDER — PACLITAXEL CHEMO INJECTION 300 MG/50ML
80.0000 mg/m2 | Freq: Once | INTRAVENOUS | Status: AC
  Administered 2014-11-16: 132 mg via INTRAVENOUS
  Filled 2014-11-16: qty 22

## 2014-11-16 MED ORDER — DIPHENHYDRAMINE HCL 50 MG/ML IJ SOLN
25.0000 mg | Freq: Once | INTRAMUSCULAR | Status: AC
  Administered 2014-11-16: 25 mg via INTRAVENOUS

## 2014-11-16 MED ORDER — ONDANSETRON 8 MG/50ML IVPB (CHCC)
8.0000 mg | Freq: Once | INTRAVENOUS | Status: AC
  Administered 2014-11-16: 8 mg via INTRAVENOUS

## 2014-11-16 MED ORDER — DEXAMETHASONE SODIUM PHOSPHATE 10 MG/ML IJ SOLN
4.0000 mg | Freq: Once | INTRAMUSCULAR | Status: AC
  Administered 2014-11-16: 4 mg via INTRAVENOUS

## 2014-11-16 NOTE — Progress Notes (Signed)
Blood return noted and aspirated 1726. 2.5 hours after TPA insertion.

## 2014-11-16 NOTE — Progress Notes (Signed)
Melissa Garza  Telephone:(336) [phone] Fax:(336) [phone]     ID: ELIDA HARBIN DOB: 15-Mar-1956  MR#: [id number]  IRC#:[id number]  Patient Care Team: No Pcp Per Patient as PCP - General (General Practice) Gus Height, MD as Consulting Physician (Obstetrics and Gynecology) Excell Seltzer, MD as Consulting Physician (General Surgery) Chauncey Cruel, MD as Consulting Physician (Oncology) Rexene Edison, MD as Consulting Physician (Radiation Oncology) OTHER MD: Chucky May MD  CHIEF COMPLAINT: Triple negative breast cancer CURRENT TREATMENT: Neoadjuvant chemotherapy   BREAST CANCER HISTORY: From the original intake note:  Melissa Garza herself palpated a mass in her right breast approximately mid-September. She brought this to her gynecologist attention and he set her up for bilateral diagnostic mammography at the breast center 08/04/2014. The breast density was category C. in the patient has bilateral saline implants in place. In the palpable area of concern in the right breast there was an irregular mass measuring up to 2.5 cm. By palpation this was firm at the 12:30 o'clock position. Ultrasound of the right breast confirmed an irregular hypoechoic mass in this area measuring 2.5 cm. There was no right axillary lymphadenopathy noted.  Biopsy of the mass in question 08/04/2014 showed ([id number]) invasive ductal carcinoma, grade 3, triple negative, with an MIB-1 of 90%.  Bilateral breast MRI 08/09/2014 showed in the upper inner quadrant of the right breast an irregular enhancing mass measuring 2.8 cm. There was a 4 mm satellite nodule anterior to this and separated from that by 0.6 cm. The left breast, the remaining of the right breast and the lymph node areas were negative.  The patient's subsequent history is as detailed below  INTERVAL HISTORY: Melissa Garza returns today for follow up of her breast cancer.  Today is day 1, cycle 5 of 12 planned cycles of weekly paclitaxel.  This week she shared some concern about a mole that has changed on her right breast.  REVIEW OF SYSTEMS: [name] denies fevers or chills. She has some mild "queeziness" but this is resolved with PRN antiemetics. She is moving her bowels well. Her appetite is fair but occasionally various liquids taste bad. She has been using gabapentin nightly for her hot flashes and they are greatly reduced. As a result, she is now sleeping better. She denies mouth sores, rashes, or peripheral neuropathy. She is keeping her left great toenail trimmed because it is sensitive and is starting to come up. Her fatigue continues, but she denies shortness of breath, chest pain, cough, or palpitations. A detailed review of systems is otherwise negative.   PAST MEDICAL HISTORY: Past Medical History  Diagnosis Date  . Glaucoma   . Anxiety   . Hot flashes   . Depression   . PONV (postoperative nausea and vomiting)   . Headache     chronic  . Shortness of breath     with exertion  . GERD (gastroesophageal reflux disease)     PMH  . Cancer     cancer of right breast  . Hepatitis     Hx: of Hepatitis not sure which one    PAST SURGICAL HISTORY: Past Surgical History  Procedure Laterality Date  . Tonsilectomy/adenoidectomy with myringotomy    . Breast enhancement surgery  2008  . Eye surgery      Lasik  . Colonoscopy    . Breast surgery      Biopsy  . Dilation and curettage of uterus    . Tubal ligation    . Portacath placement  N/A 08/13/2014    Procedure: INSERTION PORT-A-CATH;  Surgeon: Excell Seltzer, MD;  Location: [facility] OR;  Service: General;  Laterality: N/A;    FAMILY HISTORY Family History  Problem Relation Age of Onset  . Psoriasis Mother   . Hypertension Father   . Asthma Other   . Thyroid disease Other    The patient's parents are currently alive, in their late 76s. The patient had no brothers, 2 sisters. There is no history of breast or ovarian cancer in the family to her knowledge.    GYNECOLOGIC HISTORY:  No LMP recorded. Patient is postmenopausal. Menarche age 71, first live birth age 22. She is GX P2. She stopped having periods in 2005 and started hormone replacement at that time. 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 Her husband, [name], is disabled secondary to multiple cardiac problems. Son [name] lives in [place] and works in apartment maintenance. Son [name] is his wife) works as an Clinical biochemist. The patient has 6 grandchildren. She is not a Ambulance person.   ADVANCED DIRECTIVES: Not in place   HEALTH MAINTENANCE: History  Substance Use Topics  . Smoking status: Never Smoker   . Smokeless tobacco: Never Used  . Alcohol Use: No     Colonoscopy:Remote  PAP:2014  Bone density:Never  Lipid panel:  Allergies  Allergen Reactions  . Hydrocodone Hives and Itching    Current Outpatient Prescriptions  Medication Sig Dispense Refill  . acetaminophen (TYLENOL) 500 MG tablet Take 1,000 mg by mouth every 6 (six) hours as needed for mild pain.    Marland Kitchen ALPRAZolam (XANAX) 1 MG tablet Take 1 mg by mouth 4 (four) times daily as needed for anxiety.    . famotidine (PEPCID) 20 MG tablet Take 20 mg by mouth as needed.     Marland Kitchen FLUoxetine (PROZAC) 40 MG capsule Take 40 mg by mouth daily.    Marland Kitchen gabapentin (NEURONTIN) 300 MG capsule Take 1 capsule (300 mg total) by mouth at bedtime. 30 capsule 2  . lidocaine-prilocaine (EMLA) cream Apply 1 application topically as needed. Apply to portacath  1 1/2 - 2 hours as needed prior to procedures. 30 g 0  . LORazepam (ATIVAN) 0.5 MG tablet Take 1 tablet (0.5 mg total) by mouth every 8 (eight) hours. 30 tablet 0  . methylphenidate 54 MG PO CR tablet Take 54 mg by mouth every morning.    . ondansetron (ZOFRAN) 8 MG tablet Take 1 tablet (8 mg total) by mouth 2 (two) times daily. Start the day  after chemo for 2 days. Then take as needed for nausea or vomiting. 30 tablet 1  . Polyvinyl Alcohol-Povidone (REFRESH OP) Place 2 drops into both eyes daily as needed (ALLERGIES).    Marland Kitchen travoprost, benzalkonium, (TRAVATAN) 0.004 % ophthalmic solution Place 1 drop into both eyes at bedtime.     . traZODone (DESYREL) 50 MG tablet Take 50 mg by mouth at bedtime as needed for sleep.    Marland Kitchen zolpidem (AMBIEN) 10 MG tablet Take 10 mg by mouth at bedtime as needed for sleep.    . Armodafinil (NUVIGIL) 250 MG tablet Take 250 mg by mouth once a week.     . butalbital-acetaminophen-caffeine (FIORICET, ESGIC) 50-325-40 MG per tablet Take 1 tablet by mouth 2 (two) times daily as needed for headache.     . CLOBETASOL PROPIONATE  EX Apply 1 application topically daily as needed (DRY SCALP).    . clotrimazole-betamethasone (LOTRISONE) cream Apply 1 application topically 2 (two) times daily. (Patient not taking: Reported on 10/26/2014) 30 g 0  . fluticasone (CUTIVATE) 0.05 % cream Apply 1 application topically daily as needed (psoriasis).     . nystatin-triamcinolone (MYCOLOG II) cream Apply 1 application topically daily as needed.     . prochlorperazine (COMPAZINE) 10 MG tablet Take 1 tablet (10 mg total) by mouth every 6 (six) hours as needed (Nausea or vomiting). (Patient not taking: Reported on 11/16/2014) 30 tablet 1   No current facility-administered medications for this visit.    OBJECTIVE:  Middle-aged white woman who appears stated age 59 Vitals:   11/16/14 1321  BP: 115/65  Pulse: 122  Temp: 98.8 F (37.1 C)  Resp: 20     Body mass index is 29.15 kg/(m^2).    ECOG FS:1 - Symptomatic but completely ambulatory  Sclerae unicteric, pupils equal and reactive Oropharynx clear and moist-- no thrush No cervical or supraclavicular adenopathy Lungs no rales or rhonchi Heart regular rate and rhythm Abd soft, nontender, positive bowel sounds MSK no focal spinal tenderness, no upper extremity  lymphedema Neuro: nonfocal, well oriented, appropriate affect Breasts: right medial breast lesion, raised, crusted over and white. No other skin or nipple changes  LAB RESULTS:  CMP     Component Value Date/Time   NA 139 11/16/2014 1306   NA 143 06/17/2009 1137   K 3.7 11/16/2014 1306   K 4.7 06/17/2009 1137   CL 114* 06/17/2009 1137   CO2 27 11/16/2014 1306   CO2 24 06/17/2009 1137   GLUCOSE 94 11/16/2014 1306   GLUCOSE 97 06/17/2009 1137   GLUCOSE 81 10/22/2006 1253   BUN 13.1 11/16/2014 1306   BUN 16 06/17/2009 1137   CREATININE 1.0 11/16/2014 1306   CREATININE 1.2 06/17/2009 1137   CALCIUM 8.8 11/16/2014 1306   CALCIUM 9.4 06/17/2009 1137   PROT 6.4 11/16/2014 1306   PROT 7.0 06/17/2009 1137   ALBUMIN 3.1* 11/16/2014 1306   ALBUMIN 3.9 06/17/2009 1137   AST 18 11/16/2014 1306   AST 15 06/17/2009 1137   ALT 16 11/16/2014 1306   ALT 13 06/17/2009 1137   ALKPHOS 84 11/16/2014 1306   ALKPHOS 55 06/17/2009 1137   BILITOT 0.34 11/16/2014 1306   BILITOT 0.7 06/17/2009 1137   GFRNONAA 49.82 06/17/2009 1137    I No results found for: SPEP  Lab Results  Component Value Date   WBC 6.0 11/16/2014   NEUTROABS 4.4 11/16/2014   HGB 10.5* 11/16/2014   HCT 31.6* 11/16/2014   MCV 101.3* 11/16/2014   PLT 263 11/16/2014      Chemistry      Component Value Date/Time   NA 139 11/16/2014 1306   NA 143 06/17/2009 1137   K 3.7 11/16/2014 1306   K 4.7 06/17/2009 1137   CL 114* 06/17/2009 1137   CO2 27 11/16/2014 1306   CO2 24 06/17/2009 1137   BUN 13.1 11/16/2014 1306   BUN 16 06/17/2009 1137   CREATININE 1.0 11/16/2014 1306   CREATININE 1.2 06/17/2009 1137      Component Value Date/Time   CALCIUM 8.8 11/16/2014 1306   CALCIUM 9.4 06/17/2009 1137   ALKPHOS 84 11/16/2014 1306   ALKPHOS 55 06/17/2009 1137   AST 18 11/16/2014 1306   AST 15 06/17/2009 1137   ALT 16 11/16/2014 1306   ALT 13 06/17/2009 1137   BILITOT  0.34 11/16/2014 1306   BILITOT 0.7 06/17/2009 1137       No results found for: LABCA2  No components found for: [id number]  No results for input(s): INR in the last 168 hours.  Urinalysis No results found for: COLORURINE  STUDIES: No results found.  ASSESSMENT: 59 y.o. [place] woman status post right breast upper inner quadrant biopsy 08/04/2014 for a clinical T2 N0, stage IIA invasive ductal carcinoma, grade 3, triple negative, with an MIB-1 of 90%.  1. neoadjuvant chemotherapy with doxorubicin and cyclophosphamide dose dense x4 completed on 10/05/14; followed by paclitaxel weekly x12 started 10/19/2014 carboplatin to be added for cycles 6-12  2. Surgery to follow chemotherapy  3. Radiation to follow surgery  PLAN: Melissa Garza is doing well today. The labs were reviewed in detail and were entirely stable. She will proceed with cycle 5 of paclitaxel as scheduled. Again, her heart rate is elevated to 122, so she will receive as much of 1L NS infusing at 54ml/hr before she leaves.   I had Dr. Jana Hakim look at the breast lesion in question and he advises referring her to her dermatologist, Dr. Nevada Crane for inspection and possible removal.  As a triple negative breast cancer patient being treated neoadjuvantly, she can stand to benefit for carboplatin (AUC 2) weekly along with the paclitaxel. We will discuss adding this to her regimen starting next week. She understands and agrees with this plan. She knows the goal of treatment in her case is cure. She has been encouraged to call with any issues that might arise before her next visit here.   Melissa Garza, [place] [phone] 11/16/2014 1:43 PM

## 2014-11-16 NOTE — Patient Instructions (Signed)
[facility] Discharge Instructions for Patients Receiving Chemotherapy  Today you received the following chemotherapy agent: Taxol   To help prevent nausea and vomiting after your treatment, we encourage you to take your nausea medication as prescribed.    If you develop nausea and vomiting that is not controlled by your nausea medication, call the clinic.   BELOW ARE SYMPTOMS THAT SHOULD BE REPORTED IMMEDIATELY:  *FEVER GREATER THAN 100.5 F  *CHILLS WITH OR WITHOUT FEVER  NAUSEA AND VOMITING THAT IS NOT CONTROLLED WITH YOUR NAUSEA MEDICATION  *UNUSUAL SHORTNESS OF BREATH  *UNUSUAL BRUISING OR BLEEDING  TENDERNESS IN MOUTH AND THROAT WITH OR WITHOUT PRESENCE OF ULCERS  *URINARY PROBLEMS  *BOWEL PROBLEMS  UNUSUAL RASH Items with * indicate a potential emergency and should be followed up as soon as possible.  Feel free to call the clinic you have any questions or concerns. The clinic phone number is [phone].    

## 2014-11-16 NOTE — Progress Notes (Signed)
Per Susanne Borders, NP, patient to receive 1 liter NS at 500 cc/hr while in infusion but does not need to stay extra time to complete bag; to receive as much as possible while here for taxol.

## 2014-11-16 NOTE — Progress Notes (Signed)
Per Bryson Ha at [facility] id [id number] termed 2005. We need new card from patient to get auth for jcodes [phone]

## 2014-11-18 ENCOUNTER — Telehealth: Payer: Self-pay | Admitting: *Deleted

## 2014-11-18 NOTE — Telephone Encounter (Signed)
Per staff message and POF I have added time to appts. JMW

## 2014-11-23 ENCOUNTER — Encounter: Payer: Self-pay | Admitting: Nurse Practitioner

## 2014-11-23 ENCOUNTER — Other Ambulatory Visit: Payer: Self-pay | Admitting: Hematology and Oncology

## 2014-11-23 ENCOUNTER — Ambulatory Visit (HOSPITAL_BASED_OUTPATIENT_CLINIC_OR_DEPARTMENT_OTHER): Payer: 59

## 2014-11-23 ENCOUNTER — Ambulatory Visit (HOSPITAL_BASED_OUTPATIENT_CLINIC_OR_DEPARTMENT_OTHER): Payer: 59 | Admitting: Nurse Practitioner

## 2014-11-23 ENCOUNTER — Other Ambulatory Visit (HOSPITAL_BASED_OUTPATIENT_CLINIC_OR_DEPARTMENT_OTHER): Payer: 59

## 2014-11-23 VITALS — BP 118/67 | HR 97 | Temp 97.9°F | Resp 18 | Ht 60.0 in | Wt 149.7 lb

## 2014-11-23 DIAGNOSIS — Z171 Estrogen receptor negative status [ER-]: Secondary | ICD-10-CM

## 2014-11-23 DIAGNOSIS — Z5111 Encounter for antineoplastic chemotherapy: Secondary | ICD-10-CM

## 2014-11-23 DIAGNOSIS — C50211 Malignant neoplasm of upper-inner quadrant of right female breast: Secondary | ICD-10-CM

## 2014-11-23 LAB — CBC WITH DIFFERENTIAL/PLATELET
BASO%: 0.6 % (ref 0.0–2.0)
Basophils Absolute: 0 10*3/uL (ref 0.0–0.1)
EOS%: 3 % (ref 0.0–7.0)
Eosinophils Absolute: 0.2 10*3/uL (ref 0.0–0.5)
HCT: 30.5 % — ABNORMAL LOW (ref 34.8–46.6)
HGB: 10 g/dL — ABNORMAL LOW (ref 11.6–15.9)
LYMPH%: 15.7 % (ref 14.0–49.7)
MCH: 33 pg (ref 25.1–34.0)
MCHC: 32.8 g/dL (ref 31.5–36.0)
MCV: 100.7 fL (ref 79.5–101.0)
MONO#: 0.4 10*3/uL (ref 0.1–0.9)
MONO%: 7.8 % (ref 0.0–14.0)
NEUT%: 72.9 % (ref 38.4–76.8)
NEUTROS ABS: 3.7 10*3/uL (ref 1.5–6.5)
Platelets: 234 10*3/uL (ref 145–400)
RBC: 3.03 10*6/uL — AB (ref 3.70–5.45)
RDW: 15.2 % — ABNORMAL HIGH (ref 11.2–14.5)
WBC: 5 10*3/uL (ref 3.9–10.3)
lymph#: 0.8 10*3/uL — ABNORMAL LOW (ref 0.9–3.3)

## 2014-11-23 LAB — COMPREHENSIVE METABOLIC PANEL (CC13)
ALT: 14 U/L (ref 0–55)
AST: 18 U/L (ref 5–34)
Albumin: 3.1 g/dL — ABNORMAL LOW (ref 3.5–5.0)
Alkaline Phosphatase: 79 U/L (ref 40–150)
Anion Gap: 7 mEq/L (ref 3–11)
BILIRUBIN TOTAL: 0.29 mg/dL (ref 0.20–1.20)
BUN: 10.7 mg/dL (ref 7.0–26.0)
CALCIUM: 9 mg/dL (ref 8.4–10.4)
CHLORIDE: 106 meq/L (ref 98–109)
CO2: 26 meq/L (ref 22–29)
Creatinine: 0.8 mg/dL (ref 0.6–1.1)
EGFR: 78 mL/min/{1.73_m2} — ABNORMAL LOW (ref >90)
Glucose: 89 mg/dl (ref 70–140)
POTASSIUM: 3.8 meq/L (ref 3.5–5.1)
Sodium: 139 mEq/L (ref 136–145)
Total Protein: 6.1 g/dL — ABNORMAL LOW (ref 6.4–8.3)

## 2014-11-23 MED ORDER — DEXAMETHASONE SODIUM PHOSPHATE 10 MG/ML IJ SOLN
4.0000 mg | Freq: Once | INTRAMUSCULAR | Status: AC
  Administered 2014-11-23: 4 mg via INTRAVENOUS

## 2014-11-23 MED ORDER — FAMOTIDINE IN NACL 20-0.9 MG/50ML-% IV SOLN
20.0000 mg | Freq: Once | INTRAVENOUS | Status: AC
  Administered 2014-11-23: 20 mg via INTRAVENOUS

## 2014-11-23 MED ORDER — SODIUM CHLORIDE 0.9 % IV SOLN
Freq: Once | INTRAVENOUS | Status: AC
  Administered 2014-11-23: 11:00:00 via INTRAVENOUS

## 2014-11-23 MED ORDER — DEXAMETHASONE SODIUM PHOSPHATE 10 MG/ML IJ SOLN
INTRAMUSCULAR | Status: AC
  Filled 2014-11-23: qty 1

## 2014-11-23 MED ORDER — FAMOTIDINE IN NACL 20-0.9 MG/50ML-% IV SOLN
INTRAVENOUS | Status: AC
  Filled 2014-11-23: qty 50

## 2014-11-23 MED ORDER — ONDANSETRON 8 MG/NS 50 ML IVPB
INTRAVENOUS | Status: AC
  Filled 2014-11-23: qty 8

## 2014-11-23 MED ORDER — SODIUM CHLORIDE 0.9 % IJ SOLN
10.0000 mL | INTRAMUSCULAR | Status: DC | PRN
  Administered 2014-11-23: 10 mL
  Filled 2014-11-23: qty 10

## 2014-11-23 MED ORDER — ONDANSETRON 8 MG/50ML IVPB (CHCC)
8.0000 mg | Freq: Once | INTRAVENOUS | Status: AC
  Administered 2014-11-23: 8 mg via INTRAVENOUS

## 2014-11-23 MED ORDER — SODIUM CHLORIDE 0.9 % IV SOLN
206.8000 mg | Freq: Once | INTRAVENOUS | Status: AC
  Administered 2014-11-23: 210 mg via INTRAVENOUS
  Filled 2014-11-23: qty 21

## 2014-11-23 MED ORDER — HEPARIN SOD (PORK) LOCK FLUSH 100 UNIT/ML IV SOLN
500.0000 [IU] | Freq: Once | INTRAVENOUS | Status: AC | PRN
  Administered 2014-11-23: 500 [IU]
  Filled 2014-11-23: qty 5

## 2014-11-23 MED ORDER — DIPHENHYDRAMINE HCL 50 MG/ML IJ SOLN
INTRAMUSCULAR | Status: AC
  Filled 2014-11-23: qty 1

## 2014-11-23 MED ORDER — DEXTROSE 5 % IV SOLN
80.0000 mg/m2 | Freq: Once | INTRAVENOUS | Status: AC
  Administered 2014-11-23: 132 mg via INTRAVENOUS
  Filled 2014-11-23: qty 22

## 2014-11-23 MED ORDER — DIPHENHYDRAMINE HCL 50 MG/ML IJ SOLN
25.0000 mg | Freq: Once | INTRAMUSCULAR | Status: AC
  Administered 2014-11-23: 25 mg via INTRAVENOUS

## 2014-11-23 NOTE — Patient Instructions (Signed)
[facility] Discharge Instructions for Patients Receiving Chemotherapy  Today you received the following chemotherapy agents: Taxol and Carboplatin  To help prevent nausea and vomiting after your treatment, we encourage you to take your nausea medication:Compazine 10 mg every 6 hours as needed. Zofran 8 mg every 12 hours as needed.   If you develop nausea and vomiting that is not controlled by your nausea medication, call the clinic.   BELOW ARE SYMPTOMS THAT SHOULD BE REPORTED IMMEDIATELY:  *FEVER GREATER THAN 100.5 F  *CHILLS WITH OR WITHOUT FEVER  NAUSEA AND VOMITING THAT IS NOT CONTROLLED WITH YOUR NAUSEA MEDICATION  *UNUSUAL SHORTNESS OF BREATH  *UNUSUAL BRUISING OR BLEEDING  TENDERNESS IN MOUTH AND THROAT WITH OR WITHOUT PRESENCE OF ULCERS  *URINARY PROBLEMS  *BOWEL PROBLEMS  UNUSUAL RASH Items with * indicate a potential emergency and should be followed up as soon as possible.  Feel free to call the clinic you have any questions or concerns. The clinic phone number is [phone].  Carboplatin injection What is this medicine? CARBOPLATIN (KAR boe pla tin) is a chemotherapy drug. It targets fast dividing cells, like cancer cells, and causes these cells to die. This medicine is used to treat ovarian cancer and many other cancers. This medicine may be used for other purposes; ask your health care provider or pharmacist if you have questions. COMMON BRAND NAME(S): Paraplatin What should I tell my health care provider before I take this medicine? They need to know if you have any of these conditions: -blood disorders -hearing problems -kidney disease -recent or ongoing radiation therapy -an unusual or allergic reaction to carboplatin, cisplatin, other chemotherapy, other medicines, foods, dyes, or preservatives -pregnant or trying to get pregnant -breast-feeding How should I use this medicine? This drug is usually given as an infusion into a vein. It  is administered in a hospital or clinic by a specially trained health care professional. Talk to your pediatrician regarding the use of this medicine in children. Special care may be needed. Overdosage: If you think you have taken too much of this medicine contact a poison control center or emergency room at once. NOTE: This medicine is only for you. Do not share this medicine with others. What if I miss a dose? It is important not to miss a dose. Call your doctor or health care professional if you are unable to keep an appointment. What may interact with this medicine? -medicines for seizures -medicines to increase blood counts like filgrastim, pegfilgrastim, sargramostim -some antibiotics like amikacin, gentamicin, neomycin, streptomycin, tobramycin -vaccines Talk to your doctor or health care professional before taking any of these medicines: -acetaminophen -aspirin -ibuprofen -ketoprofen -naproxen This list may not describe all possible interactions. Give your health care provider a list of all the medicines, herbs, non-prescription drugs, or dietary supplements you use. Also tell them if you smoke, drink alcohol, or use illegal drugs. Some items may interact with your medicine. What should I watch for while using this medicine? Your condition will be monitored carefully while you are receiving this medicine. You will need important blood work done while you are taking this medicine. This drug may make you feel generally unwell. This is not uncommon, as chemotherapy can affect healthy cells as well as cancer cells. Report any side effects. Continue your course of treatment even though you feel ill unless your doctor tells you to stop. In some cases, you may be given additional medicines to help with side effects. Follow all directions for  their use. Call your doctor or health care professional for advice if you get a fever, chills or sore throat, or other symptoms of a cold or flu. Do not  treat yourself. This drug decreases your body's ability to fight infections. Try to avoid being around people who are sick. This medicine may increase your risk to bruise or bleed. Call your doctor or health care professional if you notice any unusual bleeding. Be careful brushing and flossing your teeth or using a toothpick because you may get an infection or bleed more easily. If you have any dental work done, tell your dentist you are receiving this medicine. Avoid taking products that contain aspirin, acetaminophen, ibuprofen, naproxen, or ketoprofen unless instructed by your doctor. These medicines may hide a fever. Do not become pregnant while taking this medicine. Women should inform their doctor if they wish to become pregnant or think they might be pregnant. There is a potential for serious side effects to an unborn child. Talk to your health care professional or pharmacist for more information. Do not breast-feed an infant while taking this medicine. What side effects may I notice from receiving this medicine? Side effects that you should report to your doctor or health care professional as soon as possible: -allergic reactions like skin rash, itching or hives, swelling of the face, lips, or tongue -signs of infection - fever or chills, cough, sore throat, pain or difficulty passing urine -signs of decreased platelets or bleeding - bruising, pinpoint red spots on the skin, black, tarry stools, nosebleeds -signs of decreased red blood cells - unusually weak or tired, fainting spells, lightheadedness -breathing problems -changes in hearing -changes in vision -chest pain -high blood pressure -low blood counts - This drug may decrease the number of white blood cells, red blood cells and platelets. You may be at increased risk for infections and bleeding. -nausea and vomiting -pain, swelling, redness or irritation at the injection site -pain, tingling, numbness in the hands or feet -problems  with balance, talking, walking -trouble passing urine or change in the amount of urine Side effects that usually do not require medical attention (report to your doctor or health care professional if they continue or are bothersome): -hair loss -loss of appetite -metallic taste in the mouth or changes in taste This list may not describe all possible side effects. Call your doctor for medical advice about side effects. You may report side effects to FDA at 1-800-FDA-1088. Where should I keep my medicine? This drug is given in a hospital or clinic and will not be stored at home. NOTE: This sheet is a summary. It may not cover all possible information. If you have questions about this medicine, talk to your doctor, pharmacist, or health care provider.  2015, Elsevier/Gold Standard. (2008-01-27 14:38:05)

## 2014-11-23 NOTE — Progress Notes (Signed)
[place]  [phone] [phone]     ID: Melissa Garza DOB: December 15, 1955  MR#: [id number]  SNK#:[id number]  Patient Care Team: No Pcp Per Patient as PCP - General (General Practice) Gus Height, MD as Consulting Physician (Obstetrics and Gynecology) Excell Seltzer, MD as Consulting Physician (General Surgery) Chauncey Cruel, MD as Consulting Physician (Oncology) Rexene Edison, MD as Consulting Physician (Radiation Oncology) OTHER MD: Chucky May MD  CHIEF COMPLAINT: Triple negative breast cancer CURRENT TREATMENT: Neoadjuvant chemotherapy   BREAST CANCER HISTORY: From the original intake note:  Melissa Garza herself palpated a mass in her right breast approximately mid-September. She brought this to her gynecologist attention and he set her up for bilateral diagnostic mammography at the breast center 08/04/2014. The breast density was category C. in the patient has bilateral saline implants in place. In the palpable area of concern in the right breast there was an irregular mass measuring up to 2.5 cm. By palpation this was firm at the 12:30 o'clock position. Ultrasound of the right breast confirmed an irregular hypoechoic mass in this area measuring 2.5 cm. There was no right axillary lymphadenopathy noted.  Biopsy of the mass in question 08/04/2014 showed ([id number]) invasive ductal carcinoma, grade 3, triple negative, with an MIB-1 of 90%.  Bilateral breast MRI 08/09/2014 showed in the upper inner quadrant of the right breast an irregular enhancing mass measuring 2.8 cm. There was a 4 mm satellite nodule anterior to this and separated from that by 0.6 cm. The left breast, the remaining of the right breast and the lymph node areas were negative.  The patient's subsequent history is as detailed below  INTERVAL HISTORY: Melissa Garza returns today for follow up of her breast cancer.  Today is day 1, cycle 6 of 12 planned cycles of weekly paclitaxel.  Carboplatin will be added for weeks 6-12 as the patient is triple negative.   REVIEW OF SYSTEMS: Melissa Garza is greatly improved this week. She denies fevers or chills. Her nausea is managed with PRN antiemetics. She continues to move her bowels well. Her appetite is fair but she has greatly improved her water intake by adding cucumber and other fruits to her water bottle. She denies mouth sores, rashes, or peripheral neuropathy symptoms. She had enough energy to "window shop" this past week, which would have been a feat before. She denies shortness of breath, chest pain, cough, or palpitations. She had 2 headaches this past week that resolved with tylenol use. She is sleeping well. A detailed review of systems is otherwise negative.   PAST MEDICAL HISTORY: Past Medical History  Diagnosis Date  . Glaucoma   . Anxiety   . Hot flashes   . Depression   . PONV (postoperative nausea and vomiting)   . Headache     chronic  . Shortness of breath     with exertion  . GERD (gastroesophageal reflux disease)     PMH  . Cancer     cancer of right breast  . Hepatitis     Hx: of Hepatitis not sure which one    PAST SURGICAL HISTORY: Past Surgical History  Procedure Laterality Date  . Tonsilectomy/adenoidectomy with myringotomy    . Breast enhancement surgery  2008  . Eye surgery      Lasik  . Colonoscopy    . Breast surgery      Biopsy  . Dilation and curettage of uterus    . Tubal ligation    . Portacath  placement N/A 08/13/2014    Procedure: INSERTION PORT-A-CATH;  Surgeon: Excell Seltzer, MD;  Location: [facility] OR;  Service: General;  Laterality: N/A;    FAMILY HISTORY Family History  Problem Relation Age of Onset  . Psoriasis Mother   . Hypertension Father   . Asthma Other   . Thyroid disease Other    The patient's parents are currently alive, in their late 7s. The patient had no brothers, 2 sisters. There is no history of breast or ovarian cancer in the family to her knowledge.    GYNECOLOGIC HISTORY:  No LMP recorded. Patient is postmenopausal. Menarche age 28, first live birth age 89. She is GX P2. She stopped having periods in 2005 and started hormone replacement at that time. 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 Her husband, [name], is disabled secondary to multiple cardiac problems. Son [name] lives in [place] and works in apartment maintenance. Son [name] is his wife) works as an Clinical biochemist. The patient has 6 grandchildren. She is not a Ambulance person.   ADVANCED DIRECTIVES: Not in place   HEALTH MAINTENANCE: History  Substance Use Topics  . Smoking status: Never Smoker   . Smokeless tobacco: Never Used  . Alcohol Use: No     Colonoscopy:Remote  PAP:2014  Bone density:Never  Lipid panel:  Allergies  Allergen Reactions  . Hydrocodone Hives and Itching    Current Outpatient Prescriptions  Medication Sig Dispense Refill  . acetaminophen (TYLENOL) 500 MG tablet Take 1,000 mg by mouth every 6 (six) hours as needed for mild pain.    Marland Kitchen ALPRAZolam (XANAX) 1 MG tablet Take 1 mg by mouth 4 (four) times daily as needed for anxiety.    Marland Kitchen CLOBETASOL PROPIONATE EX Apply 1 application topically daily as needed (DRY SCALP).    . famotidine (PEPCID) 20 MG tablet Take 20 mg by mouth as needed.     Marland Kitchen FLUoxetine (PROZAC) 40 MG capsule Take 40 mg by mouth daily.    . fluticasone (CUTIVATE) 0.05 % cream Apply 1 application topically daily as needed (psoriasis).     . gabapentin (NEURONTIN) 300 MG capsule Take 1 capsule (300 mg total) by mouth at bedtime. 30 capsule 2  . lidocaine-prilocaine (EMLA) cream Apply 1 application topically as needed. Apply to portacath  1 1/2 - 2 hours as needed prior to procedures. 30 g 0  . LORazepam (ATIVAN) 0.5 MG tablet Take 1 tablet (0.5 mg total) by mouth every 8 (eight) hours. 30  tablet 0  . methylphenidate 54 MG PO CR tablet Take 54 mg by mouth every morning.    . ondansetron (ZOFRAN) 8 MG tablet Take 1 tablet (8 mg total) by mouth 2 (two) times daily. Start the day after chemo for 2 days. Then take as needed for nausea or vomiting. 30 tablet 1  . Polyvinyl Alcohol-Povidone (REFRESH OP) Place 2 drops into both eyes daily as needed (ALLERGIES).    Marland Kitchen travoprost, benzalkonium, (TRAVATAN) 0.004 % ophthalmic solution Place 1 drop into both eyes at bedtime.     . traZODone (DESYREL) 50 MG tablet Take 50 mg by mouth at bedtime as needed for sleep.    Marland Kitchen zolpidem (AMBIEN) 10 MG tablet Take 10 mg by mouth at bedtime as needed for sleep.    . Armodafinil (NUVIGIL) 250 MG tablet Take 250 mg by mouth  once a week.     . butalbital-acetaminophen-caffeine (FIORICET, ESGIC) 50-325-40 MG per tablet Take 1 tablet by mouth 2 (two) times daily as needed for headache.     . clotrimazole-betamethasone (LOTRISONE) cream Apply 1 application topically 2 (two) times daily. (Patient not taking: Reported on 10/26/2014) 30 g 0  . nystatin-triamcinolone (MYCOLOG II) cream Apply 1 application topically daily as needed.     . prochlorperazine (COMPAZINE) 10 MG tablet Take 1 tablet (10 mg total) by mouth every 6 (six) hours as needed (Nausea or vomiting). (Patient not taking: Reported on 11/16/2014) 30 tablet 1   No current facility-administered medications for this visit.    OBJECTIVE:  Middle-aged white woman who appears stated age 59 Vitals:   11/23/14 0922  BP: 118/67  Pulse: 97  Temp: 97.9 F (36.6 C)  Resp: 18     Body mass index is 29.24 kg/(m^2).    ECOG FS:1 - Symptomatic but completely ambulatory  Skin: warm, dry  HEENT: sclerae anicteric, conjunctivae pink, oropharynx clear. No thrush or mucositis.  Lymph Nodes: No cervical or supraclavicular lymphadenopathy  Lungs: clear to auscultation bilaterally, no rales, wheezes, or rhonci  Heart: regular rate and rhythm  Abdomen: round,  soft, non tender, positive bowel sounds  Musculoskeletal: No focal spinal tenderness, no peripheral edema  Neuro: non focal, well oriented, positive affect  Breasts: deferred  LAB RESULTS:  CMP     Component Value Date/Time   NA 139 11/16/2014 1306   NA 143 06/17/2009 1137   K 3.7 11/16/2014 1306   K 4.7 06/17/2009 1137   CL 114* 06/17/2009 1137   CO2 27 11/16/2014 1306   CO2 24 06/17/2009 1137   GLUCOSE 94 11/16/2014 1306   GLUCOSE 97 06/17/2009 1137   GLUCOSE 81 10/22/2006 1253   BUN 13.1 11/16/2014 1306   BUN 16 06/17/2009 1137   CREATININE 1.0 11/16/2014 1306   CREATININE 1.2 06/17/2009 1137   CALCIUM 8.8 11/16/2014 1306   CALCIUM 9.4 06/17/2009 1137   PROT 6.4 11/16/2014 1306   PROT 7.0 06/17/2009 1137   ALBUMIN 3.1* 11/16/2014 1306   ALBUMIN 3.9 06/17/2009 1137   AST 18 11/16/2014 1306   AST 15 06/17/2009 1137   ALT 16 11/16/2014 1306   ALT 13 06/17/2009 1137   ALKPHOS 84 11/16/2014 1306   ALKPHOS 55 06/17/2009 1137   BILITOT 0.34 11/16/2014 1306   BILITOT 0.7 06/17/2009 1137   GFRNONAA 49.82 06/17/2009 1137    I No results found for: SPEP  Lab Results  Component Value Date   WBC 5.0 11/23/2014   NEUTROABS 3.7 11/23/2014   HGB 10.0* 11/23/2014   HCT 30.5* 11/23/2014   MCV 100.7 11/23/2014   PLT 234 11/23/2014      Chemistry      Component Value Date/Time   NA 139 11/16/2014 1306   NA 143 06/17/2009 1137   K 3.7 11/16/2014 1306   K 4.7 06/17/2009 1137   CL 114* 06/17/2009 1137   CO2 27 11/16/2014 1306   CO2 24 06/17/2009 1137   BUN 13.1 11/16/2014 1306   BUN 16 06/17/2009 1137   CREATININE 1.0 11/16/2014 1306   CREATININE 1.2 06/17/2009 1137      Component Value Date/Time   CALCIUM 8.8 11/16/2014 1306   CALCIUM 9.4 06/17/2009 1137   ALKPHOS 84 11/16/2014 1306   ALKPHOS 55 06/17/2009 1137   AST 18 11/16/2014 1306   AST 15 06/17/2009 1137   ALT 16 11/16/2014 1306   ALT  13 06/17/2009 1137   BILITOT 0.34 11/16/2014 1306   BILITOT 0.7  06/17/2009 1137      No results found for: LABCA2  No components found for: LABCA125  No results for input(s): INR in the last 168 hours.  Urinalysis No results found for: COLORURINE  STUDIES: No results found.  ASSESSMENT: 59 y.o. [place] woman status post right breast upper inner quadrant biopsy 08/04/2014 for a clinical T2 N0, stage IIA invasive ductal carcinoma, grade 3, triple negative, with an MIB-1 of 90%.  1. neoadjuvant chemotherapy with doxorubicin and cyclophosphamide dose dense x4 completed on 10/05/14; followed by paclitaxel weekly x12 started 10/19/2014. Carboplatin added for cycles 6-12.  2. Surgery to follow chemotherapy  3. Radiation to follow surgery  PLAN: Lilyian looks and feels well today. The labs were reviewed in detail and were entirely stable. She will proceed with cycle 6 of paclitaxel. Carboplatin will be added for the duration of her weekly chemotherapy treatments. Kahley and I discussed the potential toxicities and side effects of this drug and she agrees to treatment.   Damani will return next week for labs, a follow up visit, and her next round of chemo. She understands and agrees with this plan. She knows the goal of treatment in her case is cure. She has been encouraged to call with any issues that might arise before her next visit here.   Marcelino Duster, [place] [phone] 11/23/2014 9:52 AM

## 2014-11-29 ENCOUNTER — Telehealth: Payer: Self-pay | Admitting: Nurse Practitioner

## 2014-11-29 NOTE — Telephone Encounter (Signed)
, °

## 2014-11-30 ENCOUNTER — Ambulatory Visit (HOSPITAL_BASED_OUTPATIENT_CLINIC_OR_DEPARTMENT_OTHER): Payer: 59 | Admitting: Nurse Practitioner

## 2014-11-30 ENCOUNTER — Other Ambulatory Visit: Payer: Self-pay | Admitting: *Deleted

## 2014-11-30 ENCOUNTER — Other Ambulatory Visit (HOSPITAL_BASED_OUTPATIENT_CLINIC_OR_DEPARTMENT_OTHER): Payer: 59

## 2014-11-30 ENCOUNTER — Encounter: Payer: Self-pay | Admitting: Nurse Practitioner

## 2014-11-30 ENCOUNTER — Ambulatory Visit (HOSPITAL_BASED_OUTPATIENT_CLINIC_OR_DEPARTMENT_OTHER): Payer: 59

## 2014-11-30 VITALS — BP 125/84 | HR 102 | Temp 97.7°F | Resp 20 | Ht 60.0 in | Wt 148.5 lb

## 2014-11-30 DIAGNOSIS — Z171 Estrogen receptor negative status [ER-]: Secondary | ICD-10-CM

## 2014-11-30 DIAGNOSIS — Z5111 Encounter for antineoplastic chemotherapy: Secondary | ICD-10-CM

## 2014-11-30 DIAGNOSIS — C50211 Malignant neoplasm of upper-inner quadrant of right female breast: Secondary | ICD-10-CM

## 2014-11-30 LAB — CBC WITH DIFFERENTIAL/PLATELET
BASO%: 0.6 % (ref 0.0–2.0)
Basophils Absolute: 0 10*3/uL (ref 0.0–0.1)
EOS ABS: 0.1 10*3/uL (ref 0.0–0.5)
EOS%: 2.6 % (ref 0.0–7.0)
HCT: 31.8 % — ABNORMAL LOW (ref 34.8–46.6)
HEMOGLOBIN: 10.6 g/dL — AB (ref 11.6–15.9)
LYMPH%: 27.9 % (ref 14.0–49.7)
MCH: 33.4 pg (ref 25.1–34.0)
MCHC: 33.3 g/dL (ref 31.5–36.0)
MCV: 100.3 fL (ref 79.5–101.0)
MONO#: 0.3 10*3/uL (ref 0.1–0.9)
MONO%: 8.1 % (ref 0.0–14.0)
NEUT%: 60.8 % (ref 38.4–76.8)
NEUTROS ABS: 2.1 10*3/uL (ref 1.5–6.5)
Platelets: 205 10*3/uL (ref 145–400)
RBC: 3.17 10*6/uL — ABNORMAL LOW (ref 3.70–5.45)
RDW: 14.5 % (ref 11.2–14.5)
WBC: 3.4 10*3/uL — ABNORMAL LOW (ref 3.9–10.3)
lymph#: 1 10*3/uL (ref 0.9–3.3)

## 2014-11-30 LAB — COMPREHENSIVE METABOLIC PANEL (CC13)
ALBUMIN: 3.3 g/dL — AB (ref 3.5–5.0)
ALK PHOS: 80 U/L (ref 40–150)
ALT: 24 U/L (ref 0–55)
ANION GAP: 8 meq/L (ref 3–11)
AST: 18 U/L (ref 5–34)
BILIRUBIN TOTAL: 0.38 mg/dL (ref 0.20–1.20)
BUN: 14.7 mg/dL (ref 7.0–26.0)
CHLORIDE: 105 meq/L (ref 98–109)
CO2: 25 mEq/L (ref 22–29)
Calcium: 9 mg/dL (ref 8.4–10.4)
Creatinine: 0.9 mg/dL (ref 0.6–1.1)
EGFR: 75 mL/min/{1.73_m2} — ABNORMAL LOW (ref >90)
GLUCOSE: 112 mg/dL (ref 70–140)
POTASSIUM: 3.9 meq/L (ref 3.5–5.1)
Sodium: 138 mEq/L (ref 136–145)
TOTAL PROTEIN: 6.2 g/dL — AB (ref 6.4–8.3)

## 2014-11-30 MED ORDER — GABAPENTIN 300 MG PO CAPS: 300.0000 mg | ORAL_CAPSULE | Freq: Every day | ORAL | Status: DC

## 2014-11-30 MED ORDER — DEXAMETHASONE SODIUM PHOSPHATE 10 MG/ML IJ SOLN
4.0000 mg | Freq: Once | INTRAMUSCULAR | Status: AC
  Administered 2014-11-30: 4 mg via INTRAVENOUS

## 2014-11-30 MED ORDER — ONDANSETRON 8 MG/NS 50 ML IVPB
INTRAVENOUS | Status: AC
  Filled 2014-11-30: qty 8

## 2014-11-30 MED ORDER — DIPHENHYDRAMINE HCL 50 MG/ML IJ SOLN
25.0000 mg | Freq: Once | INTRAMUSCULAR | Status: AC
  Administered 2014-11-30: 25 mg via INTRAVENOUS

## 2014-11-30 MED ORDER — PACLITAXEL CHEMO INJECTION 300 MG/50ML
80.0000 mg/m2 | Freq: Once | INTRAVENOUS | Status: AC
  Administered 2014-11-30: 132 mg via INTRAVENOUS
  Filled 2014-11-30: qty 22

## 2014-11-30 MED ORDER — HEPARIN SOD (PORK) LOCK FLUSH 100 UNIT/ML IV SOLN
500.0000 [IU] | Freq: Once | INTRAVENOUS | Status: AC | PRN
  Administered 2014-11-30: 500 [IU]
  Filled 2014-11-30: qty 5

## 2014-11-30 MED ORDER — DEXAMETHASONE SODIUM PHOSPHATE 10 MG/ML IJ SOLN
INTRAMUSCULAR | Status: AC
  Filled 2014-11-30: qty 1

## 2014-11-30 MED ORDER — DIPHENHYDRAMINE HCL 50 MG/ML IJ SOLN
INTRAMUSCULAR | Status: AC
  Filled 2014-11-30: qty 1

## 2014-11-30 MED ORDER — FAMOTIDINE IN NACL 20-0.9 MG/50ML-% IV SOLN
INTRAVENOUS | Status: AC
  Filled 2014-11-30: qty 50

## 2014-11-30 MED ORDER — SODIUM CHLORIDE 0.9 % IJ SOLN
10.0000 mL | INTRAMUSCULAR | Status: DC | PRN
  Administered 2014-11-30: 10 mL
  Filled 2014-11-30: qty 10

## 2014-11-30 MED ORDER — SODIUM CHLORIDE 0.9 % IV SOLN
Freq: Once | INTRAVENOUS | Status: AC
  Administered 2014-11-30: 10:00:00 via INTRAVENOUS

## 2014-11-30 MED ORDER — FAMOTIDINE IN NACL 20-0.9 MG/50ML-% IV SOLN
20.0000 mg | Freq: Once | INTRAVENOUS | Status: AC
  Administered 2014-11-30: 20 mg via INTRAVENOUS

## 2014-11-30 MED ORDER — ONDANSETRON 8 MG/50ML IVPB (CHCC)
8.0000 mg | Freq: Once | INTRAVENOUS | Status: AC
  Administered 2014-11-30: 8 mg via INTRAVENOUS

## 2014-11-30 MED ORDER — CARBOPLATIN CHEMO INJECTION 450 MG/45ML
189.4000 mg | Freq: Once | INTRAVENOUS | Status: AC
  Administered 2014-11-30: 190 mg via INTRAVENOUS
  Filled 2014-11-30: qty 19

## 2014-11-30 NOTE — Patient Instructions (Signed)
[facility] Discharge Instructions for Patients Receiving Chemotherapy  Today you received the following chemotherapy agents: Carboplatin and Taxol.  To help prevent nausea and vomiting after your treatment, we encourage you to take your nausea medication as directed  If you develop nausea and vomiting that is not controlled by your nausea medication, call the clinic.   BELOW ARE SYMPTOMS THAT SHOULD BE REPORTED IMMEDIATELY:  *FEVER GREATER THAN 100.5 F  *CHILLS WITH OR WITHOUT FEVER  NAUSEA AND VOMITING THAT IS NOT CONTROLLED WITH YOUR NAUSEA MEDICATION  *UNUSUAL SHORTNESS OF BREATH  *UNUSUAL BRUISING OR BLEEDING  TENDERNESS IN MOUTH AND THROAT WITH OR WITHOUT PRESENCE OF ULCERS  *URINARY PROBLEMS  *BOWEL PROBLEMS  UNUSUAL RASH Items with * indicate a potential emergency and should be followed up as soon as possible.  Feel free to call the clinic you have any questions or concerns. The clinic phone number is [phone].  

## 2014-11-30 NOTE — Progress Notes (Signed)
[place]  Telephone:(336) [phone] Fax:(336) [phone]     ID: [name] DOB: 1956/03/08  MR#: [id number]  EPP#:[id number]  Patient Care Team: No Pcp Per Patient as PCP - General (General Practice) Gus Height, MD as Consulting Physician (Obstetrics and Gynecology) Excell Seltzer, MD as Consulting Physician (General Surgery) Chauncey Cruel, MD as Consulting Physician (Oncology) Rexene Edison, MD as Consulting Physician (Radiation Oncology) OTHER MD: Chucky May MD  CHIEF COMPLAINT: Triple negative breast cancer CURRENT TREATMENT: Neoadjuvant chemotherapy  BREAST CANCER HISTORY: From the original intake note:  Melissa Garza herself palpated a mass in her right breast approximately mid-September. She brought this to her gynecologist attention and he set her up for bilateral diagnostic mammography at the breast center 08/04/2014. The breast density was category C. in the patient has bilateral saline implants in place. In the palpable area of concern in the right breast there was an irregular mass measuring up to 2.5 cm. By palpation this was firm at the 12:30 o'clock position. Ultrasound of the right breast confirmed an irregular hypoechoic mass in this area measuring 2.5 cm. There was no right axillary lymphadenopathy noted.  Biopsy of the mass in question 08/04/2014 showed ([id number]) invasive ductal carcinoma, grade 3, triple negative, with an MIB-1 of 90%.  Bilateral breast MRI 08/09/2014 showed in the upper inner quadrant of the right breast an irregular enhancing mass measuring 2.8 cm. There was a 4 mm satellite nodule anterior to this and separated from that by 0.6 cm. The left breast, the remaining of the right breast and the lymph node areas were negative.  The patient's subsequent history is as detailed below  INTERVAL HISTORY: Melissa Garza returns today for follow up of her breast cancer.  Today is day 1, cycle 7 of 12 planned cycles of weekly paclitaxel  with carboplatin added weekly for cycles 6-12.   REVIEW OF SYSTEMS: Melissa Garza denies fevers or chills. She had some abdominal discomfort but would not call it nausea. She takes zofran PRN. She is moving her bowels well with no help. She is eating and drinking well. She denies mouth sores or rashes. She has some fleeting numbness starting yesterday to her left hand and left foot. This is fairly "silent" today. She denies shortness of breath, chest pain, cough, or palpitations. She slept excessively tues-fri of last week. She is using gabapentin nightly for her hot flashes and needs a refill. She had 2 headaches that came and went last week without tylenol. A detailed review of systems is otherwise negative.   PAST MEDICAL HISTORY: Past Medical History  Diagnosis Date  . Glaucoma   . Anxiety   . Hot flashes   . Depression   . PONV (postoperative nausea and vomiting)   . Headache     chronic  . Shortness of breath     with exertion  . GERD (gastroesophageal reflux disease)     PMH  . Cancer     cancer of right breast  . Hepatitis     Hx: of Hepatitis not sure which one    PAST SURGICAL HISTORY: Past Surgical History  Procedure Laterality Date  . Tonsilectomy/adenoidectomy with myringotomy    . Breast enhancement surgery  2008  . Eye surgery      Lasik  . Colonoscopy    . Breast surgery      Biopsy  . Dilation and curettage of uterus    . Tubal ligation    . Portacath placement N/A 08/13/2014  Procedure: INSERTION PORT-A-CATH;  Surgeon: Excell Seltzer, MD;  Location: [facility] OR;  Service: General;  Laterality: N/A;    FAMILY HISTORY Family History  Problem Relation Age of Onset  . Psoriasis Mother   . Hypertension Father   . Asthma Other   . Thyroid disease Other    The patient's parents are currently alive, in their late 35s. The patient had no brothers, 2 sisters. There is no history of breast or ovarian cancer in the family to her knowledge.   GYNECOLOGIC HISTORY:  No  LMP recorded. Patient is postmenopausal. Menarche age 32, first live birth age 89. She is GX P2. She stopped having periods in 2005 and started hormone replacement at that time. 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 Her husband, Melissa Garza, is disabled secondary to multiple cardiac problems. Son Melissa Garza lives in [place] and works in apartment maintenance. Son Melissa Garza Melissa Garza is his wife) works as an Clinical biochemist. The patient has 6 grandchildren. She is not a Ambulance person.   ADVANCED DIRECTIVES: Not in place   HEALTH MAINTENANCE: History  Substance Use Topics  . Smoking status: Never Smoker   . Smokeless tobacco: Never Used  . Alcohol Use: No     Colonoscopy:Remote  PAP:2014  Bone density:Never  Lipid panel:  Allergies  Allergen Reactions  . Hydrocodone Hives and Itching    Current Outpatient Prescriptions  Medication Sig Dispense Refill  . ALPRAZolam (XANAX) 1 MG tablet Take 1 mg by mouth 4 (four) times daily as needed for anxiety.    . famotidine (PEPCID) 20 MG tablet Take 20 mg by mouth as needed.     Marland Kitchen FLUoxetine (PROZAC) 40 MG capsule Take 40 mg by mouth daily.    Marland Kitchen lidocaine-prilocaine (EMLA) cream Apply 1 application topically as needed. Apply to portacath  1 1/2 - 2 hours as needed prior to procedures. 30 g 0  . LORazepam (ATIVAN) 0.5 MG tablet Take 1 tablet (0.5 mg total) by mouth every 8 (eight) hours. 30 tablet 0  . methylphenidate 54 MG PO CR tablet Take 54 mg by mouth every morning.    . ondansetron (ZOFRAN) 8 MG tablet Take 1 tablet (8 mg total) by mouth 2 (two) times daily. Start the day after chemo for 2 days. Then take as needed for nausea or vomiting. 30 tablet 1  . Polyvinyl Alcohol-Povidone (REFRESH OP) Place 2 drops into both eyes daily as needed (ALLERGIES).    Marland Kitchen travoprost, benzalkonium, (TRAVATAN) 0.004 % ophthalmic  solution Place 1 drop into both eyes at bedtime.     . traZODone (DESYREL) 50 MG tablet Take 50 mg by mouth at bedtime as needed for sleep.    Marland Kitchen zolpidem (AMBIEN) 10 MG tablet Take 10 mg by mouth at bedtime as needed for sleep.    Marland Kitchen acetaminophen (TYLENOL) 500 MG tablet Take 1,000 mg by mouth every 6 (six) hours as needed for mild pain.    . Armodafinil (NUVIGIL) 250 MG tablet Take 250 mg by mouth once a week.     . butalbital-acetaminophen-caffeine (FIORICET, ESGIC) 50-325-40 MG per tablet Take 1 tablet by mouth 2 (two) times daily as needed for headache.     . CLOBETASOL PROPIONATE EX Apply 1 application topically daily as needed (DRY SCALP).    . clotrimazole-betamethasone (LOTRISONE) cream Apply 1 application topically 2 (two) times daily. (  Patient not taking: Reported on 10/26/2014) 30 g 0  . fluticasone (CUTIVATE) 0.05 % cream Apply 1 application topically daily as needed (psoriasis).     . gabapentin (NEURONTIN) 300 MG capsule Take 1 capsule (300 mg total) by mouth at bedtime. 30 capsule 2  . nystatin-triamcinolone (MYCOLOG II) cream Apply 1 application topically daily as needed.     . prochlorperazine (COMPAZINE) 10 MG tablet Take 1 tablet (10 mg total) by mouth every 6 (six) hours as needed (Nausea or vomiting). (Patient not taking: Reported on 11/16/2014) 30 tablet 1   No current facility-administered medications for this visit.    OBJECTIVE:  Middle-aged white woman who appears stated age 59 Vitals:   11/30/14 0940  BP:   Pulse: 102  Temp:   Resp:      Body mass index is 29 kg/(m^2).    ECOG FS:1 - Symptomatic but completely ambulatory  Sclerae unicteric, pupils equal and reactive Oropharynx clear and moist-- no thrush No cervical or supraclavicular adenopathy Lungs no rales or rhonchi Heart regular rate and rhythm Abd soft, nontender, positive bowel sounds MSK no focal spinal tenderness, no upper extremity lymphedema Neuro: nonfocal, well oriented, appropriate  affect Breasts: deferred  LAB RESULTS:  CMP     Component Value Date/Time   NA 138 11/30/2014 0914   NA 143 06/17/2009 1137   K 3.9 11/30/2014 0914   K 4.7 06/17/2009 1137   CL 114* 06/17/2009 1137   CO2 25 11/30/2014 0914   CO2 24 06/17/2009 1137   GLUCOSE 112 11/30/2014 0914   GLUCOSE 97 06/17/2009 1137   GLUCOSE 81 10/22/2006 1253   BUN 14.7 11/30/2014 0914   BUN 16 06/17/2009 1137   CREATININE 0.9 11/30/2014 0914   CREATININE 1.2 06/17/2009 1137   CALCIUM 9.0 11/30/2014 0914   CALCIUM 9.4 06/17/2009 1137   PROT 6.2* 11/30/2014 0914   PROT 7.0 06/17/2009 1137   ALBUMIN 3.3* 11/30/2014 0914   ALBUMIN 3.9 06/17/2009 1137   AST 18 11/30/2014 0914   AST 15 06/17/2009 1137   ALT 24 11/30/2014 0914   ALT 13 06/17/2009 1137   ALKPHOS 80 11/30/2014 0914   ALKPHOS 55 06/17/2009 1137   BILITOT 0.38 11/30/2014 0914   BILITOT 0.7 06/17/2009 1137   GFRNONAA 49.82 06/17/2009 1137    I No results found for: SPEP  Lab Results  Component Value Date   WBC 3.4* 11/30/2014   NEUTROABS 2.1 11/30/2014   HGB 10.6* 11/30/2014   HCT 31.8* 11/30/2014   MCV 100.3 11/30/2014   PLT 205 11/30/2014      Chemistry      Component Value Date/Time   NA 138 11/30/2014 0914   NA 143 06/17/2009 1137   K 3.9 11/30/2014 0914   K 4.7 06/17/2009 1137   CL 114* 06/17/2009 1137   CO2 25 11/30/2014 0914   CO2 24 06/17/2009 1137   BUN 14.7 11/30/2014 0914   BUN 16 06/17/2009 1137   CREATININE 0.9 11/30/2014 0914   CREATININE 1.2 06/17/2009 1137      Component Value Date/Time   CALCIUM 9.0 11/30/2014 0914   CALCIUM 9.4 06/17/2009 1137   ALKPHOS 80 11/30/2014 0914   ALKPHOS 55 06/17/2009 1137   AST 18 11/30/2014 0914   AST 15 06/17/2009 1137   ALT 24 11/30/2014 0914   ALT 13 06/17/2009 1137   BILITOT 0.38 11/30/2014 0914   BILITOT 0.7 06/17/2009 1137      No results found for: LABCA2  No components  found for: QDIYM415  No results for input(s): INR in the last 168  hours.  Urinalysis No results found for: COLORURINE  STUDIES: No results found.  ASSESSMENT: 59 y.o. [place] woman status post right breast upper inner quadrant biopsy 08/04/2014 for a clinical T2 N0, stage IIA invasive ductal carcinoma, grade 3, triple negative, with an MIB-1 of 90%.  1. neoadjuvant chemotherapy with doxorubicin and cyclophosphamide dose dense x4 completed on 10/05/14; followed by paclitaxel weekly x12 started 10/19/2014. Carboplatin added for cycles 6-12.  2. Surgery to follow chemotherapy  3. Radiation to follow surgery  PLAN: Jacilyn is doing well today. The labs were reviewed in detail and were entirely stable. We spent some time discussing peripheral neuropathy symptoms and the fact that persistent symptoms can become permanent. At this time she states the sensation was fleeting. We will proceed with cycle 7 of paclitaxel and carboplatin. She know that if the numbness returns next week, we may have to make adjustments to her treatment.   Nashiya will return next week for labs and a follow up visit. She understands and agrees with this plan. She knows the goal of treatment in her case is cure. She has been encouraged to call with any issues that might arise before her next visit here.  Marcelino Duster, [place] [phone] 11/30/2014 10:08 AM

## 2014-12-06 ENCOUNTER — Other Ambulatory Visit: Payer: Self-pay | Admitting: *Deleted

## 2014-12-06 DIAGNOSIS — C50211 Malignant neoplasm of upper-inner quadrant of right female breast: Secondary | ICD-10-CM

## 2014-12-07 ENCOUNTER — Ambulatory Visit (HOSPITAL_BASED_OUTPATIENT_CLINIC_OR_DEPARTMENT_OTHER): Payer: 59 | Admitting: Nurse Practitioner

## 2014-12-07 ENCOUNTER — Other Ambulatory Visit (HOSPITAL_BASED_OUTPATIENT_CLINIC_OR_DEPARTMENT_OTHER): Payer: 59

## 2014-12-07 ENCOUNTER — Ambulatory Visit (HOSPITAL_BASED_OUTPATIENT_CLINIC_OR_DEPARTMENT_OTHER): Payer: 59

## 2014-12-07 ENCOUNTER — Encounter: Payer: Self-pay | Admitting: Nurse Practitioner

## 2014-12-07 VITALS — BP 121/75 | HR 100 | Temp 98.4°F | Resp 18 | Ht 60.0 in | Wt 154.7 lb

## 2014-12-07 DIAGNOSIS — Z5111 Encounter for antineoplastic chemotherapy: Secondary | ICD-10-CM

## 2014-12-07 DIAGNOSIS — Z171 Estrogen receptor negative status [ER-]: Secondary | ICD-10-CM

## 2014-12-07 DIAGNOSIS — C50211 Malignant neoplasm of upper-inner quadrant of right female breast: Secondary | ICD-10-CM

## 2014-12-07 DIAGNOSIS — T451X5A Adverse effect of antineoplastic and immunosuppressive drugs, initial encounter: Secondary | ICD-10-CM

## 2014-12-07 DIAGNOSIS — G629 Polyneuropathy, unspecified: Secondary | ICD-10-CM

## 2014-12-07 DIAGNOSIS — G620 Drug-induced polyneuropathy: Secondary | ICD-10-CM | POA: Insufficient documentation

## 2014-12-07 LAB — CBC WITH DIFFERENTIAL/PLATELET
BASO%: 1.3 % (ref 0.0–2.0)
Basophils Absolute: 0 10*3/uL (ref 0.0–0.1)
EOS%: 2.7 % (ref 0.0–7.0)
Eosinophils Absolute: 0.1 10*3/uL (ref 0.0–0.5)
HCT: 29.7 % — ABNORMAL LOW (ref 34.8–46.6)
HEMOGLOBIN: 9.6 g/dL — AB (ref 11.6–15.9)
LYMPH%: 27.8 % (ref 14.0–49.7)
MCH: 33.1 pg (ref 25.1–34.0)
MCHC: 32.4 g/dL (ref 31.5–36.0)
MCV: 102.3 fL — AB (ref 79.5–101.0)
MONO#: 0.3 10*3/uL (ref 0.1–0.9)
MONO%: 9.8 % (ref 0.0–14.0)
NEUT#: 1.8 10*3/uL (ref 1.5–6.5)
NEUT%: 58.4 % (ref 38.4–76.8)
Platelets: 204 10*3/uL (ref 145–400)
RBC: 2.9 10*6/uL — ABNORMAL LOW (ref 3.70–5.45)
RDW: 15.7 % — AB (ref 11.2–14.5)
WBC: 3.1 10*3/uL — ABNORMAL LOW (ref 3.9–10.3)
lymph#: 0.8 10*3/uL — ABNORMAL LOW (ref 0.9–3.3)

## 2014-12-07 LAB — COMPREHENSIVE METABOLIC PANEL (CC13)
ALT: 28 U/L (ref 0–55)
ANION GAP: 6 meq/L (ref 3–11)
AST: 23 U/L (ref 5–34)
Albumin: 3 g/dL — ABNORMAL LOW (ref 3.5–5.0)
Alkaline Phosphatase: 76 U/L (ref 40–150)
BUN: 12.5 mg/dL (ref 7.0–26.0)
CALCIUM: 8.5 mg/dL (ref 8.4–10.4)
CO2: 26 meq/L (ref 22–29)
Chloride: 108 mEq/L (ref 98–109)
Creatinine: 0.8 mg/dL (ref 0.6–1.1)
EGFR: 82 mL/min/{1.73_m2} — ABNORMAL LOW (ref >90)
GLUCOSE: 96 mg/dL (ref 70–140)
POTASSIUM: 4.5 meq/L (ref 3.5–5.1)
Sodium: 141 mEq/L (ref 136–145)
Total Bilirubin: 0.23 mg/dL (ref 0.20–1.20)
Total Protein: 5.6 g/dL — ABNORMAL LOW (ref 6.4–8.3)

## 2014-12-07 MED ORDER — DEXAMETHASONE SODIUM PHOSPHATE 10 MG/ML IJ SOLN
INTRAMUSCULAR | Status: AC
  Filled 2014-12-07: qty 1

## 2014-12-07 MED ORDER — ONDANSETRON 8 MG/50ML IVPB (CHCC)
8.0000 mg | Freq: Once | INTRAVENOUS | Status: AC
  Administered 2014-12-07: 8 mg via INTRAVENOUS

## 2014-12-07 MED ORDER — SODIUM CHLORIDE 0.9 % IV SOLN
206.8000 mg | Freq: Once | INTRAVENOUS | Status: AC
  Administered 2014-12-07: 210 mg via INTRAVENOUS
  Filled 2014-12-07: qty 21

## 2014-12-07 MED ORDER — DEXAMETHASONE SODIUM PHOSPHATE 10 MG/ML IJ SOLN
4.0000 mg | Freq: Once | INTRAMUSCULAR | Status: AC
  Administered 2014-12-07: 4 mg via INTRAVENOUS

## 2014-12-07 MED ORDER — DIPHENHYDRAMINE HCL 50 MG/ML IJ SOLN
25.0000 mg | Freq: Once | INTRAMUSCULAR | Status: AC
  Administered 2014-12-07: 25 mg via INTRAVENOUS

## 2014-12-07 MED ORDER — PACLITAXEL CHEMO INJECTION 300 MG/50ML
80.0000 mg/m2 | Freq: Once | INTRAVENOUS | Status: AC
  Administered 2014-12-07: 132 mg via INTRAVENOUS
  Filled 2014-12-07: qty 22

## 2014-12-07 MED ORDER — ONDANSETRON 8 MG/NS 50 ML IVPB
INTRAVENOUS | Status: AC
  Filled 2014-12-07: qty 8

## 2014-12-07 MED ORDER — FAMOTIDINE IN NACL 20-0.9 MG/50ML-% IV SOLN
INTRAVENOUS | Status: AC
  Filled 2014-12-07: qty 50

## 2014-12-07 MED ORDER — FAMOTIDINE IN NACL 20-0.9 MG/50ML-% IV SOLN
20.0000 mg | Freq: Once | INTRAVENOUS | Status: AC
  Administered 2014-12-07: 20 mg via INTRAVENOUS

## 2014-12-07 MED ORDER — SODIUM CHLORIDE 0.9 % IV SOLN
Freq: Once | INTRAVENOUS | Status: AC
  Administered 2014-12-07: 12:00:00 via INTRAVENOUS

## 2014-12-07 MED ORDER — DIPHENHYDRAMINE HCL 50 MG/ML IJ SOLN
INTRAMUSCULAR | Status: AC
  Filled 2014-12-07: qty 1

## 2014-12-07 MED ORDER — HEPARIN SOD (PORK) LOCK FLUSH 100 UNIT/ML IV SOLN
500.0000 [IU] | Freq: Once | INTRAVENOUS | Status: AC | PRN
  Administered 2014-12-07: 500 [IU]
  Filled 2014-12-07: qty 5

## 2014-12-07 MED ORDER — SODIUM CHLORIDE 0.9 % IJ SOLN
10.0000 mL | INTRAMUSCULAR | Status: DC | PRN
  Administered 2014-12-07: 10 mL
  Filled 2014-12-07: qty 10

## 2014-12-07 NOTE — Progress Notes (Signed)
[place]  Telephone:(336) [phone] Fax:(336) [phone]     ID: DEBORAHANN Garza DOB: 27-Oct-1956  MR#: [id number]  OAC#:[id number]  Patient Care Team: No Pcp Per Patient as PCP - General (General Practice) Gus Height, MD as Consulting Physician (Obstetrics and Gynecology) Excell Seltzer, MD as Consulting Physician (General Surgery) Chauncey Cruel, MD as Consulting Physician (Oncology) Rexene Edison, MD as Consulting Physician (Radiation Oncology) OTHER MD: Chucky May MD  CHIEF COMPLAINT: Triple negative breast cancer CURRENT TREATMENT: Neoadjuvant chemotherapy  BREAST CANCER HISTORY: From the original intake note:  Melissa Garza herself palpated a mass in her right breast approximately mid-September. She brought this to her gynecologist attention and he set her up for bilateral diagnostic mammography at the breast center 08/04/2014. The breast density was category C. in the patient has bilateral saline implants in place. In the palpable area of concern in the right breast there was an irregular mass measuring up to 2.5 cm. By palpation this was firm at the 12:30 o'clock position. Ultrasound of the right breast confirmed an irregular hypoechoic mass in this area measuring 2.5 cm. There was no right axillary lymphadenopathy noted.  Biopsy of the mass in question 08/04/2014 showed ([id number]) invasive ductal carcinoma, grade 3, triple negative, with an MIB-1 of 90%.  Bilateral breast MRI 08/09/2014 showed in the upper inner quadrant of the right breast an irregular enhancing mass measuring 2.8 cm. There was a 4 mm satellite nodule anterior to this and separated from that by 0.6 cm. The left breast, the remaining of the right breast and the lymph node areas were negative.  The patient's subsequent history is as detailed below  INTERVAL HISTORY: Melissa Garza returns today for follow up of her breast cancer. Today is day 1, cycle 8 of 12 planned cycles of weekly paclitaxel  with carboplatin added weekly for cycles 6-12.   REVIEW OF SYSTEMS: Falyn denies fevers, chills, nausea or vomiting. She had constipation for 1-2 days after chemo but it resolved on its own. She is eating and drinking well. She denies mouth sores or rashes. She still has some fleeting numbness to her left hand, but it comes and goes and is most present immediately after treatment. She denies shortness of breath, chest pain, cough, or palpitations. Her fatigue increases week by week. She is using gabapentin nightly for her hot flashes.  A detailed review of systems is otherwise negative.   PAST MEDICAL HISTORY: Past Medical History  Diagnosis Date  . Glaucoma   . Anxiety   . Hot flashes   . Depression   . PONV (postoperative nausea and vomiting)   . Headache     chronic  . Shortness of breath     with exertion  . GERD (gastroesophageal reflux disease)     PMH  . Cancer     cancer of right breast  . Hepatitis     Hx: of Hepatitis not sure which one    PAST SURGICAL HISTORY: Past Surgical History  Procedure Laterality Date  . Tonsilectomy/adenoidectomy with myringotomy    . Breast enhancement surgery  2008  . Eye surgery      Lasik  . Colonoscopy    . Breast surgery      Biopsy  . Dilation and curettage of uterus    . Tubal ligation    . Portacath placement N/A 08/13/2014    Procedure: INSERTION PORT-A-CATH;  Surgeon: Excell Seltzer, MD;  Location: [facility];  Service: General;  Laterality: N/A;  FAMILY HISTORY Family History  Problem Relation Age of Onset  . Psoriasis Mother   . Hypertension Father   . Asthma Other   . Thyroid disease Other    The patient's parents are currently alive, in their late 60s. The patient had no brothers, 2 sisters. There is no history of breast or ovarian cancer in the family to her knowledge.   GYNECOLOGIC HISTORY:  No LMP recorded. Patient is postmenopausal. Menarche age 30, first live birth age 65. She is GX P2. She stopped having  periods in 2005 and started hormone replacement at that time. 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 Her husband, Melissa Garza, is disabled secondary to multiple cardiac problems. Son Melissa Garza lives in [place] and works in apartment maintenance. Son Melissa Garza is his wife) works as an Clinical biochemist. The patient has 6 grandchildren. She is not a Ambulance person.   ADVANCED DIRECTIVES: Not in place   HEALTH MAINTENANCE: History  Substance Use Topics  . Smoking status: Never Smoker   . Smokeless tobacco: Never Used  . Alcohol Use: No     Colonoscopy:Remote  PAP:2014  Bone density:Never  Lipid panel:  Allergies  Allergen Reactions  . Hydrocodone Hives and Itching    Current Outpatient Prescriptions  Medication Sig Dispense Refill  . acetaminophen (TYLENOL) 500 MG tablet Take 1,000 mg by mouth every 6 (six) hours as needed for mild pain.    Marland Kitchen ALPRAZolam (XANAX) 1 MG tablet Take 1 mg by mouth 4 (four) times daily as needed for anxiety.    . famotidine (PEPCID) 20 MG tablet Take 20 mg by mouth as needed.     Marland Kitchen FLUoxetine (PROZAC) 40 MG capsule Take 40 mg by mouth daily.    Marland Kitchen gabapentin (NEURONTIN) 300 MG capsule Take 1 capsule (300 mg total) by mouth at bedtime. 30 capsule 2  . lidocaine-prilocaine (EMLA) cream Apply 1 application topically as needed. Apply to portacath  1 1/2 - 2 hours as needed prior to procedures. 30 g 0  . LORazepam (ATIVAN) 0.5 MG tablet Take 1 tablet (0.5 mg total) by mouth every 8 (eight) hours. 30 tablet 0  . methylphenidate 54 MG PO CR tablet Take 54 mg by mouth every morning.    . ondansetron (ZOFRAN) 8 MG tablet Take 1 tablet (8 mg total) by mouth 2 (two) times daily. Start the day after chemo for 2 days. Then take as needed for nausea or vomiting. 30 tablet 1  . Polyvinyl Alcohol-Povidone (REFRESH OP) Place 2 drops into both  eyes daily as needed (ALLERGIES).    Marland Kitchen travoprost, benzalkonium, (TRAVATAN) 0.004 % ophthalmic solution Place 1 drop into both eyes at bedtime.     . traZODone (DESYREL) 50 MG tablet Take 50 mg by mouth at bedtime as needed for sleep.    Marland Kitchen zolpidem (AMBIEN) 10 MG tablet Take 10 mg by mouth at bedtime as needed for sleep.    . Armodafinil (NUVIGIL) 250 MG tablet Take 250 mg by mouth once a week.     . butalbital-acetaminophen-caffeine (FIORICET, ESGIC) 50-325-40 MG per tablet Take 1 tablet by mouth 2 (two) times daily as needed for headache.     . CLOBETASOL PROPIONATE EX Apply 1 application topically daily as needed (DRY SCALP).    . clotrimazole-betamethasone (LOTRISONE) cream Apply 1 application topically 2 (two) times daily. (Patient  not taking: Reported on 10/26/2014) 30 g 0  . fluticasone (CUTIVATE) 0.05 % cream Apply 1 application topically daily as needed (psoriasis).     . nystatin-triamcinolone (MYCOLOG II) cream Apply 1 application topically daily as needed.     . prochlorperazine (COMPAZINE) 10 MG tablet Take 1 tablet (10 mg total) by mouth every 6 (six) hours as needed (Nausea or vomiting). (Patient not taking: Reported on 11/16/2014) 30 tablet 1   No current facility-administered medications for this visit.   Facility-Administered Medications Ordered in Other Visits  Medication Dose Route Frequency Provider Last Rate Last Dose  . sodium chloride 0.9 % injection 10 mL  10 mL Intracatheter PRN Chauncey Cruel, MD   10 mL at 11/30/14 1339    OBJECTIVE:  Middle-aged white woman who appears stated age 4 Vitals:   12/07/14 1106  BP: 121/75  Pulse: 100  Temp: 98.4 F (36.9 C)  Resp: 18     Body mass index is 30.21 kg/(m^2).    ECOG FS:1 - Symptomatic but completely ambulatory  Skin: warm, dry  HEENT: sclerae anicteric, conjunctivae pink, oropharynx clear. No thrush or mucositis.  Lymph Nodes: No cervical or supraclavicular lymphadenopathy  Lungs: clear to auscultation  bilaterally, no rales, wheezes, or rhonci  Heart: regular rate and rhythm  Abdomen: round, soft, non tender, positive bowel sounds  Musculoskeletal: No focal spinal tenderness, no peripheral edema  Neuro: non focal, well oriented, positive affect  Breasts: deferred  LAB RESULTS:  CMP     Component Value Date/Time   NA 138 11/30/2014 0914   NA 143 06/17/2009 1137   K 3.9 11/30/2014 0914   K 4.7 06/17/2009 1137   CL 114* 06/17/2009 1137   CO2 25 11/30/2014 0914   CO2 24 06/17/2009 1137   GLUCOSE 112 11/30/2014 0914   GLUCOSE 97 06/17/2009 1137   GLUCOSE 81 10/22/2006 1253   BUN 14.7 11/30/2014 0914   BUN 16 06/17/2009 1137   CREATININE 0.9 11/30/2014 0914   CREATININE 1.2 06/17/2009 1137   CALCIUM 9.0 11/30/2014 0914   CALCIUM 9.4 06/17/2009 1137   PROT 6.2* 11/30/2014 0914   PROT 7.0 06/17/2009 1137   ALBUMIN 3.3* 11/30/2014 0914   ALBUMIN 3.9 06/17/2009 1137   AST 18 11/30/2014 0914   AST 15 06/17/2009 1137   ALT 24 11/30/2014 0914   ALT 13 06/17/2009 1137   ALKPHOS 80 11/30/2014 0914   ALKPHOS 55 06/17/2009 1137   BILITOT 0.38 11/30/2014 0914   BILITOT 0.7 06/17/2009 1137   GFRNONAA 49.82 06/17/2009 1137    I No results found for: SPEP  Lab Results  Component Value Date   WBC 3.1* 12/07/2014   NEUTROABS 1.8 12/07/2014   HGB 9.6* 12/07/2014   HCT 29.7* 12/07/2014   MCV 102.3* 12/07/2014   PLT 204 12/07/2014      Chemistry      Component Value Date/Time   NA 138 11/30/2014 0914   NA 143 06/17/2009 1137   K 3.9 11/30/2014 0914   K 4.7 06/17/2009 1137   CL 114* 06/17/2009 1137   CO2 25 11/30/2014 0914   CO2 24 06/17/2009 1137   BUN 14.7 11/30/2014 0914   BUN 16 06/17/2009 1137   CREATININE 0.9 11/30/2014 0914   CREATININE 1.2 06/17/2009 1137      Component Value Date/Time   CALCIUM 9.0 11/30/2014 0914   CALCIUM 9.4 06/17/2009 1137   ALKPHOS 80 11/30/2014 0914   ALKPHOS 55 06/17/2009 1137   AST 18 11/30/2014  0914   AST 15 06/17/2009 1137   ALT  24 11/30/2014 0914   ALT 13 06/17/2009 1137   BILITOT 0.38 11/30/2014 0914   BILITOT 0.7 06/17/2009 1137      No results found for: LABCA2  No components found for: LABCA125  No results for input(s): INR in the last 168 hours.  Urinalysis No results found for: COLORURINE  STUDIES: No results found.  ASSESSMENT: 59 y.o. [place] woman status post right breast upper inner quadrant biopsy 08/04/2014 for a clinical T2 N0, stage IIA invasive ductal carcinoma, grade 3, triple negative, with an MIB-1 of 90%.  1. neoadjuvant chemotherapy with doxorubicin and cyclophosphamide dose dense x4 completed on 10/05/14; followed by paclitaxel weekly x12 started 10/19/2014. Carboplatin added for cycles 6-12.  2. Surgery to follow chemotherapy  3. Radiation to follow surgery  PLAN: Amyjo continues to do well with treatment. The labs were reviewed in detail and were entirely stable. She will continue with cycle 8 of paclitaxel and carboplatin. We discussed her neuropathy symptoms and they are light and intermittent at this time. If they escalate or become constant we will make adjustments to her treatment.   Jeris will return next week for labs, a follow up visit, and cycle 9 of treatment. She understands and agrees with this plan. She knows the goal of treatment in her case is cure. She has been encouraged to call with any issues that might arise before her next visit here.   Marcelino Duster, [place] [phone] 12/07/2014 11:43 AM

## 2014-12-07 NOTE — Patient Instructions (Signed)
[facility] Discharge Instructions for Patients Receiving Chemotherapy  Today you received the following chemotherapy agents taxol/carboplatin  To help prevent nausea and vomiting after your treatment, we encourage you to take your nausea medication as directed   If you develop nausea and vomiting that is not controlled by your nausea medication, call the clinic.   BELOW ARE SYMPTOMS THAT SHOULD BE REPORTED IMMEDIATELY:  *FEVER GREATER THAN 100.5 F  *CHILLS WITH OR WITHOUT FEVER  NAUSEA AND VOMITING THAT IS NOT CONTROLLED WITH YOUR NAUSEA MEDICATION  *UNUSUAL SHORTNESS OF BREATH  *UNUSUAL BRUISING OR BLEEDING  TENDERNESS IN MOUTH AND THROAT WITH OR WITHOUT PRESENCE OF ULCERS  *URINARY PROBLEMS  *BOWEL PROBLEMS  UNUSUAL RASH Items with * indicate a potential emergency and should be followed up as soon as possible.  Feel free to call the clinic you have any questions or concerns. The clinic phone number is [phone].  

## 2014-12-08 ENCOUNTER — Encounter: Payer: Self-pay | Admitting: Oncology

## 2014-12-08 NOTE — Progress Notes (Signed)
Pt is approved for the $600 [employer].

## 2014-12-13 ENCOUNTER — Other Ambulatory Visit: Payer: Self-pay | Admitting: *Deleted

## 2014-12-13 DIAGNOSIS — C50211 Malignant neoplasm of upper-inner quadrant of right female breast: Secondary | ICD-10-CM

## 2014-12-14 ENCOUNTER — Ambulatory Visit (HOSPITAL_BASED_OUTPATIENT_CLINIC_OR_DEPARTMENT_OTHER): Payer: 59 | Admitting: Nurse Practitioner

## 2014-12-14 ENCOUNTER — Encounter: Payer: Self-pay | Admitting: Oncology

## 2014-12-14 ENCOUNTER — Encounter: Payer: Self-pay | Admitting: Nurse Practitioner

## 2014-12-14 ENCOUNTER — Other Ambulatory Visit: Payer: Self-pay | Admitting: Oncology

## 2014-12-14 ENCOUNTER — Ambulatory Visit (HOSPITAL_BASED_OUTPATIENT_CLINIC_OR_DEPARTMENT_OTHER): Payer: 59

## 2014-12-14 ENCOUNTER — Other Ambulatory Visit (HOSPITAL_BASED_OUTPATIENT_CLINIC_OR_DEPARTMENT_OTHER): Payer: 59

## 2014-12-14 VITALS — BP 115/86 | HR 105 | Temp 98.5°F | Resp 18 | Ht 60.0 in | Wt 154.4 lb

## 2014-12-14 DIAGNOSIS — Z171 Estrogen receptor negative status [ER-]: Secondary | ICD-10-CM

## 2014-12-14 DIAGNOSIS — C50211 Malignant neoplasm of upper-inner quadrant of right female breast: Secondary | ICD-10-CM

## 2014-12-14 DIAGNOSIS — Z5111 Encounter for antineoplastic chemotherapy: Secondary | ICD-10-CM

## 2014-12-14 LAB — COMPREHENSIVE METABOLIC PANEL (CC13)
ALBUMIN: 3.2 g/dL — AB (ref 3.5–5.0)
ALK PHOS: 81 U/L (ref 40–150)
ALT: 44 U/L (ref 0–55)
AST: 29 U/L (ref 5–34)
Anion Gap: 6 mEq/L (ref 3–11)
BUN: 8.2 mg/dL (ref 7.0–26.0)
CO2: 26 mEq/L (ref 22–29)
Calcium: 8.8 mg/dL (ref 8.4–10.4)
Chloride: 107 mEq/L (ref 98–109)
Creatinine: 0.8 mg/dL (ref 0.6–1.1)
EGFR: 83 mL/min/{1.73_m2} — ABNORMAL LOW (ref >90)
Glucose: 94 mg/dl (ref 70–140)
POTASSIUM: 4.2 meq/L (ref 3.5–5.1)
SODIUM: 140 meq/L (ref 136–145)
TOTAL PROTEIN: 5.9 g/dL — AB (ref 6.4–8.3)
Total Bilirubin: 0.25 mg/dL (ref 0.20–1.20)

## 2014-12-14 LAB — CBC WITH DIFFERENTIAL/PLATELET
BASO%: 1.3 % (ref 0.0–2.0)
BASOS ABS: 0 10*3/uL (ref 0.0–0.1)
EOS%: 1.3 % (ref 0.0–7.0)
Eosinophils Absolute: 0 10*3/uL (ref 0.0–0.5)
HEMATOCRIT: 31.8 % — AB (ref 34.8–46.6)
HEMOGLOBIN: 10.5 g/dL — AB (ref 11.6–15.9)
LYMPH%: 26.3 % (ref 14.0–49.7)
MCH: 33.8 pg (ref 25.1–34.0)
MCHC: 33 g/dL (ref 31.5–36.0)
MCV: 102.3 fL — ABNORMAL HIGH (ref 79.5–101.0)
MONO#: 0.3 10*3/uL (ref 0.1–0.9)
MONO%: 9 % (ref 0.0–14.0)
NEUT#: 1.9 10*3/uL (ref 1.5–6.5)
NEUT%: 62.1 % (ref 38.4–76.8)
Platelets: 176 10*3/uL (ref 145–400)
RBC: 3.11 10*6/uL — ABNORMAL LOW (ref 3.70–5.45)
RDW: 14.6 % — ABNORMAL HIGH (ref 11.2–14.5)
WBC: 3.1 10*3/uL — AB (ref 3.9–10.3)
lymph#: 0.8 10*3/uL — ABNORMAL LOW (ref 0.9–3.3)

## 2014-12-14 MED ORDER — DIPHENHYDRAMINE HCL 50 MG/ML IJ SOLN
INTRAMUSCULAR | Status: AC
  Filled 2014-12-14: qty 1

## 2014-12-14 MED ORDER — SODIUM CHLORIDE 0.9 % IJ SOLN
10.0000 mL | INTRAMUSCULAR | Status: DC | PRN
  Administered 2014-12-14: 10 mL
  Filled 2014-12-14: qty 10

## 2014-12-14 MED ORDER — DEXAMETHASONE SODIUM PHOSPHATE 10 MG/ML IJ SOLN
4.0000 mg | Freq: Once | INTRAMUSCULAR | Status: AC
  Administered 2014-12-14: 4 mg via INTRAVENOUS

## 2014-12-14 MED ORDER — PACLITAXEL CHEMO INJECTION 300 MG/50ML
80.0000 mg/m2 | Freq: Once | INTRAVENOUS | Status: AC
  Administered 2014-12-14: 132 mg via INTRAVENOUS
  Filled 2014-12-14: qty 22

## 2014-12-14 MED ORDER — ONDANSETRON 8 MG/NS 50 ML IVPB
INTRAVENOUS | Status: AC
  Filled 2014-12-14: qty 8

## 2014-12-14 MED ORDER — DIPHENHYDRAMINE HCL 50 MG/ML IJ SOLN
25.0000 mg | Freq: Once | INTRAMUSCULAR | Status: AC
  Administered 2014-12-14: 25 mg via INTRAVENOUS

## 2014-12-14 MED ORDER — FAMOTIDINE IN NACL 20-0.9 MG/50ML-% IV SOLN
20.0000 mg | Freq: Once | INTRAVENOUS | Status: AC
  Administered 2014-12-14: 20 mg via INTRAVENOUS

## 2014-12-14 MED ORDER — SODIUM CHLORIDE 0.9 % IV SOLN
Freq: Once | INTRAVENOUS | Status: AC
  Administered 2014-12-14: 10:00:00 via INTRAVENOUS

## 2014-12-14 MED ORDER — HEPARIN SOD (PORK) LOCK FLUSH 100 UNIT/ML IV SOLN
500.0000 [IU] | Freq: Once | INTRAVENOUS | Status: AC | PRN
  Administered 2014-12-14: 500 [IU]
  Filled 2014-12-14: qty 5

## 2014-12-14 MED ORDER — ONDANSETRON 8 MG/50ML IVPB (CHCC)
8.0000 mg | Freq: Once | INTRAVENOUS | Status: AC
  Administered 2014-12-14: 8 mg via INTRAVENOUS

## 2014-12-14 MED ORDER — SODIUM CHLORIDE 0.9 % IV SOLN
206.8000 mg | Freq: Once | INTRAVENOUS | Status: AC
  Administered 2014-12-14: 210 mg via INTRAVENOUS
  Filled 2014-12-14: qty 21

## 2014-12-14 MED ORDER — FAMOTIDINE IN NACL 20-0.9 MG/50ML-% IV SOLN
INTRAVENOUS | Status: AC
  Filled 2014-12-14: qty 50

## 2014-12-14 MED ORDER — DEXAMETHASONE SODIUM PHOSPHATE 10 MG/ML IJ SOLN
INTRAMUSCULAR | Status: AC
  Filled 2014-12-14: qty 1

## 2014-12-14 NOTE — Patient Instructions (Signed)
[facility] Discharge Instructions for Patients Receiving Chemotherapy  Today you received the following chemotherapy agents: Taxol and Carboplatin.  To help prevent nausea and vomiting after your treatment, we encourage you to take your nausea medication as prescribed.   If you develop nausea and vomiting that is not controlled by your nausea medication, call the clinic.   BELOW ARE SYMPTOMS THAT SHOULD BE REPORTED IMMEDIATELY:  *FEVER GREATER THAN 100.5 F  *CHILLS WITH OR WITHOUT FEVER  NAUSEA AND VOMITING THAT IS NOT CONTROLLED WITH YOUR NAUSEA MEDICATION  *UNUSUAL SHORTNESS OF BREATH  *UNUSUAL BRUISING OR BLEEDING  TENDERNESS IN MOUTH AND THROAT WITH OR WITHOUT PRESENCE OF ULCERS  *URINARY PROBLEMS  *BOWEL PROBLEMS  UNUSUAL RASH Items with * indicate a potential emergency and should be followed up as soon as possible.  Feel free to call the clinic you have any questions or concerns. The clinic phone number is [phone].    

## 2014-12-14 NOTE — Progress Notes (Signed)
[place]  [phone] [phone]     ID: Melissa Garza DOB: [birth year]  MR#: [id number]  ERX#:[id number]  Patient Care Team: No Pcp Per Patient as PCP - General (General Practice) Gus Height, MD as Consulting Physician (Obstetrics and Gynecology) Excell Seltzer, MD as Consulting Physician (General Surgery) Chauncey Cruel, MD as Consulting Physician (Oncology) Rexene Edison, MD as Consulting Physician (Radiation Oncology) OTHER MD: Chucky May MD  CHIEF COMPLAINT: Triple negative breast cancer CURRENT TREATMENT: Neoadjuvant chemotherapy  BREAST CANCER HISTORY: From the original intake note:  Leni herself palpated a mass in her right breast approximately mid-September. She brought this to her gynecologist attention and he set her up for bilateral diagnostic mammography at the breast center 08/04/2014. The breast density was category C. in the patient has bilateral saline implants in place. In the palpable area of concern in the right breast there was an irregular mass measuring up to 2.5 cm. By palpation this was firm at the 12:30 o'clock position. Ultrasound of the right breast confirmed an irregular hypoechoic mass in this area measuring 2.5 cm. There was no right axillary lymphadenopathy noted.  Biopsy of the mass in question 08/04/2014 showed ([id number]) invasive ductal carcinoma, grade 3, triple negative, with an MIB-1 of 90%.  Bilateral breast MRI 08/09/2014 showed in the upper inner quadrant of the right breast an irregular enhancing mass measuring 2.8 cm. There was a 4 mm satellite nodule anterior to this and separated from that by 0.6 cm. The left breast, the remaining of the right breast and the lymph node areas were negative.  The patient's subsequent history is as detailed below  INTERVAL HISTORY: Tommi returns today for follow up of her breast cancer. Today is day 1, cycle 9 of 12 planned cycles of weekly paclitaxel  with carboplatin added weekly for cycles 6-12.   REVIEW OF SYSTEMS: Kinsley denies fevers, chills, nausea or vomiting. She continues to have constipation, but prefers to just deal with it. She is eating and drinking well. She denies mouth sores or rashes. She still has some fleeting numbness to her left hand for a day after treatment, but then it resolves on her own. She feels no symptoms today. She denies shortness of breath, chest pain, cough, or palpitations. Her fatigue is somewhat better than last week. She has a head cold coming on with sym[ptoms just beginning today. She feels tight in her lower back when bending over. She is using gabapentin nightly for her hot flashes.  A detailed review of systems is otherwise negative.   PAST MEDICAL HISTORY: Past Medical History  Diagnosis Date  . Glaucoma   . Anxiety   . Hot flashes   . Depression   . PONV (postoperative nausea and vomiting)   . Headache     chronic  . Shortness of breath     with exertion  . GERD (gastroesophageal reflux disease)     PMH  . Cancer     cancer of right breast  . Hepatitis     Hx: of Hepatitis not sure which one    PAST SURGICAL HISTORY: Past Surgical History  Procedure Laterality Date  . Tonsilectomy/adenoidectomy with myringotomy    . Breast enhancement surgery  2008  . Eye surgery      Lasik  . Colonoscopy    . Breast surgery      Biopsy  . Dilation and curettage of uterus    . Tubal ligation    .  Portacath placement N/A 08/13/2014    Procedure: INSERTION PORT-A-CATH;  Surgeon: Excell Seltzer, MD;  Location: [facility] OR;  Service: General;  Laterality: N/A;    FAMILY HISTORY Family History  Problem Relation Age of Onset  . Psoriasis Mother   . Hypertension Father   . Asthma Other   . Thyroid disease Other    The patient's parents are currently alive, in their late 71s. The patient had no brothers, 2 sisters. There is no history of breast or ovarian cancer in the family to her knowledge.    GYNECOLOGIC HISTORY:  No LMP recorded. Patient is postmenopausal. Menarche age 59, first live birth age 59. She is GX P2. She stopped having periods in 2005 and started hormone replacement at that time. 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 Her husband, [name], is disabled secondary to multiple cardiac problems. Son [name] lives in [place] and works in apartment maintenance. Son [name] is his wife) works as an Clinical biochemist. The patient has 6 grandchildren. She is not a Ambulance person.   ADVANCED DIRECTIVES: Not in place   HEALTH MAINTENANCE: History  Substance Use Topics  . Smoking status: Never Smoker   . Smokeless tobacco: Never Used  . Alcohol Use: No     Colonoscopy:Remote  PAP:2014  Bone density:Never  Lipid panel:  Allergies  Allergen Reactions  . Hydrocodone Hives and Itching    Current Outpatient Prescriptions  Medication Sig Dispense Refill  . acetaminophen (TYLENOL) 500 MG tablet Take 1,000 mg by mouth every 6 (six) hours as needed for mild pain.    Marland Kitchen ALPRAZolam (XANAX) 1 MG tablet Take 1 mg by mouth 4 (four) times daily as needed for anxiety.    . famotidine (PEPCID) 20 MG tablet Take 20 mg by mouth as needed.     Marland Kitchen FLUoxetine (PROZAC) 40 MG capsule Take 40 mg by mouth daily.    Marland Kitchen gabapentin (NEURONTIN) 300 MG capsule Take 1 capsule (300 mg total) by mouth at bedtime. 30 capsule 2  . lidocaine-prilocaine (EMLA) cream Apply 1 application topically as needed. Apply to portacath  1 1/2 - 2 hours as needed prior to procedures. 30 g 0  . methylphenidate 54 MG PO CR tablet Take 54 mg by mouth every morning.    . travoprost, benzalkonium, (TRAVATAN) 0.004 % ophthalmic solution Place 1 drop into both eyes at bedtime.     . traZODone (DESYREL) 50 MG tablet Take 50 mg by mouth at bedtime as needed for sleep.    Marland Kitchen zolpidem  (AMBIEN) 10 MG tablet Take 10 mg by mouth at bedtime as needed for sleep.    . Armodafinil (NUVIGIL) 250 MG tablet Take 250 mg by mouth once a week.     . butalbital-acetaminophen-caffeine (FIORICET, ESGIC) 50-325-40 MG per tablet Take 1 tablet by mouth 2 (two) times daily as needed for headache.     . CLOBETASOL PROPIONATE EX Apply 1 application topically daily as needed (DRY SCALP).    . clotrimazole-betamethasone (LOTRISONE) cream Apply 1 application topically 2 (two) times daily. (Patient not taking: Reported on 10/26/2014) 30 g 0  . fluticasone (CUTIVATE) 0.05 % cream Apply 1 application topically daily as needed (psoriasis).     . LORazepam (ATIVAN) 0.5 MG tablet Take 1 tablet (0.5 mg total) by mouth every 8 (eight) hours. (Patient not taking: Reported on  12/14/2014) 30 tablet 0  . nystatin-triamcinolone (MYCOLOG II) cream Apply 1 application topically daily as needed.     . ondansetron (ZOFRAN) 8 MG tablet Take 1 tablet (8 mg total) by mouth 2 (two) times daily. Start the day after chemo for 2 days. Then take as needed for nausea or vomiting. (Patient not taking: Reported on 12/14/2014) 30 tablet 1  . Polyvinyl Alcohol-Povidone (REFRESH OP) Place 2 drops into both eyes daily as needed (ALLERGIES).    Marland Kitchen prochlorperazine (COMPAZINE) 10 MG tablet Take 1 tablet (10 mg total) by mouth every 6 (six) hours as needed (Nausea or vomiting). (Patient not taking: Reported on 11/16/2014) 30 tablet 1   No current facility-administered medications for this visit.   Facility-Administered Medications Ordered in Other Visits  Medication Dose Route Frequency Provider Last Rate Last Dose  . CARBOplatin (PARAPLATIN) 210 mg in sodium chloride 0.9 % 100 mL chemo infusion  210 mg Intravenous Once Chauncey Cruel, MD      . heparin lock flush 100 unit/mL  500 Units Intracatheter Once PRN Chauncey Cruel, MD      . PACLitaxel (TAXOL) 132 mg in dextrose 5 % 250 mL chemo infusion (</= 80mg /m2)  80 mg/m2 (Treatment Plan  Actual) Intravenous Once Chauncey Cruel, MD 272 mL/hr at 12/14/14 1054 132 mg at 12/14/14 1054  . sodium chloride 0.9 % injection 10 mL  10 mL Intracatheter PRN Chauncey Cruel, MD   10 mL at 11/30/14 1339  . sodium chloride 0.9 % injection 10 mL  10 mL Intracatheter PRN Chauncey Cruel, MD        OBJECTIVE:  Middle-aged white woman who appears stated age 59 Vitals:   12/14/14 0926  BP: 115/86  Pulse: 105  Temp: 98.5 F (36.9 C)  Resp: 18     Body mass index is 30.15 kg/(m^2).    ECOG FS:1 - Symptomatic but completely ambulatory  Sclerae unicteric, pupils equal and reactive Oropharynx clear and moist-- no thrush No cervical or supraclavicular adenopathy Lungs no rales or rhonchi Heart regular rate and rhythm Abd soft, nontender, positive bowel sounds MSK no focal spinal tenderness, no upper extremity lymphedema Neuro: nonfocal, well oriented, appropriate affect Breasts: deferred  LAB RESULTS:  CMP     Component Value Date/Time   NA 140 12/14/2014 0916   NA 143 06/17/2009 1137   K 4.2 12/14/2014 0916   K 4.7 06/17/2009 1137   CL 114* 06/17/2009 1137   CO2 26 12/14/2014 0916   CO2 24 06/17/2009 1137   GLUCOSE 94 12/14/2014 0916   GLUCOSE 97 06/17/2009 1137   GLUCOSE 81 10/22/2006 1253   BUN 8.2 12/14/2014 0916   BUN 16 06/17/2009 1137   CREATININE 0.8 12/14/2014 0916   CREATININE 1.2 06/17/2009 1137   CALCIUM 8.8 12/14/2014 0916   CALCIUM 9.4 06/17/2009 1137   PROT 5.9* 12/14/2014 0916   PROT 7.0 06/17/2009 1137   ALBUMIN 3.2* 12/14/2014 0916   ALBUMIN 3.9 06/17/2009 1137   AST 29 12/14/2014 0916   AST 15 06/17/2009 1137   ALT 44 12/14/2014 0916   ALT 13 06/17/2009 1137   ALKPHOS 81 12/14/2014 0916   ALKPHOS 55 06/17/2009 1137   BILITOT 0.25 12/14/2014 0916   BILITOT 0.7 06/17/2009 1137   GFRNONAA 49.82 06/17/2009 1137    I No results found for: SPEP  Lab Results  Component Value Date   WBC 3.1* 12/14/2014   NEUTROABS 1.9 12/14/2014   HGB  10.5* 12/14/2014  HCT 31.8* 12/14/2014   MCV 102.3* 12/14/2014   PLT 176 12/14/2014      Chemistry      Component Value Date/Time   NA 140 12/14/2014 0916   NA 143 06/17/2009 1137   K 4.2 12/14/2014 0916   K 4.7 06/17/2009 1137   CL 114* 06/17/2009 1137   CO2 26 12/14/2014 0916   CO2 24 06/17/2009 1137   BUN 8.2 12/14/2014 0916   BUN 16 06/17/2009 1137   CREATININE 0.8 12/14/2014 0916   CREATININE 1.2 06/17/2009 1137      Component Value Date/Time   CALCIUM 8.8 12/14/2014 0916   CALCIUM 9.4 06/17/2009 1137   ALKPHOS 81 12/14/2014 0916   ALKPHOS 55 06/17/2009 1137   AST 29 12/14/2014 0916   AST 15 06/17/2009 1137   ALT 44 12/14/2014 0916   ALT 13 06/17/2009 1137   BILITOT 0.25 12/14/2014 0916   BILITOT 0.7 06/17/2009 1137      No results found for: LABCA2  No components found for: [id number]  No results for input(s): INR in the last 168 hours.  Urinalysis No results found for: COLORURINE  STUDIES: No results found.  ASSESSMENT: 59 y.o. [place] woman status post right breast upper inner quadrant biopsy 08/04/2014 for a clinical T2 N0, stage IIA invasive ductal carcinoma, grade 3, triple negative, with an MIB-1 of 90%.  1. neoadjuvant chemotherapy with doxorubicin and cyclophosphamide dose dense x4 completed on 10/05/14; followed by paclitaxel weekly x12 started 10/19/2014. Carboplatin added for cycles 6-12.  2. Surgery to follow chemotherapy  3. Radiation to follow surgery  PLAN: Solae looks and feels well today. The labs were reviewed in detail and were entirely stable. She will continue with cycle 9 of paclitaxel and carboplatin.   I suggested she perform some light stretching routines to work out the kinks in her back. She will treat her sinus complaints symptomatically at this point.  Marirose will return next week for labs, a follow up visit, and cycle 10 of treatment. She understands and agrees with this plan. She knows the goal of treatment in her case  is cure. She has been encouraged to call with any issues that might arise before her next visit here.   Marcelino Duster, [place] [phone] 12/14/2014 11:33 AM

## 2014-12-14 NOTE — Progress Notes (Signed)
Melissa Garza amount is $1000 for the [employer].

## 2014-12-20 ENCOUNTER — Other Ambulatory Visit: Payer: Self-pay | Admitting: *Deleted

## 2014-12-20 DIAGNOSIS — C50211 Malignant neoplasm of upper-inner quadrant of right female breast: Secondary | ICD-10-CM

## 2014-12-21 ENCOUNTER — Telehealth: Payer: Self-pay | Admitting: Oncology

## 2014-12-21 ENCOUNTER — Other Ambulatory Visit (HOSPITAL_BASED_OUTPATIENT_CLINIC_OR_DEPARTMENT_OTHER): Payer: 59

## 2014-12-21 ENCOUNTER — Encounter: Payer: Self-pay | Admitting: Nurse Practitioner

## 2014-12-21 ENCOUNTER — Ambulatory Visit (HOSPITAL_BASED_OUTPATIENT_CLINIC_OR_DEPARTMENT_OTHER): Payer: 59

## 2014-12-21 ENCOUNTER — Ambulatory Visit (HOSPITAL_BASED_OUTPATIENT_CLINIC_OR_DEPARTMENT_OTHER): Payer: 59 | Admitting: Nurse Practitioner

## 2014-12-21 VITALS — BP 117/73 | HR 102 | Temp 97.9°F | Resp 18 | Ht 60.0 in | Wt 154.4 lb

## 2014-12-21 DIAGNOSIS — N951 Menopausal and female climacteric states: Secondary | ICD-10-CM

## 2014-12-21 DIAGNOSIS — Z171 Estrogen receptor negative status [ER-]: Secondary | ICD-10-CM

## 2014-12-21 DIAGNOSIS — C50211 Malignant neoplasm of upper-inner quadrant of right female breast: Secondary | ICD-10-CM

## 2014-12-21 DIAGNOSIS — Z5111 Encounter for antineoplastic chemotherapy: Secondary | ICD-10-CM

## 2014-12-21 DIAGNOSIS — R232 Flushing: Secondary | ICD-10-CM

## 2014-12-21 LAB — COMPREHENSIVE METABOLIC PANEL (CC13)
ALK PHOS: 77 U/L (ref 40–150)
ALT: 25 U/L (ref 0–55)
ANION GAP: 8 meq/L (ref 3–11)
AST: 19 U/L (ref 5–34)
Albumin: 3.3 g/dL — ABNORMAL LOW (ref 3.5–5.0)
BUN: 12.7 mg/dL (ref 7.0–26.0)
CO2: 26 mEq/L (ref 22–29)
Calcium: 9.2 mg/dL (ref 8.4–10.4)
Chloride: 105 mEq/L (ref 98–109)
Creatinine: 0.8 mg/dL (ref 0.6–1.1)
EGFR: 86 mL/min/{1.73_m2} — ABNORMAL LOW (ref >90)
Glucose: 98 mg/dl (ref 70–140)
POTASSIUM: 4 meq/L (ref 3.5–5.1)
Sodium: 139 mEq/L (ref 136–145)
TOTAL PROTEIN: 6 g/dL — AB (ref 6.4–8.3)
Total Bilirubin: 0.22 mg/dL (ref 0.20–1.20)

## 2014-12-21 LAB — CBC WITH DIFFERENTIAL/PLATELET
BASO%: 1.6 % (ref 0.0–2.0)
Basophils Absolute: 0 10*3/uL (ref 0.0–0.1)
EOS ABS: 0 10*3/uL (ref 0.0–0.5)
EOS%: 1.3 % (ref 0.0–7.0)
HEMATOCRIT: 33.3 % — AB (ref 34.8–46.6)
HGB: 10.6 g/dL — ABNORMAL LOW (ref 11.6–15.9)
LYMPH%: 25.2 % (ref 14.0–49.7)
MCH: 32.6 pg (ref 25.1–34.0)
MCHC: 31.9 g/dL (ref 31.5–36.0)
MCV: 102.3 fL — ABNORMAL HIGH (ref 79.5–101.0)
MONO#: 0.3 10*3/uL (ref 0.1–0.9)
MONO%: 10.9 % (ref 0.0–14.0)
NEUT%: 61 % (ref 38.4–76.8)
NEUTROS ABS: 1.5 10*3/uL (ref 1.5–6.5)
Platelets: 182 10*3/uL (ref 145–400)
RBC: 3.26 10*6/uL — ABNORMAL LOW (ref 3.70–5.45)
RDW: 15.8 % — ABNORMAL HIGH (ref 11.2–14.5)
WBC: 2.5 10*3/uL — ABNORMAL LOW (ref 3.9–10.3)
lymph#: 0.6 10*3/uL — ABNORMAL LOW (ref 0.9–3.3)

## 2014-12-21 MED ORDER — DIPHENHYDRAMINE HCL 50 MG/ML IJ SOLN
INTRAMUSCULAR | Status: AC
  Filled 2014-12-21: qty 1

## 2014-12-21 MED ORDER — ONDANSETRON 8 MG/50ML IVPB (CHCC)
8.0000 mg | Freq: Once | INTRAVENOUS | Status: AC
  Administered 2014-12-21: 8 mg via INTRAVENOUS

## 2014-12-21 MED ORDER — FAMOTIDINE IN NACL 20-0.9 MG/50ML-% IV SOLN
20.0000 mg | Freq: Once | INTRAVENOUS | Status: AC
  Administered 2014-12-21: 20 mg via INTRAVENOUS

## 2014-12-21 MED ORDER — SODIUM CHLORIDE 0.9 % IJ SOLN
10.0000 mL | INTRAMUSCULAR | Status: DC | PRN
  Administered 2014-12-21: 10 mL
  Filled 2014-12-21: qty 10

## 2014-12-21 MED ORDER — PACLITAXEL CHEMO INJECTION 300 MG/50ML
80.0000 mg/m2 | Freq: Once | INTRAVENOUS | Status: AC
  Administered 2014-12-21: 132 mg via INTRAVENOUS
  Filled 2014-12-21: qty 22

## 2014-12-21 MED ORDER — DIPHENHYDRAMINE HCL 50 MG/ML IJ SOLN
25.0000 mg | Freq: Once | INTRAMUSCULAR | Status: AC
  Administered 2014-12-21: 25 mg via INTRAVENOUS

## 2014-12-21 MED ORDER — DEXAMETHASONE SODIUM PHOSPHATE 10 MG/ML IJ SOLN
4.0000 mg | Freq: Once | INTRAMUSCULAR | Status: AC
  Administered 2014-12-21: 4 mg via INTRAVENOUS

## 2014-12-21 MED ORDER — SODIUM CHLORIDE 0.9 % IV SOLN
Freq: Once | INTRAVENOUS | Status: AC
  Administered 2014-12-21: 11:00:00 via INTRAVENOUS

## 2014-12-21 MED ORDER — SODIUM CHLORIDE 0.9 % IV SOLN
206.8000 mg | Freq: Once | INTRAVENOUS | Status: AC
  Administered 2014-12-21: 210 mg via INTRAVENOUS
  Filled 2014-12-21: qty 21

## 2014-12-21 MED ORDER — GABAPENTIN 100 MG PO CAPS: 100.0000 mg | ORAL_CAPSULE | Freq: Every day | ORAL | Status: DC

## 2014-12-21 MED ORDER — FAMOTIDINE IN NACL 20-0.9 MG/50ML-% IV SOLN
INTRAVENOUS | Status: AC
  Filled 2014-12-21: qty 50

## 2014-12-21 MED ORDER — HEPARIN SOD (PORK) LOCK FLUSH 100 UNIT/ML IV SOLN
500.0000 [IU] | Freq: Once | INTRAVENOUS | Status: AC | PRN
  Administered 2014-12-21: 500 [IU]
  Filled 2014-12-21: qty 5

## 2014-12-21 MED ORDER — DEXAMETHASONE SODIUM PHOSPHATE 10 MG/ML IJ SOLN
INTRAMUSCULAR | Status: AC
  Filled 2014-12-21: qty 1

## 2014-12-21 MED ORDER — ONDANSETRON 8 MG/NS 50 ML IVPB
INTRAVENOUS | Status: AC
  Filled 2014-12-21: qty 8

## 2014-12-21 NOTE — Patient Instructions (Signed)
[facility] Discharge Instructions for Patients Receiving Chemotherapy  Today you received the following chemotherapy agents taxol/carboplatin  To help prevent nausea and vomiting after your treatment, we encourage you to take your nausea medication as directed   If you develop nausea and vomiting that is not controlled by your nausea medication, call the clinic.   BELOW ARE SYMPTOMS THAT SHOULD BE REPORTED IMMEDIATELY:  *FEVER GREATER THAN 100.5 F  *CHILLS WITH OR WITHOUT FEVER  NAUSEA AND VOMITING THAT IS NOT CONTROLLED WITH YOUR NAUSEA MEDICATION  *UNUSUAL SHORTNESS OF BREATH  *UNUSUAL BRUISING OR BLEEDING  TENDERNESS IN MOUTH AND THROAT WITH OR WITHOUT PRESENCE OF ULCERS  *URINARY PROBLEMS  *BOWEL PROBLEMS  UNUSUAL RASH Items with * indicate a potential emergency and should be followed up as soon as possible.  Feel free to call the clinic you have any questions or concerns. The clinic phone number is [phone].  

## 2014-12-21 NOTE — Addendum Note (Signed)
Addended by: Marcelino Duster on: 12/21/2014 02:16 PM   Modules accepted: Orders

## 2014-12-21 NOTE — Telephone Encounter (Signed)
gave pt avs report and appts for feb/march. [facility] will call w/mri appt - pt aware. pt given appt w/Dr. Excell Seltzer for 3/11 @ 9am. s/w Sharyn Lull (nurse) @ [facility] re appt.

## 2014-12-21 NOTE — Progress Notes (Signed)
[facility]  [phone] Fax:(336) [phone]     ID: LATARSHA ZANI DOB: November 11, 1955  MR#: [id number]  AOZ#:[id number]  Patient Care Team: No Pcp Per Patient as PCP - General (General Practice) Gus Height, MD as Consulting Physician (Obstetrics and Gynecology) Excell Seltzer, MD as Consulting Physician (General Surgery) Chauncey Cruel, MD as Consulting Physician (Oncology) Rexene Edison, MD as Consulting Physician (Radiation Oncology) OTHER MD: Chucky May MD  CHIEF COMPLAINT: Triple negative breast cancer CURRENT TREATMENT: Neoadjuvant chemotherapy  BREAST CANCER HISTORY: From the original intake note:  Eddis herself palpated a mass in her right breast approximately mid-September. She brought this to her gynecologist attention and he set her up for bilateral diagnostic mammography at the breast center 08/04/2014. The breast density was category C. in the patient has bilateral saline implants in place. In the palpable area of concern in the right breast there was an irregular mass measuring up to 2.5 cm. By palpation this was firm at the 12:30 o'clock position. Ultrasound of the right breast confirmed an irregular hypoechoic mass in this area measuring 2.5 cm. There was no right axillary lymphadenopathy noted.  Biopsy of the mass in question 08/04/2014 showed ([id number]) invasive ductal carcinoma, grade 3, triple negative, with an MIB-1 of 90%.  Bilateral breast MRI 08/09/2014 showed in the upper inner quadrant of the right breast an irregular enhancing mass measuring 2.8 cm. There was a 4 mm satellite nodule anterior to this and separated from that by 0.6 cm. The left breast, the remaining of the right breast and the lymph node areas were negative.  The patient's subsequent history is as detailed below  INTERVAL HISTORY: Persais returns today for follow up of her breast cancer. Today is day 1, cycle 10 of 12 planned cycles of weekly paclitaxel  with carboplatin added weekly for cycles 6-12. She is battling sinus congestion and drainage that is cleat to yellow for the past 3 days. She has not taken anything for these symptoms because she was unsure of what to take.  REVIEW OF SYSTEMS: Tzipora denies fevers, chills, nausea or vomiting. She deals with constipation off and on. She is eating and drinking well. She denies mouth sores or rashes. She still has some fleeting numbness to her left hand for a day after treatment, but then it resolves on her own. She feels no symptoms today. She denies shortness of breath, chest pain, cough, or palpitations. Her fatigue is worse this week. She is using gabapentin nightly for her hot flashes, but wants to talk about options for hot flashes during the day.  A detailed review of systems is otherwise negative.   PAST MEDICAL HISTORY: Past Medical History  Diagnosis Date  . Glaucoma   . Anxiety   . Hot flashes   . Depression   . PONV (postoperative nausea and vomiting)   . Headache     chronic  . Shortness of breath     with exertion  . GERD (gastroesophageal reflux disease)     PMH  . Cancer     cancer of right breast  . Hepatitis     Hx: of Hepatitis not sure which one    PAST SURGICAL HISTORY: Past Surgical History  Procedure Laterality Date  . Tonsilectomy/adenoidectomy with myringotomy    . Breast enhancement surgery  2008  . Eye surgery      Lasik  . Colonoscopy    . Breast surgery      Biopsy  .  Dilation and curettage of uterus    . Tubal ligation    . Portacath placement N/A 08/13/2014    Procedure: INSERTION PORT-A-CATH;  Surgeon: Excell Seltzer, MD;  [facility];  Service: General;  Laterality: N/A;    FAMILY HISTORY Family History  Problem Relation Age of Onset  . Psoriasis Mother   . Hypertension Father   . Asthma Other   . Thyroid disease Other    The patient's parents are currently alive, in their late 67s. The patient had no brothers, 2 sisters. There is no  history of breast or ovarian cancer in the family to her knowledge.   GYNECOLOGIC HISTORY:  No LMP recorded. Patient is postmenopausal. Menarche age 59, first live birth age 59. She is GX P2. She stopped having periods in 2005 and started hormone replacement at that time. 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 Her husband, [name], is disabled secondary to multiple cardiac problems. Son [name] lives in [place] and works in apartment maintenance. Son [name] is his wife) works as an Clinical biochemist. The patient has 6 grandchildren. She is not a Ambulance person.   ADVANCED DIRECTIVES: Not in place   HEALTH MAINTENANCE: History  Substance Use Topics  . Smoking status: Never Smoker   . Smokeless tobacco: Never Used  . Alcohol Use: No     Colonoscopy:Remote  PAP:2014  Bone density:Never  Lipid panel:  Allergies  Allergen Reactions  . Hydrocodone Hives and Itching    Current Outpatient Prescriptions  Medication Sig Dispense Refill  . acetaminophen (TYLENOL) 500 MG tablet Take 1,000 mg by mouth every 6 (six) hours as needed for mild pain.    Marland Kitchen ALPRAZolam (XANAX) 1 MG tablet Take 1 mg by mouth 4 (four) times daily as needed for anxiety.    . famotidine (PEPCID) 20 MG tablet Take 20 mg by mouth as needed.     Marland Kitchen FLUoxetine (PROZAC) 40 MG capsule Take 40 mg by mouth daily.    Marland Kitchen gabapentin (NEURONTIN) 300 MG capsule Take 1 capsule (300 mg total) by mouth at bedtime. 30 capsule 2  . lidocaine-prilocaine (EMLA) cream Apply 1 application topically as needed. Apply to portacath  1 1/2 - 2 hours as needed prior to procedures. 30 g 0  . LORazepam (ATIVAN) 0.5 MG tablet Take 1 tablet (0.5 mg total) by mouth every 8 (eight) hours. 30 tablet 0  . methylphenidate 54 MG PO CR tablet Take 54 mg by mouth every morning.    . Polyvinyl Alcohol-Povidone (REFRESH  OP) Place 2 drops into both eyes daily as needed (ALLERGIES).    Marland Kitchen travoprost, benzalkonium, (TRAVATAN) 0.004 % ophthalmic solution Place 1 drop into both eyes at bedtime.     . traZODone (DESYREL) 50 MG tablet Take 50 mg by mouth at bedtime as needed for sleep.    Marland Kitchen zolpidem (AMBIEN) 10 MG tablet Take 10 mg by mouth at bedtime as needed for sleep.    . Armodafinil (NUVIGIL) 250 MG tablet Take 250 mg by mouth once a week.     . butalbital-acetaminophen-caffeine (FIORICET, ESGIC) 50-325-40 MG per tablet Take 1 tablet by mouth 2 (two) times daily as needed for headache.     . CLOBETASOL PROPIONATE EX Apply 1 application topically daily as needed (DRY SCALP).    . clotrimazole-betamethasone (LOTRISONE) cream Apply 1 application topically  2 (two) times daily. (Patient not taking: Reported on 10/26/2014) 30 g 0  . fluticasone (CUTIVATE) 0.05 % cream Apply 1 application topically daily as needed (psoriasis).     . gabapentin (NEURONTIN) 100 MG capsule Take 1 capsule (100 mg total) by mouth daily. 60 capsule 2  . nystatin-triamcinolone (MYCOLOG II) cream Apply 1 application topically daily as needed.     . ondansetron (ZOFRAN) 8 MG tablet Take 1 tablet (8 mg total) by mouth 2 (two) times daily. Start the day after chemo for 2 days. Then take as needed for nausea or vomiting. (Patient not taking: Reported on 12/21/2014) 30 tablet 1  . prochlorperazine (COMPAZINE) 10 MG tablet Take 1 tablet (10 mg total) by mouth every 6 (six) hours as needed (Nausea or vomiting). (Patient not taking: Reported on 11/16/2014) 30 tablet 1   No current facility-administered medications for this visit.   Facility-Administered Medications Ordered in Other Visits  Medication Dose Route Frequency Provider Last Rate Last Dose  . sodium chloride 0.9 % injection 10 mL  10 mL Intracatheter PRN Chauncey Cruel, MD   10 mL at 11/30/14 1339    OBJECTIVE:  Middle-aged white woman who appears stated age 59 Vitals:   12/21/14 0933    BP: 117/73  Pulse: 102  Temp: 97.9 F (36.6 C)  Resp: 18     Body mass index is 30.16 kg/(m^2).    ECOG FS:1 - Symptomatic but completely ambulatory  Skin: warm, dry  HEENT: sclerae anicteric, conjunctivae pink, oropharynx clear. No thrush or mucositis.  Lymph Nodes: No cervical or supraclavicular lymphadenopathy  Lungs: clear to auscultation bilaterally, no rales, wheezes, or rhonci  Heart: regular rate and rhythm  Abdomen: round, soft, non tender, positive bowel sounds  Musculoskeletal: No focal spinal tenderness, no peripheral edema  Neuro: non focal, well oriented, positive affect  Breasts: deferred  LAB RESULTS:  CMP     Component Value Date/Time   NA 139 12/21/2014 0924   NA 143 06/17/2009 1137   K 4.0 12/21/2014 0924   K 4.7 06/17/2009 1137   CL 114* 06/17/2009 1137   CO2 26 12/21/2014 0924   CO2 24 06/17/2009 1137   GLUCOSE 98 12/21/2014 0924   GLUCOSE 97 06/17/2009 1137   GLUCOSE 81 10/22/2006 1253   BUN 12.7 12/21/2014 0924   BUN 16 06/17/2009 1137   CREATININE 0.8 12/21/2014 0924   CREATININE 1.2 06/17/2009 1137   CALCIUM 9.2 12/21/2014 0924   CALCIUM 9.4 06/17/2009 1137   PROT 6.0* 12/21/2014 0924   PROT 7.0 06/17/2009 1137   ALBUMIN 3.3* 12/21/2014 0924   ALBUMIN 3.9 06/17/2009 1137   AST 19 12/21/2014 0924   AST 15 06/17/2009 1137   ALT 25 12/21/2014 0924   ALT 13 06/17/2009 1137   ALKPHOS 77 12/21/2014 0924   ALKPHOS 55 06/17/2009 1137   BILITOT 0.22 12/21/2014 0924   BILITOT 0.7 06/17/2009 1137   GFRNONAA 49.82 06/17/2009 1137    I No results found for: SPEP  Lab Results  Component Value Date   WBC 2.5* 12/21/2014   NEUTROABS 1.5 12/21/2014   HGB 10.6* 12/21/2014   HCT 33.3* 12/21/2014   MCV 102.3* 12/21/2014   PLT 182 12/21/2014      Chemistry      Component Value Date/Time   NA 139 12/21/2014 0924   NA 143 06/17/2009 1137   K 4.0 12/21/2014 0924   K 4.7 06/17/2009 1137   CL 114* 06/17/2009 1137   CO2 26  12/21/2014 0924    CO2 24 06/17/2009 1137   BUN 12.7 12/21/2014 0924   BUN 16 06/17/2009 1137   CREATININE 0.8 12/21/2014 0924   CREATININE 1.2 06/17/2009 1137      Component Value Date/Time   CALCIUM 9.2 12/21/2014 0924   CALCIUM 9.4 06/17/2009 1137   ALKPHOS 77 12/21/2014 0924   ALKPHOS 55 06/17/2009 1137   AST 19 12/21/2014 0924   AST 15 06/17/2009 1137   ALT 25 12/21/2014 0924   ALT 13 06/17/2009 1137   BILITOT 0.22 12/21/2014 0924   BILITOT 0.7 06/17/2009 1137      No results found for: LABCA2  No components found for: LABCA125  No results for input(s): INR in the last 168 hours.  Urinalysis No results found for: COLORURINE  STUDIES: No results found.  ASSESSMENT: 59 y.o. [place] woman status post right breast upper inner quadrant biopsy 08/04/2014 for a clinical T2 N0, stage IIA invasive ductal carcinoma, grade 3, triple negative, with an MIB-1 of 90%.  1. neoadjuvant chemotherapy with doxorubicin and cyclophosphamide dose dense x4 completed on 10/05/14; followed by paclitaxel weekly x12 started 10/19/2014. Carboplatin added for cycles 6-12.  2. Surgery to follow chemotherapy  3. Radiation to follow surgery  PLAN: Topanga is feeling a bit sickly today because of her sinus symptoms. I suggested she try some Sudafed this week. The labs were reviewed in detail and were entirely stable. She will continue with cycle 10 of paclitaxel and carboplatin.   As she is finishing up chemotherapy I have placed orders for her final breast MRI to be performed at the completion of her regimen. She will meet with Dr. Jana Hakim the week following to discuss the results, and I have placed orders for a consult visit with Dr. Excell Seltzer sometime in March as well.  She would like more done about her daytime hot flashes. She is already on prozac, so venlafaxine is not an option. 300mg  of gabapentin QHS helps with her hot flashes, but tends to cause mild drowsiness. I have sent in a prescription for 100mg   gabapentin to take in the day time to experiment with this side effect. If she is still alert on this dose, she may increase to 200mg  in the day and continue with 300mg  at night.  Jeidi will return next week for labs, a follow up visit, and cycle 11 of treatment. She understands and agrees with this plan. She knows the goal of treatment in her case is cure. She has been encouraged to call with any issues that might arise before her next visit here.   Marcelino Duster, [place] [phone] 12/21/2014 10:03 AM

## 2014-12-28 ENCOUNTER — Ambulatory Visit (HOSPITAL_BASED_OUTPATIENT_CLINIC_OR_DEPARTMENT_OTHER): Payer: 59

## 2014-12-28 ENCOUNTER — Other Ambulatory Visit (HOSPITAL_BASED_OUTPATIENT_CLINIC_OR_DEPARTMENT_OTHER): Payer: 59

## 2014-12-28 ENCOUNTER — Ambulatory Visit (HOSPITAL_BASED_OUTPATIENT_CLINIC_OR_DEPARTMENT_OTHER): Payer: 59 | Admitting: Nurse Practitioner

## 2014-12-28 ENCOUNTER — Encounter: Payer: Self-pay | Admitting: Nurse Practitioner

## 2014-12-28 VITALS — BP 113/82 | HR 127 | Temp 98.5°F | Resp 20 | Wt 156.7 lb

## 2014-12-28 VITALS — HR 108

## 2014-12-28 DIAGNOSIS — Z5111 Encounter for antineoplastic chemotherapy: Secondary | ICD-10-CM

## 2014-12-28 DIAGNOSIS — Z171 Estrogen receptor negative status [ER-]: Secondary | ICD-10-CM

## 2014-12-28 DIAGNOSIS — C50211 Malignant neoplasm of upper-inner quadrant of right female breast: Secondary | ICD-10-CM

## 2014-12-28 DIAGNOSIS — E860 Dehydration: Secondary | ICD-10-CM

## 2014-12-28 DIAGNOSIS — Z452 Encounter for adjustment and management of vascular access device: Secondary | ICD-10-CM

## 2014-12-28 LAB — CBC WITH DIFFERENTIAL/PLATELET
BASO%: 0.3 % (ref 0.0–2.0)
Basophils Absolute: 0 10*3/uL (ref 0.0–0.1)
EOS%: 1 % (ref 0.0–7.0)
Eosinophils Absolute: 0 10*3/uL (ref 0.0–0.5)
HEMATOCRIT: 33.2 % — AB (ref 34.8–46.6)
HEMOGLOBIN: 11.1 g/dL — AB (ref 11.6–15.9)
LYMPH%: 21.4 % (ref 14.0–49.7)
MCH: 33.8 pg (ref 25.1–34.0)
MCHC: 33.4 g/dL (ref 31.5–36.0)
MCV: 101.2 fL — AB (ref 79.5–101.0)
MONO#: 0.3 10*3/uL (ref 0.1–0.9)
MONO%: 9.3 % (ref 0.0–14.0)
NEUT#: 2.1 10*3/uL (ref 1.5–6.5)
NEUT%: 68 % (ref 38.4–76.8)
Platelets: 143 10*3/uL — ABNORMAL LOW (ref 145–400)
RBC: 3.28 10*6/uL — ABNORMAL LOW (ref 3.70–5.45)
RDW: 14.8 % — AB (ref 11.2–14.5)
WBC: 3.1 10*3/uL — AB (ref 3.9–10.3)
lymph#: 0.7 10*3/uL — ABNORMAL LOW (ref 0.9–3.3)

## 2014-12-28 LAB — COMPREHENSIVE METABOLIC PANEL (CC13)
ALK PHOS: 77 U/L (ref 40–150)
ALT: 25 U/L (ref 0–55)
AST: 20 U/L (ref 5–34)
Albumin: 3.1 g/dL — ABNORMAL LOW (ref 3.5–5.0)
Anion Gap: 10 mEq/L (ref 3–11)
BILIRUBIN TOTAL: 0.2 mg/dL (ref 0.20–1.20)
BUN: 14.3 mg/dL (ref 7.0–26.0)
CALCIUM: 8.7 mg/dL (ref 8.4–10.4)
CO2: 25 mEq/L (ref 22–29)
CREATININE: 0.8 mg/dL (ref 0.6–1.1)
Chloride: 106 mEq/L (ref 98–109)
EGFR: 79 mL/min/{1.73_m2} — ABNORMAL LOW (ref >90)
Glucose: 83 mg/dl (ref 70–140)
Potassium: 3.6 mEq/L (ref 3.5–5.1)
Sodium: 141 mEq/L (ref 136–145)
Total Protein: 6 g/dL — ABNORMAL LOW (ref 6.4–8.3)

## 2014-12-28 MED ORDER — PACLITAXEL CHEMO INJECTION 300 MG/50ML
80.0000 mg/m2 | Freq: Once | INTRAVENOUS | Status: AC
  Administered 2014-12-28: 132 mg via INTRAVENOUS
  Filled 2014-12-28: qty 22

## 2014-12-28 MED ORDER — SODIUM CHLORIDE 0.9 % IJ SOLN
10.0000 mL | INTRAMUSCULAR | Status: DC | PRN
  Filled 2014-12-28: qty 10

## 2014-12-28 MED ORDER — ONDANSETRON 8 MG/50ML IVPB (CHCC)
8.0000 mg | Freq: Once | INTRAVENOUS | Status: AC
  Administered 2014-12-28: 8 mg via INTRAVENOUS

## 2014-12-28 MED ORDER — HEPARIN SOD (PORK) LOCK FLUSH 100 UNIT/ML IV SOLN
500.0000 [IU] | Freq: Once | INTRAVENOUS | Status: DC | PRN
  Filled 2014-12-28: qty 5

## 2014-12-28 MED ORDER — FAMOTIDINE IN NACL 20-0.9 MG/50ML-% IV SOLN
20.0000 mg | Freq: Once | INTRAVENOUS | Status: AC
  Administered 2014-12-28: 20 mg via INTRAVENOUS

## 2014-12-28 MED ORDER — DIPHENHYDRAMINE HCL 50 MG/ML IJ SOLN
25.0000 mg | Freq: Once | INTRAMUSCULAR | Status: AC
  Administered 2014-12-28: 25 mg via INTRAVENOUS

## 2014-12-28 MED ORDER — DEXAMETHASONE SODIUM PHOSPHATE 10 MG/ML IJ SOLN
4.0000 mg | Freq: Once | INTRAMUSCULAR | Status: AC
  Administered 2014-12-28: 4 mg via INTRAVENOUS

## 2014-12-28 MED ORDER — SODIUM CHLORIDE 0.9 % IV SOLN
1000.0000 mL | Freq: Once | INTRAVENOUS | Status: AC
  Administered 2014-12-28: 500 mL via INTRAVENOUS

## 2014-12-28 MED ORDER — SODIUM CHLORIDE 0.9 % IV SOLN
206.8000 mg | Freq: Once | INTRAVENOUS | Status: AC
  Administered 2014-12-28: 210 mg via INTRAVENOUS
  Filled 2014-12-28: qty 21

## 2014-12-28 MED ORDER — ALTEPLASE 2 MG IJ SOLR
2.0000 mg | Freq: Once | INTRAMUSCULAR | Status: AC
  Administered 2014-12-28: 2 mg
  Filled 2014-12-28: qty 2

## 2014-12-28 MED ORDER — SODIUM CHLORIDE 0.9 % IV SOLN
Freq: Once | INTRAVENOUS | Status: AC
  Administered 2014-12-28: 11:00:00 via INTRAVENOUS

## 2014-12-28 MED ORDER — GABAPENTIN 300 MG PO CAPS: 300.0000 mg | ORAL_CAPSULE | Freq: Every day | ORAL | Status: DC

## 2014-12-28 NOTE — Progress Notes (Signed)
[facility]  Telephone:(336) [phone] Fax:(336) [phone]     ID: AYLEN STRADFORD DOB: Mar 21, 1956  MR#: [id number]  XFG#:[id number]  Patient Care Team: No Pcp Per Patient as PCP - General (General Practice) Gus Height, MD as Consulting Physician (Obstetrics and Gynecology) Excell Seltzer, MD as Consulting Physician (General Surgery) Chauncey Cruel, MD as Consulting Physician (Oncology) Rexene Edison, MD as Consulting Physician (Radiation Oncology) OTHER MD: Chucky May MD  CHIEF COMPLAINT: Triple negative breast cancer CURRENT TREATMENT: Neoadjuvant chemotherapy  BREAST CANCER HISTORY: From the original intake note:  Kamille herself palpated a mass in her right breast approximately mid-September. She brought this to her gynecologist attention and he set her up for bilateral diagnostic mammography at the breast center 08/04/2014. The breast density was category C. in the patient has bilateral saline implants in place. In the palpable area of concern in the right breast there was an irregular mass measuring up to 2.5 cm. By palpation this was firm at the 12:30 o'clock position. Ultrasound of the right breast confirmed an irregular hypoechoic mass in this area measuring 2.5 cm. There was no right axillary lymphadenopathy noted.  Biopsy of the mass in question 08/04/2014 showed ([id number]) invasive ductal carcinoma, grade 3, triple negative, with an MIB-1 of 90%.  Bilateral breast MRI 08/09/2014 showed in the upper inner quadrant of the right breast an irregular enhancing mass measuring 2.8 cm. There was a 4 mm satellite nodule anterior to this and separated from that by 0.6 cm. The left breast, the remaining of the right breast and the lymph node areas were negative.  The patient's subsequent history is as detailed below  INTERVAL HISTORY: Asenath returns today for follow up of her breast cancer. Today is day 1, cycle 11 of 12 planned cycles of weekly paclitaxel  with carboplatin added weekly for cycles 6-12. Her HR is elevated, likely secondary to dehydration and lack of water intake. She has also had mild headaches that come and go, supporting this theory. Her hot flashes are better in the daytime now that we added 100mg  gabapentin daily to her routine. She continues to battle fatigue.  REVIEW OF SYSTEMS: Barby denies fevers, chills, nausea or vomiting. She deals with constipation off and on. Her appetite is fair. She denies mouth sores or rashes. She still has some fleeting numbness to her left hand for a day after treatment, but then it resolves on her own. She feels no symptoms today. She denies shortness of breath, chest pain, cough, or palpitations.  A detailed review of systems is otherwise negative.   PAST MEDICAL HISTORY: Past Medical History  Diagnosis Date  . Glaucoma   . Anxiety   . Hot flashes   . Depression   . PONV (postoperative nausea and vomiting)   . Headache     chronic  . Shortness of breath     with exertion  . GERD (gastroesophageal reflux disease)     PMH  . Cancer     cancer of right breast  . Hepatitis     Hx: of Hepatitis not sure which one    PAST SURGICAL HISTORY: Past Surgical History  Procedure Laterality Date  . Tonsilectomy/adenoidectomy with myringotomy    . Breast enhancement surgery  2008  . Eye surgery      Lasik  . Colonoscopy    . Breast surgery      Biopsy  . Dilation and curettage of uterus    . Tubal ligation    .  Portacath placement N/A 08/13/2014    Procedure: INSERTION PORT-A-CATH;  Surgeon: Excell Seltzer, MD;  Location: [facility] OR;  Service: General;  Laterality: N/A;    FAMILY HISTORY Family History  Problem Relation Age of Onset  . Psoriasis Mother   . Hypertension Father   . Asthma Other   . Thyroid disease Other    The patient's parents are currently alive, in their late 11s. The patient had no brothers, 2 sisters. There is no history of breast or ovarian cancer in the family to  her knowledge.   GYNECOLOGIC HISTORY:  No LMP recorded. Patient is postmenopausal. Menarche age 65, first live birth age 31. She is GX P2. She stopped having periods in 2005 and started hormone replacement at that time. 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 Her husband, [name], is disabled secondary to multiple cardiac problems. Son [name] lives in [place] and works in apartment maintenance. Son [name] is his wife) works as an Clinical biochemist. The patient has 6 grandchildren. She is not a Ambulance person.   ADVANCED DIRECTIVES: Not in place   HEALTH MAINTENANCE: History  Substance Use Topics  . Smoking status: Never Smoker   . Smokeless tobacco: Never Used  . Alcohol Use: No     Colonoscopy:Remote  PAP:2014  Bone density:Never  Lipid panel:  Allergies  Allergen Reactions  . Hydrocodone Hives and Itching    Current Outpatient Prescriptions  Medication Sig Dispense Refill  . acetaminophen (TYLENOL) 500 MG tablet Take 1,000 mg by mouth every 6 (six) hours as needed for mild pain.    Marland Kitchen ALPRAZolam (XANAX) 1 MG tablet Take 1 mg by mouth 4 (four) times daily as needed for anxiety.    Marland Kitchen FLUoxetine (PROZAC) 40 MG capsule Take 40 mg by mouth daily.    Marland Kitchen gabapentin (NEURONTIN) 100 MG capsule Take 1 capsule (100 mg total) by mouth daily. 60 capsule 2  . gabapentin (NEURONTIN) 300 MG capsule Take 1 capsule (300 mg total) by mouth at bedtime. 90 capsule 2  . lidocaine-prilocaine (EMLA) cream Apply 1 application topically as needed. Apply to portacath  1 1/2 - 2 hours as needed prior to procedures. 30 g 0  . methylphenidate 54 MG PO CR tablet Take 54 mg by mouth every morning.    . Polyvinyl Alcohol-Povidone (REFRESH OP) Place 2 drops into both eyes daily as needed (ALLERGIES).    Marland Kitchen travoprost, benzalkonium, (TRAVATAN) 0.004 % ophthalmic solution  Place 1 drop into both eyes at bedtime.     . traZODone (DESYREL) 50 MG tablet Take 50 mg by mouth at bedtime as needed for sleep.    Marland Kitchen zolpidem (AMBIEN) 10 MG tablet Take 10 mg by mouth at bedtime as needed for sleep.    . Armodafinil (NUVIGIL) 250 MG tablet Take 250 mg by mouth once a week.     . butalbital-acetaminophen-caffeine (FIORICET, ESGIC) 50-325-40 MG per tablet Take 1 tablet by mouth 2 (two) times daily as needed for headache.     . CLOBETASOL PROPIONATE EX Apply 1 application topically daily as needed (DRY SCALP).    . clotrimazole-betamethasone (LOTRISONE) cream Apply 1 application topically 2 (two) times daily. (Patient not taking: Reported on 10/26/2014) 30 g 0  . famotidine (PEPCID) 20 MG tablet Take 20 mg by mouth as needed.     . fluticasone (CUTIVATE) 0.05 %  cream Apply 1 application topically daily as needed (psoriasis).     . LORazepam (ATIVAN) 0.5 MG tablet Take 1 tablet (0.5 mg total) by mouth every 8 (eight) hours. (Patient not taking: Reported on 12/28/2014) 30 tablet 0  . nystatin-triamcinolone (MYCOLOG II) cream Apply 1 application topically daily as needed.     . ondansetron (ZOFRAN) 8 MG tablet Take 1 tablet (8 mg total) by mouth 2 (two) times daily. Start the day after chemo for 2 days. Then take as needed for nausea or vomiting. (Patient not taking: Reported on 12/21/2014) 30 tablet 1  . prochlorperazine (COMPAZINE) 10 MG tablet Take 1 tablet (10 mg total) by mouth every 6 (six) hours as needed (Nausea or vomiting). (Patient not taking: Reported on 11/16/2014) 30 tablet 1   No current facility-administered medications for this visit.   Facility-Administered Medications Ordered in Other Visits  Medication Dose Route Frequency Provider Last Rate Last Dose  . sodium chloride 0.9 % injection 10 mL  10 mL Intracatheter PRN Chauncey Cruel, MD   10 mL at 11/30/14 1339    OBJECTIVE:  Middle-aged white woman who appears stated age 59 Vitals:   12/28/14 0951  BP:  113/82  Pulse: 131  Temp: 98.5 F (36.9 C)  Resp: 20     Body mass index is 30.61 kg/(m^2).    ECOG FS:1 - Symptomatic but completely ambulatory  Sclerae unicteric, pupils equal and reactive Oropharynx clear and moist-- no thrush No cervical or supraclavicular adenopathy Lungs no rales or rhonchi Heart regular rate and rhythm Abd soft, nontender, positive bowel sounds MSK no focal spinal tenderness, no upper extremity lymphedema Neuro: nonfocal, well oriented, appropriate affect Breasts: deferred  LAB RESULTS:  CMP     Component Value Date/Time   NA 139 12/21/2014 0924   NA 143 06/17/2009 1137   K 4.0 12/21/2014 0924   K 4.7 06/17/2009 1137   CL 114* 06/17/2009 1137   CO2 26 12/21/2014 0924   CO2 24 06/17/2009 1137   GLUCOSE 98 12/21/2014 0924   GLUCOSE 97 06/17/2009 1137   GLUCOSE 81 10/22/2006 1253   BUN 12.7 12/21/2014 0924   BUN 16 06/17/2009 1137   CREATININE 0.8 12/21/2014 0924   CREATININE 1.2 06/17/2009 1137   CALCIUM 9.2 12/21/2014 0924   CALCIUM 9.4 06/17/2009 1137   PROT 6.0* 12/21/2014 0924   PROT 7.0 06/17/2009 1137   ALBUMIN 3.3* 12/21/2014 0924   ALBUMIN 3.9 06/17/2009 1137   AST 19 12/21/2014 0924   AST 15 06/17/2009 1137   ALT 25 12/21/2014 0924   ALT 13 06/17/2009 1137   ALKPHOS 77 12/21/2014 0924   ALKPHOS 55 06/17/2009 1137   BILITOT 0.22 12/21/2014 0924   BILITOT 0.7 06/17/2009 1137   GFRNONAA 49.82 06/17/2009 1137    I No results found for: SPEP  Lab Results  Component Value Date   WBC 3.1* 12/28/2014   NEUTROABS 2.1 12/28/2014   HGB 11.1* 12/28/2014   HCT 33.2* 12/28/2014   MCV 101.2* 12/28/2014   PLT 143* 12/28/2014      Chemistry      Component Value Date/Time   NA 139 12/21/2014 0924   NA 143 06/17/2009 1137   K 4.0 12/21/2014 0924   K 4.7 06/17/2009 1137   CL 114* 06/17/2009 1137   CO2 26 12/21/2014 0924   CO2 24 06/17/2009 1137   BUN 12.7 12/21/2014 0924   BUN 16 06/17/2009 1137   CREATININE 0.8 12/21/2014 1191  CREATININE 1.2 06/17/2009 1137      Component Value Date/Time   CALCIUM 9.2 12/21/2014 0924   CALCIUM 9.4 06/17/2009 1137   ALKPHOS 77 12/21/2014 0924   ALKPHOS 55 06/17/2009 1137   AST 19 12/21/2014 0924   AST 15 06/17/2009 1137   ALT 25 12/21/2014 0924   ALT 13 06/17/2009 1137   BILITOT 0.22 12/21/2014 0924   BILITOT 0.7 06/17/2009 1137      No results found for: LABCA2  No components found for: LABCA125  No results for input(s): INR in the last 168 hours.  Urinalysis No results found for: COLORURINE  STUDIES: No results found.  ASSESSMENT: 59 y.o. [place] woman status post right breast upper inner quadrant biopsy 08/04/2014 for a clinical T2 N0, stage IIA invasive ductal carcinoma, grade 3, triple negative, with an MIB-1 of 90%.  1. neoadjuvant chemotherapy with doxorubicin and cyclophosphamide dose dense x4 completed on 10/05/14; followed by paclitaxel weekly x12 started 10/19/2014. Carboplatin added for cycles 6-12.  2. Surgery to follow chemotherapy  3. Radiation to follow surgery  PLAN: Ilona is doing fairly well today. The labs were reviewed in detail and were completely stable. She will proceed with cycle 11 of paclitaxel as planned. She will also receive IV fluids during treatment to bring her heart rate down and rehydration.  Marian will return next week for labs, a follow up visit, and her 12th and final cycle of treatment. She will have an MRI on 3/3 and follows up with Dr. Excell Seltzer on 3/11. She understands and agrees with this plan. She knows the goal of treatment in her case is cure. She has been encouraged to call with any issues that might arise before her next visit here.   Marcelino Duster, [place] [phone] 12/28/2014 10:19 AM

## 2014-12-28 NOTE — Patient Instructions (Signed)
[facility] Discharge Instructions for Patients Receiving Chemotherapy  Today you received the following chemotherapy agents taxol/carboplatin  To help prevent nausea and vomiting after your treatment, we encourage you to take your nausea medication as directed   If you develop nausea and vomiting that is not controlled by your nausea medication, call the clinic.   BELOW ARE SYMPTOMS THAT SHOULD BE REPORTED IMMEDIATELY:  *FEVER GREATER THAN 100.5 F  *CHILLS WITH OR WITHOUT FEVER  NAUSEA AND VOMITING THAT IS NOT CONTROLLED WITH YOUR NAUSEA MEDICATION  *UNUSUAL SHORTNESS OF BREATH  *UNUSUAL BRUISING OR BLEEDING  TENDERNESS IN MOUTH AND THROAT WITH OR WITHOUT PRESENCE OF ULCERS  *URINARY PROBLEMS  *BOWEL PROBLEMS  UNUSUAL RASH Items with * indicate a potential emergency and should be followed up as soon as possible.  Feel free to call the clinic you have any questions or concerns. The clinic phone number is [phone].  

## 2014-12-29 ENCOUNTER — Encounter: Payer: Self-pay | Admitting: *Deleted

## 2014-12-29 NOTE — Progress Notes (Signed)
[place] Work  Clinical Social Work was referred by patient for assessment of psychosocial needs due financial concerns.  Clinical Social Worker spoke with patient over the phone to offer support and assess for needs.  Pt has been seen by Melissa Garza and is receiving grant funds to assist her. CSW educated pt about additional resource options, such as [facility] in [place]. Pt appears to qualify and plans to meet with CSW on Friday for additional assistance.   Clinical Social Work interventions: Resource education   Melissa Garza, [place] Worker [place]  [place] Phone: [phone] Fax: [phone]

## 2015-01-04 ENCOUNTER — Ambulatory Visit (HOSPITAL_BASED_OUTPATIENT_CLINIC_OR_DEPARTMENT_OTHER): Payer: 59 | Admitting: Nurse Practitioner

## 2015-01-04 ENCOUNTER — Ambulatory Visit (HOSPITAL_BASED_OUTPATIENT_CLINIC_OR_DEPARTMENT_OTHER): Payer: 59

## 2015-01-04 ENCOUNTER — Other Ambulatory Visit (HOSPITAL_BASED_OUTPATIENT_CLINIC_OR_DEPARTMENT_OTHER): Payer: 59

## 2015-01-04 ENCOUNTER — Encounter: Payer: Self-pay | Admitting: Nurse Practitioner

## 2015-01-04 VITALS — BP 116/87 | HR 129 | Temp 97.8°F | Resp 18 | Ht 60.0 in | Wt 157.4 lb

## 2015-01-04 DIAGNOSIS — C50211 Malignant neoplasm of upper-inner quadrant of right female breast: Secondary | ICD-10-CM

## 2015-01-04 DIAGNOSIS — Z5111 Encounter for antineoplastic chemotherapy: Secondary | ICD-10-CM

## 2015-01-04 DIAGNOSIS — Z171 Estrogen receptor negative status [ER-]: Secondary | ICD-10-CM

## 2015-01-04 LAB — CBC WITH DIFFERENTIAL/PLATELET
BASO%: 0.9 % (ref 0.0–2.0)
BASOS ABS: 0 10*3/uL (ref 0.0–0.1)
EOS%: 1.2 % (ref 0.0–7.0)
Eosinophils Absolute: 0 10*3/uL (ref 0.0–0.5)
HEMATOCRIT: 34.1 % — AB (ref 34.8–46.6)
HEMOGLOBIN: 11.2 g/dL — AB (ref 11.6–15.9)
LYMPH%: 23.1 % (ref 14.0–49.7)
MCH: 33.4 pg (ref 25.1–34.0)
MCHC: 32.7 g/dL (ref 31.5–36.0)
MCV: 102.1 fL — AB (ref 79.5–101.0)
MONO#: 0.2 10*3/uL (ref 0.1–0.9)
MONO%: 7.7 % (ref 0.0–14.0)
NEUT#: 2.1 10*3/uL (ref 1.5–6.5)
NEUT%: 67.1 % (ref 38.4–76.8)
Platelets: 146 10*3/uL (ref 145–400)
RBC: 3.34 10*6/uL — ABNORMAL LOW (ref 3.70–5.45)
RDW: 16.1 % — ABNORMAL HIGH (ref 11.2–14.5)
WBC: 3.2 10*3/uL — ABNORMAL LOW (ref 3.9–10.3)
lymph#: 0.7 10*3/uL — ABNORMAL LOW (ref 0.9–3.3)

## 2015-01-04 LAB — COMPREHENSIVE METABOLIC PANEL (CC13)
ALBUMIN: 3.2 g/dL — AB (ref 3.5–5.0)
ALK PHOS: 69 U/L (ref 40–150)
ALT: 19 U/L (ref 0–55)
AST: 17 U/L (ref 5–34)
Anion Gap: 8 mEq/L (ref 3–11)
BUN: 15 mg/dL (ref 7.0–26.0)
CHLORIDE: 105 meq/L (ref 98–109)
CO2: 25 mEq/L (ref 22–29)
Calcium: 9.1 mg/dL (ref 8.4–10.4)
Creatinine: 0.8 mg/dL (ref 0.6–1.1)
EGFR: 81 mL/min/{1.73_m2} — AB (ref >90)
Glucose: 99 mg/dl (ref 70–140)
Potassium: 4.2 mEq/L (ref 3.5–5.1)
SODIUM: 138 meq/L (ref 136–145)
TOTAL PROTEIN: 5.8 g/dL — AB (ref 6.4–8.3)
Total Bilirubin: 0.25 mg/dL (ref 0.20–1.20)

## 2015-01-04 MED ORDER — HEPARIN SOD (PORK) LOCK FLUSH 100 UNIT/ML IV SOLN
500.0000 [IU] | Freq: Once | INTRAVENOUS | Status: AC | PRN
  Administered 2015-01-04: 500 [IU]
  Filled 2015-01-04: qty 5

## 2015-01-04 MED ORDER — DEXAMETHASONE SODIUM PHOSPHATE 10 MG/ML IJ SOLN
4.0000 mg | Freq: Once | INTRAMUSCULAR | Status: AC
  Administered 2015-01-04: 4 mg via INTRAVENOUS

## 2015-01-04 MED ORDER — SODIUM CHLORIDE 0.9 % IV SOLN
Freq: Once | INTRAVENOUS | Status: AC
  Administered 2015-01-04: 11:00:00 via INTRAVENOUS

## 2015-01-04 MED ORDER — FAMOTIDINE IN NACL 20-0.9 MG/50ML-% IV SOLN
20.0000 mg | Freq: Once | INTRAVENOUS | Status: AC
  Administered 2015-01-04: 20 mg via INTRAVENOUS

## 2015-01-04 MED ORDER — SODIUM CHLORIDE 0.9 % IJ SOLN
10.0000 mL | INTRAMUSCULAR | Status: DC | PRN
  Administered 2015-01-04: 10 mL
  Filled 2015-01-04: qty 10

## 2015-01-04 MED ORDER — SODIUM CHLORIDE 0.9 % IV SOLN
206.8000 mg | Freq: Once | INTRAVENOUS | Status: AC
  Administered 2015-01-04: 210 mg via INTRAVENOUS
  Filled 2015-01-04: qty 21

## 2015-01-04 MED ORDER — DEXAMETHASONE SODIUM PHOSPHATE 10 MG/ML IJ SOLN
INTRAMUSCULAR | Status: AC
  Filled 2015-01-04: qty 1

## 2015-01-04 MED ORDER — FAMOTIDINE IN NACL 20-0.9 MG/50ML-% IV SOLN
INTRAVENOUS | Status: AC
  Filled 2015-01-04: qty 50

## 2015-01-04 MED ORDER — ONDANSETRON 8 MG/50ML IVPB (CHCC)
8.0000 mg | Freq: Once | INTRAVENOUS | Status: AC
  Administered 2015-01-04: 8 mg via INTRAVENOUS

## 2015-01-04 MED ORDER — DIPHENHYDRAMINE HCL 50 MG/ML IJ SOLN
25.0000 mg | Freq: Once | INTRAMUSCULAR | Status: AC
  Administered 2015-01-04: 25 mg via INTRAVENOUS

## 2015-01-04 MED ORDER — ONDANSETRON 8 MG/NS 50 ML IVPB
INTRAVENOUS | Status: AC
  Filled 2015-01-04: qty 8

## 2015-01-04 MED ORDER — PACLITAXEL CHEMO INJECTION 300 MG/50ML
80.0000 mg/m2 | Freq: Once | INTRAVENOUS | Status: AC
  Administered 2015-01-04: 132 mg via INTRAVENOUS
  Filled 2015-01-04: qty 22

## 2015-01-04 MED ORDER — DIPHENHYDRAMINE HCL 50 MG/ML IJ SOLN
INTRAMUSCULAR | Status: AC
  Filled 2015-01-04: qty 1

## 2015-01-04 NOTE — Progress Notes (Signed)
[place]  [phone] [phone]     ID: Melissa Garza DOB: Jul 13, 1956  MR#: [id number]  XNA#:[id number]  Patient Care Team: No Pcp Per Patient as PCP - General (General Practice) Gus Height, MD as Consulting Physician (Obstetrics and Gynecology) Excell Seltzer, MD as Consulting Physician (General Surgery) Chauncey Cruel, MD as Consulting Physician (Oncology) Rexene Edison, MD as Consulting Physician (Radiation Oncology) OTHER MD: Chucky May MD  CHIEF COMPLAINT: Triple negative breast cancer CURRENT TREATMENT: Neoadjuvant chemotherapy  BREAST CANCER HISTORY: From the original intake note:  Cary herself palpated a mass in her right breast approximately mid-September. She brought this to her gynecologist attention and he set her up for bilateral diagnostic mammography at the breast center 08/04/2014. The breast density was category C. in the patient has bilateral saline implants in place. In the palpable area of concern in the right breast there was an irregular mass measuring up to 2.5 cm. By palpation this was firm at the 12:30 o'clock position. Ultrasound of the right breast confirmed an irregular hypoechoic mass in this area measuring 2.5 cm. There was no right axillary lymphadenopathy noted.  Biopsy of the mass in question 08/04/2014 showed ([id number]) invasive ductal carcinoma, grade 3, triple negative, with an MIB-1 of 90%.  Bilateral breast MRI 08/09/2014 showed in the upper inner quadrant of the right breast an irregular enhancing mass measuring 2.8 cm. There was a 4 mm satellite nodule anterior to this and separated from that by 0.6 cm. The left breast, the remaining of the right breast and the lymph node areas were negative.  The patient's subsequent history is as detailed below  INTERVAL HISTORY: Melissa Garza returns today for follow up of her breast cancer. Today is day 1, cycle 12 of 12 planned cycles of weekly paclitaxel  with carboplatin added weekly for cycles 6-12. The right breast mass previously easily palpable, is unable to be located now.   REVIEW OF SYSTEMS: Shamonica denies fevers, chills, nausea or vomiting. She deals with constipation off and on using stool softeners. Her appetite is fair and she has increased her fluid intake. She gets full quickly and has to eat small meals. She continues to have mild dull headaches, but they are not even bad enough to try tylenol. Her hot flashes have improved with gabapentin use. She denies mouth sores or rashes. She still has some fleeting numbness to her left hand for a day after treatment, but then it resolves on her own. This is no worse this week She denies shortness of breath, chest pain, cough, or palpitations. Her main complaint is fatigue, and lack of sleep at night. She uses trazodone or ambien nightly.  A detailed review of systems is otherwise negative.   PAST MEDICAL HISTORY: Past Medical History  Diagnosis Date  . Glaucoma   . Anxiety   . Hot flashes   . Depression   . PONV (postoperative nausea and vomiting)   . Headache     chronic  . Shortness of breath     with exertion  . GERD (gastroesophageal reflux disease)     PMH  . Cancer     cancer of right breast  . Hepatitis     Hx: of Hepatitis not sure which one    PAST SURGICAL HISTORY: Past Surgical History  Procedure Laterality Date  . Tonsilectomy/adenoidectomy with myringotomy    . Breast enhancement surgery  2008  . Eye surgery      Lasik  .  Colonoscopy    . Breast surgery      Biopsy  . Dilation and curettage of uterus    . Tubal ligation    . Portacath placement N/A 08/13/2014    Procedure: INSERTION PORT-A-CATH;  Surgeon: Excell Seltzer, MD;  Location: [facility];  Service: General;  Laterality: N/A;    FAMILY HISTORY Family History  Problem Relation Age of Onset  . Psoriasis Mother   . Hypertension Father   . Asthma Other   . Thyroid disease Other    The patient's parents  are currently alive, in their late 60s. The patient had no brothers, 2 sisters. There is no history of breast or ovarian cancer in the family to her knowledge.   GYNECOLOGIC HISTORY:  No LMP recorded. Patient is postmenopausal. Menarche age 3, first live birth age 53. She is GX P2. She stopped having periods in 2005 and started hormone replacement at that time. 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 Her husband, [name], is disabled secondary to multiple cardiac problems. Son [name] lives in [place] and works in apartment maintenance. Son [name] is his wife) works as an Clinical biochemist. The patient has 6 grandchildren. She is not a Ambulance person.   ADVANCED DIRECTIVES: Not in place   HEALTH MAINTENANCE: History  Substance Use Topics  . Smoking status: Never Smoker   . Smokeless tobacco: Never Used  . Alcohol Use: No     Colonoscopy:Remote  PAP:2014  Bone density:Never  Lipid panel:  Allergies  Allergen Reactions  . Hydrocodone Hives and Itching    Current Outpatient Prescriptions  Medication Sig Dispense Refill  . ALPRAZolam (XANAX) 1 MG tablet Take 1 mg by mouth 4 (four) times daily as needed for anxiety.    . famotidine (PEPCID) 20 MG tablet Take 20 mg by mouth as needed.     Marland Kitchen FLUoxetine (PROZAC) 40 MG capsule Take 40 mg by mouth daily.    Marland Kitchen gabapentin (NEURONTIN) 100 MG capsule Take 1 capsule (100 mg total) by mouth daily. 60 capsule 2  . gabapentin (NEURONTIN) 300 MG capsule Take 1 capsule (300 mg total) by mouth at bedtime. 90 capsule 2  . lidocaine-prilocaine (EMLA) cream Apply 1 application topically as needed. Apply to portacath  1 1/2 - 2 hours as needed prior to procedures. 30 g 0  . LORazepam (ATIVAN) 0.5 MG tablet Take 1 tablet (0.5 mg total) by mouth every 8 (eight) hours. 30 tablet 0  . methylphenidate 54 MG PO CR  tablet Take 54 mg by mouth every morning.    . Polyvinyl Alcohol-Povidone (REFRESH OP) Place 2 drops into both eyes daily as needed (ALLERGIES).    Marland Kitchen travoprost, benzalkonium, (TRAVATAN) 0.004 % ophthalmic solution Place 1 drop into both eyes at bedtime.     . traZODone (DESYREL) 50 MG tablet Take 50 mg by mouth at bedtime as needed for sleep.    Marland Kitchen zolpidem (AMBIEN) 10 MG tablet Take 10 mg by mouth at bedtime as needed for sleep.    Marland Kitchen acetaminophen (TYLENOL) 500 MG tablet Take 1,000 mg by mouth every 6 (six) hours as needed for mild pain.    . Armodafinil (NUVIGIL) 250 MG tablet Take 250 mg by mouth once a week.     . butalbital-acetaminophen-caffeine (FIORICET, ESGIC) 50-325-40 MG per tablet Take 1 tablet by mouth 2 (two)  times daily as needed for headache.     . CLOBETASOL PROPIONATE EX Apply 1 application topically daily as needed (DRY SCALP).    . clotrimazole-betamethasone (LOTRISONE) cream Apply 1 application topically 2 (two) times daily. (Patient not taking: Reported on 10/26/2014) 30 g 0  . fluticasone (CUTIVATE) 0.05 % cream Apply 1 application topically daily as needed (psoriasis).     . nystatin-triamcinolone (MYCOLOG II) cream Apply 1 application topically daily as needed.     . ondansetron (ZOFRAN) 8 MG tablet Take 1 tablet (8 mg total) by mouth 2 (two) times daily. Start the day after chemo for 2 days. Then take as needed for nausea or vomiting. (Patient not taking: Reported on 01/04/2015) 30 tablet 1  . prochlorperazine (COMPAZINE) 10 MG tablet Take 1 tablet (10 mg total) by mouth every 6 (six) hours as needed (Nausea or vomiting). (Patient not taking: Reported on 11/16/2014) 30 tablet 1   No current facility-administered medications for this visit.   Facility-Administered Medications Ordered in Other Visits  Medication Dose Route Frequency Provider Last Rate Last Dose  . sodium chloride 0.9 % injection 10 mL  10 mL Intracatheter PRN Chauncey Cruel, MD   10 mL at 11/30/14 1339     OBJECTIVE:  Middle-aged white woman who appears stated age 59 Vitals:   01/04/15 1007  BP: 116/87  Pulse: 129  Temp: 97.8 F (36.6 C)  Resp: 18     Body mass index is 30.74 kg/(m^2).    ECOG FS:1 - Symptomatic but completely ambulatory  Skin: warm, dry  HEENT: sclerae anicteric, conjunctivae pink, oropharynx clear. No thrush or mucositis.  Lymph Nodes: No cervical or supraclavicular lymphadenopathy  Lungs: clear to auscultation bilaterally, no rales, wheezes, or rhonci  Heart: regular rate and rhythm  Abdomen: round, soft, non tender, positive bowel sounds  Musculoskeletal: No focal spinal tenderness, no peripheral edema  Neuro: non focal, well oriented, positive affect  Breasts: deferred  LAB RESULTS:  CMP     Component Value Date/Time   NA 138 01/04/2015 0908   NA 143 06/17/2009 1137   K 4.2 01/04/2015 0908   K 4.7 06/17/2009 1137   CL 114* 06/17/2009 1137   CO2 25 01/04/2015 0908   CO2 24 06/17/2009 1137   GLUCOSE 99 01/04/2015 0908   GLUCOSE 97 06/17/2009 1137   GLUCOSE 81 10/22/2006 1253   BUN 15.0 01/04/2015 0908   BUN 16 06/17/2009 1137   CREATININE 0.8 01/04/2015 0908   CREATININE 1.2 06/17/2009 1137   CALCIUM 9.1 01/04/2015 0908   CALCIUM 9.4 06/17/2009 1137   PROT 5.8* 01/04/2015 0908   PROT 7.0 06/17/2009 1137   ALBUMIN 3.2* 01/04/2015 0908   ALBUMIN 3.9 06/17/2009 1137   AST 17 01/04/2015 0908   AST 15 06/17/2009 1137   ALT 19 01/04/2015 0908   ALT 13 06/17/2009 1137   ALKPHOS 69 01/04/2015 0908   ALKPHOS 55 06/17/2009 1137   BILITOT 0.25 01/04/2015 0908   BILITOT 0.7 06/17/2009 1137   GFRNONAA 49.82 06/17/2009 1137    I No results found for: SPEP  Lab Results  Component Value Date   WBC 3.2* 01/04/2015   NEUTROABS 2.1 01/04/2015   HGB 11.2* 01/04/2015   HCT 34.1* 01/04/2015   MCV 102.1* 01/04/2015   PLT 146 01/04/2015      Chemistry      Component Value Date/Time   NA 138 01/04/2015 0908   NA 143 06/17/2009 1137   K 4.2  01/04/2015 0908  K 4.7 06/17/2009 1137   CL 114* 06/17/2009 1137   CO2 25 01/04/2015 0908   CO2 24 06/17/2009 1137   BUN 15.0 01/04/2015 0908   BUN 16 06/17/2009 1137   CREATININE 0.8 01/04/2015 0908   CREATININE 1.2 06/17/2009 1137      Component Value Date/Time   CALCIUM 9.1 01/04/2015 0908   CALCIUM 9.4 06/17/2009 1137   ALKPHOS 69 01/04/2015 0908   ALKPHOS 55 06/17/2009 1137   AST 17 01/04/2015 0908   AST 15 06/17/2009 1137   ALT 19 01/04/2015 0908   ALT 13 06/17/2009 1137   BILITOT 0.25 01/04/2015 0908   BILITOT 0.7 06/17/2009 1137      No results found for: LABCA2  No components found for: LABCA125  No results for input(s): INR in the last 168 hours.  Urinalysis No results found for: COLORURINE  STUDIES: No results found.  ASSESSMENT: 59 y.o. [place] woman status post right breast upper inner quadrant biopsy 08/04/2014 for a clinical T2 N0, stage IIA invasive ductal carcinoma, grade 3, triple negative, with an MIB-1 of 90%.  1. neoadjuvant chemotherapy with doxorubicin and cyclophosphamide dose dense x4 completed on 10/05/14; followed by paclitaxel weekly x12 started 10/19/2014. Carboplatin added for cycles 6-12, completed 01/04/15  2. Surgery to follow chemotherapy  3. Radiation to follow surgery  PLAN: Halley is excited to be completing chemotherapy today. The labs were reviewed in detail and were completely stable. She will proceed with her 12th and final cycle of paclitaxel as planned.   Lulie will have a breast MRI on Friday of this week. She will return next week for follow up with Dr. Jana Hakim, and will meet with Dr. Excell Seltzer later in the week to discuss her upcoming surgery. After surgery she will proceed to radiation. She understands and agrees with this plan. She knows the goal of treatment in her case is cure. She has been encouraged to call with any issues that might arise before her next visit here.  Marcelino Duster, [place] [phone] 01/04/2015 10:10 AM

## 2015-01-04 NOTE — Patient Instructions (Signed)
[facility] Discharge Instructions for Patients Receiving Chemotherapy  Today you received the following chemotherapy agents: Taxol and Carbo platin  To help prevent nausea and vomiting after your treatment, we encourage you to take your nausea medication: compazine 10 mg every 6 hours as needed.   If you develop nausea and vomiting that is not controlled by your nausea medication, call the clinic.   BELOW ARE SYMPTOMS THAT SHOULD BE REPORTED IMMEDIATELY:  *FEVER GREATER THAN 100.5 F  *CHILLS WITH OR WITHOUT FEVER  NAUSEA AND VOMITING THAT IS NOT CONTROLLED WITH YOUR NAUSEA MEDICATION  *UNUSUAL SHORTNESS OF BREATH  *UNUSUAL BRUISING OR BLEEDING  TENDERNESS IN MOUTH AND THROAT WITH OR WITHOUT PRESENCE OF ULCERS  *URINARY PROBLEMS  *BOWEL PROBLEMS  UNUSUAL RASH Items with * indicate a potential emergency and should be followed up as soon as possible.  Feel free to call the clinic you have any questions or concerns. The clinic phone number is [phone].    

## 2015-01-06 ENCOUNTER — Other Ambulatory Visit: Payer: Self-pay | Admitting: Nurse Practitioner

## 2015-01-06 ENCOUNTER — Inpatient Hospital Stay (HOSPITAL_COMMUNITY)
Admission: RE | Admit: 2015-01-06 | Discharge: 2015-01-06 | Disposition: A | Discharge status: Home / Self Care | Payer: 59 | Source: Ambulatory Visit | Attending: Nurse Practitioner | Admitting: Nurse Practitioner

## 2015-01-06 ENCOUNTER — Ambulatory Visit (HOSPITAL_COMMUNITY)
Admission: RE | Admit: 2015-01-06 | Discharge: 2015-01-06 | Disposition: A | Discharge status: Home / Self Care | Payer: 59 | Source: Ambulatory Visit | Attending: Nurse Practitioner | Admitting: Nurse Practitioner

## 2015-01-06 DIAGNOSIS — C50211 Malignant neoplasm of upper-inner quadrant of right female breast: Secondary | ICD-10-CM

## 2015-01-07 ENCOUNTER — Ambulatory Visit (HOSPITAL_COMMUNITY)
Admission: RE | Admit: 2015-01-07 | Discharge: 2015-01-07 | Disposition: A | Discharge status: Home / Self Care | Payer: 59 | Source: Ambulatory Visit | Attending: Nurse Practitioner | Admitting: Nurse Practitioner

## 2015-01-07 DIAGNOSIS — C50211 Malignant neoplasm of upper-inner quadrant of right female breast: Secondary | ICD-10-CM | POA: Diagnosis present

## 2015-01-07 DIAGNOSIS — Z79899 Other long term (current) drug therapy: Secondary | ICD-10-CM | POA: Insufficient documentation

## 2015-01-07 MED ORDER — GADOBENATE DIMEGLUMINE 529 MG/ML IV SOLN
15.0000 mL | Freq: Once | INTRAVENOUS | Status: AC | PRN
  Administered 2015-01-07: 15 mL via INTRAVENOUS

## 2015-01-10 ENCOUNTER — Encounter: Payer: Self-pay | Admitting: Oncology

## 2015-01-10 NOTE — Progress Notes (Signed)
Sent short term disability forms to [employer] [phone] claim#[id number]. I sent labs and office notes from 11/29/14-present.

## 2015-01-11 ENCOUNTER — Other Ambulatory Visit: Payer: Self-pay | Admitting: *Deleted

## 2015-01-11 ENCOUNTER — Ambulatory Visit (HOSPITAL_BASED_OUTPATIENT_CLINIC_OR_DEPARTMENT_OTHER): Payer: 59 | Admitting: Oncology

## 2015-01-11 ENCOUNTER — Other Ambulatory Visit (HOSPITAL_BASED_OUTPATIENT_CLINIC_OR_DEPARTMENT_OTHER): Payer: 59

## 2015-01-11 VITALS — BP 127/76 | HR 126 | Temp 98.2°F | Resp 19 | Ht 60.0 in | Wt 161.8 lb

## 2015-01-11 DIAGNOSIS — C50211 Malignant neoplasm of upper-inner quadrant of right female breast: Secondary | ICD-10-CM

## 2015-01-11 DIAGNOSIS — D630 Anemia in neoplastic disease: Secondary | ICD-10-CM

## 2015-01-11 DIAGNOSIS — D701 Agranulocytosis secondary to cancer chemotherapy: Secondary | ICD-10-CM

## 2015-01-11 DIAGNOSIS — T451X5A Adverse effect of antineoplastic and immunosuppressive drugs, initial encounter: Secondary | ICD-10-CM

## 2015-01-11 LAB — CBC WITH DIFFERENTIAL/PLATELET
BASO%: 0.9 % (ref 0.0–2.0)
BASOS ABS: 0 10*3/uL (ref 0.0–0.1)
EOS ABS: 0 10*3/uL (ref 0.0–0.5)
EOS%: 1.4 % (ref 0.0–7.0)
HEMATOCRIT: 32.1 % — AB (ref 34.8–46.6)
HEMOGLOBIN: 10.8 g/dL — AB (ref 11.6–15.9)
LYMPH#: 0.7 10*3/uL — AB (ref 0.9–3.3)
LYMPH%: 30.6 % (ref 14.0–49.7)
MCH: 34.1 pg — ABNORMAL HIGH (ref 25.1–34.0)
MCHC: 33.6 g/dL (ref 31.5–36.0)
MCV: 101.3 fL — ABNORMAL HIGH (ref 79.5–101.0)
MONO#: 0.1 10*3/uL (ref 0.1–0.9)
MONO%: 6.5 % (ref 0.0–14.0)
NEUT%: 60.6 % (ref 38.4–76.8)
NEUTROS ABS: 1.3 10*3/uL — AB (ref 1.5–6.5)
Platelets: 121 10*3/uL — ABNORMAL LOW (ref 145–400)
RBC: 3.17 10*6/uL — ABNORMAL LOW (ref 3.70–5.45)
RDW: 15.1 % — ABNORMAL HIGH (ref 11.2–14.5)
WBC: 2.2 10*3/uL — ABNORMAL LOW (ref 3.9–10.3)

## 2015-01-11 LAB — COMPREHENSIVE METABOLIC PANEL (CC13)
ALT: 25 U/L (ref 0–55)
AST: 21 U/L (ref 5–34)
Albumin: 3 g/dL — ABNORMAL LOW (ref 3.5–5.0)
Alkaline Phosphatase: 66 U/L (ref 40–150)
Anion Gap: 7 mEq/L (ref 3–11)
BUN: 12.6 mg/dL (ref 7.0–26.0)
CO2: 25 mEq/L (ref 22–29)
Calcium: 8.6 mg/dL (ref 8.4–10.4)
Chloride: 106 mEq/L (ref 98–109)
Creatinine: 0.8 mg/dL (ref 0.6–1.1)
EGFR: 82 mL/min/{1.73_m2} — ABNORMAL LOW (ref >90)
Glucose: 120 mg/dl (ref 70–140)
Potassium: 3.8 mEq/L (ref 3.5–5.1)
SODIUM: 139 meq/L (ref 136–145)
TOTAL PROTEIN: 5.7 g/dL — AB (ref 6.4–8.3)
Total Bilirubin: 0.26 mg/dL (ref 0.20–1.20)

## 2015-01-11 NOTE — Progress Notes (Signed)
Melissa Garza  Telephone:([phone]) [phone] Fax:(336) [phone]     ID: Melissa Garza DOB: October 06, 1956  MR#: [id number]  HRC#:[id number]  Patient Care Team: No Pcp Per Patient as PCP - General (General Practice) Harle Battiest, MD as Consulting Physician (Obstetrics and Gynecology) Excell Seltzer, MD as Consulting Physician (General Surgery) Chauncey Cruel, MD as Consulting Physician (Oncology) Arloa Koh, MD as Consulting Physician (Radiation Oncology) OTHER MD: Chucky May MD  CHIEF COMPLAINT: Triple negative breast cancer CURRENT TREATMENT: Awaiting definitive surgery  BREAST CANCER HISTORY: From the original intake note:  [name] herself palpated a mass in her right breast approximately mid-September. She brought this to her gynecologist attention and he set her up for bilateral diagnostic mammography at the breast center 08/04/2014. The breast density was category C. in the patient has bilateral saline implants in place. In the palpable area of concern in the right breast there was an irregular mass measuring up to 2.5 cm. By palpation this was firm at the 12:30 o'clock position. Ultrasound of the right breast confirmed an irregular hypoechoic mass in this area measuring 2.5 cm. There was no right axillary lymphadenopathy noted.  Biopsy of the mass in question 08/04/2014 showed ([id number]) invasive ductal carcinoma, grade 3, triple negative, with an MIB-1 of 90%.  Bilateral breast MRI 08/09/2014 showed in the upper inner quadrant of the right breast an irregular enhancing mass measuring 2.8 cm. There was a 4 mm satellite nodule anterior to this and separated from that by 0.6 cm. The left breast, the remaining of the right breast and the lymph node areas were negative.  The patient's subsequent history is as detailed below  INTERVAL HISTORY: [name] returns today for follow up of her breast cancer accompanied by her husband. Today is day 8, cycle 12 of 12 planned  cycles of weekly paclitaxel with carboplatin added weekly for cycles 6-12. She just had a repeat breast MRI which shows a complete radiologic response.  REVIEW OF SYSTEMS: Taneal generally did well with her chemotherapy. Her biggest problem was fatigued. She is still very tired and just walking to the mailbox exhausts her. She had some nausea but no vomiting. She never had fever or bleeding problems. She did have problems with constipation and still feels a bit constipated. She never had mouth sores. She does have a history of glaucoma and it could be that the steroids may be glaucoma a little bit more active. She had to increase her medication. She is having more night sweats, which are better controlled with gabapentin. She still has daytime hot flashes which she was taking gabapentin at a lower dose 4, with very little effect. Her psoriasis did improve. She has back pain, which is fairly chronic. She has rare headaches. She feels anxious and depressed. A detailed review of systems today was otherwise stable  PAST MEDICAL HISTORY: Past Medical History  Diagnosis Date  . Glaucoma   . Anxiety   . Hot flashes   . Depression   . PONV (postoperative nausea and vomiting)   . Headache     chronic  . Shortness of breath     with exertion  . GERD (gastroesophageal reflux disease)     PMH  . Cancer     cancer of right breast  . Hepatitis     Hx: of Hepatitis not sure which one    PAST SURGICAL HISTORY: Past Surgical History  Procedure Laterality Date  . Tonsilectomy/adenoidectomy with myringotomy    . Breast enhancement  surgery  2008  . Eye surgery      Lasik  . Colonoscopy    . Breast surgery      Biopsy  . Dilation and curettage of uterus    . Tubal ligation    . Portacath placement N/A 08/13/2014    Procedure: INSERTION PORT-A-CATH;  Surgeon: Excell Seltzer, MD;  Location: [facility] OR;  Service: General;  Laterality: N/A;    FAMILY HISTORY Family History  Problem Relation Age of  Onset  . Psoriasis Mother   . Hypertension Father   . Asthma Other   . Thyroid disease Other    The patient's parents are currently alive, in their late 28s. The patient had no brothers, 2 sisters. There is no history of breast or ovarian cancer in the family to her knowledge.   GYNECOLOGIC HISTORY:  No LMP recorded. Patient is postmenopausal. Menarche age 57, first live birth age 52. She is GX P2. She stopped having periods in 2005 and started hormone replacement at that time. 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 Her husband, [name], is disabled secondary to multiple cardiac problems. Son [name] lives in [place] and works in apartment maintenance. Son [name] is his wife) works as an Clinical biochemist. The patient has 6 grandchildren. She is not a Ambulance person.   ADVANCED DIRECTIVES: Not in place   HEALTH MAINTENANCE: History  Substance Use Topics  . Smoking status: Never Smoker   . Smokeless tobacco: Never Used  . Alcohol Use: No     Colonoscopy:Remote  PAP:2014  Bone density:Never  Lipid panel:  Allergies  Allergen Reactions  . Hydrocodone Hives and Itching    Current Outpatient Prescriptions  Medication Sig Dispense Refill  . acetaminophen (TYLENOL) 500 MG tablet Take 1,000 mg by mouth every 6 (six) hours as needed for mild pain.    Marland Kitchen ALPRAZolam (XANAX) 1 MG tablet Take 1 mg by mouth 4 (four) times daily as needed for anxiety.    . Armodafinil (NUVIGIL) 250 MG tablet Take 250 mg by mouth once a week.     . butalbital-acetaminophen-caffeine (FIORICET, ESGIC) 50-325-40 MG per tablet Take 1 tablet by mouth 2 (two) times daily as needed for headache.     . CLOBETASOL PROPIONATE EX Apply 1 application topically daily as needed (DRY SCALP).    . clotrimazole-betamethasone (LOTRISONE) cream Apply 1 application topically 2 (two)  times daily. (Patient not taking: Reported on 10/26/2014) 30 g 0  . famotidine (PEPCID) 20 MG tablet Take 20 mg by mouth as needed.     Marland Kitchen FLUoxetine (PROZAC) 40 MG capsule Take 40 mg by mouth daily.    . fluticasone (CUTIVATE) 0.05 % cream Apply 1 application topically daily as needed (psoriasis).     . gabapentin (NEURONTIN) 100 MG capsule Take 1 capsule (100 mg total) by mouth daily. 60 capsule 2  . gabapentin (NEURONTIN) 300 MG capsule Take 1 capsule (300 mg total) by mouth at bedtime. 90 capsule 2  . lidocaine-prilocaine (EMLA) cream Apply 1 application topically as needed. Apply to portacath  1 1/2 - 2 hours as needed prior to procedures. 30 g 0  . LORazepam (ATIVAN) 0.5 MG tablet Take 1 tablet (0.5 mg total) by mouth every 8 (eight) hours. 30 tablet 0  . methylphenidate 54 MG PO CR tablet Take 54 mg by mouth every morning.    Marland Kitchen  nystatin-triamcinolone (MYCOLOG II) cream Apply 1 application topically daily as needed.     . ondansetron (ZOFRAN) 8 MG tablet Take 1 tablet (8 mg total) by mouth 2 (two) times daily. Start the day after chemo for 2 days. Then take as needed for nausea or vomiting. (Patient not taking: Reported on 01/04/2015) 30 tablet 1  . Polyvinyl Alcohol-Povidone (REFRESH OP) Place 2 drops into both eyes daily as needed (ALLERGIES).    Marland Kitchen prochlorperazine (COMPAZINE) 10 MG tablet Take 1 tablet (10 mg total) by mouth every 6 (six) hours as needed (Nausea or vomiting). (Patient not taking: Reported on 11/16/2014) 30 tablet 1  . travoprost, benzalkonium, (TRAVATAN) 0.004 % ophthalmic solution Place 1 drop into both eyes at bedtime.     . traZODone (DESYREL) 50 MG tablet Take 50 mg by mouth at bedtime as needed for sleep.    Marland Kitchen zolpidem (AMBIEN) 10 MG tablet Take 10 mg by mouth at bedtime as needed for sleep.     No current facility-administered medications for this visit.   Facility-Administered Medications Ordered in Other Visits  Medication Dose Route Frequency Provider Last Rate Last  Dose  . sodium chloride 0.9 % injection 10 mL  10 mL Intracatheter PRN Chauncey Cruel, MD   10 mL at 11/30/14 1339    OBJECTIVE:  Middle-aged white woman in no acute distress Filed Vitals:   01/11/15 0912  BP: 127/76  Pulse: 126  Temp: 98.2 F (36.8 C)  Resp: 19     Body mass index is 31.6 kg/(m^2).    ECOG FS:1 - Symptomatic but completely ambulatory  Sclerae unicteric, pupils equal and reactive Oropharynx clear and moist-- no thrush or other lesions No cervical or supraclavicular adenopathy Lungs no rales or rhonchi Heart regular rate and rhythm Abd soft, nontender, positive bowel sounds MSK no focal spinal tenderness, no upper extremity lymphedema Neuro: nonfocal, well oriented, anxious affect Breasts: I do not palpate any mass in the right breast and there are no skin or nipple changes of concern per the right axilla is benign. The left breast is unremarkable.   LAB RESULTS:  CMP     Component Value Date/Time   NA 138 01/04/2015 0908   NA 143 06/17/2009 1137   K 4.2 01/04/2015 0908   K 4.7 06/17/2009 1137   CL 114* 06/17/2009 1137   CO2 25 01/04/2015 0908   CO2 24 06/17/2009 1137   GLUCOSE 99 01/04/2015 0908   GLUCOSE 97 06/17/2009 1137   GLUCOSE 81 10/22/2006 1253   BUN 15.0 01/04/2015 0908   BUN 16 06/17/2009 1137   CREATININE 0.8 01/04/2015 0908   CREATININE 1.2 06/17/2009 1137   CALCIUM 9.1 01/04/2015 0908   CALCIUM 9.4 06/17/2009 1137   PROT 5.8* 01/04/2015 0908   PROT 7.0 06/17/2009 1137   ALBUMIN 3.2* 01/04/2015 0908   ALBUMIN 3.9 06/17/2009 1137   AST 17 01/04/2015 0908   AST 15 06/17/2009 1137   ALT 19 01/04/2015 0908   ALT 13 06/17/2009 1137   ALKPHOS 69 01/04/2015 0908   ALKPHOS 55 06/17/2009 1137   BILITOT 0.25 01/04/2015 0908   BILITOT 0.7 06/17/2009 1137   GFRNONAA 49.82 06/17/2009 1137    I No results found for: SPEP  Lab Results  Component Value Date   WBC 2.2* 01/11/2015   NEUTROABS 1.3* 01/11/2015   HGB 10.8* 01/11/2015    HCT 32.1* 01/11/2015   MCV 101.3* 01/11/2015   PLT 121* 01/11/2015      Chemistry  Component Value Date/Time   NA 138 01/04/2015 0908   NA 143 06/17/2009 1137   K 4.2 01/04/2015 0908   K 4.7 06/17/2009 1137   CL 114* 06/17/2009 1137   CO2 25 01/04/2015 0908   CO2 24 06/17/2009 1137   BUN 15.0 01/04/2015 0908   BUN 16 06/17/2009 1137   CREATININE 0.8 01/04/2015 0908   CREATININE 1.2 06/17/2009 1137      Component Value Date/Time   CALCIUM 9.1 01/04/2015 0908   CALCIUM 9.4 06/17/2009 1137   ALKPHOS 69 01/04/2015 0908   ALKPHOS 55 06/17/2009 1137   AST 17 01/04/2015 0908   AST 15 06/17/2009 1137   ALT 19 01/04/2015 0908   ALT 13 06/17/2009 1137   BILITOT 0.25 01/04/2015 0908   BILITOT 0.7 06/17/2009 1137      No results found for: LABCA2  No components found for: LABCA125  No results for input(s): INR in the last 168 hours.  Urinalysis No results found for: COLORURINE  STUDIES: Mr Breast Bilateral W Wo Contrast  01/07/2015   CLINICAL DATA:  Biopsy proven grade 3 invasive ductal carcinoma in the upper inner quadrant of the right breast originally diagnosed in September, 2015. Patient recently completed neoadjuvant chemotherapy. MRI requested to evaluate treatment response. Patient has bilateral subpectoral saline implants.  LABS:  Not applicable.  EXAM: BILATERAL BREAST MRI WITH AND WITHOUT CONTRAST  TECHNIQUE: Multiplanar, multisequence MR images of both breasts were obtained prior to and following the intravenous administration of 15 ml of Multihance.  THREE-DIMENSIONAL MR IMAGE RENDERING ON INDEPENDENT WORKSTATION:  Three-dimensional MR images were rendered by post-processing of the original MR data on an independent workstation. The three-dimensional MR images were interpreted, and findings are reported in the following complete MRI report for this study. Three dimensional images were evaluated at the independent DynaCad workstation.  COMPARISON:  MRI 10/08/2014,  08/09/2014. Right breast ultrasound 08/04/2014. Mammography 08/04/2014 dating back to 01/03/2007.  FINDINGS: The technologist states that the patient had several episodes of coughing during the examination, accounting for motion. However, the study is diagnostic.  Breast composition: b.  Scattered fibroglandular tissue.  Background parenchymal enhancement: Mild.  Right breast: No residual mass or abnormal enhancement in the upper inner quadrant at the site of the biopsy proven malignancy. The previously identified satellite nodule anterolateral to the dominant mass is also no longer visible. No new or suspicious findings elsewhere in the right breast.  Left breast: No mass or abnormal enhancement. Intramammary lymph node in the outer subareolar region demonstrating a normal fatty hilus.  Lymph nodes: No pathologic lymphadenopathy.  Ancillary findings:  None.  IMPRESSION: 1. Interval complete response to neoadjuvant chemotherapy as the mass in the upper inner quadrant of the right breast, biopsy-proven invasive ductal carcinoma and DCIS, has resolved. The adjacent satellite nodule has also resolved. 2. No evidence of malignancy elsewhere in either breast. 3. No pathologic lymphadenopathy.  RECOMMENDATION: Treatment plan.  BI-RADS CATEGORY  6: Known biopsy-proven malignancy.   Electronically Signed   By: Evangeline Dakin M.D.   On: 01/07/2015 14:25   Mr Attempted Daymon Larsen Report  01/06/2015   This examination belongs to an outside facility and is stored  here for comparison purposes only.  Contact the originating outside  institution for any associated report or interpretation.   ASSESSMENT: 59 y.o. [place] woman status post right breast upper inner quadrant biopsy 08/04/2014 for a clinical T2 N0, stage IIA invasive ductal carcinoma, grade 3, triple negative, with an MIB-1 of 90%.  1. neoadjuvant chemotherapy with doxorubicin and cyclophosphamide dose dense x4 completed on 10/05/14; followed by paclitaxel  weekly x12 started 10/19/2014. Carboplatin added for cycles 6-12, completed 01/04/15  2. Surgery to follow chemotherapy  3. Radiation to follow surgery  PLAN: Genelda has completed her neoadjuvant chemotherapy, and has had the best possible radiologic response. In general, a complete radiologic response correspondence to a complete pathologic response with 90% accuracy. This is very hopeful.  She is already scheduled to see Dr. Excell Seltzer later this week. I would think she will have her surgery within the next 2-3 weeks. Accordingly I have made her a return appointment with me and also with [name] for about 5 weeks from now.   She tells me her disability runs out the second week in April. She hopes to be able to get back to work at that time. I think that she should be able to but I did ask her to check with Dr. Excell Seltzer Re: The timing.  I don't think she is benefiting from the daytime gabapentin and that may make her sleepy and slow down her recovery so we are going off that medication. She will continue gabapentin at bedtime, which is working well for her.  I have also encouraged her to start a walking program. Finally I gave her information on the "finding your new normal" program and suggested she sign up for the June or July event.  Alexes has a good understanding of the overall plan. She agrees with it. She knows a goal of treatment in her case is cure. She will call with any problems that may develop before her next visit here.    Chauncey Cruel, [place] [phone] 01/11/2015 9:33 AM

## 2015-01-13 NOTE — Addendum Note (Signed)
Addended by: Laureen Abrahams on: 01/13/2015 03:26 PM   Modules accepted: Orders, Medications

## 2015-01-14 ENCOUNTER — Other Ambulatory Visit (INDEPENDENT_AMBULATORY_CARE_PROVIDER_SITE_OTHER): Payer: Self-pay | Admitting: General Surgery

## 2015-01-14 ENCOUNTER — Telehealth: Payer: Self-pay | Admitting: Oncology

## 2015-01-14 DIAGNOSIS — C50911 Malignant neoplasm of unspecified site of right female breast: Secondary | ICD-10-CM

## 2015-01-14 NOTE — Telephone Encounter (Signed)
pt stopped by to have sch printed for april,calendar printed

## 2015-01-18 ENCOUNTER — Other Ambulatory Visit: Payer: Self-pay | Admitting: *Deleted

## 2015-01-18 ENCOUNTER — Other Ambulatory Visit (INDEPENDENT_AMBULATORY_CARE_PROVIDER_SITE_OTHER): Payer: Self-pay | Admitting: General Surgery

## 2015-01-18 DIAGNOSIS — C50211 Malignant neoplasm of upper-inner quadrant of right female breast: Secondary | ICD-10-CM

## 2015-01-18 DIAGNOSIS — C50911 Malignant neoplasm of unspecified site of right female breast: Secondary | ICD-10-CM

## 2015-01-20 ENCOUNTER — Other Ambulatory Visit: Payer: Self-pay | Admitting: *Deleted

## 2015-01-20 DIAGNOSIS — R21 Rash and other nonspecific skin eruption: Secondary | ICD-10-CM

## 2015-01-20 MED ORDER — CLOTRIMAZOLE-BETAMETHASONE 1-0.05 % EX CREA: 1.0000 "application " | TOPICAL_CREAM | Freq: Two times a day (BID) | CUTANEOUS | Status: DC

## 2015-01-20 NOTE — Telephone Encounter (Signed)
Patient notified

## 2015-01-24 ENCOUNTER — Encounter (HOSPITAL_BASED_OUTPATIENT_CLINIC_OR_DEPARTMENT_OTHER): Payer: Self-pay | Admitting: *Deleted

## 2015-01-24 NOTE — Progress Notes (Signed)
No labs needed

## 2015-01-31 ENCOUNTER — Ambulatory Visit
Admission: RE | Admit: 2015-01-31 | Discharge: 2015-01-31 | Disposition: A | Discharge status: Home / Self Care | Payer: 59 | Source: Ambulatory Visit | Attending: General Surgery | Admitting: General Surgery

## 2015-01-31 DIAGNOSIS — C50911 Malignant neoplasm of unspecified site of right female breast: Secondary | ICD-10-CM

## 2015-02-01 ENCOUNTER — Ambulatory Visit (HOSPITAL_BASED_OUTPATIENT_CLINIC_OR_DEPARTMENT_OTHER): Payer: 59 | Admitting: Anesthesiology

## 2015-02-01 ENCOUNTER — Ambulatory Visit (HOSPITAL_BASED_OUTPATIENT_CLINIC_OR_DEPARTMENT_OTHER)
Admission: RE | Admit: 2015-02-01 | Discharge: 2015-02-01 | Disposition: A | Discharge status: Home / Self Care | Payer: 59 | Source: Ambulatory Visit | Attending: General Surgery | Admitting: General Surgery

## 2015-02-01 ENCOUNTER — Encounter (HOSPITAL_BASED_OUTPATIENT_CLINIC_OR_DEPARTMENT_OTHER)
Admission: RE | Disposition: A | Discharge status: Home / Self Care | Payer: Self-pay | Source: Ambulatory Visit | Attending: General Surgery

## 2015-02-01 ENCOUNTER — Encounter (HOSPITAL_COMMUNITY)
Admission: RE | Admit: 2015-02-01 | Discharge: 2015-02-01 | Disposition: A | Discharge status: Home / Self Care | Payer: 59 | Source: Ambulatory Visit | Attending: General Surgery | Admitting: General Surgery

## 2015-02-01 ENCOUNTER — Ambulatory Visit
Admission: RE | Admit: 2015-02-01 | Discharge: 2015-02-01 | Disposition: A | Discharge status: Home / Self Care | Payer: 59 | Source: Ambulatory Visit | Attending: General Surgery | Admitting: General Surgery

## 2015-02-01 ENCOUNTER — Encounter (HOSPITAL_BASED_OUTPATIENT_CLINIC_OR_DEPARTMENT_OTHER): Payer: Self-pay

## 2015-02-01 DIAGNOSIS — N6011 Diffuse cystic mastopathy of right breast: Secondary | ICD-10-CM | POA: Diagnosis not present

## 2015-02-01 DIAGNOSIS — G43909 Migraine, unspecified, not intractable, without status migrainosus: Secondary | ICD-10-CM | POA: Insufficient documentation

## 2015-02-01 DIAGNOSIS — F329 Major depressive disorder, single episode, unspecified: Secondary | ICD-10-CM | POA: Insufficient documentation

## 2015-02-01 DIAGNOSIS — K219 Gastro-esophageal reflux disease without esophagitis: Secondary | ICD-10-CM | POA: Diagnosis not present

## 2015-02-01 DIAGNOSIS — C50911 Malignant neoplasm of unspecified site of right female breast: Secondary | ICD-10-CM | POA: Insufficient documentation

## 2015-02-01 DIAGNOSIS — Z886 Allergy status to analgesic agent status: Secondary | ICD-10-CM | POA: Diagnosis not present

## 2015-02-01 DIAGNOSIS — F419 Anxiety disorder, unspecified: Secondary | ICD-10-CM | POA: Diagnosis not present

## 2015-02-01 DIAGNOSIS — C50211 Malignant neoplasm of upper-inner quadrant of right female breast: Secondary | ICD-10-CM

## 2015-02-01 HISTORY — PX: PORT-A-CATH REMOVAL: SHX5289

## 2015-02-01 LAB — POCT HEMOGLOBIN-HEMACUE: HEMOGLOBIN: 9.7 g/dL — AB (ref 12.0–15.0)

## 2015-02-01 SURGERY — BREAST LUMPECTOMY WITH RADIOACTIVE SEED AND SENTINEL LYMPH NODE BIOPSY
Anesthesia: General | Site: Breast | Laterality: Right

## 2015-02-01 MED ORDER — ONDANSETRON HCL 4 MG/2ML IJ SOLN: 4.0000 mg | Freq: Once | INTRAMUSCULAR | Status: DC | PRN

## 2015-02-01 MED ORDER — CHLORHEXIDINE GLUCONATE 4 % EX LIQD: 1.0000 "application " | Freq: Once | CUTANEOUS | Status: DC

## 2015-02-01 MED ORDER — BUPIVACAINE-EPINEPHRINE (PF) 0.25% -1:200000 IJ SOLN
INTRAMUSCULAR | Status: AC
  Filled 2015-02-01: qty 30

## 2015-02-01 MED ORDER — SODIUM CHLORIDE 0.9 % IJ SOLN
INTRAMUSCULAR | Status: AC
  Filled 2015-02-01: qty 10

## 2015-02-01 MED ORDER — LIDOCAINE HCL (CARDIAC) 20 MG/ML IV SOLN
INTRAVENOUS | Status: DC | PRN
  Administered 2015-02-01: 70 mg via INTRAVENOUS

## 2015-02-01 MED ORDER — MIDAZOLAM HCL 2 MG/2ML IJ SOLN
INTRAMUSCULAR | Status: AC
  Filled 2015-02-01: qty 2

## 2015-02-01 MED ORDER — FENTANYL CITRATE 0.05 MG/ML IJ SOLN: 25.0000 ug | INTRAMUSCULAR | Status: DC | PRN

## 2015-02-01 MED ORDER — CEFAZOLIN SODIUM-DEXTROSE 2-3 GM-% IV SOLR
INTRAVENOUS | Status: AC
  Filled 2015-02-01: qty 50

## 2015-02-01 MED ORDER — FENTANYL CITRATE 0.05 MG/ML IJ SOLN
50.0000 ug | INTRAMUSCULAR | Status: DC | PRN
  Administered 2015-02-01: 50 ug via INTRAVENOUS
  Administered 2015-02-01: 100 ug via INTRAVENOUS

## 2015-02-01 MED ORDER — PROPOFOL 10 MG/ML IV BOLUS
INTRAVENOUS | Status: DC | PRN
  Administered 2015-02-01: 200 mg via INTRAVENOUS

## 2015-02-01 MED ORDER — BUPIVACAINE-EPINEPHRINE (PF) 0.5% -1:200000 IJ SOLN
INTRAMUSCULAR | Status: DC | PRN
  Administered 2015-02-01: 25 mL via PERINEURAL

## 2015-02-01 MED ORDER — METHYLENE BLUE 1 % INJ SOLN
INTRAMUSCULAR | Status: AC
  Filled 2015-02-01: qty 10

## 2015-02-01 MED ORDER — BUPIVACAINE-EPINEPHRINE 0.25% -1:200000 IJ SOLN
INTRAMUSCULAR | Status: DC | PRN
  Administered 2015-02-01: 8 mL

## 2015-02-01 MED ORDER — SCOPOLAMINE 1 MG/3DAYS TD PT72
1.0000 | MEDICATED_PATCH | TRANSDERMAL | Status: DC
  Administered 2015-02-01: 1 via TRANSDERMAL
  Administered 2015-02-01: 1.5 mg via TRANSDERMAL

## 2015-02-01 MED ORDER — OXYCODONE-ACETAMINOPHEN 5-325 MG PO TABS: 1.0000 | ORAL_TABLET | ORAL | Status: DC | PRN

## 2015-02-01 MED ORDER — FENTANYL CITRATE 0.05 MG/ML IJ SOLN
INTRAMUSCULAR | Status: DC | PRN
  Administered 2015-02-01 (×2): 25 ug via INTRAVENOUS

## 2015-02-01 MED ORDER — MIDAZOLAM HCL 2 MG/2ML IJ SOLN
1.0000 mg | INTRAMUSCULAR | Status: DC | PRN
  Administered 2015-02-01: 2 mg via INTRAVENOUS

## 2015-02-01 MED ORDER — PROPOFOL 10 MG/ML IV BOLUS
INTRAVENOUS | Status: AC
  Filled 2015-02-01: qty 20

## 2015-02-01 MED ORDER — TECHNETIUM TC 99M SULFUR COLLOID FILTERED
1.0000 | Freq: Once | INTRAVENOUS | Status: AC | PRN
  Administered 2015-02-01: 1 via INTRADERMAL

## 2015-02-01 MED ORDER — LACTATED RINGERS IV SOLN
INTRAVENOUS | Status: DC
  Administered 2015-02-01 (×2): via INTRAVENOUS

## 2015-02-01 MED ORDER — ONDANSETRON HCL 4 MG/2ML IJ SOLN
INTRAMUSCULAR | Status: DC | PRN
  Administered 2015-02-01: 4 mg via INTRAVENOUS

## 2015-02-01 MED ORDER — SCOPOLAMINE 1 MG/3DAYS TD PT72: 1.0000 | MEDICATED_PATCH | TRANSDERMAL | Status: DC

## 2015-02-01 MED ORDER — SCOPOLAMINE 1 MG/3DAYS TD PT72
MEDICATED_PATCH | TRANSDERMAL | Status: AC
  Filled 2015-02-01: qty 1

## 2015-02-01 MED ORDER — DEXAMETHASONE SODIUM PHOSPHATE 4 MG/ML IJ SOLN
INTRAMUSCULAR | Status: DC | PRN
  Administered 2015-02-01: 10 mg via INTRAVENOUS

## 2015-02-01 MED ORDER — FENTANYL CITRATE 0.05 MG/ML IJ SOLN
INTRAMUSCULAR | Status: AC
  Filled 2015-02-01: qty 4

## 2015-02-01 MED ORDER — FENTANYL CITRATE 0.05 MG/ML IJ SOLN
INTRAMUSCULAR | Status: AC
  Filled 2015-02-01: qty 2

## 2015-02-01 MED ORDER — EPHEDRINE SULFATE 50 MG/ML IJ SOLN
INTRAMUSCULAR | Status: DC | PRN
  Administered 2015-02-01: 10 mg via INTRAVENOUS

## 2015-02-01 MED ORDER — CEFAZOLIN SODIUM-DEXTROSE 2-3 GM-% IV SOLR
2.0000 g | INTRAVENOUS | Status: AC
  Administered 2015-02-01: 2 g via INTRAVENOUS

## 2015-02-01 SURGICAL SUPPLY — 53 items
APPLIER CLIP 9.375 MED OPEN (MISCELLANEOUS) ×3
APR CLP MED 9.3 20 MLT OPN (MISCELLANEOUS) ×2
BINDER BREAST LRG (GAUZE/BANDAGES/DRESSINGS) IMPLANT
BINDER BREAST MEDIUM (GAUZE/BANDAGES/DRESSINGS) IMPLANT
BINDER BREAST XLRG (GAUZE/BANDAGES/DRESSINGS) IMPLANT
BINDER BREAST XXLRG (GAUZE/BANDAGES/DRESSINGS) IMPLANT
BLADE SURG 15 STRL LF DISP TIS (BLADE) ×2 IMPLANT
BLADE SURG 15 STRL SS (BLADE) ×3
CANISTER SUC SOCK COL 7IN (MISCELLANEOUS) IMPLANT
CANISTER SUCT 1200ML W/VALVE (MISCELLANEOUS) IMPLANT
CHLORAPREP W/TINT 26ML (MISCELLANEOUS) ×3 IMPLANT
CLIP APPLIE 9.375 MED OPEN (MISCELLANEOUS) ×2 IMPLANT
COVER BACK TABLE 60X90IN (DRAPES) ×3 IMPLANT
COVER MAYO STAND STRL (DRAPES) ×3 IMPLANT
COVER PROBE W GEL 5X96 (DRAPES) ×3 IMPLANT
DECANTER SPIKE VIAL GLASS SM (MISCELLANEOUS) IMPLANT
DEVICE DUBIN W/COMP PLATE 8390 (MISCELLANEOUS) ×3 IMPLANT
DRAPE LAPAROSCOPIC ABDOMINAL (DRAPES) ×3 IMPLANT
DRAPE LAPAROTOMY T 102X78X121 (DRAPES) ×2 IMPLANT
DRAPE UTILITY XL STRL (DRAPES) ×4 IMPLANT
ELECT COATED BLADE 2.86 ST (ELECTRODE) ×3 IMPLANT
ELECT REM PT RETURN 9FT ADLT (ELECTROSURGICAL) ×3
ELECTRODE REM PT RTRN 9FT ADLT (ELECTROSURGICAL) ×2 IMPLANT
GLOVE BIOGEL PI IND STRL 8 (GLOVE) ×2 IMPLANT
GLOVE BIOGEL PI INDICATOR 8 (GLOVE) ×1
GLOVE ECLIPSE 7.5 STRL STRAW (GLOVE) ×3 IMPLANT
GOWN STRL REUS W/ TWL LRG LVL3 (GOWN DISPOSABLE) ×2 IMPLANT
GOWN STRL REUS W/ TWL XL LVL3 (GOWN DISPOSABLE) ×2 IMPLANT
GOWN STRL REUS W/TWL LRG LVL3 (GOWN DISPOSABLE) ×3
GOWN STRL REUS W/TWL XL LVL3 (GOWN DISPOSABLE) ×3
KIT MARKER MARGIN INK (KITS) ×3 IMPLANT
LIQUID BAND (GAUZE/BANDAGES/DRESSINGS) ×3 IMPLANT
NDL HYPO 25X1 1.5 SAFETY (NEEDLE) ×2 IMPLANT
NDL SAFETY ECLIPSE 18X1.5 (NEEDLE) IMPLANT
NEEDLE HYPO 18GX1.5 SHARP (NEEDLE)
NEEDLE HYPO 25X1 1.5 SAFETY (NEEDLE) ×3 IMPLANT
NS IRRIG 1000ML POUR BTL (IV SOLUTION) IMPLANT
PACK BASIN DAY SURGERY FS (CUSTOM PROCEDURE TRAY) ×3 IMPLANT
PENCIL BUTTON HOLSTER BLD 10FT (ELECTRODE) ×3 IMPLANT
SLEEVE SCD COMPRESS KNEE MED (MISCELLANEOUS) ×3 IMPLANT
SPONGE LAP 4X18 X RAY DECT (DISPOSABLE) ×4 IMPLANT
SUT MNCRL AB 4-0 PS2 18 (SUTURE) ×3 IMPLANT
SUT MON AB 4-0 PC3 18 (SUTURE) ×3 IMPLANT
SUT SILK 2 0 SH (SUTURE) IMPLANT
SUT SILK 4 0 TIES 17X18 (SUTURE) IMPLANT
SUT VIC AB 3-0 SH 27 (SUTURE) ×6
SUT VIC AB 3-0 SH 27X BRD (SUTURE) ×2 IMPLANT
SUT VICRYL 3-0 CR8 SH (SUTURE) IMPLANT
SYR CONTROL 10ML LL (SYRINGE) ×3 IMPLANT
TOWEL OR 17X24 6PK STRL BLUE (TOWEL DISPOSABLE) ×6 IMPLANT
TOWEL OR NON WOVEN STRL DISP B (DISPOSABLE) ×3 IMPLANT
TUBE CONNECTING 20X1/4 (TUBING) IMPLANT
YANKAUER SUCT BULB TIP NO VENT (SUCTIONS) IMPLANT

## 2015-02-01 NOTE — Progress Notes (Signed)
Assisted nuc med tech [phone] with nuc med inj Side rails up, monitors on throughout procedure. See vital signs in flow sheet. Tolerated Procedure well.

## 2015-02-01 NOTE — Op Note (Signed)
Preoperative Diagnosis: cancer right breast  Postoprative Diagnosis: cancer right breast  Procedure: Procedure(s): Blue dye injection right breast,  RIGHT BREAST LUMPECTOMY WITH RADIOACTIVE SEED AND SENTINEL LYMPH NODE BIOPSY REMOVAL PORT-A-CATH   Surgeon: Excell Seltzer T   Assistants: None  Anesthesia:  General LMA anesthesia  Indications: Patient is a 59 year old female with a diagnosis of triple negative invasive cancer of the upper inner right breast approximately 6 months ago the primary tumor measuring 2.5 cm, node negative. She has undergone adjuvant chemotherapy with excellent clinical response and resolution of her palpable mass and no residual tumor seen on follow-up MRI. We have recommended proceeding as previously planned with right breast lumpectomy and right axillary sentinel lymph node along with removal of her Port-A-Cath. We have previously discussed the procedure and its indications and risks detailed elsewhere.    Procedure Detail:  Following placement of a radioactive seed at the tumor site yesterday and following injection of 1 mCi of technetium sulfur colloid intradermally around the right nipple in the holding area the patient is brought to the operating room, placed in the supine position on the operating table and laryngeal mask general anesthesia induced. Under sterile technique after patient timeout I injected 5 mL of dilute methylene blue subcutaneously beneath the right nipple and massaged this for several minutes. The patient was then carefully positioned with the right arm extended and the entire anterior chest and breasts, right axilla and upper arm were widely sterilely prepped and draped. She received preoperative IV antibiotics. PAS were in place. Patient timeout and correct procedures were again verified. The lumpectomy was approached initially. Using the neoprobe localized a hot area in the upper inner right breast. A curvilinear incision was made at this  point and dissection carried down into the subcutaneous tissue. Short skin and subcutaneous tenderness flaps were raised. Using the neoprobe for guidance and a generous lumpectomy was excised with cautery around the area of high counts. Dissection was carried down onto the pectoralis muscle. The patient has subpectoral implants. The pectoralis fascia was taken as the posterior margin as the breast tissue here was relatively thin because of her previous augmentation. The specimen was excised and the seed verified in the center of the specimen with the neoprobe. The specimen was inked for margins and specimen mammography showed the seed and the marking clip centrally located within the specimen. This was sent for permanent pathology. The subcutaneous was infiltrated with Marcaine. Hemostasis was assured. The lumpectomy cavity was marked with clips. Breast and subcutaneous tissue was closed with interrupted 3-0 Vicryl. Attention was turned to the sentinel lymph node. Using the neoprobe a hot area was located in the right axilla and a small transverse incision made. Dissection was carried down through the subcutaneous tissue using cautery and the clavipectoral fascia incised. With blunt dissection using the neoprobe for guidance I immediately dissected down onto a slightly enlarged right blue lymph node. It felt soft. It had high counts. It was completely excised with cautery. Ex vivo the node had counts in excess of 1500. I could not feel any adenopathy in the axilla and saw no further blue dye in the neoprobe counts were actually 0. This was sent as hot blue right axillary sentinel lymph node. The skin was infiltrated with local anesthesia and the precipitate his tissue closed with interrupted 3-0 Vicryl. The skin of the breast and axillary incision were closed with subsequent 5-0 Monocryl and Dermabond. Following this the Port-A-Cath was removed. The previous incision was used and  dissection carried down sharply onto  the port. The hub was exposed and the catheter removed intact. Suture material was removed and the port and catheter excised intact. The subcutaneous was closed with interrupted 3-0 Vicryl and the skin was a particular 5-0 Monocryl and Dermabond. Sponge needle and instrument counts were correct.    Findings: As above  Estimated Blood Loss:  Minimal         Drains: none  Blood Given: none          Specimens: #1 right breast lumpectomy     #2 hot blue right axillary sentinel lymph node        Complications:  * No complications entered in OR log *         Disposition: PACU - hemodynamically stable.         Condition: stable

## 2015-02-01 NOTE — Interval H&P Note (Signed)
History and Physical Interval Note:  02/01/2015 11:34 AM  Melissa Garza  has presented today for surgery, with the diagnosis of cancer right breast  The various methods of treatment have been discussed with the patient and family. After consideration of risks, benefits and other options for treatment, the patient has consented to  Procedure(s): RIGHT BREAST LUMPECTOMY WITH RADIOACTIVE SEED AND SENTINEL LYMPH NODE BIOPSY (Right) REMOVAL PORT-A-CATH (N/A) as a surgical intervention .  The patient's history has been reviewed, patient examined, no change in status, stable for surgery.  I have reviewed the patient's chart and labs.  Questions were answered to the patient's satisfaction.     [name]

## 2015-02-01 NOTE — Discharge Instructions (Signed)
[place] Office Phone Number [phone]  BREAST BIOPSY/ PARTIAL MASTECTOMY: POST OP INSTRUCTIONS  Always review your discharge instruction sheet given to you by the facility where your surgery was performed.  IF YOU HAVE DISABILITY OR FAMILY LEAVE FORMS, YOU MUST BRING THEM TO THE OFFICE FOR PROCESSING.  DO NOT GIVE THEM TO YOUR DOCTOR.  1. A prescription for pain medication may be given to you upon discharge.  Take your pain medication as prescribed, if needed.  If narcotic pain medicine is not needed, then you may take acetaminophen (Tylenol) or ibuprofen (Advil) as needed. 2. Take your usually prescribed medications unless otherwise directed 3. If you need a refill on your pain medication, please contact your pharmacy.  They will contact our office to request authorization.  Prescriptions will not be filled after 5pm or on week-ends. 4. You should eat very light the first 24 hours after surgery, such as soup, crackers, pudding, etc.  Resume your normal diet the day after surgery. 5. Most patients will experience some swelling and bruising in the breast.  Ice packs and a good support bra will help.  Swelling and bruising can take several days to resolve.  6. It is common to experience some constipation if taking pain medication after surgery.  Increasing fluid intake and taking a stool softener will usually help or prevent this problem from occurring.  A mild laxative (Milk of Magnesia or Miralax) should be taken according to package directions if there are no bowel movements after 48 hours. 7. Unless discharge instructions indicate otherwise, you may remove your bandages 24-48 hours after surgery, and you may shower at that time.  You may have steri-strips (small skin tapes) in place directly over the incision.  These strips should be left on the skin for 7-10 days.  If your surgeon used skin glue on the incision, you may shower in 24 hours.  The glue will flake off over the  next 2-3 weeks.  Any sutures or staples will be removed at the office during your follow-up visit. 8. ACTIVITIES:  You may resume regular daily activities (gradually increasing) beginning the next day.  Wearing a good support bra or sports bra minimizes pain and swelling.  You may have sexual intercourse when it is comfortable. a. You may drive when you no longer are taking prescription pain medication, you can comfortably wear a seatbelt, and you can safely maneuver your car and apply brakes. b. RETURN TO WORK:  ______________________________________________________________________________________ 9. You should see your doctor in the office for a follow-up appointment approximately two weeks after your surgery.  Your doctors nurse will typically make your follow-up appointment when she calls you with your pathology report.  Expect your pathology report 2-3 business days after your surgery.  You may call to check if you do not hear from [place] after three days. 10. OTHER INSTRUCTIONS: _______________________________________________________________________________________________ _____________________________________________________________________________________________________________________________________ _____________________________________________________________________________________________________________________________________ _____________________________________________________________________________________________________________________________________  WHEN TO CALL YOUR DOCTOR: 1. Fever over 101.0 2. Nausea and/or vomiting. 3. Extreme swelling or bruising. 4. Continued bleeding from incision. 5. Increased pain, redness, or drainage from the incision.  The clinic staff is available to answer your questions during regular business hours.  Please dont hesitate to call and ask to speak to one of the nurses for clinical concerns.  If you have a medical emergency, go to the nearest  emergency room or call 911.  A surgeon from [facility] is always on call at the hospital.  For further questions, please visit [email or web address] Central  [place] Office Phone Number [phone]  BREAST BIOPSY/ PARTIAL MASTECTOMY: POST OP INSTRUCTIONS  Always review your discharge instruction sheet given to you by the facility where your surgery was performed.  IF YOU HAVE DISABILITY OR FAMILY LEAVE FORMS, YOU MUST BRING THEM TO THE OFFICE FOR PROCESSING.  DO NOT GIVE THEM TO YOUR DOCTOR.  11. A prescription for pain medication may be given to you upon discharge.  Take your pain medication as prescribed, if needed.  If narcotic pain medicine is not needed, then you may take acetaminophen (Tylenol) or ibuprofen (Advil) as needed. 12. Take your usually prescribed medications unless otherwise directed 13. If you need a refill on your pain medication, please contact your pharmacy.  They will contact our office to request authorization.  Prescriptions will not be filled after 5pm or on week-ends. 14. You should eat very light the first 24 hours after surgery, such as soup, crackers, pudding, etc.  Resume your normal diet the day after surgery. 15. Most patients will experience some swelling and bruising in the breast.  Ice packs and a good support bra will help.  Swelling and bruising can take several days to resolve.  16. It is common to experience some constipation if taking pain medication after surgery.  Increasing fluid intake and taking a stool softener will usually help or prevent this problem from occurring.  A mild laxative (Milk of Magnesia or Miralax) should be taken according to package directions if there are no bowel movements after 48 hours. 17. Unless discharge instructions indicate otherwise, you may remove your bandages 24-48 hours after surgery, and you may shower at that time.  You may have steri-strips (small skin tapes) in place directly over the  incision.  These strips should be left on the skin for 7-10 days.  If your surgeon used skin glue on the incision, you may shower in 24 hours.  The glue will flake off over the next 2-3 weeks.  Any sutures or staples will be removed at the office during your follow-up visit. 18. ACTIVITIES:  You may resume regular daily activities (gradually increasing) beginning the next day.  Wearing a good support bra or sports bra minimizes pain and swelling.  You may have sexual intercourse when it is comfortable. a. You may drive when you no longer are taking prescription pain medication, you can comfortably wear a seatbelt, and you can safely maneuver your car and apply brakes. b. RETURN TO WORK:  ______________________________________________________________________________________ 19. You should see your doctor in the office for a follow-up appointment approximately two weeks after your surgery.  Your doctors nurse will typically make your follow-up appointment when she calls you with your pathology report.  Expect your pathology report 2-3 business days after your surgery.  You may call to check if you do not hear from [place] after three days. 20. OTHER INSTRUCTIONS: _______________________________________________________________________________________________ _____________________________________________________________________________________________________________________________________ _____________________________________________________________________________________________________________________________________ _____________________________________________________________________________________________________________________________________  WHEN TO CALL YOUR DOCTOR: 6. Fever over 101.0 7. Nausea and/or vomiting. 8. Extreme swelling or bruising. 9. Continued bleeding from incision. 10. Increased pain, redness, or drainage from the incision.  The clinic staff is available to answer your questions  during regular business hours.  Please dont hesitate to call and ask to speak to one of the nurses for clinical concerns.  If you have a medical emergency, go to the nearest emergency room or call 911.  A surgeon from [facility] is always on call at the hospital.  For further questions, please visit [email or web address]  Post Anesthesia Home Care Instructions ° °Activity: °Get plenty of rest for the remainder of the day. A responsible adult should stay with you for 24 hours following the procedure.  °For the next 24 hours, DO NOT: °-Drive a car °-Operate machinery °-Drink alcoholic beverages °-Take any medication unless instructed by your physician °-Make any legal decisions or sign important papers. ° °Meals: °Start with liquid foods such as gelatin or soup. Progress to regular foods as tolerated. Avoid greasy, spicy, heavy foods. If nausea and/or vomiting occur, drink only clear liquids until the nausea and/or vomiting subsides. Call your physician if vomiting continues. ° °Special Instructions/Symptoms: °Your throat may feel dry or sore from the anesthesia or the breathing tube placed in your throat during surgery. If this causes discomfort, gargle with warm salt water. The discomfort should disappear within 24 hours. ° °

## 2015-02-01 NOTE — Transfer of Care (Signed)
Immediate Anesthesia Transfer of Care Note  Patient: Melissa Garza  Procedure(s) Performed: Procedure(s): RIGHT BREAST LUMPECTOMY WITH RADIOACTIVE SEED AND SENTINEL LYMPH NODE BIOPSY (Right) REMOVAL PORT-A-CATH (N/A)  Patient Location: PACU  Anesthesia Type:GA combined with regional for post-op pain  Level of Consciousness: sedated  Airway & Oxygen Therapy: Patient Spontanous Breathing and Patient connected to face mask oxygen  Post-op Assessment: Report given to RN and Post -op Vital signs reviewed and stable  Post vital signs: Reviewed and stable  Last Vitals:  Filed Vitals:   02/01/15 1115  BP: 121/76  Pulse: 95  Temp:   Resp: 13    Complications: No apparent anesthesia complications

## 2015-02-01 NOTE — Anesthesia Preprocedure Evaluation (Signed)
Anesthesia Evaluation  Patient identified by MRN, date of birth, ID band Patient awake    Reviewed: Allergy & Precautions, NPO status , Patient's Chart, lab work & pertinent test results  History of Anesthesia Complications (+) PONV  Airway Mallampati: I  TM Distance: >3 FB Neck ROM: Full    Dental  (+) Teeth Intact, Dental Advisory Given   Pulmonary  breath sounds clear to auscultation        Cardiovascular Rhythm:Regular Rate:Normal     Neuro/Psych    GI/Hepatic GERD-  Medicated and Controlled,  Endo/Other    Renal/GU      Musculoskeletal   Abdominal   Peds  Hematology   Anesthesia Other Findings   Reproductive/Obstetrics                             Anesthesia Physical Anesthesia Plan  ASA: II  Anesthesia Plan: General   Post-op Pain Management:    Induction: Intravenous  Airway Management Planned: LMA  Additional Equipment:   Intra-op Plan:   Post-operative Plan: Extubation in OR  Informed Consent: I have reviewed the patients History and Physical, chart, labs and discussed the procedure including the risks, benefits and alternatives for the proposed anesthesia with the patient or authorized representative who has indicated his/her understanding and acceptance.   Dental advisory given  Plan Discussed with: CRNA, Anesthesiologist and Surgeon  Anesthesia Plan Comments:         Anesthesia Quick Evaluation

## 2015-02-01 NOTE — H&P (Signed)
History of Present Illness Melissa Garza T. Sedalia Greeson MD; 01/14/2015 9:36 AM) Patient words: breast f/u.  The patient is a 59 year old female who presents with breast cancer. 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 Her original tumor size was approximately 2.5 cm. She did well with chemotherapy with just expected side effects. The palpable mass resolved. She is feeling pretty well at this point. I personally reviewed her MRI which shows complete resolution of the abnormal enhancement in the breast and no other abnormalities. She is here to plan surgical treatment.   Other Problems Marjean Donna, CMA; 01/14/2015 9:13 AM) Anxiety Disorder Breast Cancer Depression Gastroesophageal Reflux Disease Hepatitis Lump In Breast Migraine Headache  Past Surgical History Marjean Donna, CMA; 01/14/2015 9:13 AM) Breast Augmentation Bilateral. Breast Biopsy Right. Oral Surgery Tonsillectomy  Diagnostic Studies History Marjean Donna, CMA; 01/14/2015 9:13 AM) Colonoscopy 1-5 years ago Mammogram within last year Pap Smear 1-5 years ago  Allergies Marjean Donna, CMA; 01/14/2015 9:15 AM) Hydrocodone-Acetaminophen *ANALGESICS - OPIOID*  Medication History (Sonya Bynum, CMA; 01/14/2015 9:16 AM) ALPRAZolam (1MG  Tablet, Oral) Active. LORazepam (0.5MG  Tablet, Oral) Active. Concerta (54MG  Tablet ER, Oral) Active. Zolpidem Tartrate (10MG  Tablet, Oral) Active. FLUoxetine HCl (40MG  Capsule, Oral) Active. Gabapentin (100MG  Capsule, Oral) Active. Lidocaine-Prilocaine (2.5-2.5% Cream, External) Active. Ondansetron HCl (8MG  Tablet, Oral as needed) Active. Prochlorperazine Maleate (10MG  Tablet, Oral) Active. Medications Reconciled  Social History Marjean Donna, CMA; 01/14/2015 9:13 AM) Alcohol use Occasional alcohol use. Caffeine use Carbonated beverages, Coffee, Tea. Illicit drug use  Uses socially only. Tobacco use Never smoker.  Family History Marjean Donna, CMA; 01/14/2015 9:13 AM) Depression Sister. Heart Disease Father. Hypertension Father, Son. Ischemic Bowel Disease Mother. Migraine Headache Mother, Son.  Pregnancy / Birth History Marjean Donna, [place]; 01/14/2015 9:13 AM) Age at menarche 59 years. Age of menopause 39-60 Gravida 38 Maternal age 15-25 Para 2  Review of Systems ([place]; 01/14/2015 9:13 AM) General Present- Fatigue, Night Sweats and Weight Gain. Not Present- Appetite Loss, Chills, Fever and Weight Loss. Skin Present- Rash. Not Present- Change in Wart/Mole, Dryness, Hives, Jaundice, New Lesions, Non-Healing Wounds and Ulcer. HEENT Present- Sinus Pain. Not Present- Earache, Hearing Loss, Hoarseness, Nose Bleed, Oral Ulcers, Ringing in the Ears, Seasonal Allergies, Sore Throat, Visual Disturbances, Wears glasses/contact lenses and Yellow Eyes. Respiratory Present- Difficulty Breathing and Snoring. Not Present- Bloody sputum, Chronic Cough and Wheezing. Breast Present- Breast Mass. Not Present- Breast Pain, Nipple Discharge and Skin Changes. Cardiovascular Present- Shortness of Breath. Not Present- Chest Pain, Difficulty Breathing Lying Down, Leg Cramps, Palpitations, Rapid Heart Rate and Swelling of Extremities. Gastrointestinal Present- Bloating, Constipation and Indigestion. Not Present- Abdominal Pain, Bloody Stool, Change in Bowel Habits, Chronic diarrhea, Difficulty Swallowing, Excessive gas, Gets full quickly at meals, Hemorrhoids, Nausea, Rectal Pain and Vomiting. Female Genitourinary Not Present- Frequency, Nocturia, Painful Urination, Pelvic Pain and Urgency. Musculoskeletal Present- Back Pain. Not Present- Joint Pain, Joint Stiffness, Muscle Pain, Muscle Weakness and Swelling of Extremities. Neurological Present- Headaches and Weakness. Not Present- Decreased Memory, Fainting, Numbness, Seizures, Tingling, Tremor and Trouble  walking. Psychiatric Present- Anxiety and Depression. Not Present- Bipolar, Change in Sleep Pattern, Fearful and Frequent crying. Endocrine Present- Hair Changes and Hot flashes. Not Present- Cold Intolerance, Excessive Hunger, Heat Intolerance and New Diabetes.   Vitals (Sonya Bynum CMA; 01/14/2015 9:14 AM) 01/14/2015 9:14 AM Weight: 159 lb Height: 61in Body Surface Area: 1.76 m Body Mass Index: 30.04 kg/m Temp.: 97.43F(Temporal)  Pulse: 136 (Regular)  BP: 128/86 (Sitting, Left Arm, Standard)    Physical Exam Melissa Garza T. Berenice Oehlert MD; 01/14/2015 9:38 AM) General Note: General: Well-developed Caucasian female in no distress Skin: No rash or infection HEENT: No palpable masses. Sclerae nonicteric. Lymph nodes: No cervical, supra clavicular or axillary nodes palpable Breasts: Status post bilateral mammoplasty. There are no palpable masses in either breast. No skin changes or nipple inversion. Lungs: Clear breath sounds bilaterally Cardiac: Regular mild tachycardia.     Assessment & Plan Melissa Garza T. Blakeley Scheier MD; 01/14/2015 9:41 AM) BREAST CANCER, RIGHT (174.9  C50.911) Impression: Status post neoadjuvant chemotherapy for stage II a triple negative invasive ductal carcinoma of the right breast with complete clinical response by physical exam and MRI. This is obviously very good news. We discussed proceeding with surgical treatment. We will plan seed localized right breast lumpectomy with right axillary sentinel lymph node biopsy and removal of her Port-A-Cath. We again discussed the procedure its nature and recovery and risks of bleeding, infection, Current Plans  Schedule for Surgery Radioactive seed localized right breast lumpectomy with right axillary sentinel lymph node biopsy and removal of Port-A-Cath under general anesthesia as an outpatient

## 2015-02-01 NOTE — Progress Notes (Signed)
Assisted Dr. Crews with right, ultrasound guided, pectoralis block. Side rails up, monitors on throughout procedure. See vital signs in flow sheet. Tolerated Procedure well. 

## 2015-02-01 NOTE — Anesthesia Procedure Notes (Addendum)
Procedure Name: LMA Insertion Date/Time: 02/01/2015 11:46 AM Performed by: Maryella Shivers Pre-anesthesia Checklist: Patient identified, Emergency Drugs available, Suction available and Patient being monitored Patient Re-evaluated:Patient Re-evaluated prior to inductionOxygen Delivery Method: Circle System Utilized Preoxygenation: Pre-oxygenation with 100% oxygen Intubation Type: IV induction Ventilation: Mask ventilation without difficulty LMA: LMA inserted LMA Size: 4.0 Number of attempts: 1 Airway Equipment and Method: Bite block Placement Confirmation: positive ETCO2 Tube secured with: Tape Dental Injury: Teeth and Oropharynx as per pre-operative assessment    Anesthesia Regional Block:  Pectoralis block  Pre-Anesthetic Checklist: ,, timeout performed, Correct Patient, Correct Site, Correct Laterality, Correct Procedure, Correct Position, site marked, Risks and benefits discussed,  Surgical consent,  Pre-op evaluation,  At surgeon's request and post-op pain management  Laterality: Right and Upper  Prep: chloraprep       Needles:  Injection technique: Single-shot  Needle Type: Echogenic Needle     Needle Length: 9cm 9 cm Needle Gauge: 21 and 21 G    Additional Needles:  Procedures: ultrasound guided (picture in chart) Pectoralis block Narrative:  Start time: 02/01/2015 11:07 AM End time: 02/01/2015 11:12 AM Injection made incrementally with aspirations every 5 mL.  Performed by: Personally  Anesthesiologist: Ruben Pyka

## 2015-02-01 NOTE — Anesthesia Postprocedure Evaluation (Signed)
  Anesthesia Post-op Note  Patient: Melissa Garza  Procedure(s) Performed: Procedure(s): RIGHT BREAST LUMPECTOMY WITH RADIOACTIVE SEED AND SENTINEL LYMPH NODE BIOPSY (Right) REMOVAL PORT-A-CATH (N/A)  Patient Location: PACU  Anesthesia Type: General with regional for post op pain   Level of Consciousness: awake, alert  and oriented  Airway and Oxygen Therapy: Patient Spontanous Breathing  Post-op Pain: none  Post-op Assessment: Post-op Vital signs reviewed  Post-op Vital Signs: Reviewed  Last Vitals:  Filed Vitals:   02/01/15 1400  BP: 131/79  Pulse: 92  Temp: 36.6 C  Resp: 18    Complications: No apparent anesthesia complications

## 2015-02-02 ENCOUNTER — Encounter (HOSPITAL_BASED_OUTPATIENT_CLINIC_OR_DEPARTMENT_OTHER): Payer: Self-pay | Admitting: General Surgery

## 2015-02-11 NOTE — Progress Notes (Signed)
Location of Breast Cancer: right upper inner  Histology per Pathology Report:  02/01/15 Diagnosis 1. Breast, lumpectomy, right - FIBROCYSTIC CHANGES. - TREATMENT RELATED CHANGES. - HEALING BIOPSY SITE. - THERE IS NO EVIDENCE OF MALIGNANCY. - SEE COMMENT. 2. Lymph node, sentinel, biopsy, right axillary - THERE IS NO EVIDENCE OF CARCINOMA IN 1 OF 1 LYMPH NODE (0/1).  02/01/14 Diagnosis Breast, right, needle core biopsy, mass, 12:30 - INVASIVE DUCTAL CARCINOMA. - DUCTAL CARCINOMA IN SITU.  Receptor Status: ER(0%), PR (0%), Her2-neu (0), Ki-67(98%)  Ms. Melissa Garza palpated a mass in her right breast approximately mid-September. She brought this to her gynecologist attention and he set her up for bilateral diagnostic mammography at the breast center 08/04/2014. The breast density was category C. in the patient has bilateral saline implants in place. In the palpable area of concern in the right breast there was an irregular mass measuring up to 2.5 cm. By palpation this was firm at the 12:30 o'clock position. Ultrasound of the right breast confirmed an irregular hypoechoic mass in this area measuring 2.5 cm. There was no right axillary lymphadenopathy noted.  Past/Anticipated interventions by surgeon, if any: Dr Excell Seltzer: Biospy of the right breast followed by Lumpectomy with Sentinel Node Biopsy  Past/Anticipated interventions by medical oncology, if any:  Dr. Gunnar Bulla Magrinat - 12 planned cycles of weekly paclitaxel with carboplatin added weekly for cycles 6-12. Completed chemo 01/04/15. MRI on 01/07/15- shows a complete radiologic response.  Lymphedema issues, if any: no  Pain issues, if any:  Over past 1 1/2 weeks she has developed lower back pain, takes Aleve, uses creams, heat with fair relief. Occasional "pressure type" pain in her upper left arm since beginning chemo  SAFETY ISSUES:  Prior radiation? No  Pacemaker/ICD? NO  Possible current pregnancy? No  Is the patient  on methotrexate? No  Current Complaints / other details:  Menarche age 39, first live birth age 79. She is GX P2. She stopped having periods in 2005 and started hormone replacement at that time. She was asked to stop those at the time of her breast cancer diagnosis October 2015.  There is no history of breast or ovarian cancer in the family to her knowledge.     Deirdre Evener, RN 02/11/2015,6:35 PM

## 2015-02-16 ENCOUNTER — Ambulatory Visit: Payer: 59

## 2015-02-16 ENCOUNTER — Ambulatory Visit: Payer: 59 | Admitting: Radiation Oncology

## 2015-02-16 ENCOUNTER — Ambulatory Visit (HOSPITAL_BASED_OUTPATIENT_CLINIC_OR_DEPARTMENT_OTHER): Payer: 59 | Admitting: Oncology

## 2015-02-16 ENCOUNTER — Other Ambulatory Visit (HOSPITAL_BASED_OUTPATIENT_CLINIC_OR_DEPARTMENT_OTHER): Payer: 59

## 2015-02-16 VITALS — BP 128/75 | HR 108 | Temp 97.7°F | Resp 18 | Ht 61.0 in | Wt 161.6 lb

## 2015-02-16 DIAGNOSIS — Z171 Estrogen receptor negative status [ER-]: Secondary | ICD-10-CM

## 2015-02-16 DIAGNOSIS — C50211 Malignant neoplasm of upper-inner quadrant of right female breast: Secondary | ICD-10-CM

## 2015-02-16 DIAGNOSIS — D630 Anemia in neoplastic disease: Secondary | ICD-10-CM

## 2015-02-16 LAB — CBC WITH DIFFERENTIAL/PLATELET
BASO%: 0.5 % (ref 0.0–2.0)
Basophils Absolute: 0 10*3/uL (ref 0.0–0.1)
EOS ABS: 0.2 10*3/uL (ref 0.0–0.5)
EOS%: 4.3 % (ref 0.0–7.0)
HCT: 37.3 % (ref 34.8–46.6)
HEMOGLOBIN: 12.4 g/dL (ref 11.6–15.9)
LYMPH%: 28.3 % (ref 14.0–49.7)
MCH: 33.1 pg (ref 25.1–34.0)
MCHC: 33.2 g/dL (ref 31.5–36.0)
MCV: 99.5 fL (ref 79.5–101.0)
MONO#: 0.3 10*3/uL (ref 0.1–0.9)
MONO%: 6.8 % (ref 0.0–14.0)
NEUT#: 2.6 10*3/uL (ref 1.5–6.5)
NEUT%: 60.1 % (ref 38.4–76.8)
Platelets: 192 10*3/uL (ref 145–400)
RBC: 3.75 10*6/uL (ref 3.70–5.45)
RDW: 14.4 % (ref 11.2–14.5)
WBC: 4.4 10*3/uL (ref 3.9–10.3)
lymph#: 1.2 10*3/uL (ref 0.9–3.3)

## 2015-02-16 LAB — COMPREHENSIVE METABOLIC PANEL (CC13)
ALK PHOS: 86 U/L (ref 40–150)
ALT: 25 U/L (ref 0–55)
AST: 22 U/L (ref 5–34)
Albumin: 3.3 g/dL — ABNORMAL LOW (ref 3.5–5.0)
Anion Gap: 10 mEq/L (ref 3–11)
BUN: 9.5 mg/dL (ref 7.0–26.0)
CO2: 28 mEq/L (ref 22–29)
CREATININE: 0.8 mg/dL (ref 0.6–1.1)
Calcium: 9.1 mg/dL (ref 8.4–10.4)
Chloride: 105 mEq/L (ref 98–109)
EGFR: 78 mL/min/{1.73_m2} — ABNORMAL LOW (ref >90)
Glucose: 104 mg/dl (ref 70–140)
Potassium: 3.8 mEq/L (ref 3.5–5.1)
Sodium: 143 mEq/L (ref 136–145)
Total Bilirubin: 0.29 mg/dL (ref 0.20–1.20)
Total Protein: 6.1 g/dL — ABNORMAL LOW (ref 6.4–8.3)

## 2015-02-16 NOTE — Progress Notes (Signed)
Melissa Garza  Telephone:(336) [phone] [phone]     ID: Melissa Garza DOB: 01/07/1956  MR#: [id number]  OPF#:[id number]  Patient Care Team: No Pcp Per Patient as PCP - General (General Practice) Harle Battiest, MD as Consulting Physician (Obstetrics and Gynecology) Excell Seltzer, MD as Consulting Physician (General Surgery) Chauncey Cruel, MD as Consulting Physician (Oncology) Arloa Koh, MD as Consulting Physician (Radiation Oncology) OTHER MD: Chucky May MD  CHIEF COMPLAINT: Triple negative breast cancer  CURRENT TREATMENT: Awaiting adjuvant radiation  BREAST CANCER HISTORY: From the original intake note:  Melissa Garza herself palpated a mass in her right breast approximately mid-September. She brought this to her gynecologist attention and he set her up for bilateral diagnostic mammography at the breast center 08/04/2014. The breast density was category C. in the patient has bilateral saline implants in place. In the palpable area of concern in the right breast there was an irregular mass measuring up to 2.5 cm. By palpation this was firm at the 12:30 o'clock position. Ultrasound of the right breast confirmed an irregular hypoechoic mass in this area measuring 2.5 cm. There was no right axillary lymphadenopathy noted.  Biopsy of the mass in question 08/04/2014 showed ([id number]) invasive ductal carcinoma, grade 3, triple negative, with an MIB-1 of 90%.  Bilateral breast MRI 08/09/2014 showed in the upper inner quadrant of the right breast an irregular enhancing mass measuring 2.8 cm. There was a 4 mm satellite nodule anterior to this and separated from that by 0.6 cm. The left breast, the remaining of the right breast and the lymph node areas were negative.  The patient's subsequent history is as detailed below  INTERVAL HISTORY: Melissa Garza returns today for follow up of her breast cancer accompanied by her husband. Since her last visit here she underwent  right lumpectomy and sentinel lymph node sampling. The final pathology (02/01/2015, [id number]) showed only fibrocystic changes in the right breast and no cancer in the single sentinel lymph node. Her port was removed at the same time   ROS: Melissa Garza did well with the surgery and specifically had no nausea, which really had worried her. She had some soreness for the first couple of days. She went to the beach this past week and did a lot of walking, and now has some low back pain which is bothering her. She has some soreness also on the deltoid region of the left upper arm. She does feel fatigued. She has a long history of headaches, which actually got better during chemotherapy (likely due to premeds), and now she is back to having occasional headaches, but no visual changes, nausea, vomiting, cough, all from production. She has gained weight and this concerns her. She is slightly constipated. A detailed review of systems today was otherwise stable.   PAST MEDICAL HISTORY: Past Medical History  Diagnosis Date  . Glaucoma   . Anxiety   . Hot flashes   . Depression   . PONV (postoperative nausea and vomiting)   . Headache     chronic  . Shortness of breath     with exertion  . GERD (gastroesophageal reflux disease)     PMH  . Cancer     cancer of right breast  . Hepatitis     Hx: of Hepatitis not sure which one    PAST SURGICAL HISTORY: Past Surgical History  Procedure Laterality Date  . Tonsilectomy/adenoidectomy with myringotomy    . Breast enhancement surgery  2008  . Eye surgery  Lasik  . Colonoscopy    . Breast surgery      Biopsy  . Dilation and curettage of uterus    . Tubal ligation    . Portacath placement N/A 08/13/2014    Procedure: INSERTION PORT-A-CATH;  Surgeon: Excell Seltzer, MD;  Location: [facility];  Service: General;  Laterality: N/A;  . Port-a-cath removal N/A 02/01/2015    Procedure: REMOVAL PORT-A-CATH;  Surgeon: Excell Seltzer, MD;  Location: [facility];  Service: General;  Laterality: N/A;    FAMILY HISTORY Family History  Problem Relation Age of Onset  . Psoriasis Mother   . Hypertension Father   . Asthma Other   . Thyroid disease Other    The patient's parents are currently alive, in their late 43s. The patient had no brothers, 2 sisters. There is no history of breast or ovarian cancer in the family to her knowledge.   GYNECOLOGIC HISTORY:  No LMP recorded. Patient is postmenopausal. Menarche age 31, first live birth age 24. She is GX P2. She stopped having periods in 2005 and started hormone replacement at that time. 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 Her husband, Melissa Garza, is disabled secondary to multiple cardiac problems. Son Melissa Garza lives in [place] and works in apartment maintenance. Son Melissa Garza is his wife) works as an Clinical biochemist. The patient has 6 grandchildren. She is not a Ambulance person.   ADVANCED DIRECTIVES: Not in place   HEALTH MAINTENANCE: History  Substance Use Topics  . Smoking status: Never Smoker   . Smokeless tobacco: Never Used  . Alcohol Use: No     Colonoscopy:Remote  PAP:2014  Bone density:Never  Lipid panel:  Allergies  Allergen Reactions  . Hydrocodone Hives and Itching    Current Outpatient Prescriptions  Medication Sig Dispense Refill  . acetaminophen (TYLENOL) 500 MG tablet Take 1,000 mg by mouth every 6 (six) hours as needed for mild pain.    Marland Kitchen ALPRAZolam (XANAX) 1 MG tablet Take 1 mg by mouth 4 (four) times daily as needed for anxiety.    Marland Kitchen CLOBETASOL PROPIONATE EX Apply 1 application topically daily as needed (DRY SCALP).    . clotrimazole-betamethasone (LOTRISONE) cream Apply 1 application topically 2 (two) times daily. 30 g 0  . famotidine (PEPCID) 20 MG tablet Take 20 mg by mouth 2 (two) times daily.    Marland Kitchen FLUoxetine  (PROZAC) 40 MG capsule Take 40 mg by mouth daily.    Marland Kitchen gabapentin (NEURONTIN) 100 MG capsule Take 1 capsule (100 mg total) by mouth daily. 60 capsule 2  . gabapentin (NEURONTIN) 300 MG capsule Take 1 capsule (300 mg total) by mouth at bedtime. 90 capsule 2  . loratadine (CLARITIN) 10 MG tablet Take 10 mg by mouth daily.    Marland Kitchen LORazepam (ATIVAN) 0.5 MG tablet Take 1 tablet (0.5 mg total) by mouth every 8 (eight) hours. 30 tablet 0  . methylphenidate 54 MG PO CR tablet Take 54 mg by mouth every morning.    . nystatin-triamcinolone (MYCOLOG II) cream Apply 1 application topically daily as needed.     Marland Kitchen oxyCODONE-acetaminophen (ROXICET) 5-325 MG per tablet Take 1-2 tablets by mouth every 4 (four) hours as needed for severe pain. 30 tablet 0  . Polyvinyl Alcohol-Povidone (REFRESH OP) Place 2 drops into both eyes daily as needed (ALLERGIES).    Marland Kitchen  travoprost, benzalkonium, (TRAVATAN) 0.004 % ophthalmic solution Place 1 drop into both eyes at bedtime.     . traZODone (DESYREL) 50 MG tablet Take 50 mg by mouth at bedtime as needed for sleep.    Marland Kitchen zolpidem (AMBIEN) 10 MG tablet Take 10 mg by mouth at bedtime as needed for sleep.     No current facility-administered medications for this visit.   Facility-Administered Medications Ordered in Other Visits  Medication Dose Route Frequency Provider Last Rate Last Dose  . sodium chloride 0.9 % injection 10 mL  10 mL Intracatheter PRN Chauncey Cruel, MD   10 mL at 11/30/14 1339    OBJECTIVE:  Middle-aged white woman who appears stated age  17 Vitals:   02/16/15 0915  BP: 128/75  Pulse: 108  Temp: 97.7 F (36.5 C)  Resp: 18     Body mass index is 30.55 kg/(m^2).    ECOG FS:1 - Symptomatic but completely ambulatory  Sclerae unicteric, pupilsround and equal  Oropharynx clear and moist No cervical or supraclavicular adenopathy Lungs no rales or rhonchi Heart regular rate and rhythm Abd soft,  obese, nontender, positive bowel sounds MSK no focal  spinal tenderness to vigorous percussion including the lower lumbar spine, no upper extremity lymphedemaIn the area where she is sore in the left deltoid area is entirely unremarkable to inspection and palpation  Neuro: nonfocal, well oriented, anxious affect Breasts:Status post right lumpectomy and sentinel lymph node sampling. The incisions are healing nicely. The cosmetic result is excellent. There is no dehiscence, erythema, or swelling. There is no unusual tenderness. The left breast is unremarkable.   LAB RESULTS:  CMP     Component Value Date/Time   NA 139 01/11/2015 0900   NA 143 06/17/2009 1137   K 3.8 01/11/2015 0900   K 4.7 06/17/2009 1137   CL 114* 06/17/2009 1137   CO2 25 01/11/2015 0900   CO2 24 06/17/2009 1137   GLUCOSE 120 01/11/2015 0900   GLUCOSE 97 06/17/2009 1137   GLUCOSE 81 10/22/2006 1253   BUN 12.6 01/11/2015 0900   BUN 16 06/17/2009 1137   CREATININE 0.8 01/11/2015 0900   CREATININE 1.2 06/17/2009 1137   CALCIUM 8.6 01/11/2015 0900   CALCIUM 9.4 06/17/2009 1137   PROT 5.7* 01/11/2015 0900   PROT 7.0 06/17/2009 1137   ALBUMIN 3.0* 01/11/2015 0900   ALBUMIN 3.9 06/17/2009 1137   AST 21 01/11/2015 0900   AST 15 06/17/2009 1137   ALT 25 01/11/2015 0900   ALT 13 06/17/2009 1137   ALKPHOS 66 01/11/2015 0900   ALKPHOS 55 06/17/2009 1137   BILITOT 0.26 01/11/2015 0900   BILITOT 0.7 06/17/2009 1137   GFRNONAA 49.82 06/17/2009 1137    I No results found for: SPEP  Lab Results  Component Value Date   WBC 4.4 02/16/2015   NEUTROABS 2.6 02/16/2015   HGB 12.4 02/16/2015   HCT 37.3 02/16/2015   MCV 99.5 02/16/2015   PLT 192 02/16/2015      Chemistry      Component Value Date/Time   NA 139 01/11/2015 0900   NA 143 06/17/2009 1137   K 3.8 01/11/2015 0900   K 4.7 06/17/2009 1137   CL 114* 06/17/2009 1137   CO2 25 01/11/2015 0900   CO2 24 06/17/2009 1137   BUN 12.6 01/11/2015 0900   BUN 16 06/17/2009 1137   CREATININE 0.8 01/11/2015 0900    CREATININE 1.2 06/17/2009 1137      Component Value Date/Time  CALCIUM 8.6 01/11/2015 0900   CALCIUM 9.4 06/17/2009 1137   ALKPHOS 66 01/11/2015 0900   ALKPHOS 55 06/17/2009 1137   AST 21 01/11/2015 0900   AST 15 06/17/2009 1137   ALT 25 01/11/2015 0900   ALT 13 06/17/2009 1137   BILITOT 0.26 01/11/2015 0900   BILITOT 0.7 06/17/2009 1137      No results found for: LABCA2  No components found for: LABCA125  No results for input(s): INR in the last 168 hours.  Urinalysis No results found for: COLORURINE  STUDIES: Nm Sentinel Node Inj-no Rpt (breast)  02/01/2015   CLINICAL DATA: cancer right breast   Sulfur colloid was injected intradermally by the nuclear medicine  technologist for breast cancer sentinel node localization.    Mm Breast Surgical Specimen  02/01/2015   EXAM: SPECIMEN RADIOGRAPH OF THE RIGHT BREAST  COMPARISON:  Previous exam(s).  FINDINGS: Status post excision of the right breast. The radioactive seed and biopsy marker clip are present, completely intact, and were marked for pathology.  IMPRESSION: Specimen radiograph of the right breast.   Electronically Signed   By: Curlene Dolphin M.D.   On: 02/01/2015 15:30   Mm Rt Radioactive Seed Loc Mammo Guide  01/31/2015   CLINICAL DATA:  Status post neoadjuvant chemotherapy four upper inner quadrant right breast invasive ductal carcinoma and ductal carcinoma in situ. Pre lumpectomy seed localization.  EXAM: MAMMOGRAPHIC GUIDED RADIOACTIVE SEED LOCALIZATION OF THE RIGHT BREAST  COMPARISON:  Previous exam(s).  FINDINGS: Patient presents for radioactive seed localization prior to right lumpectomy. I met with the patient and we discussed the procedure of seed localization including benefits and alternatives. We discussed the high likelihood of a successful procedure. We discussed the risks of the procedure including infection, bleeding, tissue injury, implant rupture and further surgery. We discussed the low dose of radioactivity  involved in the procedure. Informed, written consent was given.  The usual time-out protocol was performed immediately prior to the procedure.  Using mammographic guidance, sterile technique, 2% lidocaine and an I-125 radioactive seed, the heart shaped biopsy marker clip located in the posterior aspect of the upper inner right breast was localized using a superior approach. The follow-up mammogram images confirm the seed in the expected location and were marked for Dr. Excell Seltzer.  Follow-up survey of the patient confirms presence of the radioactive seed.  Order number of I-125 seed:  [id number].  Total activity:  0.253 mCi  Reference Date: 12/30/2014  The patient tolerated the procedure well and was released from the [facility]. She was given instructions regarding seed removal.  IMPRESSION: Radioactive seed localization right breast. No apparent complications.   Electronically Signed   By: Claudie Revering M.D.   On: 01/31/2015 14:41    ASSESSMENT: 58 y.o. [place] woman status post right breast upper inner quadrant biopsy 08/04/2014 for a clinical T2 N0, stage IIA invasive ductal carcinoma, grade 3, triple negative, with an MIB-1 of 90%.  1. neoadjuvant chemotherapy with doxorubicin and cyclophosphamide dose dense x4 completed on 10/05/14; followed by paclitaxel weekly x12 started 10/19/2014. Carboplatin added for cycles 6-12, completed 01/04/15  2. Right lumpectomy and sentinel lymph node sampling 02/01/2015 showed a complete pathologic response.  3. Radiation to follow surgery  PLAN: Hayly did very well with her surgery and the pathology showed a complete pathologic response. I emphasized that this means she has a very good long-term prognosis. She wanted to know if she was "cancer free", which is a phrase that I tend to avoid.  If it means that she is in remission, that is certainly the case and our hope is that she will remain in remission indefinitely. As stated, her risk of recurrence is low  She is  now ready to start radiation. If she continues to exercise during radiation she will get out of the fatigue associated with those treatments or quickly. I also suggested she go ahead and sign up for the June or July "finding your new normal", which will be very helpful to her as she stops coming so frequently to the cancer center and begins to enter routine follow-up on survivorship.  Melissa Garza has a good understanding of the overall plan. She agrees with it. She knows a goal of treatment in her case is cure. She will call with any problems that may develop before her next visit here.    Chauncey Cruel, [place] [phone] 02/16/2015 9:45 AM

## 2015-02-17 ENCOUNTER — Ambulatory Visit
Admission: RE | Admit: 2015-02-17 | Discharge: 2015-02-17 | Disposition: A | Discharge status: Home / Self Care | Payer: 59 | Source: Ambulatory Visit | Attending: Radiation Oncology | Admitting: Radiation Oncology

## 2015-02-17 ENCOUNTER — Telehealth: Payer: Self-pay | Admitting: *Deleted

## 2015-02-17 ENCOUNTER — Encounter: Payer: Self-pay | Admitting: Radiation Oncology

## 2015-02-17 VITALS — BP 111/77 | HR 92 | Temp 97.7°F | Resp 18 | Wt 163.6 lb

## 2015-02-17 DIAGNOSIS — Z9011 Acquired absence of right breast and nipple: Secondary | ICD-10-CM | POA: Insufficient documentation

## 2015-02-17 DIAGNOSIS — Z9221 Personal history of antineoplastic chemotherapy: Secondary | ICD-10-CM | POA: Diagnosis not present

## 2015-02-17 DIAGNOSIS — Z510 Encounter for antineoplastic radiation therapy: Secondary | ICD-10-CM | POA: Insufficient documentation

## 2015-02-17 DIAGNOSIS — C50211 Malignant neoplasm of upper-inner quadrant of right female breast: Secondary | ICD-10-CM

## 2015-02-17 DIAGNOSIS — B369 Superficial mycosis, unspecified: Secondary | ICD-10-CM | POA: Insufficient documentation

## 2015-02-17 DIAGNOSIS — D0591 Unspecified type of carcinoma in situ of right breast: Secondary | ICD-10-CM | POA: Insufficient documentation

## 2015-02-17 DIAGNOSIS — L598 Other specified disorders of the skin and subcutaneous tissue related to radiation: Secondary | ICD-10-CM | POA: Diagnosis not present

## 2015-02-17 DIAGNOSIS — Z171 Estrogen receptor negative status [ER-]: Secondary | ICD-10-CM | POA: Insufficient documentation

## 2015-02-17 NOTE — Progress Notes (Signed)
CC: Dr. Adonis Housekeeper, Dr. Gunnar Bulla Magrinat  Follow-up note:  Diagnosis: Clinical stage IIA (T2 N0 M0), pathologic stage (ypT0, YpN0) invasive ductal/DCIS of the right breast.  History:  I first saw the patient in consultation on 08/11/2014. She noted a mass along the upper inner quadrant of her right breast at approximately 12:30. Mammography and ultrasonography on 08/04/2014 showed a highly suspicious right breast mass at 12:30 measuring 2.5 cm. Ultrasound-guided biopsy on 08/04/2014 was diagnostic for invasive ductal carcinoma/DCIS. The tumor was triple negative. Breast MR on 08/09/2014 should a 2.2 x 2.2 x 2.8 cm mass along the upper inner quadrant of the right breast with a single 4 mm satellite nodule 6 mm anterior to the lateral margin.  She received neoadjuvant chemotherapy under the direction of Dr. Dr. Jana Hakim.  On 01/07/2015, her MRI scan showed a radiographic complete response.  On 02/01/2015 she underwent a right partial mastectomy and sentinel lymph node biopsy.  There was no residual malignancy within the right partial mastectomy specimen and a single lymph node was free of metastatic disease.  She is obviously elated.  She is without complaints today.  Physical examination: Alert and oriented. Filed Vitals:   02/17/15 0741  BP: 111/77  Pulse: 92  Temp: 97.7 F (36.5 C)  Resp: 18   Head and neck examination: Remarkable for alopecia.  Nodes: Without palpable cervical, supraclavicular, or axillary lymphadenopathy.  Breasts: She has bilateral implants.  There is a partial mastectomy wound along the upper inner quadrant of the right breast extending from 12 to 1:00.  The wound is healing well.  No masses are palpable.  Left breast without masses or lesions.  Extremities: Without edema.  Laboratory data: Lab Results  Component Value Date   WBC 4.4 02/16/2015   HGB 12.4 02/16/2015   HCT 37.3 02/16/2015   MCV 99.5 02/16/2015   PLT 192 02/16/2015   Impression:  Clinical stage IIA (T2  N0 M0), pathologic stage (ypT0, YpN0) invasive ductal/DCIS of the right breast.  I explained to the patient that her response to chemotherapy is an excellent prognostic factor.  We still feel that she needs radiation therapy.  We discussed the potential acute and late toxicities of radiation therapy.  Since she received chemotherapy, she will receive standard fractionation over 5-5-1/2 weeks.  She will not need a boost.  Consent is signed today.  I'll have her return for CT simulation within the next one to 2 weeks.  30 minutes was spent face-to-face with the patient, primarily counseling the patient and coordinating her care.

## 2015-02-17 NOTE — Telephone Encounter (Signed)
CALLED PATIENT TO INFORM OF NUTRITION APPT. FOR 02/23/15 @ 11:15 AM WITH BARBARA NEFF, LVM FOR A RETURN CALL

## 2015-02-17 NOTE — Progress Notes (Signed)
Please see the Nurse Progress Note in the MD Initial Consult Encounter for this patient. 

## 2015-02-21 ENCOUNTER — Ambulatory Visit
Admission: RE | Admit: 2015-02-21 | Discharge: 2015-02-21 | Disposition: A | Discharge status: Home / Self Care | Payer: 59 | Source: Ambulatory Visit | Attending: Radiation Oncology | Admitting: Radiation Oncology

## 2015-02-21 DIAGNOSIS — C50211 Malignant neoplasm of upper-inner quadrant of right female breast: Secondary | ICD-10-CM

## 2015-02-21 DIAGNOSIS — Z510 Encounter for antineoplastic radiation therapy: Secondary | ICD-10-CM | POA: Diagnosis not present

## 2015-02-21 NOTE — Progress Notes (Signed)
Complex simulation/treatment planning note: The patient was taken to the CT simulator.  A Vac lock immobilization device was constructed on a custom breast board.  Her right breast was marked with radiopaque wires along with her right partial mastectomy scar.  She was then scanned.  The CT data set was sent to the planning system where I contoured her tumor bed.  Her normal anatomy was contoured by dosimetry.  She was set up to medial and lateral right breast tangents with unique MLCs.  I'm prescribing 5040 cGy in 28 sessions.  No boost.  She is now ready for 3-D simulation.

## 2015-02-23 ENCOUNTER — Ambulatory Visit: Payer: 59 | Admitting: Nutrition

## 2015-02-23 NOTE — Progress Notes (Signed)
59 year old female diagnosed with triple A negative breast cancer.  She is a patient of Dr. Jana Hakim.  Past medical history includes glaucoma, anxiety, depression, GERD, and hepatitis.  Medications include Xanax, Pepcid, and Prozac.  Labs include albumin 3.3.  Height: 5 feet 1 inch. Weight: 163.6 pounds April 14. Usual body weight: 148 pounds January 2016. BMI: 30.93.  Patient complains of fatigue and back pain. States she has completed chemotherapy and is to start radiation treatment. Patient concerned with weight gain. Dietary recall reveals patient consumes sweetened beverages as such as Gatorade and Sweet Tea. Patient typically enjoys sweet foods/desserts. States she sleeps much of the day and has not been exercising.   Nutrition diagnosis: Food and nutrition related knowledge deficit related to breast cancer as evidenced by no prior need for nutrition related information.  Intervention:  Patient educated on the importance of consuming low-fat plant-based diet that is calorie and portion control. Educated patient on the importance of increasing physical activity with a goal of 150 minutes weekly. Encouraged patient to increase fruits and vegetables to 7 - 9 servings daily. Educated patient to reduce sweetened beverages and substitute water. Questions were answered.  Teach back method used.  Fact sheets provided.  Monitoring, evaluation, goals: Patient will tolerate low-fat diet to minimize weight gain and promote maintenance of lean body mass.  Next visit: No follow-up scheduled.  Patient has my contact information for questions.  **Disclaimer: This note was dictated with voice recognition software. Similar sounding words can inadvertently be transcribed and this note may contain transcription errors which may not have been corrected upon publication of note.**

## 2015-02-24 ENCOUNTER — Encounter: Payer: Self-pay | Admitting: Radiation Oncology

## 2015-02-24 ENCOUNTER — Encounter: Payer: Self-pay | Admitting: *Deleted

## 2015-02-24 DIAGNOSIS — Z510 Encounter for antineoplastic radiation therapy: Secondary | ICD-10-CM | POA: Diagnosis not present

## 2015-02-24 NOTE — Progress Notes (Signed)
3-D simulation note: The patient completed 3-D simulation for treatment to her right breast.  She is set up to medial and lateral right breast tangents.  5 unique MLCs are utilized to conform the field.  Dose volume histograms were obtained for the target structures/tumor bed and also avoidance structures including the lungs and heart.  We met our departmental guidelines.  I'm prescribing 5040 cGy 28 sessions utilizing 6 MV photons.

## 2015-02-24 NOTE — Progress Notes (Signed)
[facility] Psychosocial Distress Screening Clinical Social Work  Clinical Social Work was referred by distress screening protocol.  The patient scored a 6 on the Psychosocial Distress Thermometer which indicates moderate distress. Clinical Social Worker phoned pt to assess for distress and other psychosocial needs. CSW had worked with pt in the past. [place] had to leave message. CSW will try to check in at upcoming radonc appts.   ONCBCN DISTRESS SCREENING 02/17/2015  Screening Type Change in Status  Distress experienced in past week (1-10) 6  Practical problem type Insurance;Work/school  Emotional problem type Depression;Nervousness/Anxiety;Adjusting to illness;Isolation/feeling alone;Feeling hopeless;Boredom;Adjusting to appearance changes  Spiritual/Religous concerns type Relating to God;Loss of Faith;Facing my mortality;Loss of sense of purpose  Information Concerns Type Lack of info about diagnosis;Lack of info about treatment;Lack of info about complementary therapy choices;Lack of info about maintaining fitness  Physical Problem type Pain;Nausea/vomiting;Sleep/insomnia;Getting around;Bathing/dressing;Breathing;Talking;Constipation/diarrhea;Swollen arms/legs;Skin dry/itchy  Referral to clinical social work   Referral to support programs   Other "practical and emotional" problems listed as most distressing, may call    Clinical Social Worker follow up needed: Yes.    If yes, follow up plan:  See above Loren Racer, [place] Worker [place]  [facility] Phone: [phone] Fax: [phone]

## 2015-02-28 ENCOUNTER — Ambulatory Visit
Admission: RE | Admit: 2015-02-28 | Discharge: 2015-02-28 | Disposition: A | Discharge status: Home / Self Care | Payer: 59 | Source: Ambulatory Visit | Attending: Radiation Oncology | Admitting: Radiation Oncology

## 2015-02-28 DIAGNOSIS — Z510 Encounter for antineoplastic radiation therapy: Secondary | ICD-10-CM | POA: Diagnosis not present

## 2015-03-01 ENCOUNTER — Ambulatory Visit
Admission: RE | Admit: 2015-03-01 | Discharge: 2015-03-01 | Disposition: A | Discharge status: Home / Self Care | Payer: 59 | Source: Ambulatory Visit | Attending: Radiation Oncology | Admitting: Radiation Oncology

## 2015-03-01 DIAGNOSIS — Z510 Encounter for antineoplastic radiation therapy: Secondary | ICD-10-CM | POA: Diagnosis not present

## 2015-03-02 ENCOUNTER — Ambulatory Visit: Payer: 59

## 2015-03-02 ENCOUNTER — Ambulatory Visit
Admission: RE | Admit: 2015-03-02 | Discharge: 2015-03-02 | Disposition: A | Discharge status: Home / Self Care | Payer: 59 | Source: Ambulatory Visit | Attending: Radiation Oncology | Admitting: Radiation Oncology

## 2015-03-02 DIAGNOSIS — Z510 Encounter for antineoplastic radiation therapy: Secondary | ICD-10-CM | POA: Diagnosis not present

## 2015-03-03 ENCOUNTER — Ambulatory Visit: Payer: 59

## 2015-03-03 ENCOUNTER — Ambulatory Visit
Admission: RE | Admit: 2015-03-03 | Discharge: 2015-03-03 | Disposition: A | Discharge status: Home / Self Care | Payer: 59 | Source: Ambulatory Visit | Attending: Radiation Oncology | Admitting: Radiation Oncology

## 2015-03-03 DIAGNOSIS — Z510 Encounter for antineoplastic radiation therapy: Secondary | ICD-10-CM | POA: Diagnosis not present

## 2015-03-03 DIAGNOSIS — C50211 Malignant neoplasm of upper-inner quadrant of right female breast: Secondary | ICD-10-CM

## 2015-03-03 MED ORDER — RADIAPLEXRX EX GEL
Freq: Once | CUTANEOUS | Status: AC
  Administered 2015-03-03: 18:00:00 via TOPICAL

## 2015-03-03 MED ORDER — ALRA NON-METALLIC DEODORANT (RAD-ONC)
1.0000 "application " | Freq: Once | TOPICAL | Status: AC
  Administered 2015-03-03: 1 via TOPICAL

## 2015-03-03 NOTE — Progress Notes (Signed)
Patient education done  Radiation  Therapy and you book, alra, radiaplex gel, flyer on skin products, Patric Dykes, RN business card, discussed ways to manage side effects, skin irritation, swelling, tenderness,  Hair loss where rad tx is done, fatigue, pain, increase protein in diet, drink plenty water,stay hydrated, sees MD/staff RN weekly and prn, teach back given, all questions answered 5:41 PM

## 2015-03-04 ENCOUNTER — Ambulatory Visit
Admission: RE | Admit: 2015-03-04 | Discharge: 2015-03-04 | Disposition: A | Discharge status: Home / Self Care | Payer: 59 | Source: Ambulatory Visit | Attending: Radiation Oncology | Admitting: Radiation Oncology

## 2015-03-04 ENCOUNTER — Ambulatory Visit: Payer: 59

## 2015-03-04 DIAGNOSIS — Z510 Encounter for antineoplastic radiation therapy: Secondary | ICD-10-CM | POA: Diagnosis not present

## 2015-03-07 ENCOUNTER — Ambulatory Visit: Payer: 59

## 2015-03-07 ENCOUNTER — Ambulatory Visit
Admission: RE | Admit: 2015-03-07 | Discharge: 2015-03-07 | Disposition: A | Discharge status: Home / Self Care | Payer: 59 | Source: Ambulatory Visit | Attending: Radiation Oncology | Admitting: Radiation Oncology

## 2015-03-07 VITALS — BP 117/84 | HR 117 | Temp 98.8°F | Resp 12 | Wt 160.8 lb

## 2015-03-07 DIAGNOSIS — Z510 Encounter for antineoplastic radiation therapy: Secondary | ICD-10-CM | POA: Diagnosis not present

## 2015-03-07 DIAGNOSIS — C50211 Malignant neoplasm of upper-inner quadrant of right female breast: Secondary | ICD-10-CM

## 2015-03-07 NOTE — Progress Notes (Signed)
Weekly Management Note:  Site: Right breast Current Dose:  900  cGy Projected Dose: 5040  cGy  Narrative: The patient is seen today for routine under treatment assessment. CBCT/MVCT images/port films were reviewed. The chart was reviewed.   She is without complaints today except for slight right breast tenderness.  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  She uses Radioplex gel along her breast.  Physical Examination:  Filed Vitals:   03/07/15 1222  BP: 117/84  Pulse: 117  Temp: 98.8 F (37.1 C)  Resp: 12  .  Weight: 160 lb 12.8 oz (72.938 kg).  There is mild erythema of the breast with no desquamation.  Fungal infection appears to be unchanged.  Impression: Tolerating radiation therapy well.  I told her that it may take months before her fungal infection clears.  Plan: Continue radiation therapy as planned.

## 2015-03-07 NOTE — Progress Notes (Addendum)
She rates her pain as a 4 on a scale of 0-10. intermittent and aching over left shoulder. Pt complains of fatigue .  Pt right breast- positive for breast tenderness. Noted an multiple areas of spotty erythema under bilateral breast folds, which she reports "itching."  Pt reports she has been using Nystatin powder, without relief.   Pt denies edema. Pt continues to apply Radiaplex as directed. BP 117/84 mmHg  Pulse 117  Temp(Src) 98.8 F (37.1 C) (Oral)  Resp 12  Wt 160 lb 12.8 oz (72.938 kg)  SpO2 97%

## 2015-03-08 ENCOUNTER — Ambulatory Visit
Admission: RE | Admit: 2015-03-08 | Discharge: 2015-03-08 | Disposition: A | Discharge status: Home / Self Care | Payer: 59 | Source: Ambulatory Visit | Attending: Radiation Oncology | Admitting: Radiation Oncology

## 2015-03-08 ENCOUNTER — Ambulatory Visit: Payer: 59

## 2015-03-08 DIAGNOSIS — Z510 Encounter for antineoplastic radiation therapy: Secondary | ICD-10-CM | POA: Diagnosis not present

## 2015-03-09 ENCOUNTER — Ambulatory Visit: Payer: 59

## 2015-03-09 ENCOUNTER — Ambulatory Visit
Admission: RE | Admit: 2015-03-09 | Discharge: 2015-03-09 | Disposition: A | Discharge status: Home / Self Care | Payer: 59 | Source: Ambulatory Visit | Attending: Radiation Oncology | Admitting: Radiation Oncology

## 2015-03-09 DIAGNOSIS — Z510 Encounter for antineoplastic radiation therapy: Secondary | ICD-10-CM | POA: Diagnosis not present

## 2015-03-10 ENCOUNTER — Ambulatory Visit
Admission: RE | Admit: 2015-03-10 | Discharge: 2015-03-10 | Disposition: A | Discharge status: Home / Self Care | Payer: 59 | Source: Ambulatory Visit | Attending: Radiation Oncology | Admitting: Radiation Oncology

## 2015-03-10 ENCOUNTER — Ambulatory Visit: Payer: 59

## 2015-03-10 DIAGNOSIS — Z510 Encounter for antineoplastic radiation therapy: Secondary | ICD-10-CM | POA: Diagnosis not present

## 2015-03-11 ENCOUNTER — Ambulatory Visit
Admission: RE | Admit: 2015-03-11 | Discharge: 2015-03-11 | Disposition: A | Discharge status: Home / Self Care | Payer: 59 | Source: Ambulatory Visit | Attending: Radiation Oncology | Admitting: Radiation Oncology

## 2015-03-11 ENCOUNTER — Ambulatory Visit: Payer: 59

## 2015-03-11 DIAGNOSIS — Z510 Encounter for antineoplastic radiation therapy: Secondary | ICD-10-CM | POA: Diagnosis not present

## 2015-03-14 ENCOUNTER — Ambulatory Visit
Admission: RE | Admit: 2015-03-14 | Discharge: 2015-03-14 | Disposition: A | Discharge status: Home / Self Care | Payer: 59 | Source: Ambulatory Visit | Attending: Radiation Oncology | Admitting: Radiation Oncology

## 2015-03-14 ENCOUNTER — Ambulatory Visit: Payer: 59

## 2015-03-14 ENCOUNTER — Encounter: Payer: Self-pay | Admitting: Radiation Oncology

## 2015-03-14 VITALS — BP 109/67 | HR 107 | Temp 98.5°F | Ht 61.0 in | Wt 158.8 lb

## 2015-03-14 DIAGNOSIS — C50211 Malignant neoplasm of upper-inner quadrant of right female breast: Secondary | ICD-10-CM

## 2015-03-14 DIAGNOSIS — Z510 Encounter for antineoplastic radiation therapy: Secondary | ICD-10-CM | POA: Diagnosis not present

## 2015-03-14 NOTE — Progress Notes (Signed)
Melissa Garza has received 10 fractions to her right breast.  Note rash-like appearance in the upper, inner portion of her right breast with mild erythema.  She admits to fatigue, which is unchanged since starting radiation therapy.

## 2015-03-14 NOTE — Progress Notes (Signed)
Weekly Management Note:  Site: Right breast Current Dose:  1800  cGy Projected Dose: 5040  cGy  Narrative: The patient is seen today for routine under treatment assessment. CBCT/MVCT images/port films were reviewed. The chart was reviewed.   She is without complaints today except for left shoulder discomfort.  She has been taking ibuprofen when necessary.  Physical Examination:  Filed Vitals:   03/14/15 1223  BP: 109/67  Pulse: 107  Temp: 98.5 F (36.9 C)  .  Weight: 158 lb 12.8 oz (72.031 kg).  There is mild erythema along the right breast with no areas of desquamation.  There is point tenderness on palpation of her left acromioclavicular joint.  Impression: Tolerating radiation therapy well.  I told her to see her primary care physician or an orthopedist regarding her left shoulder discomfort if this does not improve the near future.  Plan: Continue radiation therapy as planned.

## 2015-03-15 ENCOUNTER — Ambulatory Visit
Admission: RE | Admit: 2015-03-15 | Discharge: 2015-03-15 | Disposition: A | Discharge status: Home / Self Care | Payer: 59 | Source: Ambulatory Visit | Attending: Radiation Oncology | Admitting: Radiation Oncology

## 2015-03-15 ENCOUNTER — Ambulatory Visit: Payer: 59

## 2015-03-15 DIAGNOSIS — Z510 Encounter for antineoplastic radiation therapy: Secondary | ICD-10-CM | POA: Diagnosis not present

## 2015-03-16 ENCOUNTER — Ambulatory Visit
Admission: RE | Admit: 2015-03-16 | Discharge: 2015-03-16 | Disposition: A | Discharge status: Home / Self Care | Payer: 59 | Source: Ambulatory Visit | Attending: Radiation Oncology | Admitting: Radiation Oncology

## 2015-03-16 ENCOUNTER — Ambulatory Visit: Payer: 59

## 2015-03-16 DIAGNOSIS — Z510 Encounter for antineoplastic radiation therapy: Secondary | ICD-10-CM | POA: Diagnosis not present

## 2015-03-17 ENCOUNTER — Ambulatory Visit
Admission: RE | Admit: 2015-03-17 | Discharge: 2015-03-17 | Disposition: A | Discharge status: Home / Self Care | Payer: 59 | Source: Ambulatory Visit | Attending: Radiation Oncology | Admitting: Radiation Oncology

## 2015-03-17 ENCOUNTER — Ambulatory Visit: Payer: 59

## 2015-03-17 DIAGNOSIS — Z510 Encounter for antineoplastic radiation therapy: Secondary | ICD-10-CM | POA: Diagnosis not present

## 2015-03-18 ENCOUNTER — Ambulatory Visit
Admission: RE | Admit: 2015-03-18 | Discharge: 2015-03-18 | Disposition: A | Discharge status: Home / Self Care | Payer: 59 | Source: Ambulatory Visit | Attending: Radiation Oncology | Admitting: Radiation Oncology

## 2015-03-18 ENCOUNTER — Ambulatory Visit: Payer: 59

## 2015-03-18 DIAGNOSIS — Z510 Encounter for antineoplastic radiation therapy: Secondary | ICD-10-CM | POA: Diagnosis not present

## 2015-03-21 ENCOUNTER — Ambulatory Visit: Payer: 59

## 2015-03-21 ENCOUNTER — Encounter: Payer: Self-pay | Admitting: Radiation Oncology

## 2015-03-21 ENCOUNTER — Ambulatory Visit
Admission: RE | Admit: 2015-03-21 | Discharge: 2015-03-21 | Disposition: A | Discharge status: Home / Self Care | Payer: Self-pay | Source: Ambulatory Visit | Attending: Radiation Oncology | Admitting: Radiation Oncology

## 2015-03-21 ENCOUNTER — Ambulatory Visit
Admission: RE | Admit: 2015-03-21 | Discharge: 2015-03-21 | Disposition: A | Discharge status: Home / Self Care | Payer: 59 | Source: Ambulatory Visit | Attending: Radiation Oncology | Admitting: Radiation Oncology

## 2015-03-21 VITALS — BP 119/77 | HR 105 | Temp 97.9°F | Ht 61.0 in | Wt 159.5 lb

## 2015-03-21 DIAGNOSIS — Z510 Encounter for antineoplastic radiation therapy: Secondary | ICD-10-CM | POA: Diagnosis not present

## 2015-03-21 DIAGNOSIS — C50211 Malignant neoplasm of upper-inner quadrant of right female breast: Secondary | ICD-10-CM

## 2015-03-21 NOTE — Progress Notes (Signed)
Weekly Management Note:  Site: Right breast Current Dose:  2700  cGy Projected Dose: 5040  cGy  Narrative: The patient is seen today for routine under treatment assessment. CBCT/MVCT images/port films were reviewed. The chart was reviewed.   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  She uses Radioplex gel along her right breast.  Physical Examination:  Filed Vitals:   03/21/15 1014  BP: 119/77  Pulse: 105  Temp: 97.9 F (36.6 C)  .  Weight: 159 lb 8 oz (72.349 kg).  There is mild to moderate erythema/hyperpigmentation the skin along the right breast.  Her inframammary fungal reaction on the right looks improved.  However, along the left but does look somewhat worse.  Her skin is moist on the left.  Impression: Tolerating radiation therapy well.  I suspect that her left breast fungal infection is worse because of moisture.  I told her to apply miconazole powder along the left breast inframammary region at least 2-3 times a day.  Plan: Continue radiation therapy as planned.

## 2015-03-21 NOTE — Progress Notes (Signed)
Ms. Melissa Garza hs received 15 fractions to her right breast.  Note increased redness in both inframmary folds, worse on the left despite using antifungal powder.  Erythema with a rash-like appearance to the right breast, mainly in the upper inner portion of the breast. She continues to voice concerns regarding pain in the area underneath her left clavicle and anterior posterior should.

## 2015-03-22 ENCOUNTER — Ambulatory Visit: Payer: 59

## 2015-03-22 ENCOUNTER — Ambulatory Visit
Admission: RE | Admit: 2015-03-22 | Discharge: 2015-03-22 | Disposition: A | Discharge status: Home / Self Care | Payer: 59 | Source: Ambulatory Visit | Attending: Radiation Oncology | Admitting: Radiation Oncology

## 2015-03-22 DIAGNOSIS — Z510 Encounter for antineoplastic radiation therapy: Secondary | ICD-10-CM | POA: Diagnosis not present

## 2015-03-23 ENCOUNTER — Ambulatory Visit
Admission: RE | Admit: 2015-03-23 | Discharge: 2015-03-23 | Disposition: A | Discharge status: Home / Self Care | Payer: 59 | Source: Ambulatory Visit | Attending: Radiation Oncology | Admitting: Radiation Oncology

## 2015-03-23 ENCOUNTER — Ambulatory Visit: Payer: 59

## 2015-03-23 DIAGNOSIS — Z510 Encounter for antineoplastic radiation therapy: Secondary | ICD-10-CM | POA: Diagnosis not present

## 2015-03-24 ENCOUNTER — Ambulatory Visit
Admission: RE | Admit: 2015-03-24 | Discharge: 2015-03-24 | Disposition: A | Discharge status: Home / Self Care | Payer: 59 | Source: Ambulatory Visit | Attending: Radiation Oncology | Admitting: Radiation Oncology

## 2015-03-24 ENCOUNTER — Ambulatory Visit: Payer: 59

## 2015-03-24 DIAGNOSIS — Z510 Encounter for antineoplastic radiation therapy: Secondary | ICD-10-CM | POA: Diagnosis not present

## 2015-03-25 ENCOUNTER — Ambulatory Visit
Admission: RE | Admit: 2015-03-25 | Discharge: 2015-03-25 | Disposition: A | Discharge status: Home / Self Care | Payer: 59 | Source: Ambulatory Visit | Attending: Radiation Oncology | Admitting: Radiation Oncology

## 2015-03-25 ENCOUNTER — Ambulatory Visit: Payer: 59

## 2015-03-25 ENCOUNTER — Other Ambulatory Visit: Payer: Self-pay | Admitting: Obstetrics and Gynecology

## 2015-03-25 DIAGNOSIS — Z510 Encounter for antineoplastic radiation therapy: Secondary | ICD-10-CM | POA: Diagnosis not present

## 2015-03-28 ENCOUNTER — Ambulatory Visit
Admission: RE | Admit: 2015-03-28 | Discharge: 2015-03-28 | Disposition: A | Discharge status: Home / Self Care | Payer: 59 | Source: Ambulatory Visit | Attending: Radiation Oncology | Admitting: Radiation Oncology

## 2015-03-28 ENCOUNTER — Encounter: Payer: Self-pay | Admitting: Radiation Oncology

## 2015-03-28 ENCOUNTER — Ambulatory Visit: Payer: 59

## 2015-03-28 VITALS — BP 93/56 | HR 103 | Temp 98.1°F | Ht 61.0 in | Wt 157.8 lb

## 2015-03-28 DIAGNOSIS — C50211 Malignant neoplasm of upper-inner quadrant of right female breast: Secondary | ICD-10-CM | POA: Diagnosis not present

## 2015-03-28 DIAGNOSIS — L598 Other specified disorders of the skin and subcutaneous tissue related to radiation: Secondary | ICD-10-CM | POA: Insufficient documentation

## 2015-03-28 DIAGNOSIS — Z510 Encounter for antineoplastic radiation therapy: Secondary | ICD-10-CM | POA: Insufficient documentation

## 2015-03-28 LAB — CYTOLOGY - PAP

## 2015-03-28 MED ORDER — BIAFINE EX EMUL
CUTANEOUS | Status: DC | PRN
  Administered 2015-03-28: 13:00:00 via TOPICAL

## 2015-03-28 NOTE — Progress Notes (Signed)
Weekly Management Note:  Site: Right breast Current Dose:  3600  cGy Projected Dose: 5040  cGy  Narrative: The patient is seen today for routine under treatment assessment. CBCT/MVCT images/port films were reviewed. The chart was reviewed.   She complains of more pruritus related to worsening dermatitis along the right breast.  She has been using Radioplex gel.  Physical Examination:  Filed Vitals:   03/28/15 1058  BP: 93/56  Pulse: 103  Temp: 98.1 F (36.7 C)  .  Weight: 157 lb 12.8 oz (71.578 kg).  There is erythema along the entire right breast with a papular radiation dermatitis, more so along the upper inner quadrant.  There is no obvious dry desquamation, although the skin is dry.  Impression: Tolerating radiation therapy well, except for papular radiation dermatitis.  We will change to Biafine cream.  If she still has pruritus she may use hydrocortisone cream.  Plan: Continue radiation therapy as planned.

## 2015-03-28 NOTE — Addendum Note (Signed)
Encounter addended by: Benn Moulder, RN on: 03/28/2015  1:25 PM<BR>     Documentation filed: Inpatient MAR

## 2015-03-28 NOTE — Addendum Note (Signed)
Encounter addended by: Benn Moulder, RN on: 03/28/2015 12:52 PM<BR>     Documentation filed: Orders, Dx Association

## 2015-03-28 NOTE — Progress Notes (Addendum)
Melissa Garza has received 20 fractions to her right breast.  Bright erythema and rash like appearance to entire breast.  C/o itching in this area in this area and is using and is using radiaplex with minimal relief.

## 2015-03-29 ENCOUNTER — Ambulatory Visit
Admission: RE | Admit: 2015-03-29 | Discharge: 2015-03-29 | Disposition: A | Discharge status: Home / Self Care | Payer: 59 | Source: Ambulatory Visit | Attending: Radiation Oncology | Admitting: Radiation Oncology

## 2015-03-29 ENCOUNTER — Ambulatory Visit: Payer: 59

## 2015-03-29 DIAGNOSIS — Z510 Encounter for antineoplastic radiation therapy: Secondary | ICD-10-CM | POA: Diagnosis not present

## 2015-03-30 ENCOUNTER — Ambulatory Visit: Payer: 59

## 2015-03-30 ENCOUNTER — Ambulatory Visit
Admission: RE | Admit: 2015-03-30 | Discharge: 2015-03-30 | Disposition: A | Discharge status: Home / Self Care | Payer: 59 | Source: Ambulatory Visit | Attending: Radiation Oncology | Admitting: Radiation Oncology

## 2015-03-30 DIAGNOSIS — Z510 Encounter for antineoplastic radiation therapy: Secondary | ICD-10-CM | POA: Diagnosis not present

## 2015-03-31 ENCOUNTER — Ambulatory Visit
Admission: RE | Admit: 2015-03-31 | Discharge: 2015-03-31 | Disposition: A | Discharge status: Home / Self Care | Payer: 59 | Source: Ambulatory Visit | Attending: Radiation Oncology | Admitting: Radiation Oncology

## 2015-03-31 ENCOUNTER — Ambulatory Visit: Payer: 59

## 2015-03-31 DIAGNOSIS — Z510 Encounter for antineoplastic radiation therapy: Secondary | ICD-10-CM | POA: Diagnosis not present

## 2015-04-01 ENCOUNTER — Ambulatory Visit
Admission: RE | Admit: 2015-04-01 | Discharge: 2015-04-01 | Disposition: A | Discharge status: Home / Self Care | Payer: 59 | Source: Ambulatory Visit | Attending: Radiation Oncology | Admitting: Radiation Oncology

## 2015-04-01 ENCOUNTER — Ambulatory Visit: Payer: 59

## 2015-04-01 DIAGNOSIS — Z510 Encounter for antineoplastic radiation therapy: Secondary | ICD-10-CM | POA: Diagnosis not present

## 2015-04-05 ENCOUNTER — Encounter: Payer: Self-pay | Admitting: Radiation Oncology

## 2015-04-05 ENCOUNTER — Ambulatory Visit: Payer: 59

## 2015-04-05 ENCOUNTER — Ambulatory Visit
Admission: RE | Admit: 2015-04-05 | Discharge: 2015-04-05 | Disposition: A | Discharge status: Home / Self Care | Payer: 59 | Source: Ambulatory Visit | Attending: Radiation Oncology | Admitting: Radiation Oncology

## 2015-04-05 VITALS — BP 114/70 | HR 86 | Temp 98.0°F | Ht 61.0 in | Wt 158.7 lb

## 2015-04-05 DIAGNOSIS — Z510 Encounter for antineoplastic radiation therapy: Secondary | ICD-10-CM | POA: Diagnosis not present

## 2015-04-05 DIAGNOSIS — C50211 Malignant neoplasm of upper-inner quadrant of right female breast: Secondary | ICD-10-CM

## 2015-04-05 NOTE — Progress Notes (Signed)
Weekly Management Note:  Site: Right breast Current Dose:  4500  cGy Projected Dose: 5040  cGy  Narrative: The patient is seen today for routine under treatment assessment. CBCT/MVCT images/port films were reviewed. The chart was reviewed.   She is having more pruritus along her breast which responds to Biafine cream.  She does have moderate axillary discomfort.  She uses miconazole powder for her left inframammary fungal infection.  Physical Examination:  Filed Vitals:   04/05/15 1103  BP: 114/70  Pulse: 86  Temp: 98 F (36.7 C)  .  Weight: 158 lb 11.2 oz (71.986 kg).  There is moderate erythema along the right breast with patchy dry desquamation.  The radiation dermatitis is more papular along the upper inner quadrant as noted previously.  No areas of moist desquamation.  Impression: Tolerating radiation therapy well.  She'll continue with Biafine.  I will see her this Thursday to check her skin before she finishes her treatment this Friday.  Plan: Continue radiation therapy as planned.

## 2015-04-05 NOTE — Progress Notes (Signed)
Ms. Bodner has received 25 fractions t oher right breast.  Note diffuse rash in the entire right breast which she reports is very "itchy".  Right axillary desquamation noted, but without any drainage and grades pain in this area as a level 5/10.  Continues to have thickened red, rash like area in the left inframmary fold that has not significantly changed since using antifungal powder.

## 2015-04-06 ENCOUNTER — Ambulatory Visit
Admission: RE | Admit: 2015-04-06 | Discharge: 2015-04-06 | Disposition: A | Discharge status: Home / Self Care | Payer: 59 | Source: Ambulatory Visit | Attending: Radiation Oncology | Admitting: Radiation Oncology

## 2015-04-06 ENCOUNTER — Ambulatory Visit: Payer: 59

## 2015-04-06 DIAGNOSIS — Z510 Encounter for antineoplastic radiation therapy: Secondary | ICD-10-CM | POA: Diagnosis not present

## 2015-04-07 ENCOUNTER — Encounter: Payer: Self-pay | Admitting: Radiation Oncology

## 2015-04-07 ENCOUNTER — Ambulatory Visit
Admission: RE | Admit: 2015-04-07 | Discharge: 2015-04-07 | Disposition: A | Discharge status: Home / Self Care | Payer: 59 | Source: Ambulatory Visit | Attending: Radiation Oncology | Admitting: Radiation Oncology

## 2015-04-07 ENCOUNTER — Ambulatory Visit: Payer: 59

## 2015-04-07 DIAGNOSIS — Z510 Encounter for antineoplastic radiation therapy: Secondary | ICD-10-CM | POA: Diagnosis not present

## 2015-04-07 NOTE — Progress Notes (Signed)
Weekly Management Note:  Site: Right breast Current Dose:  4860  cGy Projected Dose: 5040  cGy  Narrative: The patient is seen today for routine under treatment assessment. CBCT/MVCT images/port films were reviewed. The chart was reviewed.   She is without new complaints today.  She has been using Biafine cream.  Physical Examination: There were no vitals filed for this visit..  Weight:  .  There continues to be diffuse erythema along the right breast with dry desquamation.  No areas of moist desquamation.  Impression: Tolerating radiation therapy well.  She will finish her radiation therapy tomorrow.  Plan: One-month follow-up visit after completion of radiation therapy.

## 2015-04-08 ENCOUNTER — Ambulatory Visit
Admission: RE | Admit: 2015-04-08 | Discharge: 2015-04-08 | Disposition: A | Discharge status: Home / Self Care | Payer: 59 | Source: Ambulatory Visit | Attending: Radiation Oncology | Admitting: Radiation Oncology

## 2015-04-08 ENCOUNTER — Ambulatory Visit: Payer: 59

## 2015-04-08 DIAGNOSIS — Z510 Encounter for antineoplastic radiation therapy: Secondary | ICD-10-CM | POA: Diagnosis not present

## 2015-04-11 ENCOUNTER — Encounter: Payer: Self-pay | Admitting: Radiation Oncology

## 2015-04-11 NOTE — Progress Notes (Signed)
[facility] End of Treatment Note  Name:Melissa Garza  Date: 04/11/2015 VQX:[id number] DOB:1956/02/03   Status:outpatient    CC: No PCP Per Patient  , Dr. Adonis Housekeeper  REFERRING PHYSICIAN: Dr. 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  She also had bilateral inframammary fungal infections treated with miconazole powder.                         PLAN: Routine followup in one month. Patient instructed to call if questions or worsening complaints in interim.

## 2015-05-06 ENCOUNTER — Encounter: Payer: Self-pay | Admitting: Radiation Oncology

## 2015-05-10 ENCOUNTER — Telehealth: Payer: Self-pay | Admitting: *Deleted

## 2015-05-10 ENCOUNTER — Ambulatory Visit
Admission: RE | Admit: 2015-05-10 | Discharge: 2015-05-10 | Disposition: A | Discharge status: Home / Self Care | Payer: 59 | Source: Ambulatory Visit | Attending: Radiation Oncology | Admitting: Radiation Oncology

## 2015-05-10 ENCOUNTER — Encounter: Payer: Self-pay | Admitting: Radiation Oncology

## 2015-05-10 VITALS — BP 105/72 | HR 100 | Temp 97.7°F | Resp 20 | Ht 61.0 in | Wt 161.3 lb

## 2015-05-10 DIAGNOSIS — C50211 Malignant neoplasm of upper-inner quadrant of right female breast: Secondary | ICD-10-CM

## 2015-05-10 HISTORY — DX: Personal history of irradiation: Z92.3

## 2015-05-10 NOTE — Progress Notes (Signed)
CC: Dr. Adonis Housekeeper  Follow-up note:  Melissa Garza returns today approximately 1 month following completion of radiation therapy in the management of her clinical stage T2 N0 invasive ductal/DCIS of the right breast.  She underwent a complete pathologic complete response following new adjuvant chemotherapy.  She still doing well and is still using Biafine cream for her radiation dermatitis.  Her skin is much improved.  She sees Dr. Jana Hakim for a follow-up visit on August 2.  She is getting ready to return to work by July 18.  Her fatigue is improved.  She is taking Paxil for hot flashes, but this is not satisfactory.  Physical examination: Alert and oriented. Filed Vitals:   05/10/15 0743  BP: 105/72  Pulse: 100  Temp: 97.7 F (36.5 C)  Resp: 20   Head and neck examination: Grossly unremarkable.  Nodes: Without palpable cervical, supraclavicular, or axillary lymphadenopathy.  Breasts: There is residual hyperpigmentation the skin along the right breast with no areas of desquamation.  There is minimal thickening of the right breast.  No masses are appreciated.  Left breast without masses or lesions.  There is a fungal skin reaction along the left inframammary fold.  Extremities: Without edema.  Impression: Satisfactory progress.  I feel that she can resume work by July 18.  I filled out her disability papers as best I could.  Plan: As above.  She will see Dr. Jana Hakim for a follow-up visit on August 2.  I told her that she should have follow-up mammography by late September.  I've not scheduled the patient for a formal follow-up visit, but I would be more than happy to see her in the future should the need arise.

## 2015-05-10 NOTE — Telephone Encounter (Signed)
Follow up s/p right breat rad txs, 03/01/15-04/08/15, breast well healed, patient is taking paxil for hot flashes not helping, but is also on prozac,  Energy level tired at times, appetite good 7:42 AM

## 2015-05-10 NOTE — Progress Notes (Signed)
Follow up s/p rad txs compleetd right breast 04/08/15, well healed breast, still having hot flashes, on paxil not helping stated, also on prozac, ran out of gabapentin, energy level tired at times, appetite good 7:47 AM BP 105/72 mmHg  Pulse 100  Temp(Src) 97.7 F (36.5 C) (Oral)  Resp 20  Ht 5\' 1"  (1.549 m)  Wt 161 lb 4.8 oz (73.165 kg)  BMI 30.49 kg/m2  Wt Readings from Last 3 Encounters:  05/10/15 161 lb 4.8 oz (73.165 kg)  04/05/15 158 lb 11.2 oz (71.986 kg)  03/28/15 157 lb 12.8 oz (71.578 kg)

## 2015-05-25 ENCOUNTER — Telehealth: Payer: Self-pay | Admitting: *Deleted

## 2015-05-25 NOTE — Telephone Encounter (Signed)
Ms. Hursey called with report of increased size of a lump under her breast which was present during her XRT treatment phase.  She states that the area is red and at one point on yesterday there was dampness under her breast and she noted a "whitish liquid" but at this time it only feels moist.  Requested she check her temp and it was 98.0.

## 2015-05-25 NOTE — Telephone Encounter (Signed)
I spoke with patient and I should see her to assess her skin. I am not sure whether or not she is describing an abscess. I requested that she come in tomorrow (Thursday 7-21) at 3:30 to see me at 3:45.  RM

## 2015-05-26 ENCOUNTER — Telehealth: Payer: Self-pay | Admitting: *Deleted

## 2015-05-26 ENCOUNTER — Encounter: Payer: Self-pay | Admitting: Radiation Oncology

## 2015-05-26 ENCOUNTER — Ambulatory Visit
Admission: RE | Admit: 2015-05-26 | Discharge: 2015-05-26 | Disposition: A | Discharge status: Home / Self Care | Payer: 59 | Source: Ambulatory Visit | Attending: Radiation Oncology | Admitting: Radiation Oncology

## 2015-05-26 VITALS — BP 123/75 | HR 85 | Temp 97.9°F | Ht 61.0 in | Wt 162.5 lb

## 2015-05-26 DIAGNOSIS — C50211 Malignant neoplasm of upper-inner quadrant of right female breast: Secondary | ICD-10-CM

## 2015-05-26 NOTE — Telephone Encounter (Addendum)
Called Ms. Melissa Garza for a status check  She states that she continues to have drainage from the right breast, otherwise no new changes. She has an appt. With [name] today and requested she arrive at 3:30pm, and wiil be evaluated by [name]  at 3:45pm.  She stated agreement.

## 2015-05-26 NOTE — Progress Notes (Signed)
CC: Dr. Adonis Housekeeper  Follow-up note:  Ms. Brager called yesterday to state that she was having some drainage from her right axilla which she described as "whitish liquid".  She has been afebrile.  She is seen today.  His examination: There is a 1 cm a boil along the lower right axilla without surrounding erythema.  It looks to have been draining and there is no current drainage.  There is also a very small 3 mm pimple superior to this within the axilla.  There is no evidence of cellulitis.  Impression: I wonder if she has infected hair follicles.  I told her to apply warm compresses 3 times a day and then to apply antibiotic ointment.  If this does not improve by Monday or she develops fever/chills she is to contact Dr. Excell Seltzer for possible I&D.

## 2015-06-07 ENCOUNTER — Telehealth: Payer: Self-pay | Admitting: Oncology

## 2015-06-07 ENCOUNTER — Ambulatory Visit (HOSPITAL_BASED_OUTPATIENT_CLINIC_OR_DEPARTMENT_OTHER): Payer: 59 | Admitting: Oncology

## 2015-06-07 ENCOUNTER — Telehealth: Payer: Self-pay | Admitting: General Practice

## 2015-06-07 VITALS — BP 125/75 | HR 88 | Temp 98.5°F | Resp 18 | Ht 61.0 in | Wt 167.0 lb

## 2015-06-07 DIAGNOSIS — R5383 Other fatigue: Secondary | ICD-10-CM | POA: Diagnosis not present

## 2015-06-07 DIAGNOSIS — C50211 Malignant neoplasm of upper-inner quadrant of right female breast: Secondary | ICD-10-CM

## 2015-06-07 DIAGNOSIS — B379 Candidiasis, unspecified: Secondary | ICD-10-CM

## 2015-06-07 DIAGNOSIS — N951 Menopausal and female climacteric states: Secondary | ICD-10-CM

## 2015-06-07 MED ORDER — KETOCONAZOLE 2 % EX CREA: 1.0000 "application " | TOPICAL_CREAM | Freq: Every day | CUTANEOUS | Status: DC

## 2015-06-07 NOTE — Progress Notes (Signed)
[facility]  [phone] [phone]     ID: Melissa Garza DOB: 05-03-1956  MR#: [id number]  JYN#:[id number]  Patient Care Team: No Pcp Per Patient as PCP - General (General Practice) Harle Battiest, MD as Consulting Physician (Obstetrics and Gynecology) Excell Seltzer, MD as Consulting Physician (General Surgery) Chauncey Cruel, MD as Consulting Physician (Oncology) Arloa Koh, MD as Consulting Physician (Radiation Oncology) OTHER MD: Melissa May MD  CHIEF COMPLAINT: Triple negative breast cancer  CURRENT TREATMENT: Observation  BREAST CANCER HISTORY: From the original intake note:  Melissa Garza herself palpated a mass in her right breast approximately mid-September. She brought this to her gynecologist attention and he set her up for bilateral diagnostic mammography at the breast center 08/04/2014. The breast density was category C. in the patient has bilateral saline implants in place. In the palpable area of concern in the right breast there was an irregular mass measuring up to 2.5 cm. By palpation this was firm at the 12:30 o'clock position. Ultrasound of the right breast confirmed an irregular hypoechoic mass in this area measuring 2.5 cm. There was no right axillary lymphadenopathy noted.  Biopsy of the mass in question 08/04/2014 showed ([id number]) invasive ductal carcinoma, grade 3, triple negative, with an MIB-1 of 90%.  Bilateral breast MRI 08/09/2014 showed in the upper inner quadrant of the right breast an irregular enhancing mass measuring 2.8 cm. There was a 4 mm satellite nodule anterior to this and separated from that by 0.6 cm. The left breast, the remaining of the right breast and the lymph node areas were negative.  The patient's subsequent history is as detailed below  INTERVAL HISTORY: Melissa Garza returns today for follow up of her breast cancer. Since her last visit with me she completed her radiation treatment. Generally she  did well with those. Severe fatigue and discrimination were not significant issues.  ROS:  Melissa Garza however. She is going to be going back to work next week and wonders how well she will do. She denies significant brain "fogginess". They have been no unusual headaches, visual changes, cough, phlegm production, or pleurisy. There's been no change in bowel or bladder habits. She is having more hot flashes than before and has been started on paroxetine for this. It did not have much effect so her dose was increased to 20 mg. She is also on trazodone and fluoxetine. She tells me her psychiatrist is aware of this combination. A detailed review of systems today was otherwise stable.  PAST MEDICAL HISTORY: Past Medical History  Diagnosis Date  . Glaucoma   . Anxiety   . Hot flashes   . Depression   . PONV (postoperative nausea and vomiting)   . Headache     chronic  . Shortness of breath     with exertion  . GERD (gastroesophageal reflux disease)     PMH  . Cancer     cancer of right breast  . Hepatitis     Hx: of Hepatitis not sure which one  . S/P radiation therapy 03/01/2015 through 06/03/201604/26/2016 through 04/08/2015      Right breast 5040 cGy in 28 sessions (no boost)    PAST SURGICAL HISTORY: Past Surgical History  Procedure Laterality Date  . Tonsilectomy/adenoidectomy with myringotomy    . Breast enhancement surgery  2008  . Eye surgery      Lasik  . Colonoscopy    . Breast surgery      Biopsy  .  Dilation and curettage of uterus    . Tubal ligation    . Portacath placement N/A 08/13/2014    Procedure: INSERTION PORT-A-CATH;  Surgeon: Excell Seltzer, MD;  Location: [facility];  Service: General;  Laterality: N/A;  . Port-a-cath removal N/A 02/01/2015    Procedure: REMOVAL PORT-A-CATH;  Surgeon: Excell Seltzer, MD;  Location: [facility];   Service: General;  Laterality: N/A;    FAMILY HISTORY Family History  Problem Relation Age of Onset  . Psoriasis Mother   . Hypertension Father   . Asthma Other   . Thyroid disease Other    The patient's parents are currently alive, in their late 79s. The patient had no brothers, 2 sisters. There is no history of breast or ovarian cancer in the family to her knowledge.   GYNECOLOGIC HISTORY:  No LMP recorded. Patient is postmenopausal. Menarche age 21, first live birth age 21. She is GX P2. She stopped having periods in 2005 and started hormone replacement at that time. 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 Her husband, Melissa Garza, is disabled secondary to multiple cardiac problems. Son Melissa Garza lives in [place] and works in apartment maintenance. Son Melissa Garza Melissa Garza is his wife) works as an Clinical biochemist. The patient has 6 grandchildren. She is not a Ambulance person.   ADVANCED DIRECTIVES: Not in place   HEALTH MAINTENANCE: History  Substance Use Topics  . Smoking status: Never Smoker   . Smokeless tobacco: Never Used  . Alcohol Use: No     Colonoscopy:Remote  PAP:2014  Bone density:Never  Lipid panel:  Allergies  Allergen Reactions  . Hydrocodone Hives and Itching    Current Outpatient Prescriptions  Medication Sig Dispense Refill  . acetaminophen (TYLENOL) 500 MG tablet Take 1,000 mg by mouth every 6 (six) hours as needed for mild pain.    Melissa Garza Kitchen ALPRAZolam (XANAX) 1 MG tablet Take 1 mg by mouth 4 (four) times daily as needed for anxiety.    . clobetasol (TEMOVATE) 0.05 % external solution Apply 1 application topically as needed.     Melissa Garza Kitchen FLUoxetine (PROZAC) 40 MG capsule Take 40 mg by mouth daily.    Melissa Garza Kitchen gabapentin (NEURONTIN) 300 MG capsule Take 1 capsule (300 mg total) by mouth at bedtime. (Patient not taking: Reported on 05/10/2015) 90 capsule  2  . ibuprofen (ADVIL,MOTRIN) 200 MG tablet Take 200 mg by mouth every 6 (six) hours as needed.    . loratadine (CLARITIN) 10 MG tablet Take 10 mg by mouth daily.    . methylphenidate 54 MG PO CR tablet Take 54 mg by mouth every morning.    Melissa Garza Kitchen PARoxetine (PAXIL) 10 MG tablet Take 10 mg by mouth daily. For Hot Flashes    . PARoxetine (PAXIL) 20 MG tablet Take 20 mg by mouth daily.    . Polyvinyl Alcohol-Povidone (REFRESH OP) Place 2 drops into both eyes daily as needed (ALLERGIES).    Melissa Garza Kitchen travoprost, benzalkonium, (TRAVATAN) 0.004 % ophthalmic solution Place 1 drop into both eyes at bedtime.     . traZODone (DESYREL) 50 MG tablet Take 50 mg by mouth at bedtime as needed for sleep.    Melissa Garza Kitchen zolpidem (AMBIEN) 10 MG tablet Take 10 mg by mouth at bedtime as needed for sleep.     No current facility-administered medications for this visit.    OBJECTIVE:  Middle-aged  white woman in no acute distress Filed Vitals:   06/07/15 0901  BP: 125/75  Pulse: 88  Temp: 98.5 F (36.9 C)  Resp: 18     Body mass index is 31.57 kg/(m^2).    ECOG FS:1 - Symptomatic but completely ambulatory  Sclerae unicteric, EOMs intact Oropharynx clear, dentition in fair repair No cervical or supraclavicular adenopathy Lungs no rales or rhonchi Heart regular rate and rhythm Abd soft, nontender, positive bowel sounds MSK no focal spinal tenderness, no upper extremity lymphedema Neuro: nonfocal, well oriented, appropriate affect Breasts: The right breast is status post lumpectomy and radiation. There is no significant hyperpigmentation. There is no evidence of local recurrence. The cosmetic result is good. The right axilla is benign. The left breast is remarkable only for moderate candidal infection in the inferior mammary fold   LAB RESULTS:  CMP     Component Value Date/Time   NA 143 02/16/2015 0859   NA 143 06/17/2009 1137   K 3.8 02/16/2015 0859   K 4.7 06/17/2009 1137   CL 114* 06/17/2009 1137   CO2 28  02/16/2015 0859   CO2 24 06/17/2009 1137   GLUCOSE 104 02/16/2015 0859   GLUCOSE 97 06/17/2009 1137   GLUCOSE 81 10/22/2006 1253   BUN 9.5 02/16/2015 0859   BUN 16 06/17/2009 1137   CREATININE 0.8 02/16/2015 0859   CREATININE 1.2 06/17/2009 1137   CALCIUM 9.1 02/16/2015 0859   CALCIUM 9.4 06/17/2009 1137   PROT 6.1* 02/16/2015 0859   PROT 7.0 06/17/2009 1137   ALBUMIN 3.3* 02/16/2015 0859   ALBUMIN 3.9 06/17/2009 1137   AST 22 02/16/2015 0859   AST 15 06/17/2009 1137   ALT 25 02/16/2015 0859   ALT 13 06/17/2009 1137   ALKPHOS 86 02/16/2015 0859   ALKPHOS 55 06/17/2009 1137   BILITOT 0.29 02/16/2015 0859   BILITOT 0.7 06/17/2009 1137   GFRNONAA 49.82 06/17/2009 1137    I No results found for: SPEP  Lab Results  Component Value Date   WBC 4.4 02/16/2015   NEUTROABS 2.6 02/16/2015   HGB 12.4 02/16/2015   HCT 37.3 02/16/2015   MCV 99.5 02/16/2015   PLT 192 02/16/2015      Chemistry      Component Value Date/Time   NA 143 02/16/2015 0859   NA 143 06/17/2009 1137   K 3.8 02/16/2015 0859   K 4.7 06/17/2009 1137   CL 114* 06/17/2009 1137   CO2 28 02/16/2015 0859   CO2 24 06/17/2009 1137   BUN 9.5 02/16/2015 0859   BUN 16 06/17/2009 1137   CREATININE 0.8 02/16/2015 0859   CREATININE 1.2 06/17/2009 1137      Component Value Date/Time   CALCIUM 9.1 02/16/2015 0859   CALCIUM 9.4 06/17/2009 1137   ALKPHOS 86 02/16/2015 0859   ALKPHOS 55 06/17/2009 1137   AST 22 02/16/2015 0859   AST 15 06/17/2009 1137   ALT 25 02/16/2015 0859   ALT 13 06/17/2009 1137   BILITOT 0.29 02/16/2015 0859   BILITOT 0.7 06/17/2009 1137      No results found for: LABCA2  No components found for: LABCA125  No results for input(s): INR in the last 168 hours.  Urinalysis No results found for: COLORURINE  STUDIES: No results found.  ASSESSMENT: 59 y.o. [place] woman status post right breast upper inner quadrant biopsy 08/04/2014 for a clinical T2 N0, stage IIA invasive ductal  carcinoma, grade 3, triple negative, with an MIB-1 of 90%.  1. neoadjuvant  chemotherapy with doxorubicin and cyclophosphamide dose dense x4 completed on 10/05/14; followed by paclitaxel weekly x12 started 10/19/2014. Carboplatin added for cycles 6-12, completed 01/04/15  2. Right lumpectomy and sentinel lymph node sampling 02/01/2015 showed a complete pathologic response.  3.adjuvant radiation04/26/2016 through 04/08/2015:    Right breast 5040 cGy in 28 sessions (no boost)    PLAN: Melissa Garza has completed her active treatment for breast cancer. Overall she tolerated treatment well, but she continues to experience fatigue, which is not unusual in this situation.  We went over the fact that post treatment fatigue is not relieved by rest. On the contrary it seems to get worse with inactivity. It improves with exercise. I gave her a copy of the [employer] pamphlet to consider. If that is to scheduled then short walks early morning or late evening Garza be the easiest way for her to start getting back in shape.  Similarly she gained approximately 20 pounds through this process. That also is not unusual. It is very difficult to lose back that weight. If she wishes to do that in addition to regular exercise she will have to cut back on carbohydrates significantly. In general a diet rich in vegetables, mostly fresh, with some meat is probably ideal for her at this point.  I wrote for ketoconazole for her to use once a day on her left inframammary fold yeast infection until it clears. She knows she Garza develop this problem in the future again if she does not wear bras.  I'm going to see her again in about 3 months. If she has completely recovered by then we will start routine long-term follow-up at that point   Chauncey Cruel, [place] [phone] 06/07/2015 9:06 AM

## 2015-06-07 NOTE — Telephone Encounter (Signed)
Appointments made and avs mailed to patient with  orthopedic information to call for appointment

## 2015-06-07 NOTE — Telephone Encounter (Signed)
Appointments made and avs printed for patient °

## 2015-08-05 ENCOUNTER — Telehealth: Payer: Self-pay | Admitting: Oncology

## 2015-08-05 NOTE — Telephone Encounter (Signed)
Returned patient call re rescheduling appointments. Moved 11/21 appointments to 11/28. Per patient needs AM due to work schedule. Patient has new appointment for 11/28.

## 2015-08-08 ENCOUNTER — Ambulatory Visit
Admission: RE | Admit: 2015-08-08 | Discharge: 2015-08-08 | Disposition: A | Discharge status: Home / Self Care | Payer: 59 | Source: Ambulatory Visit | Attending: Oncology | Admitting: Oncology

## 2015-08-08 DIAGNOSIS — C50211 Malignant neoplasm of upper-inner quadrant of right female breast: Secondary | ICD-10-CM

## 2015-09-03 IMAGING — CR DG CHEST 1V PORT
1 series · 1 of 1 positions shown · non-contrast
Comparison: None.

CLINICAL DATA: Right-sided Port-A-Cath placement. Current history
of right breast cancer.

EXAM:
PORTABLE CHEST - 1 VIEW

[AP]
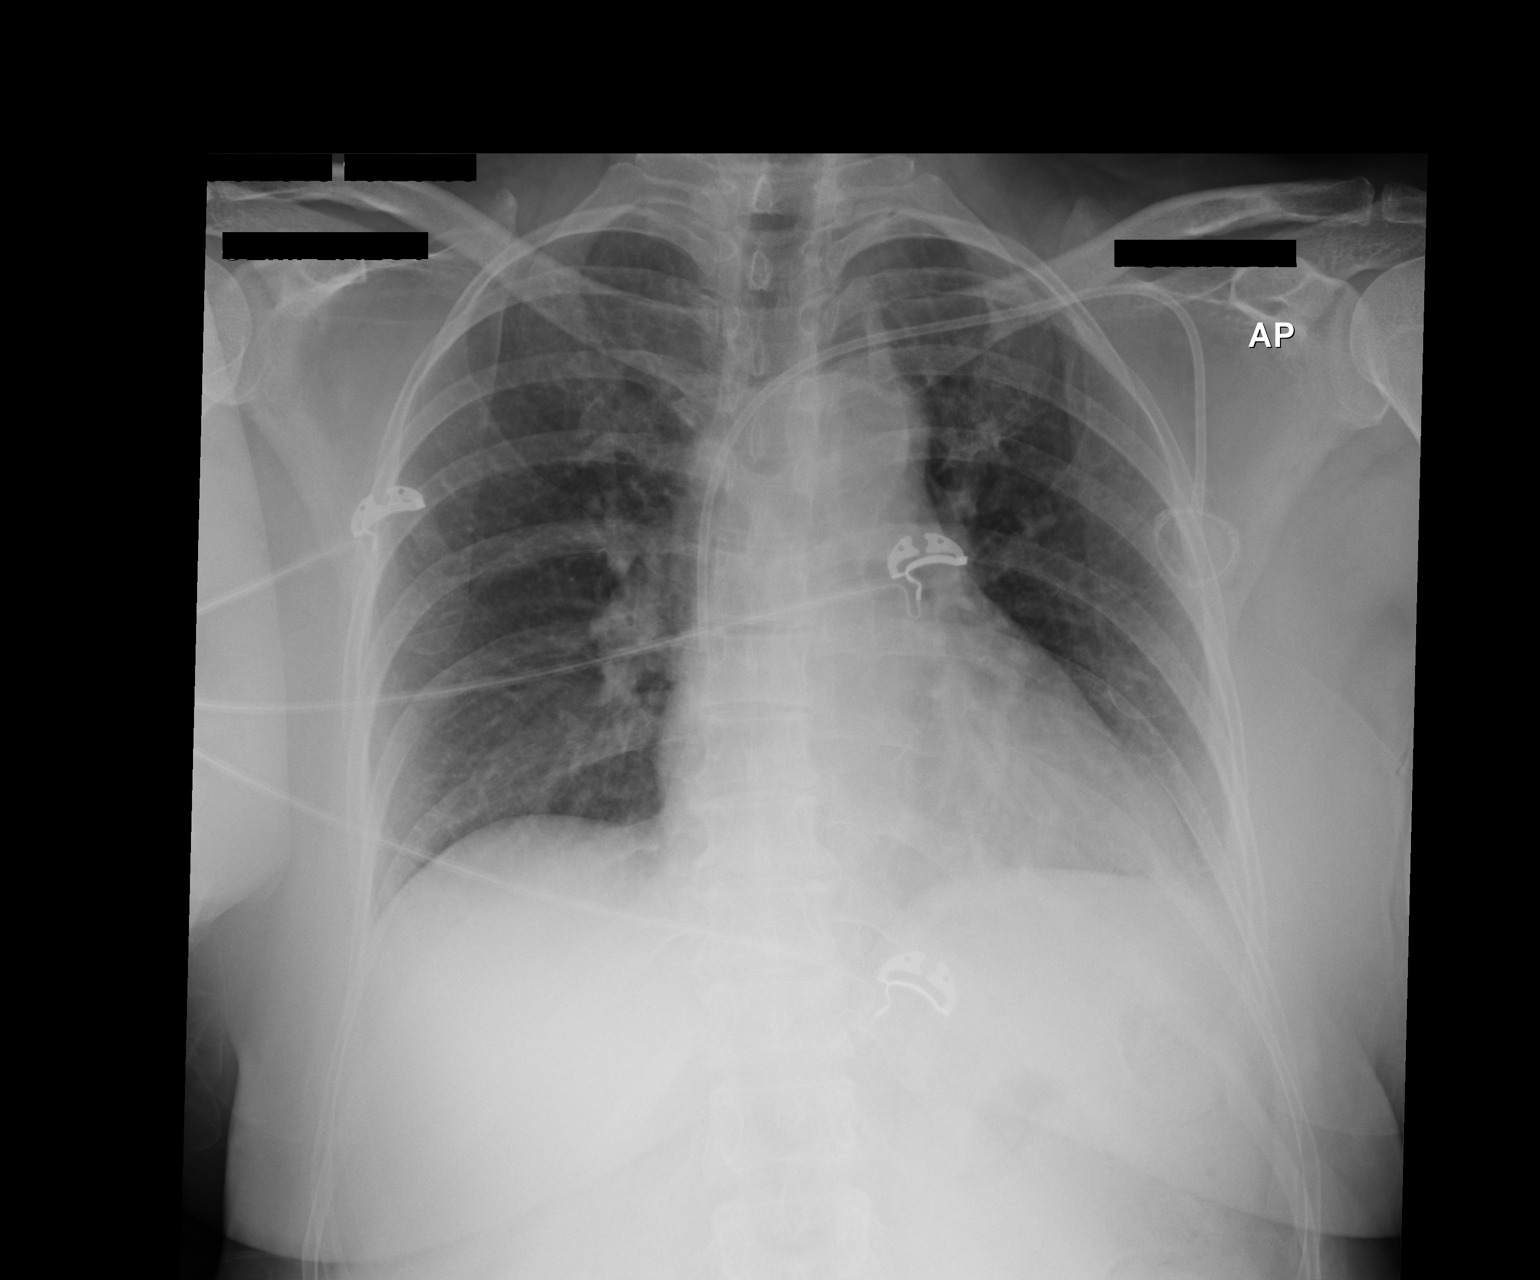

[1 of 1 positions shown; findings below may reference images not displayed]

FINDINGS: The heart size and mediastinal contours are within normal limits.
Both lungs are clear. Left subclavian Port-A-Cath is noted with
distal tip in expected position of the cavoatrial junction. No
pneumothorax or pleural effusion is noted. The visualized skeletal
structures are unremarkable.
IMPRESSION: Left subclavian Port-A-Cath placement is noted without evidence of
pneumothorax.

## 2015-09-06 ENCOUNTER — Other Ambulatory Visit: Payer: Self-pay | Admitting: Oncology

## 2015-09-26 ENCOUNTER — Other Ambulatory Visit: Payer: 59

## 2015-09-26 ENCOUNTER — Ambulatory Visit: Payer: 59 | Admitting: Oncology

## 2015-10-03 ENCOUNTER — Telehealth: Payer: Self-pay | Admitting: Oncology

## 2015-10-03 ENCOUNTER — Other Ambulatory Visit (HOSPITAL_BASED_OUTPATIENT_CLINIC_OR_DEPARTMENT_OTHER): Payer: 59

## 2015-10-03 ENCOUNTER — Ambulatory Visit (HOSPITAL_BASED_OUTPATIENT_CLINIC_OR_DEPARTMENT_OTHER): Payer: 59 | Admitting: Oncology

## 2015-10-03 VITALS — BP 127/72 | HR 85 | Temp 97.6°F | Resp 18 | Ht 61.0 in | Wt 167.1 lb

## 2015-10-03 DIAGNOSIS — Z171 Estrogen receptor negative status [ER-]: Secondary | ICD-10-CM | POA: Diagnosis not present

## 2015-10-03 DIAGNOSIS — R5383 Other fatigue: Secondary | ICD-10-CM

## 2015-10-03 DIAGNOSIS — C50211 Malignant neoplasm of upper-inner quadrant of right female breast: Secondary | ICD-10-CM

## 2015-10-03 LAB — CBC WITH DIFFERENTIAL/PLATELET
BASO%: 0.4 % (ref 0.0–2.0)
Basophils Absolute: 0 10*3/uL (ref 0.0–0.1)
EOS ABS: 0.2 10*3/uL (ref 0.0–0.5)
EOS%: 2.5 % (ref 0.0–7.0)
HCT: 38.1 % (ref 34.8–46.6)
HGB: 12.8 g/dL (ref 11.6–15.9)
LYMPH%: 22.7 % (ref 14.0–49.7)
MCH: 33.1 pg (ref 25.1–34.0)
MCHC: 33.6 g/dL (ref 31.5–36.0)
MCV: 98.4 fL (ref 79.5–101.0)
MONO#: 0.6 10*3/uL (ref 0.1–0.9)
MONO%: 8.1 % (ref 0.0–14.0)
NEUT%: 66.3 % (ref 38.4–76.8)
NEUTROS ABS: 5.1 10*3/uL (ref 1.5–6.5)
Platelets: 198 10*3/uL (ref 145–400)
RBC: 3.87 10*6/uL (ref 3.70–5.45)
RDW: 13.1 % (ref 11.2–14.5)
WBC: 7.7 10*3/uL (ref 3.9–10.3)
lymph#: 1.7 10*3/uL (ref 0.9–3.3)

## 2015-10-03 LAB — COMPREHENSIVE METABOLIC PANEL (CC13)
ALT: 19 U/L (ref 0–55)
AST: 17 U/L (ref 5–34)
Albumin: 3.6 g/dL (ref 3.5–5.0)
Alkaline Phosphatase: 130 U/L (ref 40–150)
Anion Gap: 8 mEq/L (ref 3–11)
BUN: 15.8 mg/dL (ref 7.0–26.0)
CHLORIDE: 102 meq/L (ref 98–109)
CO2: 28 mEq/L (ref 22–29)
Calcium: 9.7 mg/dL (ref 8.4–10.4)
Creatinine: 1.1 mg/dL (ref 0.6–1.1)
EGFR: 55 mL/min/{1.73_m2} — ABNORMAL LOW (ref >90)
GLUCOSE: 87 mg/dL (ref 70–140)
Potassium: 4 mEq/L (ref 3.5–5.1)
SODIUM: 138 meq/L (ref 136–145)
Total Bilirubin: <0.3 mg/dL (ref 0.20–1.20)
Total Protein: 6.9 g/dL (ref 6.4–8.3)

## 2015-10-03 NOTE — Progress Notes (Signed)
Melissa Garza  Telephone:(336) [phone] Fax:(336) [phone]     ID: [name] DOB: 12-15-1965  MR#: [id number]  [id number]  Patient Care Team: No Pcp Per Patient as PCP - General (General Practice) Harle Battiest, MD as Consulting Physician (Obstetrics and Gynecology) Excell Seltzer, MD as Consulting Physician (General Surgery) Chauncey Cruel, MD as Consulting Physician (Oncology) Arloa Koh, MD as Consulting Physician (Radiation Oncology) OTHER MD: Chucky May MD  CHIEF COMPLAINT: Triple negative breast cancer  CURRENT TREATMENT: Observation  BREAST CANCER HISTORY: From the original intake note:  [name] herself palpated a mass in her right breast approximately mid-September. She brought this to her gynecologist attention and he set her up for bilateral diagnostic mammography at the breast center 08/04/2014. The breast density was category C. in the patient has bilateral saline implants in place. In the palpable area of concern in the right breast there was an irregular mass measuring up to 2.5 cm. By palpation this was firm at the 12:30 o'clock position. Ultrasound of the right breast confirmed an irregular hypoechoic mass in this area measuring 2.5 cm. There was no right axillary lymphadenopathy noted.  Biopsy of the mass in question 08/04/2014 showed ([id number]) invasive ductal carcinoma, grade 3, triple negative, with an MIB-1 of 90%.  Bilateral breast MRI 08/09/2014 showed in the upper inner quadrant of the right breast an irregular enhancing mass measuring 2.8 cm. There was a 4 mm satellite nodule anterior to this and separated from that by 0.6 cm. The left breast, the remaining of the right breast and the lymph node areas were negative.  The patient's subsequent history is as detailed below  INTERVAL HISTORY: [name] returns today for follow up of her triple negative breast cancer. As far as that is concerned the interval history is generally  unremarkable.  ROS:  [employer]works third shift. She does not want to change to first shift because there is a big paid differential. However at home she sleeps all the time. She will go to bed at 8 in the morning and get up and about 6 PM, eat supper, and then get ready for work there is she also sleeps during the weekends. She takes NX up to 4 times a day, Prozac daily, Neurontin at bedtime, Concerta every morning, Paxil daily and also Desirel and Ambien as needed for sleep. She is concerned that she is gaining weight. She has a rash under her left breast which has been treated with various screens and oral antifungal's without resolution. Occasionally she has headaches. She feels forgetful. A detailed review of systems today was otherwise stable.  PAST MEDICAL HISTORY: Past Medical History  Diagnosis Date  . Glaucoma   . Anxiety   . Hot flashes   . Depression   . PONV (postoperative nausea and vomiting)   . Headache     chronic  . Shortness of breath     with exertion  . GERD (gastroesophageal reflux disease)     PMH  . Cancer     cancer of right breast  . Hepatitis     Hx: of Hepatitis not sure which one  . S/P radiation therapy 03/01/2015 through 06/03/201604/26/2016 through 04/08/2015      Right breast 5040 cGy in 28 sessions (no boost)    PAST SURGICAL HISTORY: Past Surgical History  Procedure Laterality Date  . Tonsilectomy/adenoidectomy with myringotomy    . Breast enhancement surgery  2008  . Eye surgery      Lasik  .  Colonoscopy    . Breast surgery      Biopsy  . Dilation and curettage of uterus    . Tubal ligation    . Portacath placement N/A 08/13/2014    Procedure: INSERTION PORT-A-CATH;  Surgeon: Excell Seltzer, MD;  Location: [facility];  Service: General;  Laterality: N/A;  . Port-a-cath removal N/A 02/01/2015    Procedure: REMOVAL PORT-A-CATH;  Surgeon: Excell Seltzer, MD;  Location: [facility];  Service: General;  Laterality: N/A;    FAMILY HISTORY Family History  Problem Relation Age of Onset  . Psoriasis Mother   . Hypertension Father   . Asthma Other   . Thyroid disease Other    The patient's parents are currently alive, in their late 49s. The patient had no brothers, 2 sisters. There is no history of breast or ovarian cancer in the family to her knowledge.   GYNECOLOGIC HISTORY:  No LMP recorded. Patient is postmenopausal. Menarche age 59, first live birth age 59. She is GX P2. She stopped having periods in 2005 and started hormone replacement at that time. 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 Her husband, [name], is disabled secondary to multiple cardiac problems. Son [name] lives in [place] and works in apartment maintenance. Son [name] is his wife) works as an Clinical biochemist. The patient has 6 grandchildren. She is not a Ambulance person.   ADVANCED DIRECTIVES: Not in place   HEALTH MAINTENANCE: Social History  Substance Use Topics  . Smoking status: Never Smoker   . Smokeless tobacco: Never Used  . Alcohol Use: No     Colonoscopy:Remote  PAP:2014  Bone density:Never  Lipid panel:  Allergies  Allergen Reactions  . Hydrocodone Hives and Itching    Current Outpatient Prescriptions  Medication Sig Dispense Refill  . acetaminophen (TYLENOL) 500 MG tablet Take 1,000 mg by mouth every 6 (six) hours as needed for mild pain.    Marland Kitchen ALPRAZolam (XANAX) 1 MG tablet Take 1 mg by mouth 4 (four) times daily as needed for anxiety.    . clobetasol (TEMOVATE) 0.05 % external solution Apply 1 application topically as needed.     Marland Kitchen FLUoxetine (PROZAC) 40 MG capsule Take 40 mg by mouth daily.    Marland Kitchen gabapentin (NEURONTIN) 300 MG capsule Take 1 capsule (300 mg total) by mouth at  bedtime. (Patient not taking: Reported on 05/10/2015) 90 capsule 2  . ketoconazole (NIZORAL) 2 % cream APPLY TOPICALLY DAILY 15 g 0  . methylphenidate 54 MG PO CR tablet Take 54 mg by mouth every morning.    . naproxen (NAPROSYN) 250 MG tablet Take by mouth 2 (two) times daily with a meal.    . PARoxetine (PAXIL) 20 MG tablet Take 20 mg by mouth daily.    . Polyvinyl Alcohol-Povidone (REFRESH OP) Place 2 drops into both eyes daily as needed (ALLERGIES).    Marland Kitchen travoprost, benzalkonium, (TRAVATAN) 0.004 % ophthalmic solution Place 1 drop into both eyes at bedtime.     . traZODone (DESYREL) 50 MG tablet Take 50 mg by mouth at bedtime as needed for sleep.    Marland Kitchen zolpidem (AMBIEN) 10 MG tablet Take 10 mg by mouth at bedtime as needed for sleep.     No current facility-administered medications for this visit.    OBJECTIVE:  Middle-aged white woman in no acute  distress Filed Vitals:   10/03/15 0941  BP: 127/72  Pulse: 85  Temp: 97.6 F (36.4 C)  Resp: 18     Body mass index is 31.59 kg/(m^2).    ECOG FS:0 - Asymptomatic  Sclerae unicteric, pupils round and equal Oropharynx clear and moist-- no thrush or other lesions No cervical or supraclavicular adenopathy Lungs no rales or rhonchi Heart regular rate and rhythm Abd soft, nontender, positive bowel sounds MSK no focal spinal tenderness, no upper extremity lymphedema Neuro: nonfocal, well oriented, appropriate affect Breasts: the right breast is status post lumpectomy and radiation. There is no evidence of local recurrence. The right axilla is benign. The left breast is unremarkable.  LAB RESULTS:  CMP     Component Value Date/Time   NA 143 02/16/2015 0859   NA 143 06/17/2009 1137   K 3.8 02/16/2015 0859   K 4.7 06/17/2009 1137   CL 114* 06/17/2009 1137   CO2 28 02/16/2015 0859   CO2 24 06/17/2009 1137   GLUCOSE 104 02/16/2015 0859   GLUCOSE 97 06/17/2009 1137   GLUCOSE 81 10/22/2006 1253   BUN 9.5 02/16/2015 0859   BUN 16  06/17/2009 1137   CREATININE 0.8 02/16/2015 0859   CREATININE 1.2 06/17/2009 1137   CALCIUM 9.1 02/16/2015 0859   CALCIUM 9.4 06/17/2009 1137   PROT 6.1* 02/16/2015 0859   PROT 7.0 06/17/2009 1137   ALBUMIN 3.3* 02/16/2015 0859   ALBUMIN 3.9 06/17/2009 1137   AST 22 02/16/2015 0859   AST 15 06/17/2009 1137   ALT 25 02/16/2015 0859   ALT 13 06/17/2009 1137   ALKPHOS 86 02/16/2015 0859   ALKPHOS 55 06/17/2009 1137   BILITOT 0.29 02/16/2015 0859   BILITOT 0.7 06/17/2009 1137   GFRNONAA 49.82 06/17/2009 1137    I No results found for: SPEP  Lab Results  Component Value Date   WBC 7.7 10/03/2015   NEUTROABS 5.1 10/03/2015   HGB 12.8 10/03/2015   HCT 38.1 10/03/2015   MCV 98.4 10/03/2015   PLT 198 10/03/2015      Chemistry      Component Value Date/Time   NA 143 02/16/2015 0859   NA 143 06/17/2009 1137   K 3.8 02/16/2015 0859   K 4.7 06/17/2009 1137   CL 114* 06/17/2009 1137   CO2 28 02/16/2015 0859   CO2 24 06/17/2009 1137   BUN 9.5 02/16/2015 0859   BUN 16 06/17/2009 1137   CREATININE 0.8 02/16/2015 0859   CREATININE 1.2 06/17/2009 1137      Component Value Date/Time   CALCIUM 9.1 02/16/2015 0859   CALCIUM 9.4 06/17/2009 1137   ALKPHOS 86 02/16/2015 0859   ALKPHOS 55 06/17/2009 1137   AST 22 02/16/2015 0859   AST 15 06/17/2009 1137   ALT 25 02/16/2015 0859   ALT 13 06/17/2009 1137   BILITOT 0.29 02/16/2015 0859   BILITOT 0.7 06/17/2009 1137      No results found for: LABCA2  No components found for: LABCA125  No results for input(s): INR in the last 168 hours.  Urinalysis No results found for: COLORURINE  STUDIES: CLINICAL DATA: Patient due for annual examination. History of right breast cancer, diagnosed in 2016. Status post lumpectomy March 2016. Bilateral breast implants. The patient is asymptomatic.  EXAM: DIGITAL DIAGNOSTIC BILATERAL MAMMOGRAM WITH IMPLANTS, 3D TOMOSYNTHESIS WITH CAD  The patient has retropectoral implants. Standard  and implant displaced views were performed.  COMPARISON: Previous exam(s).  ACR Breast Density Category b: There are  scattered areas of fibroglandular density.  FINDINGS: There are lumpectomy changes in the upper inner right breast. No mass, nonsurgical distortion, or suspicious microcalcification identified in either breast to suggest malignancy.  Mammographic images were processed with CAD.  IMPRESSION: No evidence of malignancy in either breast. Lumpectomy changes on the right and bilateral breast implants.  RECOMMENDATION: Diagnostic mammogram is suggested in 1 year. (Code:DM-B-01Y)  I have discussed the findings and recommendations with the patient. Results were also provided in writing at the conclusion of the visit. If applicable, a reminder letter will be sent to the patient regarding the next appointment.  BI-RADS CATEGORY 2: Benign.   Electronically Signed  By: Curlene Dolphin M.D.  On: 08/08/2015 10:29  ASSESSMENT: 59 y.o. [place] woman status post right breast upper inner quadrant biopsy 08/04/2014 for a clinical T2 N0, stage IIA invasive ductal carcinoma, grade 3, triple negative, with an MIB-1 of 90%.  1. neoadjuvant chemotherapy with doxorubicin and cyclophosphamide dose dense x4 completed on 10/05/14; followed by paclitaxel weekly x12 started 10/19/2014. Carboplatin added for cycles 6-12, completed 01/04/15  2. Right lumpectomy and sentinel lymph node sampling 02/01/2015 showed a complete pathologic response.  3.adjuvant radiation04/26/2016 through 04/08/2015:    Right breast 5040 cGy in 28 sessions (no boost)    PLAN: Supriya is doing fine as far as her breast cancer is concerned. The plan there is to see her in 6 and 12 months and then start seeing her on a yearly basis, alternating with her surgeon.  I am not sure that I am going to be able to help her as far as the fatigue  is concerned. 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 However she is also on Xanax, Prozac, Neurontin, Concerta, Paxil, and Desiree L, as well as Ambien. This is quite a cocktail of psychotropics. I suggested she make sure to review these with Dr. Toy Care her psychiatrist at the next visit. Possibly some of these medications could be weaned off.  It would also help [place] if she started an exercise program.  She has had good treatment for what appears to be a fungal infection in the left inframammary fold. At this point, since there has not been resolution, perhaps a dermatology referral would be a good idea. She is agreeable  She will see [place] again in 6 months. She knows to call for any problems that may develop before her next visit here.    Chauncey Cruel, [place] [phone] 10/03/2015 10:21 AM

## 2015-10-03 NOTE — Telephone Encounter (Signed)
Gave and printed appt sched and avs for pt for may./.Marland KitchenMarland Kitchenthe patient sched to see Dr. Denna Haggard dermatologist on 1.4 @ 10:30.Marland Kitchen..Marland Kitchenemailed HIM to fax all records

## 2015-11-29 ENCOUNTER — Other Ambulatory Visit: Payer: Self-pay | Admitting: Dermatology

## 2015-12-27 ENCOUNTER — Other Ambulatory Visit: Payer: Self-pay | Admitting: Nurse Practitioner

## 2015-12-27 DIAGNOSIS — C50211 Malignant neoplasm of upper-inner quadrant of right female breast: Secondary | ICD-10-CM

## 2016-01-28 IMAGING — MR MR BREAST BILATERAL W WO CONTRAST
6 of 14 series · 19 of 48 positions shown · IV contrast (Yes)
Comparison: MRI 10/08/2014, 08/09/2014.

CLINICAL DATA: Biopsy proven grade 3 invasive ductal carcinoma in
the upper inner quadrant of the right breast originally diagnosed in
July 2014. Patient recently completed neoadjuvant
chemotherapy. MRI requested to evaluate treatment response. Patient
has bilateral subpectoral saline implants.

LABS:  Not applicable.
EXAM:
BILATERAL BREAST MRI WITH AND WITHOUT CONTRAST
TECHNIQUE: Multiplanar, multisequence MR images of both breasts were obtained
prior to and following the intravenous administration of 15 ml of
Multihance.

[Series 1: acess#(phone_number) · axial · 7.0mm · 1.56mm/px · 1 of 25 slices shown]
[im 1/25]
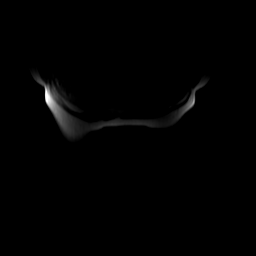

[Series 3: T2 · axial · 3.0mm · 0.66mm/px · 1 of 76 slices shown]
[im 1/76]
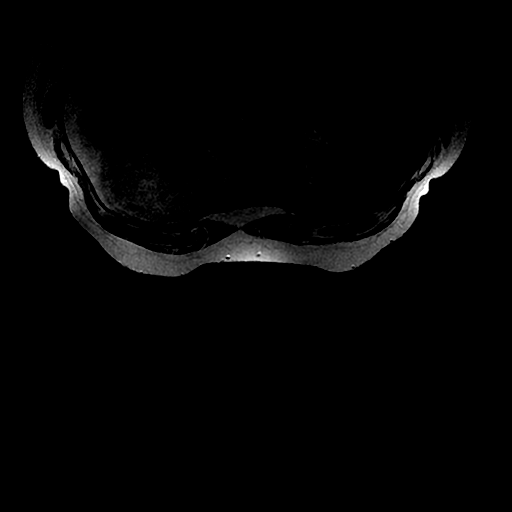

[Series 5: ax ir · axial · 3.0mm · 0.66mm/px · 1 of 76 slices shown]
[im 1/76]
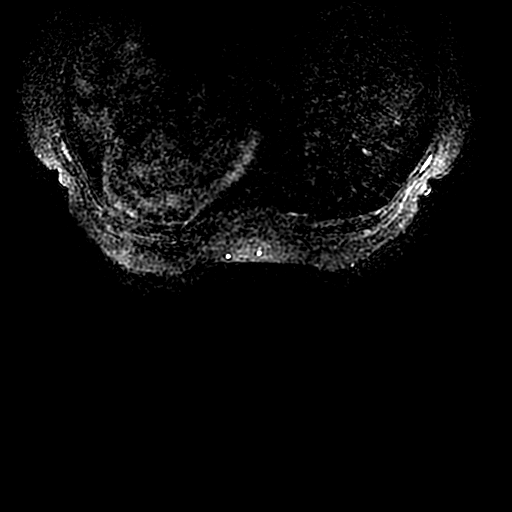

[Series 600: 15ml vibrant mph · axial · 2.0mm · 0.70mm/px · z∈[-107,+120]mm · 5 of 228 slices shown]
[im 1/228]
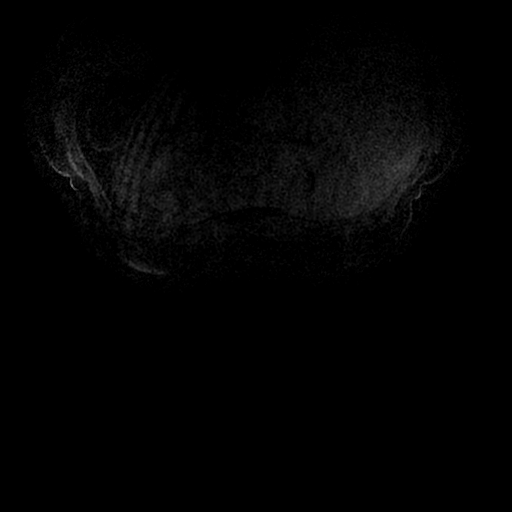
[im 57/228]
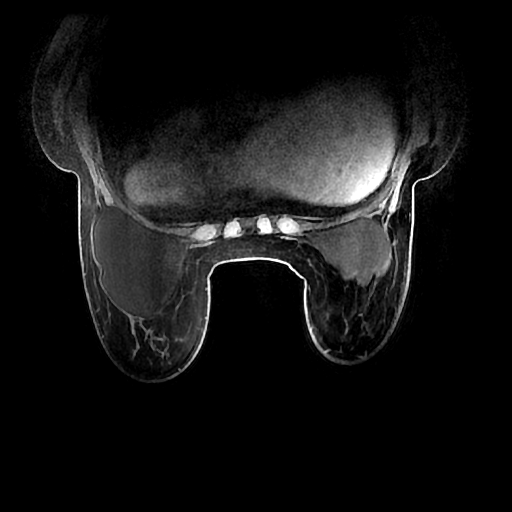
[im 114/228]
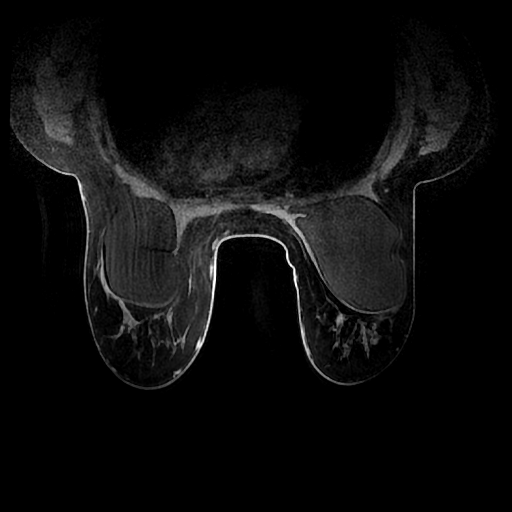
[im 171/228]
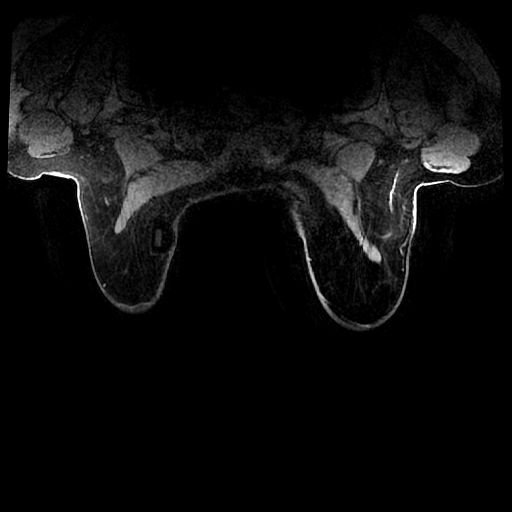
[im 228/228]
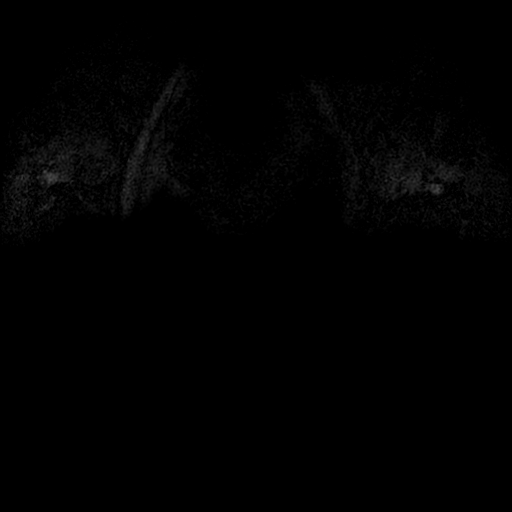

[Series 601: ph1/15ml vibrant mph · axial · 2.0mm · 0.70mm/px · z∈[-107,+120]mm · 5 of 228 slices shown]
[im 1/228]
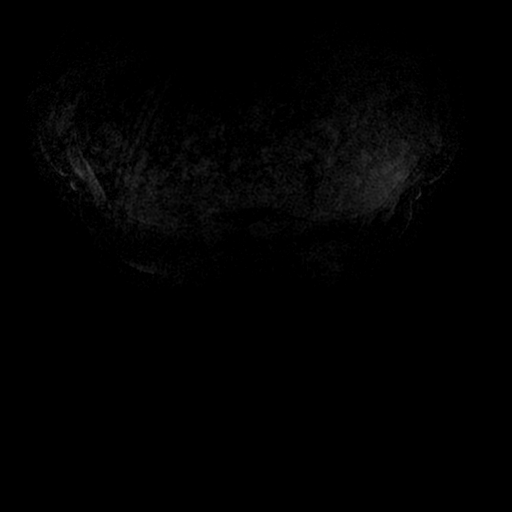
[im 57/228]
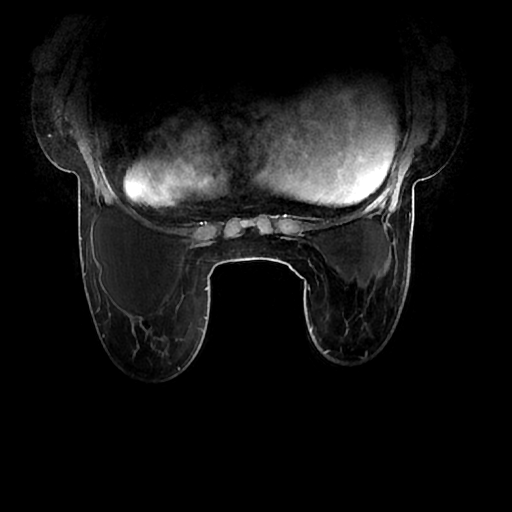
[im 114/228]
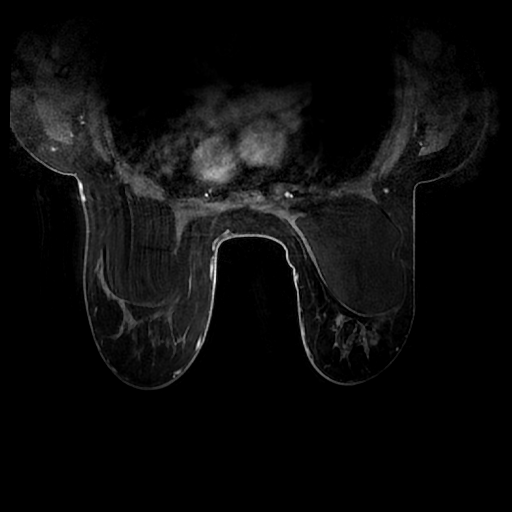
[im 171/228]
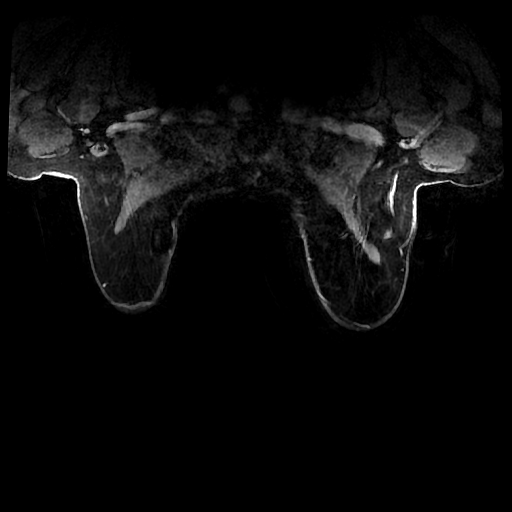
[im 228/228]
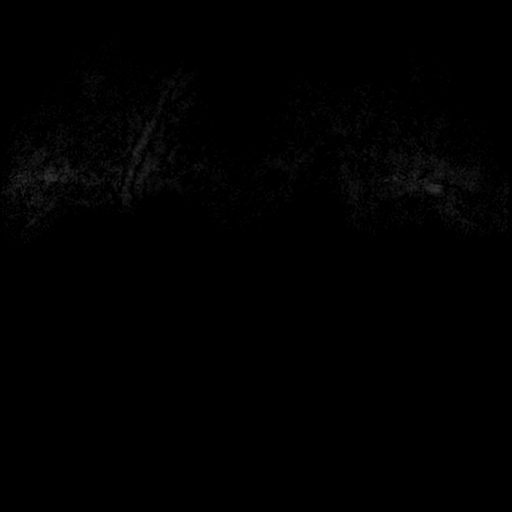

[Series 602: ph2/15ml vibrant mph · axial · 2.0mm · 0.70mm/px · z∈[-107,+120]mm · 6 of 228 slices shown]
[im 1/228]
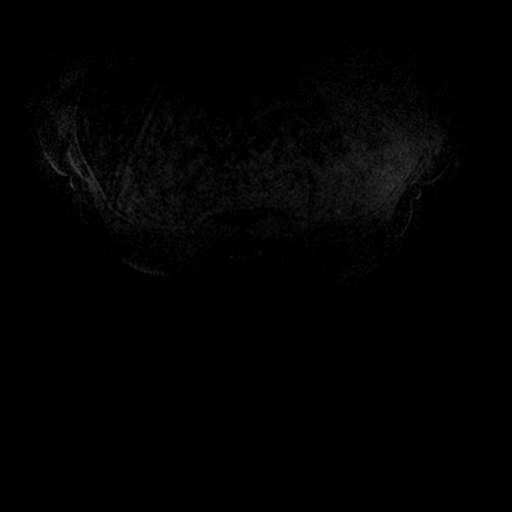
[im 46/228]
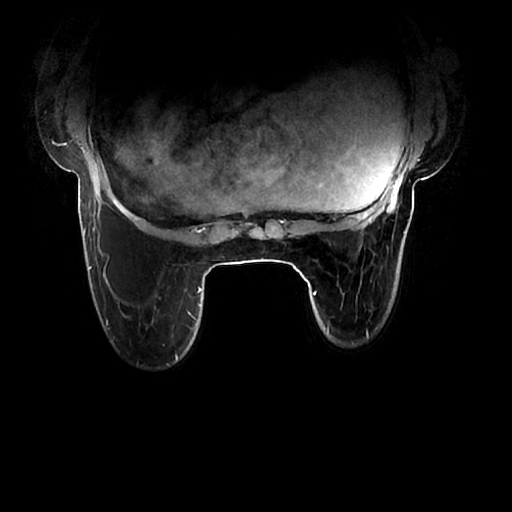
[im 91/228]
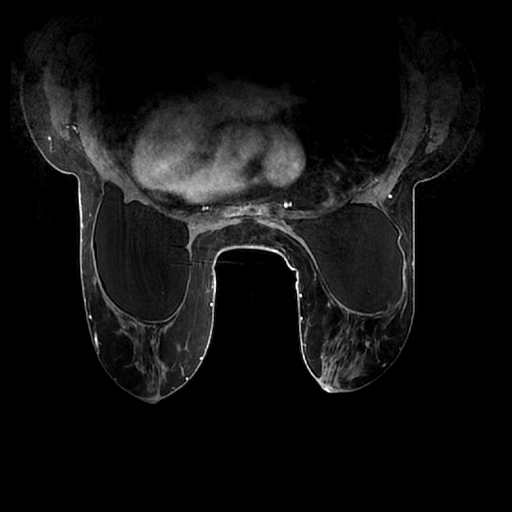
[im 137/228]
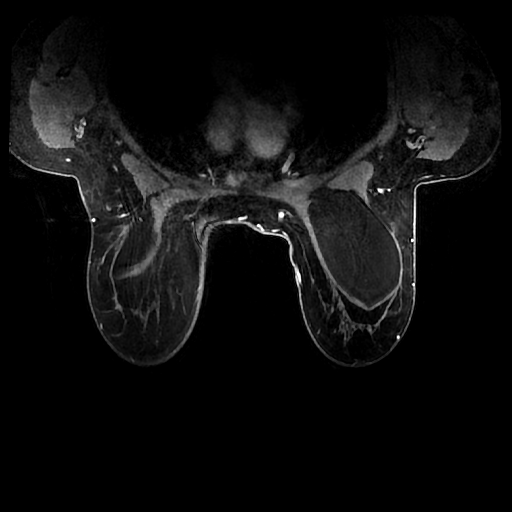
[im 182/228]
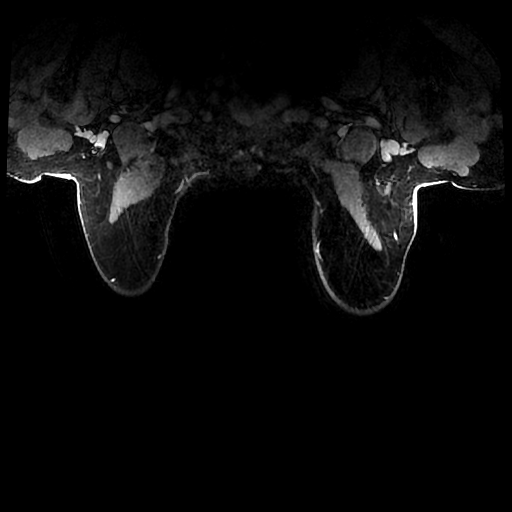
[im 228/228]
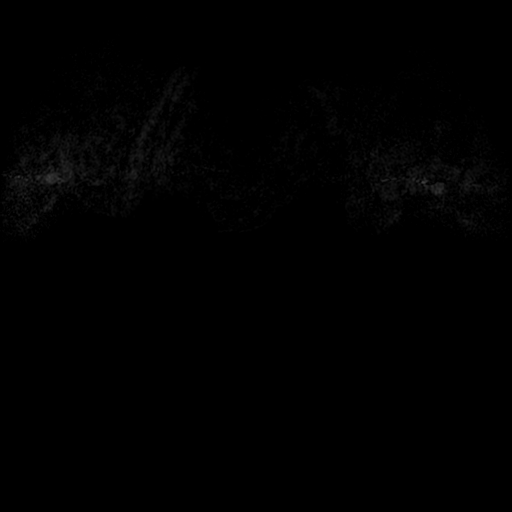

[19 of 48 positions shown; findings below may reference images not displayed]

THREE-DIMENSIONAL MR IMAGE RENDERING ON INDEPENDENT WORKSTATION:

Three-dimensional MR images were rendered by post-processing of the
original MR data on an independent workstation. The
three-dimensional MR images were interpreted, and findings are
reported in the following complete MRI report for this study. Three
dimensional images were evaluated at the independent [employer]
workstation.
Right breast ultrasound
08/04/2014. Mammography 08/04/2014 dating back to 01/03/2007.
FINDINGS: The technologist states that the patient had several episodes of
coughing during the examination, accounting for motion. However, the
study is diagnostic.

Breast composition: b.  Scattered fibroglandular tissue.

Background parenchymal enhancement: Mild.

Right breast: No residual mass or abnormal enhancement in the upper
inner quadrant at the site of the biopsy proven malignancy. The
previously identified satellite nodule anterolateral to the dominant
mass is also no longer visible. No new or suspicious findings
elsewhere in the right breast.

Left breast: No mass or abnormal enhancement. Intramammary lymph
node in the outer subareolar region demonstrating a normal fatty
hilus.

Lymph nodes: No pathologic lymphadenopathy.

Ancillary findings:  None.
IMPRESSION: 1. Interval complete response to neoadjuvant chemotherapy as the
mass in the upper inner quadrant of the right breast, biopsy-proven
invasive ductal carcinoma and DCIS, has resolved. The adjacent
satellite nodule has also resolved.
2. No evidence of malignancy elsewhere in either breast.
3. No pathologic lymphadenopathy.

RECOMMENDATION:
Treatment plan.

BI-RADS CATEGORY  6: Known biopsy-proven malignancy.

## 2016-03-23 ENCOUNTER — Other Ambulatory Visit: Payer: Self-pay | Admitting: Nurse Practitioner

## 2016-03-26 ENCOUNTER — Ambulatory Visit (HOSPITAL_BASED_OUTPATIENT_CLINIC_OR_DEPARTMENT_OTHER): Payer: 59 | Admitting: Nurse Practitioner

## 2016-03-26 ENCOUNTER — Telehealth: Payer: Self-pay | Admitting: Nurse Practitioner

## 2016-03-26 ENCOUNTER — Encounter: Payer: Self-pay | Admitting: Nurse Practitioner

## 2016-03-26 ENCOUNTER — Other Ambulatory Visit (HOSPITAL_BASED_OUTPATIENT_CLINIC_OR_DEPARTMENT_OTHER): Payer: 59

## 2016-03-26 VITALS — BP 131/90 | HR 79 | Temp 98.7°F | Resp 18 | Wt 163.0 lb

## 2016-03-26 DIAGNOSIS — R5382 Chronic fatigue, unspecified: Secondary | ICD-10-CM

## 2016-03-26 DIAGNOSIS — Z171 Estrogen receptor negative status [ER-]: Secondary | ICD-10-CM | POA: Diagnosis not present

## 2016-03-26 DIAGNOSIS — R5383 Other fatigue: Secondary | ICD-10-CM | POA: Diagnosis not present

## 2016-03-26 DIAGNOSIS — C50211 Malignant neoplasm of upper-inner quadrant of right female breast: Secondary | ICD-10-CM | POA: Diagnosis not present

## 2016-03-26 LAB — CBC WITH DIFFERENTIAL/PLATELET
BASO%: 0.6 % (ref 0.0–2.0)
Basophils Absolute: 0 10*3/uL (ref 0.0–0.1)
EOS%: 2.2 % (ref 0.0–7.0)
Eosinophils Absolute: 0.1 10*3/uL (ref 0.0–0.5)
HEMATOCRIT: 35.9 % (ref 34.8–46.6)
HGB: 12.2 g/dL (ref 11.6–15.9)
LYMPH%: 29.8 % (ref 14.0–49.7)
MCH: 33.8 pg (ref 25.1–34.0)
MCHC: 34 g/dL (ref 31.5–36.0)
MCV: 99.3 fL (ref 79.5–101.0)
MONO#: 0.4 10*3/uL (ref 0.1–0.9)
MONO%: 6.1 % (ref 0.0–14.0)
NEUT%: 61.3 % (ref 38.4–76.8)
NEUTROS ABS: 4.2 10*3/uL (ref 1.5–6.5)
Platelets: 220 10*3/uL (ref 145–400)
RBC: 3.62 10*6/uL — ABNORMAL LOW (ref 3.70–5.45)
RDW: 13.1 % (ref 11.2–14.5)
WBC: 6.9 10*3/uL (ref 3.9–10.3)
lymph#: 2.1 10*3/uL (ref 0.9–3.3)

## 2016-03-26 LAB — COMPREHENSIVE METABOLIC PANEL
ALT: 16 U/L (ref 0–55)
AST: 16 U/L (ref 5–34)
Albumin: 3.6 g/dL (ref 3.5–5.0)
Alkaline Phosphatase: 139 U/L (ref 40–150)
Anion Gap: 8 mEq/L (ref 3–11)
BUN: 19.3 mg/dL (ref 7.0–26.0)
CHLORIDE: 105 meq/L (ref 98–109)
CO2: 26 meq/L (ref 22–29)
CREATININE: 1 mg/dL (ref 0.6–1.1)
Calcium: 9.4 mg/dL (ref 8.4–10.4)
EGFR: 61 mL/min/{1.73_m2} — ABNORMAL LOW (ref >90)
Glucose: 87 mg/dl (ref 70–140)
Potassium: 4 mEq/L (ref 3.5–5.1)
Sodium: 138 mEq/L (ref 136–145)
TOTAL PROTEIN: 7 g/dL (ref 6.4–8.3)

## 2016-03-26 NOTE — Telephone Encounter (Signed)
appt made and avs printed °

## 2016-03-26 NOTE — Progress Notes (Signed)
Melissa Garza  Telephone:(336) [phone] Fax:(336) [phone]     ID: Melissa Garza DOB: Dec 28, 1955  MR#: [id number]  [id number]  Patient Care Team: No Pcp Per Patient as PCP - General (General Practice) Harle Battiest, MD as Consulting Physician (Obstetrics and Gynecology) Excell Seltzer, MD as Consulting Physician (General Surgery) Chauncey Cruel, MD as Consulting Physician (Oncology) Arloa Koh, MD as Consulting Physician (Radiation Oncology) OTHER MD: Chucky May MD  CHIEF COMPLAINT: Triple negative breast cancer  CURRENT TREATMENT: Observation  BREAST CANCER HISTORY: From the original intake note:  Melissa Garza herself palpated a mass in her right breast approximately mid-September. She brought this to her gynecologist attention and he set her up for bilateral diagnostic mammography at the breast center 08/04/2014. The breast density was category C. in the patient has bilateral saline implants in place. In the palpable area of concern in the right breast there was an irregular mass measuring up to 2.5 cm. By palpation this was firm at the 12:30 o'clock position. Ultrasound of the right breast confirmed an irregular hypoechoic mass in this area measuring 2.5 cm. There was no right axillary lymphadenopathy noted.  Biopsy of the mass in question 08/04/2014 showed ([id number]) invasive ductal carcinoma, grade 3, triple negative, with an MIB-1 of 90%.  Bilateral breast MRI 08/09/2014 showed in the upper inner quadrant of the right breast an irregular enhancing mass measuring 2.8 cm. There was a 4 mm satellite nodule anterior to this and separated from that by 0.6 cm. The left breast, the remaining of the right breast and the lymph node areas were negative.  The patient's subsequent history is as detailed below  INTERVAL HISTORY: Melissa Garza returns today for follow up of her triple negative breast cancer. The interval history is generally unremarkable.  REVIEW OF  SYSTEMS:  Melissa Garza's main complaint today is continued fatigued. She naps frequently, often times right after she gets home from work. Sometimes she has trouble sleeping at night. She works 3rd shift generally. She visited a dermatologist who diagnosed the rash under her left breast as psoriasis. She is applying a steroid cream twice daily to the area. It is occasionally pruritic. She denies fevers, chills, nausea, vomiting, or changes in bowel or bladder habits. She has no shortness of breath, chest pain, cough, or palpitations. She has occasional headaches, but denies dizziness or vision changes. She is no pain. Her mood is stable on prozac, concerta, xanax, and paxil. She has trazodone and ambien for sleep. A detailed review of systems is otherwise stable.  PAST MEDICAL HISTORY: Past Medical History  Diagnosis Date  . Glaucoma   . Anxiety   . Hot flashes   . Depression   . PONV (postoperative nausea and vomiting)   . Headache     chronic  . Shortness of breath     with exertion  . GERD (gastroesophageal reflux disease)     PMH  . Cancer     cancer of right breast  . Hepatitis     Hx: of Hepatitis not sure which one  . S/P radiation therapy 03/01/2015 through 06/03/201604/26/2016 through 04/08/2015      Right breast 5040 cGy in 28 sessions (no boost)    PAST SURGICAL HISTORY: Past Surgical History  Procedure Laterality Date  . Tonsilectomy/adenoidectomy with myringotomy    . Breast enhancement surgery  2008  . Eye surgery      Lasik  . Colonoscopy    . Breast surgery      Biopsy  .  Dilation and curettage of uterus    . Tubal ligation    . Portacath placement N/A 08/13/2014    Procedure: INSERTION PORT-A-CATH;  Surgeon: Excell Seltzer, MD;  Location: [facility];  Service: General;  Laterality: N/A;  . Port-a-cath removal N/A 02/01/2015    Procedure: REMOVAL PORT-A-CATH;  Surgeon:  Excell Seltzer, MD;  Location: [facility];  Service: General;  Laterality: N/A;    FAMILY HISTORY Family History  Problem Relation Age of Onset  . Psoriasis Mother   . Hypertension Father   . Asthma Other   . Thyroid disease Other    The patient's parents are currently alive, in their late 64s. The patient had no brothers, 2 sisters. There is no history of breast or ovarian cancer in the family to her knowledge.   GYNECOLOGIC HISTORY:  No LMP recorded. Patient is postmenopausal. Menarche age 63, first live birth age 34. She is GX P2. She stopped having periods in 2005 and started hormone replacement at that time. 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 Her husband, [name], is disabled secondary to multiple cardiac problems. Son [name] lives in [place] and works in apartment maintenance. Son [name] is his wife) works as an Clinical biochemist. The patient has 6 grandchildren. She is not a Ambulance person.   ADVANCED DIRECTIVES: Not in place   HEALTH MAINTENANCE: Social History  Substance Use Topics  . Smoking status: Never Smoker   . Smokeless tobacco: Never Used  . Alcohol Use: No     Colonoscopy:Remote  PAP:2014  Bone density:Never  Lipid panel:  Allergies  Allergen Reactions  . Hydrocodone Hives and Itching    Current Outpatient Prescriptions  Medication Sig Dispense Refill  . acetaminophen (TYLENOL) 500 MG tablet Take 1,000 mg by mouth every 6 (six) hours as needed for mild pain.    Marland Kitchen ALPRAZolam (XANAX) 1 MG tablet Take 1 mg by mouth 4 (four) times daily as needed for anxiety.    . Cyanocobalamin (VITAMIN B 12 PO) Take by mouth daily.    Marland Kitchen FLUoxetine (PROZAC) 40 MG capsule Take 40 mg by mouth daily.    Marland Kitchen gabapentin (NEURONTIN) 300 MG capsule TAKE ONE CAPSULE BY MOUTH AT BEDTIME 90 capsule 0  . hydrocortisone  2.5 % lotion Apply topically 2 (two) times daily.    Lowanda Foster ([place] OP) Apply to eye.    . methylphenidate 54 MG PO CR tablet Take 54 mg by mouth every morning.    . naproxen (NAPROSYN) 250 MG tablet Take by mouth 2 (two) times daily with a meal.    . PARoxetine (PAXIL) 20 MG tablet Take 20 mg by mouth daily.    . Polyvinyl Alcohol-Povidone (REFRESH OP) Place 2 drops into both eyes daily as needed (ALLERGIES).    Marland Kitchen travoprost, benzalkonium, (TRAVATAN) 0.004 % ophthalmic solution Place 1 drop into both eyes at bedtime.     . traZODone (DESYREL) 50 MG tablet Take 50 mg by mouth at bedtime as needed for sleep.    Marland Kitchen zolpidem (AMBIEN) 10 MG tablet Take 10 mg by mouth at bedtime as needed for sleep.     No current facility-administered medications for this visit.    OBJECTIVE:  Middle-aged white Garza in no acute distress Filed Vitals:   03/26/16 0837  BP: 131/90  Pulse: 79  Temp: 98.7 F (  37.1 C)  Resp: 18     Body mass index is 30.81 kg/(m^2).    ECOG FS:0 - Asymptomatic  Skin: warm, dry  HEENT: sclerae anicteric, conjunctivae pink, oropharynx clear. No thrush or mucositis.  Lymph Nodes: No cervical or supraclavicular lymphadenopathy  Lungs: clear to auscultation bilaterally, no rales, wheezes, or rhonci  Heart: regular rate and rhythm  Abdomen: round, soft, non tender, positive bowel sounds  Musculoskeletal: No focal spinal tenderness, no peripheral edema  Neuro: non focal, well oriented, positive affect  Breasts: right breast status post lumpectomy and radiation. No evidence of recurrent disease. Right axilla benign. Left breast unremarkable. Psoriasis outbreak under left breast to midline chest.  LAB RESULTS:  CMP     Component Value Date/Time   NA 138 10/03/2015 0912   NA 143 06/17/2009 1137   K 4.0 10/03/2015 0912   K 4.7 06/17/2009 1137   CL 114* 06/17/2009 1137   CO2 28 10/03/2015 0912   CO2 24 06/17/2009 1137   GLUCOSE 87 10/03/2015 0912   GLUCOSE 97  06/17/2009 1137   GLUCOSE 81 10/22/2006 1253   BUN 15.8 10/03/2015 0912   BUN 16 06/17/2009 1137   CREATININE 1.1 10/03/2015 0912   CREATININE 1.2 06/17/2009 1137   CALCIUM 9.7 10/03/2015 0912   CALCIUM 9.4 06/17/2009 1137   PROT 6.9 10/03/2015 0912   PROT 7.0 06/17/2009 1137   ALBUMIN 3.6 10/03/2015 0912   ALBUMIN 3.9 06/17/2009 1137   AST 17 10/03/2015 0912   AST 15 06/17/2009 1137   ALT 19 10/03/2015 0912   ALT 13 06/17/2009 1137   ALKPHOS 130 10/03/2015 0912   ALKPHOS 55 06/17/2009 1137   BILITOT <0.30 10/03/2015 0912   BILITOT 0.7 06/17/2009 1137   GFRNONAA 49.82 06/17/2009 1137    I No results found for: SPEP  Lab Results  Component Value Date   WBC 6.9 03/26/2016   NEUTROABS 4.2 03/26/2016   HGB 12.2 03/26/2016   HCT 35.9 03/26/2016   MCV 99.3 03/26/2016   PLT 220 03/26/2016      Chemistry      Component Value Date/Time   NA 138 10/03/2015 0912   NA 143 06/17/2009 1137   K 4.0 10/03/2015 0912   K 4.7 06/17/2009 1137   CL 114* 06/17/2009 1137   CO2 28 10/03/2015 0912   CO2 24 06/17/2009 1137   BUN 15.8 10/03/2015 0912   BUN 16 06/17/2009 1137   CREATININE 1.1 10/03/2015 0912   CREATININE 1.2 06/17/2009 1137      Component Value Date/Time   CALCIUM 9.7 10/03/2015 0912   CALCIUM 9.4 06/17/2009 1137   ALKPHOS 130 10/03/2015 0912   ALKPHOS 55 06/17/2009 1137   AST 17 10/03/2015 0912   AST 15 06/17/2009 1137   ALT 19 10/03/2015 0912   ALT 13 06/17/2009 1137   BILITOT <0.30 10/03/2015 0912   BILITOT 0.7 06/17/2009 1137      No results found for: LABCA2  No components found for: LABCA125  No results for input(s): INR in the last 168 hours.  Urinalysis No results found for: COLORURINE  STUDIES:  No results found.   ASSESSMENT: 60 y.o. Melissa Garza status post right breast upper inner quadrant biopsy 08/04/2014 for a clinical T2 N0, stage IIA invasive ductal carcinoma, grade 3, triple negative, with an MIB-1 of 90%.  1. neoadjuvant  chemotherapy with doxorubicin and cyclophosphamide dose dense x4 completed on 10/05/14; followed by paclitaxel weekly x12 started 10/19/2014. Carboplatin added for cycles 6-12,  completed 01/04/15  2. Right lumpectomy and sentinel lymph node sampling 02/01/2015 showed a complete pathologic response.  3.adjuvant radiation04/26/2016 through 04/08/2015:    Right breast 5040 cGy in 28 sessions (no boost)    PLAN: Melissa Garza is doing well as far as her breast cancer is concerned. Her breast exam is normal. The labs were reviewed in detail and were stable.   I encouraged her to begin an exercise regimen despite her current fatigue, as it may help her generate more energy and also help her sleep better at night. She does not seem interested.   Melissa Garza is due for a repeat mammogram in October. She will return in November for follow up with Dr. Jana Hakim. She understands and agrees with this plan. She has been encouraged to call with any issues that might arise before her next visit here.   Melissa Panda, NP 03/26/2016 9:03 AM

## 2016-04-11 ENCOUNTER — Other Ambulatory Visit: Payer: Self-pay | Admitting: Nurse Practitioner

## 2016-04-16 ENCOUNTER — Other Ambulatory Visit: Payer: Self-pay | Admitting: *Deleted

## 2016-04-16 DIAGNOSIS — C50211 Malignant neoplasm of upper-inner quadrant of right female breast: Secondary | ICD-10-CM

## 2016-04-16 MED ORDER — GABAPENTIN 300 MG PO CAPS: 300.0000 mg | ORAL_CAPSULE | Freq: Every day | ORAL | Status: DC

## 2016-04-30 ENCOUNTER — Other Ambulatory Visit: Payer: Self-pay | Admitting: Obstetrics & Gynecology

## 2016-05-01 LAB — CYTOLOGY - PAP

## 2016-08-01 ENCOUNTER — Other Ambulatory Visit: Payer: Self-pay | Admitting: Oncology

## 2016-08-01 DIAGNOSIS — Z853 Personal history of malignant neoplasm of breast: Secondary | ICD-10-CM

## 2016-08-08 ENCOUNTER — Ambulatory Visit
Admission: RE | Admit: 2016-08-08 | Discharge: 2016-08-08 | Disposition: A | Discharge status: Home / Self Care | Payer: 59 | Source: Ambulatory Visit | Attending: Oncology | Admitting: Oncology

## 2016-08-08 DIAGNOSIS — Z853 Personal history of malignant neoplasm of breast: Secondary | ICD-10-CM

## 2016-09-17 ENCOUNTER — Telehealth: Payer: Self-pay | Admitting: Oncology

## 2016-09-17 NOTE — Telephone Encounter (Signed)
Appointments rescheduled per patient request to have late afternoon appointments per her work schedule. Patient prefers appointments close to 4PM.

## 2016-09-25 ENCOUNTER — Ambulatory Visit: Payer: 59 | Admitting: Oncology

## 2016-09-25 ENCOUNTER — Other Ambulatory Visit: Payer: 59

## 2016-11-02 ENCOUNTER — Other Ambulatory Visit: Payer: Self-pay | Admitting: *Deleted

## 2016-11-02 DIAGNOSIS — C50211 Malignant neoplasm of upper-inner quadrant of right female breast: Secondary | ICD-10-CM

## 2016-11-05 DIAGNOSIS — H269 Unspecified cataract: Secondary | ICD-10-CM

## 2016-11-05 HISTORY — DX: Unspecified cataract: H26.9

## 2016-11-05 HISTORY — PX: COLONOSCOPY: SHX174

## 2016-11-06 ENCOUNTER — Other Ambulatory Visit (HOSPITAL_BASED_OUTPATIENT_CLINIC_OR_DEPARTMENT_OTHER): Payer: 59

## 2016-11-06 DIAGNOSIS — C50211 Malignant neoplasm of upper-inner quadrant of right female breast: Secondary | ICD-10-CM | POA: Diagnosis not present

## 2016-11-06 LAB — COMPREHENSIVE METABOLIC PANEL
ALT: 17 U/L (ref 0–55)
AST: 16 U/L (ref 5–34)
Albumin: 3.8 g/dL (ref 3.5–5.0)
Alkaline Phosphatase: 155 U/L — ABNORMAL HIGH (ref 40–150)
Anion Gap: 11 mEq/L (ref 3–11)
BUN: 17.5 mg/dL (ref 7.0–26.0)
CALCIUM: 9.5 mg/dL (ref 8.4–10.4)
CHLORIDE: 105 meq/L (ref 98–109)
CO2: 25 meq/L (ref 22–29)
Creatinine: 1.1 mg/dL (ref 0.6–1.1)
EGFR: 56 mL/min/{1.73_m2} — ABNORMAL LOW (ref >90)
GLUCOSE: 73 mg/dL (ref 70–140)
POTASSIUM: 4.1 meq/L (ref 3.5–5.1)
SODIUM: 140 meq/L (ref 136–145)
Total Bilirubin: 0.23 mg/dL (ref 0.20–1.20)
Total Protein: 7.3 g/dL (ref 6.4–8.3)

## 2016-11-06 LAB — CBC WITH DIFFERENTIAL/PLATELET
BASO%: 0.3 % (ref 0.0–2.0)
BASOS ABS: 0 10*3/uL (ref 0.0–0.1)
EOS%: 1.6 % (ref 0.0–7.0)
Eosinophils Absolute: 0.1 10*3/uL (ref 0.0–0.5)
HCT: 37.5 % (ref 34.8–46.6)
HGB: 12.6 g/dL (ref 11.6–15.9)
LYMPH%: 26.8 % (ref 14.0–49.7)
MCH: 32.6 pg (ref 25.1–34.0)
MCHC: 33.6 g/dL (ref 31.5–36.0)
MCV: 97.2 fL (ref 79.5–101.0)
MONO#: 0.6 10*3/uL (ref 0.1–0.9)
MONO%: 6.2 % (ref 0.0–14.0)
NEUT#: 5.8 10*3/uL (ref 1.5–6.5)
NEUT%: 65.1 % (ref 38.4–76.8)
Platelets: 256 10*3/uL (ref 145–400)
RBC: 3.86 10*6/uL (ref 3.70–5.45)
RDW: 13.2 % (ref 11.2–14.5)
WBC: 8.9 10*3/uL (ref 3.9–10.3)
lymph#: 2.4 10*3/uL (ref 0.9–3.3)

## 2016-11-07 ENCOUNTER — Ambulatory Visit (HOSPITAL_BASED_OUTPATIENT_CLINIC_OR_DEPARTMENT_OTHER): Payer: 59 | Admitting: Oncology

## 2016-11-07 DIAGNOSIS — Z171 Estrogen receptor negative status [ER-]: Secondary | ICD-10-CM

## 2016-11-07 DIAGNOSIS — C50211 Malignant neoplasm of upper-inner quadrant of right female breast: Secondary | ICD-10-CM | POA: Diagnosis not present

## 2016-11-07 DIAGNOSIS — L409 Psoriasis, unspecified: Secondary | ICD-10-CM | POA: Diagnosis not present

## 2016-11-07 DIAGNOSIS — Z853 Personal history of malignant neoplasm of breast: Secondary | ICD-10-CM | POA: Insufficient documentation

## 2016-11-07 NOTE — Progress Notes (Signed)
[facility]  Telephone:(336) [phone] Fax:(336) [phone]     ID: ZENOLA VROMAN DOB: [birth year]  MR#: [id number]  [id number]  Patient Care Team: No Pcp Per Patient as PCP - General (General Practice) Gus Height, MD as Consulting Physician (Obstetrics and Gynecology) Excell Seltzer, MD as Consulting Physician (General Surgery) Chauncey Cruel, MD as Consulting Physician (Oncology) Arloa Koh, MD as Consulting Physician (Radiation Oncology) OTHER MD: Chucky May MD  CHIEF COMPLAINT: Triple negative breast cancer  CURRENT TREATMENT: Observation  BREAST CANCER HISTORY: From the original intake note:  Nitra herself palpated a mass in her right breast approximately mid-September. She brought this to her gynecologist attention and he set her up for bilateral diagnostic mammography at the breast center 08/04/2014. The breast density was category C. in the patient has bilateral saline implants in place. In the palpable area of concern in the right breast there was an irregular mass measuring up to 2.5 cm. By palpation this was firm at the 12:30 o'clock position. Ultrasound of the right breast confirmed an irregular hypoechoic mass in this area measuring 2.5 cm. There was no right axillary lymphadenopathy noted.  Biopsy of the mass in question 08/04/2014 showed ([id number]) invasive ductal carcinoma, grade 3, triple negative, with an MIB-1 of 90%.  Bilateral breast MRI 08/09/2014 showed in the upper inner quadrant of the right breast an irregular enhancing mass measuring 2.8 cm. There was a 4 mm satellite nodule anterior to this and separated from that by 0.6 cm. The left breast, the remaining of the right breast and the lymph node areas were negative.  The patient's subsequent history is as detailed below  INTERVAL HISTORY: Dazzlyn returns today for follow up of her breast cancer. Since her last visit here she has been diagnosed with psoriasis. Otherwise she  enjoyed the recent holidays with her family and continues to work at the same job.  REVIEW OF SYSTEMS:  Unfortunately Myers is not exercising. She does a little bit of walking and her job but this is going to be less than a mile a day from her account. She's also behind in her health maintenance in general as she doesn't have a primary care physician but uses urgent care on an as-needed basis. Aside from these issues a detailed review of systems today was noncontributory  PAST MEDICAL HISTORY: Past Medical History:  Diagnosis Date  . Anxiety   . Cancer [facility])    cancer of right breast  . Depression   . GERD (gastroesophageal reflux disease)    PMH  . Glaucoma   . Headache    chronic  . Hepatitis    Hx: of Hepatitis not sure which one  . Hot flashes   . PONV (postoperative nausea and vomiting)   . S/P radiation therapy 03/01/2015 through 06/03/201604/26/2016 through 04/08/2015     Right breast 5040 cGy in 28 sessions (no boost)  . Shortness of breath    with exertion    PAST SURGICAL HISTORY: Past Surgical History:  Procedure Laterality Date  . BREAST ENHANCEMENT SURGERY  2008  . BREAST SURGERY     Biopsy  . COLONOSCOPY    . DILATION AND CURETTAGE OF UTERUS    . EYE SURGERY     Lasik  . PORT-A-CATH REMOVAL N/A 02/01/2015   Procedure: REMOVAL PORT-A-CATH;  Surgeon: Excell Seltzer, MD;  Location: [place];  Service: General;  Laterality: N/A;  . PORTACATH PLACEMENT N/A 08/13/2014   Procedure: INSERTION PORT-A-CATH;  Surgeon:  Excell Seltzer, MD;  Location: [place];  Service: General;  Laterality: N/A;  . TONSILECTOMY/ADENOIDECTOMY WITH MYRINGOTOMY    . TUBAL LIGATION      FAMILY HISTORY Family History  Problem Relation Age of Onset  . Psoriasis Mother   . Hypertension Father   . Asthma Other   . Thyroid disease Other    The patient's parents are  currently alive, in their late 18s. The patient had no brothers, 2 sisters. There is no history of breast or ovarian cancer in the family to her knowledge.   GYNECOLOGIC HISTORY:  No LMP recorded. Patient is postmenopausal. Menarche age 57, first live birth age 70. She is GX P2. She stopped having periods in 2005 and started hormone replacement at that time. 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 Her husband, [name], is disabled secondary to multiple cardiac problems. Son [name] lives in [place] and works in apartment maintenance. Son [name] is his wife) works as an Clinical biochemist. The patient has 6 grandchildren. She is not a Ambulance person.   ADVANCED DIRECTIVES: Not in place   HEALTH MAINTENANCE: Social History  Substance Use Topics  . Smoking status: Never Smoker  . Smokeless tobacco: Never Used  . Alcohol use No     Colonoscopy: 2008/ [facility] Iles  [id number]  Bone density:Never  Lipid panel:  Allergies  Allergen Reactions  . Hydrocodone Hives and Itching    Current Outpatient Prescriptions  Medication Sig Dispense Refill  . acetaminophen (TYLENOL) 500 MG tablet Take 1,000 mg by mouth every 6 (six) hours as needed for mild pain.    Marland Kitchen ALPRAZolam (XANAX) 1 MG tablet Take 1 mg by mouth 4 (four) times daily as needed for anxiety.    . Cyanocobalamin (VITAMIN B 12 PO) Take by mouth daily.    Marland Kitchen FLUoxetine (PROZAC) 40 MG capsule Take 40 mg by mouth daily.    Marland Kitchen gabapentin (NEURONTIN) 300 MG capsule Take 1 capsule (300 mg total) by mouth at bedtime. 90 capsule 0  . hydrocortisone 2.5 % lotion Apply topically 2 (two) times daily.    Lowanda Foster ([facility] OP) Apply to eye.    . methylphenidate 54 MG PO CR tablet Take 54 mg by mouth every morning.    . naproxen (NAPROSYN) 250 MG tablet Take by mouth 2 (two) times daily with a meal.    .  PARoxetine (PAXIL) 20 MG tablet Take 20 mg by mouth daily.    . Polyvinyl Alcohol-Povidone (REFRESH OP) Place 2 drops into both eyes daily as needed (ALLERGIES).    Marland Kitchen travoprost, benzalkonium, (TRAVATAN) 0.004 % ophthalmic solution Place 1 drop into both eyes at bedtime.     . traZODone (DESYREL) 50 MG tablet Take 50 mg by mouth at bedtime as needed for sleep.    Marland Kitchen zolpidem (AMBIEN) 10 MG tablet Take 10 mg by mouth at bedtime as needed for sleep.     No current facility-administered medications for this visit.     OBJECTIVE:  Middle-aged white woman Who appears well  Vitals:   11/07/16 1537  BP: (!) 154/99  Pulse: 88  Resp: 18  Temp: 98.2 F (36.8 C)     Body mass index is 32.84 kg/m.    ECOG FS:0 - Asymptomatic  Sclerae unicteric, pupils round and equal Oropharynx clear and moist-- no thrush or other lesions  No cervical or supraclavicular adenopathy Lungs no rales or rhonchi Heart regular rate and rhythm Abd soft, nontender, positive bowel sounds MSK no focal spinal tenderness, no upper extremity lymphedema Neuro: nonfocal, well oriented, appropriate affect Breasts: The right breast has undergone lumpectomy followed by radiation, with no evidence of disease recurrence. The right axilla is benign. The left breast is unremarkable Skin: There are mild sore I assist rashes in the inframammary fold bilaterally, left greater than right   LAB RESULTS:  CMP     Component Value Date/Time   NA 140 11/06/2016 1522   K 4.1 11/06/2016 1522   CL 114 (H) 06/17/2009 1137   CO2 25 11/06/2016 1522   GLUCOSE 73 11/06/2016 1522   GLUCOSE 81 10/22/2006 1253   BUN 17.5 11/06/2016 1522   CREATININE 1.1 11/06/2016 1522   CALCIUM 9.5 11/06/2016 1522   PROT 7.3 11/06/2016 1522   ALBUMIN 3.8 11/06/2016 1522   AST 16 11/06/2016 1522   ALT 17 11/06/2016 1522   ALKPHOS 155 (H) 11/06/2016 1522   BILITOT 0.23 11/06/2016 1522   GFRNONAA 49.82 06/17/2009 1137    I No results found for:  SPEP  Lab Results  Component Value Date   WBC 8.9 11/06/2016   NEUTROABS 5.8 11/06/2016   HGB 12.6 11/06/2016   HCT 37.5 11/06/2016   MCV 97.2 11/06/2016   PLT 256 11/06/2016      Chemistry      Component Value Date/Time   NA 140 11/06/2016 1522   K 4.1 11/06/2016 1522   CL 114 (H) 06/17/2009 1137   CO2 25 11/06/2016 1522   BUN 17.5 11/06/2016 1522   CREATININE 1.1 11/06/2016 1522      Component Value Date/Time   CALCIUM 9.5 11/06/2016 1522   ALKPHOS 155 (H) 11/06/2016 1522   AST 16 11/06/2016 1522   ALT 17 11/06/2016 1522   BILITOT 0.23 11/06/2016 1522      No results found for: LABCA2  No components found for: LABCA125  No results for input(s): INR in the last 168 hours.  Urinalysis No results found for: COLORURINE  STUDIES: CLINICAL DATA:  Right lumpectomy.  Annual mammography.  EXAM: 2D DIGITAL DIAGNOSTIC BILATERAL MAMMOGRAM WITH IMPLANTS, CAD AND ADJUNCT TOMO  The patient has retropectoral implants. Standard and implant displaced views were performed.  COMPARISON:  Previous exam(s).  ACR Breast Density Category b: There are scattered areas of fibroglandular density.  FINDINGS: The right lumpectomy site is stable. No suspicious masses, calcifications, or distortion.  Mammographic images were processed with CAD.  IMPRESSION: No mammographic evidence of malignancy.  RECOMMENDATION: Annual diagnostic mammography.  I have discussed the findings and recommendations with the patient. Results were also provided in writing at the conclusion of the visit. If applicable, a reminder letter will be sent to the patient regarding the next appointment.  BI-RADS CATEGORY  2: Benign.   Electronically Signed   By: Dorise Bullion III M.D   On: 08/08/2016 08:58   ASSESSMENT: 61 y.o. [place] woman status post right breast upper inner quadrant biopsy 08/04/2014 for a clinical T2 N0, stage IIA invasive ductal carcinoma, grade 3, triple  negative, with an MIB-1 of 90%.  1. neoadjuvant chemotherapy with doxorubicin and cyclophosphamide dose dense x4 completed on 10/05/14; followed by paclitaxel weekly x12 started 10/19/2014. Carboplatin added for cycles 6-12, completed 01/04/15  2. Right lumpectomy and sentinel lymph node sampling 02/01/2015 showed a complete pathologic response.  3.adjuvant radiation 03/01/2015 through 04/08/2015:    Right breast 5040  cGy in 28 sessions (no boost)    PLAN: Darlina Shiverdecker nearly 2 years out from definitive surgery for her breast cancer with no evidence of disease recurrence. This is very favorable.  She is behind on her health maintenance that it has been exactly 10 years since her last colonoscopy. We discussed this and she agreed to referral, which was placed today. I would anticipate she will have a colonoscopy sometime in the next few months.  She will have her next mammogram in October and I will see her shortly after that.  I encouraged her to start a regular exercise program. She understands this will help her bones, her heart, her weight, her mood, and may at least to a small degree decrease the risk of developing another cancer in the future  Verneta Hamidi C, MD 11/07/2016 4:54 PM

## 2016-11-08 ENCOUNTER — Encounter: Payer: Self-pay | Admitting: Gastroenterology

## 2016-12-28 ENCOUNTER — Encounter: Payer: Self-pay | Admitting: Gastroenterology

## 2016-12-28 ENCOUNTER — Ambulatory Visit (AMBULATORY_SURGERY_CENTER): Payer: Self-pay

## 2016-12-28 VITALS — Ht 61.5 in | Wt 165.8 lb

## 2016-12-28 DIAGNOSIS — Z1211 Encounter for screening for malignant neoplasm of colon: Secondary | ICD-10-CM

## 2016-12-28 MED ORDER — NA SULFATE-K SULFATE-MG SULF 17.5-3.13-1.6 GM/177ML PO SOLN: 1.0000 | Freq: Once | ORAL | 0 refills | Status: AC

## 2017-01-09 ENCOUNTER — Telehealth: Payer: Self-pay | Admitting: Gastroenterology

## 2017-01-09 NOTE — Telephone Encounter (Signed)
Patient had put the 239 gm of Miralax into the 32 ounces of Gatorade. She will put in another 32 ounces of Gatorade. Otherwise follow her written instructions for the 2 day bowel prep.

## 2017-01-11 ENCOUNTER — Ambulatory Visit (AMBULATORY_SURGERY_CENTER): Payer: 59 | Admitting: Gastroenterology

## 2017-01-11 ENCOUNTER — Encounter: Payer: Self-pay | Admitting: Gastroenterology

## 2017-01-11 VITALS — BP 135/81 | HR 77 | Temp 99.1°F | Resp 10 | Ht 61.0 in | Wt 173.0 lb

## 2017-01-11 DIAGNOSIS — D120 Benign neoplasm of cecum: Secondary | ICD-10-CM

## 2017-01-11 DIAGNOSIS — D122 Benign neoplasm of ascending colon: Secondary | ICD-10-CM

## 2017-01-11 DIAGNOSIS — Z1212 Encounter for screening for malignant neoplasm of rectum: Secondary | ICD-10-CM

## 2017-01-11 DIAGNOSIS — Z1211 Encounter for screening for malignant neoplasm of colon: Secondary | ICD-10-CM

## 2017-01-11 MED ORDER — SODIUM CHLORIDE 0.9 % IV SOLN: 500.0000 mL | INTRAVENOUS | Status: DC

## 2017-01-11 NOTE — Progress Notes (Signed)
No change in health history since he pre visit on 12/28/2016 [facility]

## 2017-01-11 NOTE — Patient Instructions (Signed)
Handouts given on Polyps and hemorrhoids   YOU HAD AN ENDOSCOPIC PROCEDURE TODAY: Refer to the procedure report and other information in the discharge instructions given to you for any specific questions about what was found during the examination. If this information does not answer your questions, please call [place] office at [phone] to clarify.   YOU SHOULD EXPECT: Some feelings of bloating in the abdomen. Passage of more gas than usual. Walking can help get rid of the air that was put into your GI tract during the procedure and reduce the bloating. If you had a lower endoscopy (such as a colonoscopy or flexible sigmoidoscopy) you may notice spotting of blood in your stool or on the toilet paper. Some abdominal soreness may be present for a day or two, also.  DIET: Your first meal following the procedure should be a light meal and then it is ok to progress to your normal diet. A half-sandwich or bowl of soup is an example of a good first meal. Heavy or fried foods are harder to digest and may make you feel nauseous or bloated. Drink plenty of fluids but you should avoid alcoholic beverages for 24 hours. If you had a esophageal dilation, please see attached instructions for diet.    ACTIVITY: Your care partner should take you home directly after the procedure. You should plan to take it easy, moving slowly for the rest of the day. You can resume normal activity the day after the procedure however YOU SHOULD NOT DRIVE, use power tools, machinery or perform tasks that involve climbing or major physical exertion for 24 hours (because of the sedation medicines used during the test).   SYMPTOMS TO REPORT IMMEDIATELY: A gastroenterologist can be reached at any hour. Please call [phone]  for any of the following symptoms:  Following lower endoscopy (colonoscopy, flexible sigmoidoscopy) Excessive amounts of blood in the stool  Significant tenderness, worsening of abdominal pains  Swelling of the  abdomen that is new, acute  Fever of 100 or higher   FOLLOW UP:  If any biopsies were taken you will be contacted by phone or by letter within the next 1-3 weeks. Call [phone]  if you have not heard about the biopsies in 3 weeks.  Please also call with any specific questions about appointments or follow up tests.

## 2017-01-11 NOTE — Progress Notes (Signed)
To PACU VSS report to [facility]

## 2017-01-11 NOTE — Op Note (Signed)
[place] Patient Name: Melissa Garza Procedure Date: 01/11/2017 9:44 AM MRN: [id number] Endoscopist: Mauri Pole , MD Age: 61 Referring MD:  Date of Birth: June 13, 1956 Gender: Female Account #: [id number] Procedure:                Colonoscopy Indications:              Screening for colorectal malignant neoplasm, Last                            colonoscopy: 2008 Medicines:                Monitored Anesthesia Care Procedure:                Pre-Anesthesia Assessment:                           - Prior to the procedure, a History and Physical                            was performed, and patient medications and                            allergies were reviewed. The patient's tolerance of                            previous anesthesia was also reviewed. The risks                            and benefits of the procedure and the sedation                            options and risks were discussed with the patient.                            All questions were answered, and informed consent                            was obtained. Prior Anticoagulants: The patient has                            taken no previous anticoagulant or antiplatelet                            agents. ASA Grade Assessment: II - A patient with                            mild systemic disease. After reviewing the risks                            and benefits, the patient was deemed in                            satisfactory condition to undergo the procedure.  After obtaining informed consent, the colonoscope                            was passed under direct vision. Throughout the                            procedure, the patient's blood pressure, pulse, and                            oxygen saturations were monitored continuously. The                            Colonoscope was introduced through the anus and                            advanced to the the cecum,  identified by                            appendiceal orifice and ileocecal valve. The                            colonoscopy was performed without difficulty. The                            patient tolerated the procedure well. The quality                            of the bowel preparation was excellent. The                            terminal ileum, ileocecal valve, appendiceal                            orifice, and rectum were photographed. Scope In: 9:54:01 AM Scope Out: 10:04:55 AM Scope Withdrawal Time: 0 hours 7 minutes 49 seconds  Total Procedure Duration: 0 hours 10 minutes 54 seconds  Findings:                 The perianal and digital rectal examinations were                            normal.                           A 7 mm polyp was found in the cecum. The polyp was                            sessile. The polyp was removed with a cold snare.                            Resection and retrieval were complete.                           Two sessile polyps were found in the ascending  colon. The polyps were 1 to 2 mm in size. These                            polyps were removed with a cold biopsy forceps.                            Resection and retrieval were complete.                           A few small-mouthed diverticula were found in the                            sigmoid colon and descending colon.                           Non-bleeding internal hemorrhoids were found during                            retroflexion. The hemorrhoids were small.                           The exam was otherwise without abnormality. Complications:            No immediate complications. Estimated Blood Loss:     Estimated blood loss was minimal. Impression:               - One 7 mm polyp in the cecum, removed with a cold                            snare. Resected and retrieved.                           - Two 1 to 2 mm polyps in the ascending colon,                             removed with a cold biopsy forceps. Resected and                            retrieved.                           - Diverticulosis in the sigmoid colon and in the                            descending colon.                           - Non-bleeding internal hemorrhoids.                           - The examination was otherwise normal. Recommendation:           - Patient has a contact number available for                            emergencies. The  signs and symptoms of potential                            delayed complications were discussed with the                            patient. Return to normal activities tomorrow.                            Written discharge instructions were provided to the                            patient.                           - Resume previous diet.                           - Continue present medications.                           - Await pathology results.                           - Repeat colonoscopy in 3 - 5 years for                            surveillance based on pathology results.                           - Return to GI clinic PRN. Mauri Pole, MD 01/11/2017 10:10:41 AM This report has been signed electronically.

## 2017-01-14 ENCOUNTER — Telehealth: Payer: Self-pay

## 2017-01-14 NOTE — Telephone Encounter (Signed)
Left message

## 2017-01-15 ENCOUNTER — Telehealth: Payer: Self-pay | Admitting: *Deleted

## 2017-01-15 ENCOUNTER — Encounter: Payer: Self-pay | Admitting: Gastroenterology

## 2017-01-15 NOTE — Telephone Encounter (Signed)
  Follow up Call-  Call back number 01/11/2017  Post procedure Call Back phone  # [phone]  Permission to leave phone message Yes  Some recent data might be hidden   [facility]

## 2017-02-04 ENCOUNTER — Other Ambulatory Visit: Payer: Self-pay | Admitting: Adult Health

## 2017-02-04 DIAGNOSIS — Z171 Estrogen receptor negative status [ER-]: Principal | ICD-10-CM

## 2017-02-04 DIAGNOSIS — C50211 Malignant neoplasm of upper-inner quadrant of right female breast: Secondary | ICD-10-CM

## 2017-02-05 ENCOUNTER — Other Ambulatory Visit (HOSPITAL_BASED_OUTPATIENT_CLINIC_OR_DEPARTMENT_OTHER): Payer: 59

## 2017-02-05 DIAGNOSIS — C50211 Malignant neoplasm of upper-inner quadrant of right female breast: Secondary | ICD-10-CM

## 2017-02-05 DIAGNOSIS — Z171 Estrogen receptor negative status [ER-]: Principal | ICD-10-CM

## 2017-02-05 LAB — CBC WITH DIFFERENTIAL/PLATELET
BASO%: 0.5 % (ref 0.0–2.0)
Basophils Absolute: 0 10*3/uL (ref 0.0–0.1)
EOS%: 1.8 % (ref 0.0–7.0)
Eosinophils Absolute: 0.1 10*3/uL (ref 0.0–0.5)
HCT: 38.2 % (ref 34.8–46.6)
HGB: 12.9 g/dL (ref 11.6–15.9)
LYMPH%: 28.8 % (ref 14.0–49.7)
MCH: 32.5 pg (ref 25.1–34.0)
MCHC: 33.7 g/dL (ref 31.5–36.0)
MCV: 96.6 fL (ref 79.5–101.0)
MONO#: 0.4 10*3/uL (ref 0.1–0.9)
MONO%: 5 % (ref 0.0–14.0)
NEUT%: 63.9 % (ref 38.4–76.8)
NEUTROS ABS: 5 10*3/uL (ref 1.5–6.5)
Platelets: 240 10*3/uL (ref 145–400)
RBC: 3.96 10*6/uL (ref 3.70–5.45)
RDW: 14 % (ref 11.2–14.5)
WBC: 7.8 10*3/uL (ref 3.9–10.3)
lymph#: 2.2 10*3/uL (ref 0.9–3.3)

## 2017-02-05 LAB — COMPREHENSIVE METABOLIC PANEL
ALT: 13 U/L (ref 0–55)
AST: 15 U/L (ref 5–34)
Albumin: 4 g/dL (ref 3.5–5.0)
Alkaline Phosphatase: 130 U/L (ref 40–150)
Anion Gap: 9 mEq/L (ref 3–11)
BILIRUBIN TOTAL: 0.37 mg/dL (ref 0.20–1.20)
BUN: 15.2 mg/dL (ref 7.0–26.0)
CO2: 27 meq/L (ref 22–29)
CREATININE: 1 mg/dL (ref 0.6–1.1)
Calcium: 9.7 mg/dL (ref 8.4–10.4)
Chloride: 108 mEq/L (ref 98–109)
EGFR: 58 mL/min/{1.73_m2} — ABNORMAL LOW (ref >90)
GLUCOSE: 74 mg/dL (ref 70–140)
Potassium: 3.8 mEq/L (ref 3.5–5.1)
SODIUM: 143 meq/L (ref 136–145)
TOTAL PROTEIN: 7.2 g/dL (ref 6.4–8.3)

## 2017-08-21 ENCOUNTER — Other Ambulatory Visit (HOSPITAL_BASED_OUTPATIENT_CLINIC_OR_DEPARTMENT_OTHER): Payer: 59

## 2017-08-21 DIAGNOSIS — C50211 Malignant neoplasm of upper-inner quadrant of right female breast: Secondary | ICD-10-CM

## 2017-08-21 DIAGNOSIS — Z171 Estrogen receptor negative status [ER-]: Principal | ICD-10-CM

## 2017-08-21 LAB — COMPREHENSIVE METABOLIC PANEL
ALT: 16 U/L (ref 0–55)
ANION GAP: 7 meq/L (ref 3–11)
AST: 18 U/L (ref 5–34)
Albumin: 3.7 g/dL (ref 3.5–5.0)
Alkaline Phosphatase: 98 U/L (ref 40–150)
BUN: 17.4 mg/dL (ref 7.0–26.0)
CHLORIDE: 109 meq/L (ref 98–109)
CO2: 25 meq/L (ref 22–29)
Calcium: 8.8 mg/dL (ref 8.4–10.4)
Creatinine: 0.9 mg/dL (ref 0.6–1.1)
Glucose: 93 mg/dl (ref 70–140)
Potassium: 4.1 mEq/L (ref 3.5–5.1)
Sodium: 140 mEq/L (ref 136–145)
Total Protein: 6.5 g/dL (ref 6.4–8.3)

## 2017-08-21 LAB — CBC WITH DIFFERENTIAL/PLATELET
BASO%: 0.4 % (ref 0.0–2.0)
BASOS ABS: 0 10*3/uL (ref 0.0–0.1)
EOS%: 4.6 % (ref 0.0–7.0)
Eosinophils Absolute: 0.2 10*3/uL (ref 0.0–0.5)
HEMATOCRIT: 36.7 % (ref 34.8–46.6)
HEMOGLOBIN: 12 g/dL (ref 11.6–15.9)
LYMPH#: 2.2 10*3/uL (ref 0.9–3.3)
LYMPH%: 43.1 % (ref 14.0–49.7)
MCH: 32.4 pg (ref 25.1–34.0)
MCHC: 32.7 g/dL (ref 31.5–36.0)
MCV: 99.2 fL (ref 79.5–101.0)
MONO#: 0.4 10*3/uL (ref 0.1–0.9)
MONO%: 7 % (ref 0.0–14.0)
NEUT%: 44.9 % (ref 38.4–76.8)
NEUTROS ABS: 2.2 10*3/uL (ref 1.5–6.5)
Platelets: 186 10*3/uL (ref 145–400)
RBC: 3.7 10*6/uL (ref 3.70–5.45)
RDW: 12.9 % (ref 11.2–14.5)
WBC: 5 10*3/uL (ref 3.9–10.3)

## 2017-08-21 NOTE — Progress Notes (Signed)
[facility]  [phone] [phone]     ID: Melissa Garza DOB: 09/17/1956  MR#: [id number]  DGL#:[id number]  Patient Care Team: Patient, No Pcp Per as PCP - General (General Practice) Gus Height, MD as Consulting Physician (Obstetrics and Gynecology) Excell Seltzer, MD as Consulting Physician (General Surgery) Kiah Vanalstine, Virgie Dad, MD as Consulting Physician (Oncology) Arloa Koh, MD as Consulting Physician (Radiation Oncology) OTHER MD: Chucky May MD  CHIEF COMPLAINT: Triple negative breast cancer  CURRENT TREATMENT: Observation  BREAST CANCER HISTORY: From the original intake note:  Melissa Garza herself palpated a mass in her right breast approximately mid-September. She brought this to her gynecologist attention and he set her up for bilateral diagnostic mammography at the breast center 08/04/2014. The breast density was category C. in the patient has bilateral saline implants in place. In the palpable area of concern in the right breast there was an irregular mass measuring up to 2.5 cm. By palpation this was firm at the 12:30 o'clock position. Ultrasound of the right breast confirmed an irregular hypoechoic mass in this area measuring 2.5 cm. There was no right axillary lymphadenopathy noted.  Biopsy of the mass in question 08/04/2014 showed ([id number]) invasive ductal carcinoma, grade 3, triple negative, with an MIB-1 of 90%.  Bilateral breast MRI 08/09/2014 showed in the upper inner quadrant of the right breast an irregular enhancing mass measuring 2.8 cm. There was a 4 mm satellite nodule anterior to this and separated from that by 0.6 cm. The left breast, the remaining of the right breast and the lymph node areas were negative.  The patient's subsequent history is as detailed below  INTERVAL HISTORY: Melissa Garza returns today for follow-up of her estrogen receptor negative breast cancer. She is doing well overall. She reports that she is  still working at the same job and she now works 1st shift (6 AM-2 PM).   Since her last visit to the office, she has had a colonoscopy completed by Dr. Silverio Decamp on 01/11/2017 [phone]) with results that showed: Surgical [P], ascending and cecum, polyp (3). Tubular adenoma (X3). No high grade dysplasia or malignancy.     REVIEW OF SYSTEMS:  Melissa Garza reports that she is not exercising due to increased fatigue. She frequently walks at work and approximately takes 8,000 steps daily. She denies unusual headaches, visual changes, nausea, vomiting, or dizziness. There has been no unusual cough, phlegm production, or pleurisy. This been no change in bowel or bladder habits. She denies unexplained fatigue or unexplained weight loss, bleeding, rash, or fever. A detailed review of systems was otherwise stable.    PAST MEDICAL HISTORY: Past Medical History:  Diagnosis Date  . Anxiety   . Cancer [facility])    cancer of right breast  . Cataracts, bilateral 2018  . Depression   . Environmental allergies 2007  . GERD (gastroesophageal reflux disease)    PMH  . Glaucoma   . Headache    chronic  . Hepatitis    Hx: of Hepatitis not sure which one  . Hot flashes   . PONV (postoperative nausea and vomiting)   . S/P radiation therapy 03/01/2015 through 06/03/201604/26/2016 through 04/08/2015     Right breast 5040 cGy in 28 sessions (no boost)  . Shortness of breath    with exertion    PAST SURGICAL HISTORY: Past Surgical History:  Procedure Laterality Date  . BREAST ENHANCEMENT SURGERY  2008  . BREAST SURGERY     Biopsy  . COLONOSCOPY    .  DILATION AND CURETTAGE OF UTERUS    . EYE SURGERY     Lasik  . PORT-A-CATH REMOVAL N/A 02/01/2015   Procedure: REMOVAL PORT-A-CATH;  Surgeon: Excell Seltzer, MD;  Location: [facility];  Service: General;  Laterality: N/A;  . PORTACATH PLACEMENT  N/A 08/13/2014   Procedure: INSERTION PORT-A-CATH;  Surgeon: Excell Seltzer, MD;  Location: [facility];  Service: General;  Laterality: N/A;  . TONSILECTOMY/ADENOIDECTOMY WITH MYRINGOTOMY    . TUBAL LIGATION      FAMILY HISTORY Family History  Problem Relation Age of Onset  . Psoriasis Mother   . Hypertension Father   . Asthma Other   . Thyroid disease Other   . Colon polyps Neg Hx   . Esophageal cancer Neg Hx   . Rectal cancer Neg Hx   . Stomach cancer Neg Hx    The patient's parents are currently alive, in their late 2s. The patient had no brothers, 2 sisters. There is no history of breast or ovarian cancer in the family to her knowledge.   GYNECOLOGIC HISTORY:  No LMP recorded. Patient is postmenopausal. Menarche age 37, first live birth age 13. She is GX P2. She stopped having periods in 2005 and started hormone replacement at that time. 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 Her husband, [name], is disabled secondary to multiple cardiac problems. Son [name] lives in [place] and works in apartment maintenance. Son [name] is his wife) works as an Clinical biochemist. The patient has 6 grandchildren. She is not a Ambulance person.   ADVANCED DIRECTIVES: Not in place   HEALTH MAINTENANCE: Social History  Substance Use Topics  . Smoking status: Never Smoker  . Smokeless tobacco: Never Used  . Alcohol use No     Colonoscopy: 2008/ [facility]  EXB:[id number]  Bone density:Never  Lipid panel:  Allergies  Allergen Reactions  . Hydrocodone Hives and Itching    Current Outpatient Prescriptions  Medication Sig Dispense Refill  . acetaminophen (TYLENOL) 500 MG tablet Take 1,000 mg by mouth every 6 (six) hours as needed for mild pain.    Marland Kitchen ALPRAZolam (XANAX) 1 MG tablet Take 1 mg by mouth 4 (four) times daily as needed for anxiety.    .  Cyanocobalamin (VITAMIN B 12 PO) Take by mouth daily.    Marland Kitchen FLUoxetine (PROZAC) 40 MG capsule Take 40 mg by mouth daily.    Marland Kitchen gabapentin (NEURONTIN) 300 MG capsule Take 1 capsule (300 mg total) by mouth at bedtime. 90 capsule 0  . hydrocortisone 2.5 % lotion Apply topically 2 (two) times daily.    Lowanda Foster ([place] OP) Apply to eye.    . methylphenidate 54 MG PO CR tablet Take 54 mg by mouth every morning.    Marland Kitchen PARoxetine (PAXIL) 20 MG tablet Take 20 mg by mouth daily.    . Polyvinyl Alcohol-Povidone (REFRESH OP) Place 2 drops into both eyes daily as needed (ALLERGIES).    Marland Kitchen travoprost, benzalkonium, (TRAVATAN) 0.004 % ophthalmic solution Place 1 drop into both eyes at bedtime.     . traZODone (DESYREL) 50 MG tablet Take 50 mg by mouth at bedtime as needed for sleep.    Marland Kitchen zolpidem (AMBIEN) 10 MG tablet Take 10 mg by mouth at bedtime as needed for sleep.     Current Facility-Administered Medications  Medication Dose Route Frequency  Provider Last Rate Last Dose  . 0.9 %  sodium chloride infusion  500 mL Intravenous Continuous Nandigam, Venia Minks, MD        OBJECTIVE:  Middle-aged white woman In no acute distress  Vitals:   08/22/17 1542  BP: 127/83  Pulse: 86  Resp: 20  Temp: 97.7 F (36.5 C)  SpO2: 95%     Body mass index is 31.14 kg/m.    ECOG FS:0 - Asymptomatic  Sclerae unicteric, EOMs intact Oropharynx clear and moist No cervical or supraclavicular adenopathy Lungs no rales or rhonchi Heart regular rate and rhythm Abd soft, nontender, positive bowel sounds MSK no focal spinal tenderness, no upper extremity lymphedema Neuro: nonfocal, well oriented, appropriate affect Breasts: The right breast is status post lumpectomy and radiation with no evidence of recurrence. The left breast is benign. Both axillae are benign.  LAB RESULTS:  CMP     Component Value Date/Time   NA 140 08/21/2017 1544   K 4.1 08/21/2017 1544   CL 114 (H) 06/17/2009 1137   CO2 25 08/21/2017 1544    GLUCOSE 93 08/21/2017 1544   GLUCOSE 81 10/22/2006 1253   BUN 17.4 08/21/2017 1544   CREATININE 0.9 08/21/2017 1544   CALCIUM 8.8 08/21/2017 1544   PROT 6.5 08/21/2017 1544   ALBUMIN 3.7 08/21/2017 1544   AST 18 08/21/2017 1544   ALT 16 08/21/2017 1544   ALKPHOS 98 08/21/2017 1544   BILITOT <0.22 08/21/2017 1544   GFRNONAA 49.82 06/17/2009 1137    I No results found for: SPEP  Lab Results  Component Value Date   WBC 5.0 08/21/2017   NEUTROABS 2.2 08/21/2017   HGB 12.0 08/21/2017   HCT 36.7 08/21/2017   MCV 99.2 08/21/2017   PLT 186 08/21/2017      Chemistry      Component Value Date/Time   NA 140 08/21/2017 1544   K 4.1 08/21/2017 1544   CL 114 (H) 06/17/2009 1137   CO2 25 08/21/2017 1544   BUN 17.4 08/21/2017 1544   CREATININE 0.9 08/21/2017 1544      Component Value Date/Time   CALCIUM 8.8 08/21/2017 1544   ALKPHOS 98 08/21/2017 1544   AST 18 08/21/2017 1544   ALT 16 08/21/2017 1544   BILITOT <0.22 08/21/2017 1544      No results found for: LABCA2  No components found for: LABCA125  No results for input(s): INR in the last 168 hours.  Urinalysis No results found for: COLORURINE  STUDIES: Mammography is overdue   ASSESSMENT: 61 y.o. Melissa Garza woman status post right breast upper inner quadrant biopsy 08/04/2014 for a clinical T2 N0, stage IIA invasive ductal carcinoma, grade 3, triple negative, with an MIB-1 of 90%.  1. neoadjuvant chemotherapy with doxorubicin and cyclophosphamide dose dense x4 completed on 10/05/14; followed by paclitaxel weekly x12 started 10/19/2014. Carboplatin added for cycles 6-12, completed 01/04/15  2. Right lumpectomy and sentinel lymph node sampling 02/01/2015 showed a complete pathologic response.  3.adjuvant radiation 03/01/2015 through 04/08/2015:    Right breast 5040 cGy in 28 sessions (no boost)    PLAN: Melissa Garza is now 2-1/2 years out from  definitive surgery for her breast cancer with no evidence of disease recurrence. This is very favorable.  We discussed the fact that for estrogen receptor negative tumors if recurrence occurs it tends to be first 2 years. Accordingly she has already passed 1 major landmark.  Even though she is not exercising regularly she gets anywhere between 6 and 9000  steps a day because of work.  She is a bit behind on her mammography. I have gone ahead and placed the order for her to have this later this month.  She will see me again a year from now. She knows to call for any problems that may develop before that visit.  Gaytha Raybourn, Virgie Dad, MD  08/22/17 4:06 PM Medical Oncology and Hematology [facility] [address] [place], [place] [address] Tel. [phone]    Fax. [phone]  This document serves as a record of services personally performed by Lurline Del, MD. It was created on her behalf by Steva Colder, a trained medical scribe. The creation of this record is based on the scribe's personal observations and the provider's statements to them. This document has been checked and approved by the attending provider.

## 2017-08-22 ENCOUNTER — Telehealth: Payer: Self-pay | Admitting: Oncology

## 2017-08-22 ENCOUNTER — Ambulatory Visit (HOSPITAL_BASED_OUTPATIENT_CLINIC_OR_DEPARTMENT_OTHER): Payer: 59 | Admitting: Oncology

## 2017-08-22 VITALS — BP 127/83 | HR 86 | Temp 97.7°F | Resp 20 | Ht 61.0 in | Wt 164.8 lb

## 2017-08-22 DIAGNOSIS — Z171 Estrogen receptor negative status [ER-]: Secondary | ICD-10-CM

## 2017-08-22 DIAGNOSIS — C50211 Malignant neoplasm of upper-inner quadrant of right female breast: Secondary | ICD-10-CM

## 2017-08-22 NOTE — Telephone Encounter (Signed)
Scheduled appt per 10/18 los - gave patient AVS and calender per los.  

## 2017-11-06 ENCOUNTER — Ambulatory Visit
Admission: RE | Admit: 2017-11-06 | Discharge: 2017-11-06 | Disposition: A | Discharge status: Home / Self Care | Payer: 59 | Source: Ambulatory Visit | Attending: Adult Health | Admitting: Adult Health

## 2017-11-06 DIAGNOSIS — Z171 Estrogen receptor negative status [ER-]: Principal | ICD-10-CM

## 2017-11-06 DIAGNOSIS — C50211 Malignant neoplasm of upper-inner quadrant of right female breast: Secondary | ICD-10-CM

## 2017-11-06 DIAGNOSIS — R928 Other abnormal and inconclusive findings on diagnostic imaging of breast: Secondary | ICD-10-CM | POA: Diagnosis not present

## 2017-11-06 HISTORY — DX: Personal history of irradiation: Z92.3

## 2017-11-06 HISTORY — DX: Personal history of antineoplastic chemotherapy: Z92.21

## 2017-11-20 ENCOUNTER — Encounter: Payer: Self-pay | Admitting: Family Medicine

## 2017-11-20 ENCOUNTER — Ambulatory Visit: Payer: 59 | Admitting: Family Medicine

## 2017-11-20 VITALS — BP 126/80 | HR 102 | Ht 61.0 in | Wt 159.0 lb

## 2017-11-20 DIAGNOSIS — K5909 Other constipation: Secondary | ICD-10-CM

## 2017-11-20 DIAGNOSIS — E7800 Pure hypercholesterolemia, unspecified: Secondary | ICD-10-CM

## 2017-11-20 DIAGNOSIS — Z0000 Encounter for general adult medical examination without abnormal findings: Secondary | ICD-10-CM | POA: Diagnosis not present

## 2017-11-20 DIAGNOSIS — B349 Viral infection, unspecified: Secondary | ICD-10-CM | POA: Diagnosis not present

## 2017-11-20 DIAGNOSIS — E559 Vitamin D deficiency, unspecified: Secondary | ICD-10-CM | POA: Diagnosis not present

## 2017-11-20 MED ORDER — DOCUSATE SODIUM 100 MG PO CAPS: 100.0000 mg | ORAL_CAPSULE | Freq: Two times a day (BID) | ORAL | 0 refills | Status: DC

## 2017-11-20 NOTE — Progress Notes (Addendum)
Subjective:  Patient ID: Melissa Garza, female    DOB: [birth year]  Age: 62 y.o. MRN: [id number]  CC: Establish Care   HPI Melissa Garza presents for establishment of care and evaluation of a 4-day day history of malaise, fatigue, nasal congestion, postnasal drip, headache.  She denies fever chills, nausea and vomiting, cough rash or stiff neck.  During this time she has also had some lower abdominal cramping with constipation.  She denies any blood in her stool, hematuria, vaginal discharge or history of diverticulosis.  She is seeing psychiatry or depression and anxiety.  She has a history of breast cancer but has been cancer free for over a year now.  She just saw her oncologist back in October and was asked to follow-up in a year.  His note was reviewed.  She is up-to-date on her female checks but is looking for a new GYN provider.  She has a history of glaucoma and her vision has been compromised as a result of this illness.  History Melissa Garza has a past medical history of Anxiety, Cancer ([place]), Cataracts, bilateral (2018), Depression, Environmental allergies (2007), GERD (gastroesophageal reflux disease), Glaucoma, Headache, Hepatitis, Hot flashes, Personal history of chemotherapy, Personal history of radiation therapy, PONV (postoperative nausea and vomiting), S/P radiation therapy (03/01/2015 through 06/03/201604/26/2016 through 04/08/2015  ), and Shortness of breath.   She has a past surgical history that includes Tonsilectomy/adenoidectomy with myringotomy; Breast enhancement surgery (2008); Eye surgery; Colonoscopy; Breast surgery; Dilation and curettage of uterus; Tubal ligation; Portacath placement (N/A, 08/13/2014); Port-a-cath removal (N/A, 02/01/2015); Augmentation mammaplasty; and Breast lumpectomy (Right, 2016).   Her family history includes Asthma in her other; Hypertension in  her father; Psoriasis in her mother; Thyroid disease in her other.She reports that  has never smoked. she has never used smokeless tobacco. She reports that she does not drink alcohol or use drugs.  Outpatient Medications Prior to Visit  Medication Sig Dispense Refill  . acetaminophen (TYLENOL) 500 MG tablet Take 1,000 mg by mouth every 6 (six) hours as needed for mild pain.    Marland Kitchen ALPRAZolam (XANAX) 1 MG tablet Take 1 mg by mouth 4 (four) times daily as needed for anxiety.    Marland Kitchen buPROPion (WELLBUTRIN XL) 150 MG 24 hr tablet Take 150 mg by mouth daily.    . clobetasol cream (TEMOVATE) 3.66 % Apply 1 application topically 2 (two) times daily.    . Cyanocobalamin (VITAMIN B 12 PO) Take by mouth daily.    Marland Kitchen FLUoxetine (PROZAC) 40 MG capsule Take 40 mg by mouth daily.    Marland Kitchen gabapentin (NEURONTIN) 300 MG capsule Take 1 capsule (300 mg total) by mouth at bedtime. 90 capsule 0  . hydrocortisone 2.5 % lotion Apply topically 2 (two) times daily.    Lowanda Foster ([place] OP) Apply to eye.    . methylphenidate 54 MG PO CR tablet Take 54 mg by mouth every morning.    . Polyvinyl Alcohol-Povidone (REFRESH OP) Place 2 drops into both eyes daily as needed (ALLERGIES).    Marland Kitchen travoprost, benzalkonium, (TRAVATAN) 0.004 % ophthalmic solution Place 1 drop into both eyes at bedtime.     . traZODone (DESYREL) 50 MG tablet Take 50 mg by mouth at bedtime as needed for sleep.    Marland Kitchen zolpidem (AMBIEN) 10 MG tablet Take 10 mg by mouth at bedtime as needed for sleep.    Marland Kitchen PARoxetine (PAXIL) 20 MG tablet Take 20 mg by mouth daily.     Facility-Administered Medications  Prior to Visit  Medication Dose Route Frequency Provider Last Rate Last Dose  . 0.9 %  sodium chloride infusion  500 mL Intravenous Continuous Nandigam, Kavitha V, MD        ROS Review of Systems  Constitutional: Positive for fatigue. Negative for chills, diaphoresis and fever.  HENT: Positive for congestion, postnasal drip and sneezing. Negative for  rhinorrhea, sinus pressure, sinus pain, sore throat and trouble swallowing.   Eyes: Negative.   Respiratory: Negative for cough, chest tightness and wheezing.   Cardiovascular: Negative for chest pain.  Gastrointestinal: Positive for abdominal pain and constipation. Negative for anal bleeding, blood in stool, diarrhea, nausea, rectal pain and vomiting.  Genitourinary: Negative for dysuria and hematuria.  Musculoskeletal: Negative for arthralgias and myalgias.  Skin: Negative for pallor and rash.  Allergic/Immunologic: Negative for immunocompromised state.  Neurological: Positive for weakness and headaches. Negative for numbness.  Hematological: Does not bruise/bleed easily.  Psychiatric/Behavioral: Negative.     Objective:  BP 126/80 (BP Location: Left Arm, Patient Position: Sitting, Cuff Size: Normal)   Pulse (!) 102   Ht 5\' 1"  (1.549 m)   Wt 159 lb (72.1 kg)   SpO2 98%   BMI 30.04 kg/m   Physical Exam  Constitutional: She is oriented to person, place, and time. She appears well-developed and well-nourished. No distress.  HENT:  Head: Normocephalic and atraumatic.  Right Ear: External ear normal.  Left Ear: External ear normal.  Nose: Nose normal.  Mouth/Throat: Oropharynx is clear and moist. No oropharyngeal exudate.  Eyes: Conjunctivae and EOM are normal. Pupils are equal, round, and reactive to light. Right eye exhibits no discharge. Left eye exhibits no discharge. No scleral icterus.  Neck: Normal range of motion. Neck supple. No JVD present. No tracheal deviation present. No thyromegaly present.  Cardiovascular: Normal rate, regular rhythm and normal heart sounds.  Pulmonary/Chest: Effort normal and breath sounds normal. No stridor.  Abdominal: Soft. Bowel sounds are normal. She exhibits no distension. There is no hepatosplenomegaly, splenomegaly or hepatomegaly. There is no tenderness. There is no rebound, no guarding and no CVA tenderness.  Lymphadenopathy:    She has no  cervical adenopathy.  Neurological: She is alert and oriented to person, place, and time.  Skin: Skin is warm and dry. No rash noted. She is not diaphoretic. No erythema. No pallor.  Psychiatric: She has a normal mood and affect. Her behavior is normal.      Assessment & Plan:   Dewayne was seen today for establish care.  Diagnoses and all orders for this visit:  Viral syndrome -     CBC -     Urinalysis, Routine w reflex microscopic  Other constipation -     CBC -     TSH -     docusate sodium (COLACE) 100 MG capsule; Take 1 capsule (100 mg total) by mouth 2 (two) times daily.  Healthcare maintenance -     CBC -     Comprehensive metabolic panel -     Hepatitis C antibody -     HIV antibody -     Lipid panel -     TSH -     Urinalysis, Routine w reflex microscopic -     VITAMIN D 25 Hydroxy (Vit-D Deficiency, Fractures)  Vitamin D deficiency -     Vitamin D, Ergocalciferol, (DRISDOL) 50000 units CAPS capsule; Take 1 capsule (50,000 Units total) by mouth every 7 (seven) days.  Elevated LDL cholesterol level  Other orders -  Urine Culture -     MICROSCOPIC MESSAGE   I have discontinued [employer] [facility]. Kreager's PARoxetine. I am also having her start on docusate sodium and Vitamin D (Ergocalciferol). Additionally, I am having her maintain her FLUoxetine, zolpidem, travoprost (benzalkonium), methylphenidate, ALPRAZolam, traZODone, Polyvinyl Alcohol-Povidone (REFRESH OP), acetaminophen, Lifitegrast (XIIDRA OP), hydrocortisone, Cyanocobalamin (VITAMIN B 12 PO), gabapentin, buPROPion, and clobetasol cream. We will continue to administer sodium chloride.   Patient will use prescribed Colace for constipation.  She will continue her over-the-counter decongestant with tylenol. She can add advil and fu in 1 next week if not better.   Meds ordered this encounter  Medications  . docusate sodium (COLACE) 100 MG capsule    Sig: Take 1 capsule (100 mg total) by mouth 2 (two) times  daily.    Dispense:  10 capsule    Refill:  0  . Vitamin D, Ergocalciferol, (DRISDOL) 50000 units CAPS capsule    Sig: Take 1 capsule (50,000 Units total) by mouth every 7 (seven) days.    Dispense:  12 capsule    Refill:  1     Follow-up: Return in about 1 week (around 11/27/2017), or if symptoms worsen or fail to improve.  Libby Maw, MD

## 2017-11-21 DIAGNOSIS — E7800 Pure hypercholesterolemia, unspecified: Secondary | ICD-10-CM | POA: Insufficient documentation

## 2017-11-21 DIAGNOSIS — E559 Vitamin D deficiency, unspecified: Secondary | ICD-10-CM | POA: Insufficient documentation

## 2017-11-21 LAB — URINALYSIS, ROUTINE W REFLEX MICROSCOPIC
BACTERIA UA: NONE SEEN /HPF
BILIRUBIN URINE: NEGATIVE
Glucose, UA: NEGATIVE
HGB URINE DIPSTICK: NEGATIVE
NITRITE: NEGATIVE
Protein, ur: NEGATIVE
RBC / HPF: NONE SEEN /HPF (ref 0–2)
SPECIFIC GRAVITY, URINE: 1.033 (ref 1.001–1.03)

## 2017-11-21 LAB — CBC
HEMATOCRIT: 40.6 % (ref 35.0–45.0)
HEMOGLOBIN: 13.4 g/dL (ref 11.7–15.5)
MCH: 32.3 pg (ref 27.0–33.0)
MCHC: 33 g/dL (ref 32.0–36.0)
MCV: 97.8 fL (ref 80.0–100.0)
MPV: 10.5 fL (ref 7.5–12.5)
Platelets: 315 10*3/uL (ref 140–400)
RBC: 4.15 10*6/uL (ref 3.80–5.10)
RDW: 12.4 % (ref 11.0–15.0)
WBC: 10.7 10*3/uL (ref 3.8–10.8)

## 2017-11-21 LAB — URINE CULTURE
MICRO NUMBER: 90066381
SPECIMEN QUALITY:: ADEQUATE

## 2017-11-21 LAB — COMPREHENSIVE METABOLIC PANEL
AG Ratio: 1.4 (calc) (ref 1.0–2.5)
ALT: 10 U/L (ref 6–29)
AST: 12 U/L (ref 10–35)
Albumin: 4.1 g/dL (ref 3.6–5.1)
Alkaline phosphatase (APISO): 106 U/L (ref 33–130)
BUN / CREAT RATIO: 18 (calc) (ref 6–22)
BUN: 20 mg/dL (ref 7–25)
CO2: 27 mmol/L (ref 20–32)
CREATININE: 1.1 mg/dL — AB (ref 0.50–0.99)
Calcium: 9.7 mg/dL (ref 8.6–10.4)
Chloride: 103 mmol/L (ref 98–110)
GLUCOSE: 107 mg/dL — AB (ref 65–99)
Globulin: 2.9 g/dL (calc) (ref 1.9–3.7)
Potassium: 4.8 mmol/L (ref 3.5–5.3)
SODIUM: 144 mmol/L (ref 135–146)
TOTAL PROTEIN: 7 g/dL (ref 6.1–8.1)
Total Bilirubin: 0.2 mg/dL (ref 0.2–1.2)

## 2017-11-21 LAB — LIPID PANEL
CHOL/HDL RATIO: 5.4 (calc) — AB (ref <5.0)
Cholesterol: 265 mg/dL — ABNORMAL HIGH (ref <200)
HDL: 49 mg/dL — ABNORMAL LOW (ref >50)
LDL CHOLESTEROL (CALC): 182 mg/dL — AB
NON-HDL CHOLESTEROL (CALC): 216 mg/dL — AB (ref <130)
TRIGLYCERIDES: 188 mg/dL — AB (ref <150)

## 2017-11-21 LAB — HIV ANTIBODY (ROUTINE TESTING W REFLEX): HIV 1&2 Ab, 4th Generation: NONREACTIVE

## 2017-11-21 LAB — HEPATITIS C ANTIBODY
Hepatitis C Ab: NONREACTIVE
SIGNAL TO CUT-OFF: 0.02 (ref <1.00)

## 2017-11-21 LAB — TSH: TSH: 0.84 mIU/L (ref 0.40–4.50)

## 2017-11-21 LAB — VITAMIN D 25 HYDROXY (VIT D DEFICIENCY, FRACTURES): VIT D 25 HYDROXY: 20 ng/mL — AB (ref 30–100)

## 2017-11-21 MED ORDER — VITAMIN D (ERGOCALCIFEROL) 1.25 MG (50000 UNIT) PO CAPS: 50000.0000 [IU] | ORAL_CAPSULE | ORAL | 1 refills | Status: DC

## 2017-11-21 NOTE — Addendum Note (Signed)
Addended by: Jon Billings on: 11/21/2017 08:53 AM   Modules accepted: Orders

## 2017-12-05 ENCOUNTER — Encounter: Payer: Self-pay | Admitting: Family Medicine

## 2017-12-09 DIAGNOSIS — H401213 Low-tension glaucoma, right eye, severe stage: Secondary | ICD-10-CM | POA: Diagnosis not present

## 2018-02-23 ENCOUNTER — Encounter: Payer: Self-pay | Admitting: Family Medicine

## 2018-02-24 ENCOUNTER — Other Ambulatory Visit: Payer: Self-pay

## 2018-02-24 DIAGNOSIS — E559 Vitamin D deficiency, unspecified: Secondary | ICD-10-CM

## 2018-02-24 MED ORDER — VITAMIN D (ERGOCALCIFEROL) 1.25 MG (50000 UNIT) PO CAPS: 50000.0000 [IU] | ORAL_CAPSULE | ORAL | 0 refills | Status: DC

## 2018-03-14 DIAGNOSIS — H401213 Low-tension glaucoma, right eye, severe stage: Secondary | ICD-10-CM | POA: Diagnosis not present

## 2018-03-30 ENCOUNTER — Encounter: Payer: Self-pay | Admitting: Family Medicine

## 2018-03-31 ENCOUNTER — Encounter: Payer: Self-pay | Admitting: Family Medicine

## 2018-04-01 ENCOUNTER — Encounter: Payer: Self-pay | Admitting: Family Medicine

## 2018-04-03 ENCOUNTER — Encounter: Payer: Self-pay | Admitting: Family Medicine

## 2018-04-03 ENCOUNTER — Ambulatory Visit (INDEPENDENT_AMBULATORY_CARE_PROVIDER_SITE_OTHER): Payer: 59 | Admitting: Family Medicine

## 2018-04-03 VITALS — BP 150/90 | HR 97 | Ht 61.0 in | Wt 171.5 lb

## 2018-04-03 DIAGNOSIS — E7800 Pure hypercholesterolemia, unspecified: Secondary | ICD-10-CM

## 2018-04-03 DIAGNOSIS — M79671 Pain in right foot: Secondary | ICD-10-CM

## 2018-04-03 DIAGNOSIS — M79672 Pain in left foot: Secondary | ICD-10-CM | POA: Diagnosis not present

## 2018-04-03 MED ORDER — MELOXICAM 15 MG PO TABS: ORAL_TABLET | ORAL | 1 refills | Status: DC

## 2018-04-03 NOTE — Patient Instructions (Signed)
Fat and Cholesterol Restricted Diet Getting too much fat and cholesterol in your diet may cause health problems. Following this diet helps keep your fat and cholesterol at normal levels. This can keep you from getting sick. What types of fat should I choose?  Choose monosaturated and polyunsaturated fats. These are found in foods such as olive oil, canola oil, flaxseeds, walnuts, almonds, and seeds.  Eat more omega-3 fats. Good choices include salmon, mackerel, sardines, tuna, flaxseed oil, and ground flaxseeds.  Limit saturated fats. These are in animal products such as meats, butter, and cream. They can also be in plant products such as palm oil, palm kernel oil, and coconut oil.  Avoid foods with partially hydrogenated oils in them. These contain trans fats. Examples of foods that have trans fats are stick margarine, some tub margarines, cookies, crackers, and other baked goods. What general guidelines do I need to follow?  Check food labels. Look for the words "trans fat" and "saturated fat."  When preparing a meal: ? Fill half of your plate with vegetables and green salads. ? Fill one fourth of your plate with whole grains. Look for the word "whole" as the first word in the ingredient list. ? Fill one fourth of your plate with lean protein foods.  Eat more foods that have fiber, like apples, carrots, beans, peas, and barley.  Eat more home-cooked foods. Eat less at restaurants and buffets.  Limit or avoid alcohol.  Limit foods high in starch and sugar.  Limit fried foods.  Cook foods without frying them. Baking, boiling, grilling, and broiling are all great options.  Lose weight if you are overweight. Losing even a small amount of weight can help your overall health. It can also help prevent diseases such as diabetes and heart disease. What foods can I eat? Grains Whole grains, such as whole wheat or whole grain breads, crackers, cereals, and pasta. Unsweetened oatmeal,  bulgur, barley, quinoa, or brown rice. Corn or whole wheat flour tortillas. Vegetables Fresh or frozen vegetables (raw, steamed, roasted, or grilled). Green salads. Fruits All fresh, canned (in natural juice), or frozen fruits. Meat and Other Protein Products Ground beef (85% or leaner), grass-fed beef, or beef trimmed of fat. Skinless chicken or turkey. Ground chicken or turkey. Pork trimmed of fat. All fish and seafood. Eggs. Dried beans, peas, or lentils. Unsalted nuts or seeds. Unsalted canned or dry beans. Dairy Low-fat dairy products, such as skim or 1% milk, 2% or reduced-fat cheeses, low-fat ricotta or cottage cheese, or plain low-fat yogurt. Fats and Oils Tub margarines without trans fats. Light or reduced-fat mayonnaise and salad dressings. Avocado. Olive, canola, sesame, or safflower oils. Natural peanut or almond butter (choose ones without added sugar and oil). The items listed above may not be a complete list of recommended foods or beverages. Contact your dietitian for more options. What foods are not recommended? Grains White bread. White pasta. White rice. Cornbread. Bagels, pastries, and croissants. Crackers that contain trans fat. Vegetables White potatoes. Corn. Creamed or fried vegetables. Vegetables in a cheese sauce. Fruits Dried fruits. Canned fruit in light or heavy syrup. Fruit juice. Meat and Other Protein Products Fatty cuts of meat. Ribs, chicken wings, bacon, sausage, bologna, salami, chitterlings, fatback, hot dogs, bratwurst, and packaged luncheon meats. Liver and organ meats. Dairy Whole or 2% milk, cream, half-and-half, and cream cheese. Whole milk cheeses. Whole-fat or sweetened yogurt. Full-fat cheeses. Nondairy creamers and whipped toppings. Processed cheese, cheese spreads, or cheese curds. Sweets and Desserts Corn   syrup, sugars, honey, and molasses. Candy. Jam and jelly. Syrup. Sweetened cereals. Cookies, pies, cakes, donuts, muffins, and ice  cream. Fats and Oils Butter, stick margarine, lard, shortening, ghee, or bacon fat. Coconut, palm kernel, or palm oils. Beverages Alcohol. Sweetened drinks (such as sodas, lemonade, and fruit drinks or punches). The items listed above may not be a complete list of foods and beverages to avoid. Contact your dietitian for more information. This information is not intended to replace advice given to you by your health care provider. Make sure you discuss any questions you have with your health care provider. Document Released: 04/22/2012 Document Revised: 06/28/2016 Document Reviewed: 01/21/2014 Elsevier Interactive Patient Education  2018 [place], Adult Foot care is an important part of your health. Noticing and addressing any potential problems early is the best way to prevent future foot problems. How to care for your [place] your feet daily with warm water and mild soap. Do not use hot water. Then, pat your feet and the areas between your toes until they are completely dry. Do not soak your feet as this can dry your skin.  Trim your toenails straight across. Do not dig under them or around the cuticle. File the edges of your nails with an emery board or nail file.  On the skin on your feet and on dry, brittle nails, apply a moisturizing lotion or petroleum jelly that is unscented and does not contain alcohol. Do not apply lotion between your toes. Shoes and Socks   Wear clean socks or stockings every day. Make sure they are not too tight.  Wear shoes that fit properly and have enough cushioning. To break in new shoes, wear them for just a few hours a day. This prevents you from injuring your feet. Always look in your shoes before you put them on to be sure there are no objects inside. Wounds, Scrapes, Corns, and Calluses  Check your feet daily for blisters, cuts, and redness. If you cannot see the bottom of your feet, use a mirror or ask someone for  help.  Do not cut corns or calluses. Do not try to remove them with medicine.  If you find a minor scrape, cut, or break in the skin on your feet, keep it and the skin around it clean and dry. These areas may be cleaned with mild soap and water. Do not clean the area with peroxide, alcohol, or iodine.  If you have a wound, scrape, corn, or callus on your foot, look at it several times a day to make sure it is healing and is not infected. Check for: ? More redness, swelling, or pain. ? More fluid or blood. ? Warmth. ? Pus or a bad smell. General Instructions  Do not cross your legs. That may decrease the blood flow to your feet.  Do not use heating pads or hot water bottles on your feet. They may burn your skin. If you have lost feeling in your feet or legs, you may not know it is happening until it is too late.  Make sure your health care provider does a complete foot exam at least annually or more often if you have foot problems. If you have foot problems, report any cuts, sores, or bruises to your health care provider immediately. Contact a health care provider if:  You have a medical condition that increases your risk of infection and you have any cuts, sores, or bruises on your feet.  You have an injury that is not healing.  You notice redness on your legs or feet.  You feel burning or tingling in your legs or feet.  You have pain or cramps in your legs or feet.  Your legs or feet are numb.  Your feet always feel cold.  You have pain around a toenail. Get help right away if:  You have a wound, scrape, corn, or callus on your foot and: ? You have more redness, swelling, or pain. ? You have more fluid or blood. ? Your wound, scrape, corn, or callus feels warm to the touch. ? You have pus or a bad smell coming from the wound, scrape, corn, or callus. ? You have a fever.  You have a red line going up your leg. This information is not intended to replace advice given to  you by your health care provider. Make sure you discuss any questions you have with your health care provider. Document Released: 03/30/2016 Document Revised: 06/12/2016 Document Reviewed: 03/30/2016 Elsevier Interactive Patient Education  [employer].

## 2018-04-03 NOTE — Progress Notes (Signed)
Subjective:  Patient ID: Melissa Garza, female    DOB: 04/24/1956  Age: 62 y.o. MRN: [id number]  CC: bilateral foot pain   HPI Melissa Garza presents for evaluation of bilateral foot pain.  The last year is so she is noted pain in both of her feet.  There has not been a specific injury.  There has been some weight gain.  People have left her job and she has been responsible for more work.  She stands and walks throughout the day at work and only is able to sit for breaks.  She has intermittent right lower back pain that is nonradiating.  There is been no weakness paresthesias or problems with her bowel or bladder function.  Her feet just seem to ache all at all the time.  On the right foot there is a vibration type feeling along the medial heel.  On her left foot she does have 3 plantar warts that are not tender and do not seem to bother along her heel.  The balls of her feet bother her more on the left foot but not on a consistent basis.  There is some pain in her feet after rest but it is not particularly pronounced.  She does have new shoes that she had heard work good but they have not seem to help much.  Her shoes are less than 2 months old.  She has been taking taking her vitamin D.  Outpatient Medications Prior to Visit  Medication Sig Dispense Refill  . ALPRAZolam (XANAX) 1 MG tablet Take 1 mg by mouth 4 (four) times daily as needed for anxiety.    . brimonidine-timolol (COMBIGAN) 0.2-0.5 % ophthalmic solution Place 1 drop into the right eye 2 (two) times daily.    . clobetasol cream (TEMOVATE) 6.81 % Apply 1 application topically 2 (two) times daily.    . Cyanocobalamin (VITAMIN B 12 PO) Take by mouth daily.    Marland Kitchen dexmethylphenidate (FOCALIN) 10 MG tablet Take 10 mg by mouth 2 (two) times daily.    Marland Kitchen FLUoxetine (PROZAC) 40 MG capsule Take 40 mg by mouth daily.    . hydrocortisone 2.5 % lotion Apply topically 2 (two) times daily.    . Polyvinyl Alcohol-Povidone (REFRESH OP)  Place 2 drops into both eyes daily as needed (ALLERGIES).    Marland Kitchen travoprost, benzalkonium, (TRAVATAN) 0.004 % ophthalmic solution Place 1 drop into both eyes at bedtime.     . Vitamin D, Ergocalciferol, (DRISDOL) 50000 units CAPS capsule Take 1 capsule (50,000 Units total) by mouth every 7 (seven) days. 12 capsule 0  . zolpidem (AMBIEN) 10 MG tablet Take 10 mg by mouth at bedtime as needed for sleep.    Marland Kitchen acetaminophen (TYLENOL) 500 MG tablet Take 1,000 mg by mouth every 6 (six) hours as needed for mild pain.    Marland Kitchen buPROPion (WELLBUTRIN XL) 150 MG 24 hr tablet Take 150 mg by mouth daily.    Marland Kitchen docusate sodium (COLACE) 100 MG capsule Take 1 capsule (100 mg total) by mouth 2 (two) times daily. 10 capsule 0  . gabapentin (NEURONTIN) 300 MG capsule Take 1 capsule (300 mg total) by mouth at bedtime. 90 capsule 0  . Lifitegrast (XIIDRA OP) Apply to eye.    . methylphenidate 54 MG PO CR tablet Take 54 mg by mouth every morning.    . traZODone (DESYREL) 50 MG tablet Take 50 mg by mouth at bedtime as needed for sleep.    Marland Kitchen 0.9 %  sodium chloride infusion      No facility-administered medications prior to visit.     ROS Review of Systems  Constitutional: Negative.   Gastrointestinal: Negative.   Genitourinary: Negative.   Musculoskeletal: Positive for arthralgias and gait problem. Negative for joint swelling and myalgias.  Neurological: Negative for weakness and numbness.  Hematological: Negative.   Psychiatric/Behavioral: Negative.     Objective:  BP (!) 150/90   Pulse 97   Ht 5\' 1"  (1.549 m)   Wt 171 lb 8 oz (77.8 kg)   SpO2 95%   BMI 32.40 kg/m   BP Readings from Last 3 Encounters:  04/03/18 (!) 150/90  11/20/17 126/80  08/22/17 127/83    Wt Readings from Last 3 Encounters:  04/03/18 171 lb 8 oz (77.8 kg)  11/20/17 159 lb (72.1 kg)  08/22/17 164 lb 12.8 oz (74.8 kg)    Physical Exam  Constitutional: She is oriented to person, place, and time.  Cardiovascular: Intact distal  pulses.  Pulses:      Dorsalis pedis pulses are 2+ on the right side, and 2+ on the left side.       Posterior tibial pulses are 2+ on the right side, and 2+ on the left side.  Musculoskeletal:       Left foot: There is normal range of motion, no tenderness, no bony tenderness, no swelling and normal capillary refill.       Feet:  Neurological: She is alert and oriented to person, place, and time.  Skin: Skin is warm and dry. Capillary refill takes less than 2 seconds.  Psychiatric: She has a normal mood and affect. Her behavior is normal.    Lab Results  Component Value Date   WBC 10.7 11/20/2017   HGB 13.4 11/20/2017   HCT 40.6 11/20/2017   PLT 315 11/20/2017   GLUCOSE 107 (H) 11/20/2017   CHOL 265 (H) 11/20/2017   TRIG 188 (H) 11/20/2017   HDL 49 (L) 11/20/2017   LDLCALC 182 (H) 11/20/2017   ALT 10 11/20/2017   AST 12 11/20/2017   NA 144 11/20/2017   K 4.8 11/20/2017   CL 103 11/20/2017   CREATININE 1.10 (H) 11/20/2017   BUN 20 11/20/2017   CO2 27 11/20/2017   TSH 0.84 11/20/2017    Mm Diag Breast W/implant Tomo Bilateral  Result Date: 11/06/2017 CLINICAL DATA:  Annual examination. History of right breast cancer, status post lumpectomy in 2016. EXAM: 2D DIGITAL DIAGNOSTIC BILATERAL MAMMOGRAM WITH IMPLANTS, CAD AND ADJUNCT TOMO The patient has retropectoral implants. Standard and implant displaced views were performed. COMPARISON:  Previous exam(s). ACR Breast Density Category b: There are scattered areas of fibroglandular density. FINDINGS: Lumpectomy changes with surgical clips are present in the deep upper central right breast. No mass, nonsurgical distortion, or suspicious microcalcification is identified in either breast to suggest malignancy. Mammographic images were processed with CAD. IMPRESSION: No evidence of malignancy bilaterally. Lumpectomy changes on the right. RECOMMENDATION: Diagnostic mammogram is suggested in 1 year. (Code:DM-B-01Y) I have discussed the  findings and recommendations with the patient. Results were also provided in writing at the conclusion of the visit. If applicable, a reminder letter will be sent to the patient regarding the next appointment. BI-RADS CATEGORY  2: Benign. Electronically Signed   By: Curlene Dolphin M.D.   On: 11/06/2017 16:14    Assessment & Plan:   Melissa Garza was seen today for bilateral foot pain.  Diagnoses and all orders for this visit:  Elevated LDL cholesterol  level  Foot pain, bilateral -     Ambulatory referral to Sports Medicine -     meloxicam (MOBIC) 15 MG tablet; Take one tablet by mouth daily with food for 2 weeks and then as needed.   I have discontinued Shawntay Prest. Gander's methylphenidate, traZODone, acetaminophen, Lifitegrast (XIIDRA OP), gabapentin, buPROPion, and docusate sodium. I am also having her start on meloxicam. Additionally, I am having her maintain her FLUoxetine, zolpidem, travoprost (benzalkonium), ALPRAZolam, Polyvinyl Alcohol-Povidone (REFRESH OP), hydrocortisone, Cyanocobalamin (VITAMIN B 12 PO), clobetasol cream, Vitamin D (Ergocalciferol), dexmethylphenidate, and brimonidine-timolol. We will stop administering sodium chloride.  Meds ordered this encounter  Medications  . meloxicam (MOBIC) 15 MG tablet    Sig: Take one tablet by mouth daily with food for 2 weeks and then as needed.    Dispense:  30 tablet    Refill:  1   Believe that the patient has a sore aching free from increased standing and walking.  Believe that she would benefit from inserts possibly.  She will take Mobic for 2 weeks and then back off to as needed.  We discussed her elevated LDL cholesterol.  I am giving her a trial of diet therapy and we will recheck her LDL in 3 months.  Follow-up: Return in about 3 months (around 07/04/2018), or if symptoms worsen or fail to improve.  Libby Maw, MD

## 2018-04-14 ENCOUNTER — Encounter: Payer: Self-pay | Admitting: Family Medicine

## 2018-04-15 ENCOUNTER — Ambulatory Visit: Payer: 59 | Admitting: Family Medicine

## 2018-04-16 ENCOUNTER — Telehealth: Payer: Self-pay

## 2018-04-16 NOTE — Telephone Encounter (Signed)
Okay for transfer to Dr. Birdie Riddle?   Copied from [place] [phone]. Topic: Quick Communication - See Telephone Encounter >> Apr 16, 2018 11:32 AM Antonieta Iba C wrote: CRM for notification. See Telephone encounter for: 04/16/18.  Mr Lheureux pt's spouse is calling in to switch pt  from Dr. Ethelene Hal to Dr. Birdie Riddle. Mr Westrich is a current pt of [facility]. Please assist further.  [phone]

## 2018-04-17 NOTE — Telephone Encounter (Signed)
As she wishes.

## 2018-04-20 NOTE — Telephone Encounter (Signed)
Ok to switch. Thanks

## 2018-04-21 NOTE — Telephone Encounter (Signed)
Called pt unable to LM due to mailbox full.

## 2018-04-23 DIAGNOSIS — G5762 Lesion of plantar nerve, left lower limb: Secondary | ICD-10-CM | POA: Diagnosis not present

## 2018-04-23 DIAGNOSIS — G5761 Lesion of plantar nerve, right lower limb: Secondary | ICD-10-CM | POA: Diagnosis not present

## 2018-04-23 DIAGNOSIS — M722 Plantar fascial fibromatosis: Secondary | ICD-10-CM | POA: Diagnosis not present

## 2018-04-23 DIAGNOSIS — M7731 Calcaneal spur, right foot: Secondary | ICD-10-CM | POA: Diagnosis not present

## 2018-05-05 DIAGNOSIS — G5761 Lesion of plantar nerve, right lower limb: Secondary | ICD-10-CM | POA: Diagnosis not present

## 2018-05-05 DIAGNOSIS — M722 Plantar fascial fibromatosis: Secondary | ICD-10-CM | POA: Diagnosis not present

## 2018-05-12 DIAGNOSIS — M71571 Other bursitis, not elsewhere classified, right ankle and foot: Secondary | ICD-10-CM | POA: Diagnosis not present

## 2018-05-12 DIAGNOSIS — G5763 Lesion of plantar nerve, bilateral lower limbs: Secondary | ICD-10-CM | POA: Diagnosis not present

## 2018-05-12 DIAGNOSIS — M722 Plantar fascial fibromatosis: Secondary | ICD-10-CM | POA: Diagnosis not present

## 2018-05-19 DIAGNOSIS — M659 Synovitis and tenosynovitis, unspecified: Secondary | ICD-10-CM | POA: Diagnosis not present

## 2018-05-19 DIAGNOSIS — M722 Plantar fascial fibromatosis: Secondary | ICD-10-CM | POA: Diagnosis not present

## 2018-05-19 DIAGNOSIS — G5763 Lesion of plantar nerve, bilateral lower limbs: Secondary | ICD-10-CM | POA: Diagnosis not present

## 2018-06-02 DIAGNOSIS — M722 Plantar fascial fibromatosis: Secondary | ICD-10-CM | POA: Diagnosis not present

## 2018-06-02 DIAGNOSIS — M71571 Other bursitis, not elsewhere classified, right ankle and foot: Secondary | ICD-10-CM | POA: Diagnosis not present

## 2018-06-05 DIAGNOSIS — G5601 Carpal tunnel syndrome, right upper limb: Secondary | ICD-10-CM | POA: Diagnosis not present

## 2018-06-05 DIAGNOSIS — M5417 Radiculopathy, lumbosacral region: Secondary | ICD-10-CM | POA: Diagnosis not present

## 2018-06-05 DIAGNOSIS — R202 Paresthesia of skin: Secondary | ICD-10-CM | POA: Diagnosis not present

## 2018-06-05 DIAGNOSIS — M79671 Pain in right foot: Secondary | ICD-10-CM | POA: Diagnosis not present

## 2018-06-05 DIAGNOSIS — M5412 Radiculopathy, cervical region: Secondary | ICD-10-CM | POA: Diagnosis not present

## 2018-06-05 DIAGNOSIS — R201 Hypoesthesia of skin: Secondary | ICD-10-CM | POA: Diagnosis not present

## 2018-06-19 DIAGNOSIS — M542 Cervicalgia: Secondary | ICD-10-CM | POA: Diagnosis not present

## 2018-06-19 DIAGNOSIS — R201 Hypoesthesia of skin: Secondary | ICD-10-CM | POA: Diagnosis not present

## 2018-06-19 DIAGNOSIS — M5417 Radiculopathy, lumbosacral region: Secondary | ICD-10-CM | POA: Diagnosis not present

## 2018-06-19 DIAGNOSIS — R202 Paresthesia of skin: Secondary | ICD-10-CM | POA: Diagnosis not present

## 2018-07-04 ENCOUNTER — Ambulatory Visit: Payer: 59 | Admitting: Family Medicine

## 2018-07-24 ENCOUNTER — Ambulatory Visit: Payer: 59 | Admitting: Family Medicine

## 2018-07-30 ENCOUNTER — Ambulatory Visit: Payer: 59 | Admitting: Family Medicine

## 2018-08-04 ENCOUNTER — Other Ambulatory Visit: Payer: Self-pay

## 2018-08-04 ENCOUNTER — Other Ambulatory Visit: Payer: Self-pay | Admitting: General Practice

## 2018-08-04 ENCOUNTER — Encounter: Payer: Self-pay | Admitting: Family Medicine

## 2018-08-04 ENCOUNTER — Ambulatory Visit (INDEPENDENT_AMBULATORY_CARE_PROVIDER_SITE_OTHER): Payer: 59 | Admitting: Family Medicine

## 2018-08-04 VITALS — BP 143/83 | HR 96 | Temp 98.5°F | Resp 16 | Ht 61.0 in | Wt 179.1 lb

## 2018-08-04 DIAGNOSIS — E559 Vitamin D deficiency, unspecified: Secondary | ICD-10-CM

## 2018-08-04 DIAGNOSIS — L600 Ingrowing nail: Secondary | ICD-10-CM

## 2018-08-04 DIAGNOSIS — R519 Headache, unspecified: Secondary | ICD-10-CM

## 2018-08-04 DIAGNOSIS — E7800 Pure hypercholesterolemia, unspecified: Secondary | ICD-10-CM

## 2018-08-04 DIAGNOSIS — G8929 Other chronic pain: Secondary | ICD-10-CM

## 2018-08-04 DIAGNOSIS — E538 Deficiency of other specified B group vitamins: Secondary | ICD-10-CM

## 2018-08-04 DIAGNOSIS — R51 Headache: Secondary | ICD-10-CM

## 2018-08-04 DIAGNOSIS — F419 Anxiety disorder, unspecified: Secondary | ICD-10-CM | POA: Diagnosis not present

## 2018-08-04 DIAGNOSIS — B070 Plantar wart: Secondary | ICD-10-CM

## 2018-08-04 MED ORDER — CYCLOBENZAPRINE HCL 10 MG PO TABS: 10.0000 mg | ORAL_TABLET | Freq: Every day | ORAL | 0 refills | Status: DC

## 2018-08-04 NOTE — Assessment & Plan Note (Signed)
Pt has hx of this.  Check labs and replete prn. 

## 2018-08-04 NOTE — Patient Instructions (Addendum)
Schedule your complete physical in 6 months We'll notify you of your lab results and make any changes if needed We'll call you with your podiatry appt START the Cyclobenzaprine nightly for muscle spasm- will cause drowsiness Consider switching your pillow- this may be causing your neck pain/spasm If the headaches continue, please discuss w/ Dr Trula Ore Try and make healthy food choices and get regular exercise- any movement is better than no movement Call with any questions or concerns Welcome!  We're glad to have you!!!

## 2018-08-04 NOTE — Progress Notes (Signed)
   Subjective:    Patient ID: Melissa Garza, female    DOB: Oct 09, 1956, 62 y.o.   MRN: [id number]  HPI Pt is switching providers and here to establish care.  Anxiety- chronic problem, pt is on Prozac, Wellbutrin, Alprazolam and Ritalin per Dr Toy Care.  Pt feels sxs are 'pretty much' well controlled.  Hyperlipidemia- pt's LDL in Jan was 182.  Pt has gained 7 lbs since last visit.  Has never been on medication.  No CP, SOB, abd pain, N/V.  HAs- pt reports HAs since 'my 20s'.  Pt reports sxs have worsened over the last few months.  Had to miss work on Thursday and stayed in bed Saturday/Sunday.  HAs are L sided and occipital.  No nausea.  + family hx of migraines.  + photo and phonophobia.  This weekend took ES Tylenol, Tylenol Sinus, Ibuprofen.  'it just goes away when it goes away'.  Can improve w/ food or a hot shower.  No change in pillow or mattress.  Is waking w/ headaches.  Currently asymptomatic but does have neck pain.  Radiculopathy of neck and back- seeing Dr Trula Ore in [place] at [facility].  Currently on Gabapentin and receiving injxns.  L ingrown toenail- 'it hurts all the time', not oozing or draining  Plantar warts- L foot.  painful   Health maintenance- UTD on colonoscopy, mammo.  UTD on Tdap.  Will get flu shot at work.  Review of Systems For ROS see HPI     Objective:   Physical Exam  Constitutional: She is oriented to person, place, and time. She appears well-developed and well-nourished. No distress.  HENT:  Head: Normocephalic and atraumatic.  Eyes: Pupils are equal, round, and reactive to light. Conjunctivae and EOM are normal.  Neck: Normal range of motion. Neck supple. No thyromegaly present.  Bilateral trap spasm  Cardiovascular: Normal rate, regular rhythm, normal heart sounds and intact distal pulses.  No murmur heard. Pulmonary/Chest: Effort normal and breath sounds normal. No respiratory distress.  Abdominal: Soft. She exhibits no distension. There is  no tenderness.  Musculoskeletal: She exhibits no edema.  Lymphadenopathy:    She has no cervical adenopathy.  Neurological: She is alert and oriented to person, place, and time.  Skin: Skin is warm and dry.  Psychiatric: She has a normal mood and affect. Her behavior is normal.  Vitals reviewed.         Assessment & Plan:  Plantar wart/ingrown toenail- refer to derm

## 2018-08-04 NOTE — Assessment & Plan Note (Signed)
Pt has hx of this but this has worsened recently.  + trap spasms bilaterally, R>L which may be contributing to HA.  She is currently seeing neurology and has known cervical radiculopathy- which may also be contributing.  Start Flexeril and monitor for improvement.  If none, pt to discuss w/ neuro.  Pt expressed understanding and is in agreement w/ plan.

## 2018-08-04 NOTE — Assessment & Plan Note (Signed)
New to provider, ongoing for pt.  Severe.  Seeing Dr Toy Care.  On multiple medications.  Will follow along and assist as able.

## 2018-08-04 NOTE — Assessment & Plan Note (Signed)
Pt's LDL was well above goal at last check.  She was not started on meds and in the interim, has gained weight.  Repeat labs and start meds prn.

## 2018-08-05 LAB — BASIC METABOLIC PANEL
BUN: 20 mg/dL (ref 6–23)
CALCIUM: 9.2 mg/dL (ref 8.4–10.5)
CO2: 30 mEq/L (ref 19–32)
Chloride: 102 mEq/L (ref 96–112)
Creatinine, Ser: 1.03 mg/dL (ref 0.40–1.20)
GFR: 57.56 mL/min — AB (ref >60.00)
Glucose, Bld: 84 mg/dL (ref 70–99)
POTASSIUM: 3.6 meq/L (ref 3.5–5.1)
SODIUM: 139 meq/L (ref 135–145)

## 2018-08-05 LAB — CBC WITH DIFFERENTIAL/PLATELET
BASOS ABS: 0.1 10*3/uL (ref 0.0–0.1)
Basophils Relative: 1.3 % (ref 0.0–3.0)
EOS PCT: 1.1 % (ref 0.0–5.0)
Eosinophils Absolute: 0.1 10*3/uL (ref 0.0–0.7)
HEMATOCRIT: 38.3 % (ref 36.0–46.0)
Hemoglobin: 12.9 g/dL (ref 12.0–15.0)
LYMPHS ABS: 2.1 10*3/uL (ref 0.7–4.0)
LYMPHS PCT: 19.6 % (ref 12.0–46.0)
MCHC: 33.7 g/dL (ref 30.0–36.0)
MCV: 99.1 fl (ref 78.0–100.0)
MONOS PCT: 5.1 % (ref 3.0–12.0)
Monocytes Absolute: 0.6 10*3/uL (ref 0.1–1.0)
NEUTROS PCT: 72.9 % (ref 43.0–77.0)
Neutro Abs: 8 10*3/uL — ABNORMAL HIGH (ref 1.4–7.7)
Platelets: 299 10*3/uL (ref 150.0–400.0)
RBC: 3.86 Mil/uL — AB (ref 3.87–5.11)
RDW: 13.8 % (ref 11.5–15.5)
WBC: 10.9 10*3/uL — ABNORMAL HIGH (ref 4.0–10.5)

## 2018-08-05 LAB — HEPATIC FUNCTION PANEL
ALK PHOS: 85 U/L (ref 39–117)
ALT: 13 U/L (ref 0–35)
AST: 13 U/L (ref 0–37)
Albumin: 3.8 g/dL (ref 3.5–5.2)
BILIRUBIN TOTAL: 0.3 mg/dL (ref 0.2–1.2)
Bilirubin, Direct: 0 mg/dL (ref 0.0–0.3)
Total Protein: 6.3 g/dL (ref 6.0–8.3)

## 2018-08-05 LAB — LIPID PANEL
CHOL/HDL RATIO: 5
Cholesterol: 266 mg/dL — ABNORMAL HIGH (ref 0–200)
HDL: 56.1 mg/dL (ref >39.00)
LDL CALC: 176 mg/dL — AB (ref 0–99)
NonHDL: 210.33
Triglycerides: 174 mg/dL — ABNORMAL HIGH (ref 0.0–149.0)
VLDL: 34.8 mg/dL (ref 0.0–40.0)

## 2018-08-05 LAB — VITAMIN D 25 HYDROXY (VIT D DEFICIENCY, FRACTURES): VITD: 23.16 ng/mL — ABNORMAL LOW (ref 30.00–100.00)

## 2018-08-05 LAB — VITAMIN B12: Vitamin B-12: 572 pg/mL (ref 211–911)

## 2018-08-05 LAB — TSH: TSH: 1.62 u[IU]/mL (ref 0.35–4.50)

## 2018-08-06 ENCOUNTER — Other Ambulatory Visit: Payer: Self-pay | Admitting: General Practice

## 2018-08-06 DIAGNOSIS — E785 Hyperlipidemia, unspecified: Secondary | ICD-10-CM

## 2018-08-06 MED ORDER — ROSUVASTATIN CALCIUM 10 MG PO TABS: 10.0000 mg | ORAL_TABLET | Freq: Every day | ORAL | 6 refills | Status: DC

## 2018-08-06 MED ORDER — VITAMIN D (ERGOCALCIFEROL) 1.25 MG (50000 UNIT) PO CAPS: 50000.0000 [IU] | ORAL_CAPSULE | ORAL | 0 refills | Status: DC

## 2018-08-17 NOTE — Progress Notes (Signed)
[place]  [phone] [phone]) [phone]     ID: Melissa Garza DOB: [birth year]  MR#: [id number]  JYN#:[id number]  Patient Care Team: Midge Minium, MD as PCP - General (Family Medicine) Excell Seltzer, MD as Consulting Physician (General Surgery) Jaksen Fiorella, Virgie Dad, MD as Consulting Physician (Oncology) Arloa Koh, MD as Consulting Physician (Radiation Oncology) Sanjuana Kava, MD as Referring Physician (Obstetrics and Gynecology) Mauri Pole, MD as Consulting Physician (Gastroenterology) Boyd Kerbs, MD as Referring Physician (Specialist) OTHER MD: Chucky May MD  CHIEF COMPLAINT: Triple negative breast cancer  CURRENT TREATMENT: Observation  BREAST CANCER HISTORY: From the original intake note:  Melissa Garza herself palpated a mass in her right breast approximately mid-September. She brought this to her gynecologist attention and he set her up for bilateral diagnostic mammography at the breast center 08/04/2014. The breast density was category C. in the patient has bilateral saline implants in place. In the palpable area of concern in the right breast there was an irregular mass measuring up to 2.5 cm. By palpation this was firm at the 12:30 o'clock position. Ultrasound of the right breast confirmed an irregular hypoechoic mass in this area measuring 2.5 cm. There was no right axillary lymphadenopathy noted.  Biopsy of the mass in question 08/04/2014 showed ([id number]) invasive ductal carcinoma, grade 3, triple negative, with an MIB-1 of 90%.  Bilateral breast MRI 08/09/2014 showed in the upper inner quadrant of the right breast an irregular enhancing mass measuring 2.8 cm. There was a 4 mm satellite nodule anterior to this and separated from that by 0.6 cm. The left breast, the remaining of the right breast and the lymph node areas were negative.  The patient's subsequent history is as detailed below  INTERVAL HISTORY: Melissa Garza  returns today for follow-up of her estrogen receptor negative breast cancer. She is doing well overall.   Since her last visit to the office, she underwent diagnostic bilateral mammogram on 11/06/2017 that showed: Breast density category B. There is no evidence of malignancy bilaterally. Lumpectomy changes on the right.     REVIEW OF SYSTEMS:  Melissa Garza reports that she used to walk for exercise, however, her feet hurt very bad so she is unable to do as much as she used too. She was recently diagnosed with a pinched nerve to her feet and back also plantar fascitis. She was given injections by a podiatrist, without much help. She was referred to Neurologist, Dr. Aldona Lento with steroid injections. She follows up this week for more steroid injections that alleviated her symptoms. Her new PCP is Dr. Birdie Riddle. She was placed on Crestor for her high cholesterol. 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 She denies unusual headaches, visual changes, nausea, vomiting, or dizziness. There has been no unusual cough, phlegm production, or pleurisy. This been no change in bowel or bladder habits. She denies unexplained fatigue or unexplained weight loss, bleeding, rash, or fever. A detailed review of systems was otherwise stable.     PAST MEDICAL HISTORY: Past Medical History:  Diagnosis Date  . Anxiety   . Cancer [facility])    cancer of right breast  . Cataracts, bilateral 2018  . Depression   . Environmental allergies 2007  . GERD (gastroesophageal reflux disease)    PMH  . Glaucoma   . Headache    chronic  . Hepatitis    Hx: of Hepatitis not sure which one  . Hot flashes   . Personal  history of chemotherapy   . Personal history of radiation therapy   . PONV (postoperative nausea and vomiting)   . S/P radiation therapy 03/01/2015 through 06/03/201604/26/2016 through 04/08/2015     Right  breast 5040 cGy in 28 sessions (no boost)  . Shortness of breath    with exertion    PAST SURGICAL HISTORY: Past Surgical History:  Procedure Laterality Date  . AUGMENTATION MAMMAPLASTY    . BREAST ENHANCEMENT SURGERY  2008  . BREAST LUMPECTOMY Right 2016  . BREAST SURGERY     Biopsy  . COLONOSCOPY    . DILATION AND CURETTAGE OF UTERUS    . EYE SURGERY     Lasik  . PORT-A-CATH REMOVAL N/A 02/01/2015   Procedure: REMOVAL PORT-A-CATH;  Surgeon: Excell Seltzer, MD;  Location: [facility];  Service: General;  Laterality: N/A;  . PORTACATH PLACEMENT N/A 08/13/2014   Procedure: INSERTION PORT-A-CATH;  Surgeon: Excell Seltzer, MD;  Location: [facility];  Service: General;  Laterality: N/A;  . TONSILECTOMY/ADENOIDECTOMY WITH MYRINGOTOMY    . TUBAL LIGATION      FAMILY HISTORY Family History  Problem Relation Age of Onset  . Psoriasis Mother   . Pulmonary embolism Mother   . Hypertension Father   . Glaucoma Father   . Heart attack Father   . Healthy Sister   . Healthy Sister   . Thyroid disease Maternal Grandmother   . Asthma Paternal Grandmother   . Colon polyps Neg Hx   . Esophageal cancer Neg Hx   . Rectal cancer Neg Hx   . Stomach cancer Neg Hx    The patient's parents are currently alive, in their late 91s. The patient had no brothers, 2 sisters. There is no history of breast or ovarian cancer in the family to her knowledge.   GYNECOLOGIC HISTORY:  No LMP recorded. Patient is postmenopausal. Menarche age 61, first live birth age 50. She is GX P2. She stopped having periods in 2005 and started hormone replacement at that time. 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 Her husband, Melissa Garza, is disabled secondary to multiple cardiac problems. Son Melissa Garza lives in [place] and works in apartment maintenance. Son Melissa Garza  is his wife) works as an Clinical biochemist. The patient has 6 grandchildren. She is not a Ambulance person.   ADVANCED DIRECTIVES: Not in place   HEALTH MAINTENANCE: Social History   Tobacco Use  . Smoking status: Never Smoker  . Smokeless tobacco: Never Used  Substance Use Topics  . Alcohol use: No  . Drug use: No     Colonoscopy: 2008/ [facility]  [id number]  Bone density:Never  Lipid panel:  Allergies  Allergen Reactions  . Hydrocodone Hives and Itching    Current Outpatient Medications  Medication Sig Dispense Refill  . ALPRAZolam (XANAX) 1 MG tablet Take 1 mg by mouth 4 (four) times daily as needed for anxiety.    . brimonidine-timolol (COMBIGAN) 0.2-0.5 % ophthalmic solution Place 1 drop into the right eye 2 (two) times daily.    Marland Kitchen buPROPion (WELLBUTRIN XL) 150 MG 24 hr tablet Take 150 mg by mouth daily.    . clobetasol cream (TEMOVATE) 4.74 % Apply 1 application topically 2 (two) times daily.    . cyclobenzaprine (FLEXERIL) 10 MG tablet Take 1 tablet (10 mg total) by mouth at bedtime. 30 tablet 0  .  FLUoxetine (PROZAC) 40 MG capsule Take 40 mg by mouth daily.    Marland Kitchen gabapentin (NEURONTIN) 300 MG capsule Take 300 mg by mouth 3 (three) times daily.    . hydrocortisone 2.5 % lotion Apply topically 2 (two) times daily.    . Ibuprofen-Famotidine (DUEXIS) 800-26.6 MG TABS Take by mouth.    . methylphenidate (RITALIN) 20 MG tablet Take 20 mg by mouth 2 (two) times daily.    . rosuvastatin (CRESTOR) 10 MG tablet Take 1 tablet (10 mg total) by mouth daily. 30 tablet 6  . travoprost, benzalkonium, (TRAVATAN) 0.004 % ophthalmic solution Place 1 drop into both eyes at bedtime.     . Vitamin D, Ergocalciferol, (DRISDOL) 50000 units CAPS capsule Take 1 capsule (50,000 Units total) by mouth every 7 (seven) days. 12 capsule 0  . zolpidem (AMBIEN) 10 MG tablet Take 10 mg by mouth at bedtime as needed for sleep.     No current facility-administered medications for this visit.     OBJECTIVE:   Middle-aged white woman In no acute distress  Vitals:   08/18/18 1524  BP: 128/88  Pulse: 100  Resp: 18  Temp: 97.7 F (36.5 C)  SpO2: 99%     Body mass index is 34.58 kg/m.    ECOG FS:0 - Asymptomatic  Sclerae unicteric, EOMs intact Oropharynx clear and moist No cervical or supraclavicular adenopathy Lungs no rales or rhonchi Heart regular rate and rhythm Abd soft, nontender, positive bowel sounds MSK no focal spinal tenderness, no upper extremity lymphedema Neuro: nonfocal, well oriented, appropriate affect Breasts: The right breast is status post lumpectomy and radiation with no evidence of recurrence. The left breast is benign. Both axillae are benign.  LAB RESULTS:  CMP     Component Value Date/Time   NA 139 08/04/2018 1605   NA 140 08/21/2017 1544   K 3.6 08/04/2018 1605   K 4.1 08/21/2017 1544   CL 102 08/04/2018 1605   CO2 30 08/04/2018 1605   CO2 25 08/21/2017 1544   GLUCOSE 84 08/04/2018 1605   GLUCOSE 93 08/21/2017 1544   GLUCOSE 81 10/22/2006 1253   BUN 20 08/04/2018 1605   BUN 17.4 08/21/2017 1544   CREATININE 1.03 08/04/2018 1605   CREATININE 1.10 (H) 11/20/2017 1554   CREATININE 0.9 08/21/2017 1544   CALCIUM 9.2 08/04/2018 1605   CALCIUM 8.8 08/21/2017 1544   PROT 6.3 08/04/2018 1605   PROT 6.5 08/21/2017 1544   ALBUMIN 3.8 08/04/2018 1605   ALBUMIN 3.7 08/21/2017 1544   AST 13 08/04/2018 1605   AST 18 08/21/2017 1544   ALT 13 08/04/2018 1605   ALT 16 08/21/2017 1544   ALKPHOS 85 08/04/2018 1605   ALKPHOS 98 08/21/2017 1544   BILITOT 0.3 08/04/2018 1605   BILITOT <0.22 08/21/2017 1544   GFRNONAA 49.82 06/17/2009 1137    I No results found for: SPEP  Lab Results  Component Value Date   WBC 8.2 08/18/2018   NEUTROABS 5.5 08/18/2018   HGB 11.7 (L) 08/18/2018   HCT 35.8 (L) 08/18/2018   MCV 102.0 (H) 08/18/2018   PLT 256 08/18/2018      Chemistry      Component Value Date/Time   NA 139 08/04/2018 1605   NA 140 08/21/2017 1544    K 3.6 08/04/2018 1605   K 4.1 08/21/2017 1544   CL 102 08/04/2018 1605   CO2 30 08/04/2018 1605   CO2 25 08/21/2017 1544   BUN 20 08/04/2018 1605   BUN  17.4 08/21/2017 1544   CREATININE 1.03 08/04/2018 1605   CREATININE 1.10 (H) 11/20/2017 1554   CREATININE 0.9 08/21/2017 1544      Component Value Date/Time   CALCIUM 9.2 08/04/2018 1605   CALCIUM 8.8 08/21/2017 1544   ALKPHOS 85 08/04/2018 1605   ALKPHOS 98 08/21/2017 1544   AST 13 08/04/2018 1605   AST 18 08/21/2017 1544   ALT 13 08/04/2018 1605   ALT 16 08/21/2017 1544   BILITOT 0.3 08/04/2018 1605   BILITOT <0.22 08/21/2017 1544      No results found for: LABCA2  No components found for: LABCA125  No results for input(s): INR in the last 168 hours.  Urinalysis    Component Value Date/Time   COLORURINE YELLOW 11/20/2017 1554    STUDIES: On 11/06/2017, she underwent diagnostic bilateral mammogram that showed: Breast density category B. There is no evidence of malignancy bilaterally. Lumpectomy changes on the right.     ASSESSMENT: 62 y.o.  woman status post right breast upper inner quadrant biopsy 08/04/2014 for a clinical T2 N0, stage IIA invasive ductal carcinoma, grade 3, triple negative, with an MIB-1 of 90%.  1. neoadjuvant chemotherapy with doxorubicin and cyclophosphamide dose dense x4 completed on 10/05/14; followed by paclitaxel weekly x12 started 10/19/2014. Carboplatin added for cycles 6-12, completed 01/04/15  2. Right lumpectomy and sentinel lymph node sampling 02/01/2015 showed a complete pathologic response.  3.adjuvant radiation 03/01/2015 through 04/08/2015:    Right breast 5040 cGy in 28 sessions (no boost)    PLAN: Berdie is now 3-1/2 years out from definitive surgery for breast cancer with no evidence of disease recurrence.  This is very favorable.  Even though she looks younger than her stated age she is having  significant arthritis problems.  I am hoping she can get some help through the neuro group she is consulting.  She would benefit from swimming or tai chi if she could manage to get to 1 of those but it would be hard for her because of work and family commitments.  We will see me again in a year.  That may well be our "graduating" visit.  He knows to call for any other issues that may develop before the next visit here.    Yuma Blucher, Virgie Dad, MD  08/18/18 3:41 PM Medical Oncology and Hematology [facility] [address] [place], [place] [address] Tel. [phone]    Fax. [phone]    I, Soijett Blue am acting as scribe for Dr. Sarajane Jews C. Amarrah Meinhart.  I, Lurline Del MD, have reviewed the above documentation for accuracy and completeness, and I agree with the above.

## 2018-08-18 ENCOUNTER — Inpatient Hospital Stay: Payer: 59 | Attending: Oncology | Admitting: Oncology

## 2018-08-18 ENCOUNTER — Telehealth: Payer: Self-pay | Admitting: Oncology

## 2018-08-18 ENCOUNTER — Inpatient Hospital Stay: Payer: 59

## 2018-08-18 VITALS — BP 128/88 | HR 100 | Temp 97.7°F | Resp 18 | Ht 61.0 in | Wt 183.0 lb

## 2018-08-18 DIAGNOSIS — Z79899 Other long term (current) drug therapy: Secondary | ICD-10-CM

## 2018-08-18 DIAGNOSIS — Z171 Estrogen receptor negative status [ER-]: Secondary | ICD-10-CM | POA: Insufficient documentation

## 2018-08-18 DIAGNOSIS — Z923 Personal history of irradiation: Secondary | ICD-10-CM

## 2018-08-18 DIAGNOSIS — M722 Plantar fascial fibromatosis: Secondary | ICD-10-CM | POA: Diagnosis not present

## 2018-08-18 DIAGNOSIS — C50211 Malignant neoplasm of upper-inner quadrant of right female breast: Secondary | ICD-10-CM | POA: Diagnosis not present

## 2018-08-18 DIAGNOSIS — Z9221 Personal history of antineoplastic chemotherapy: Secondary | ICD-10-CM | POA: Diagnosis not present

## 2018-08-18 LAB — COMPREHENSIVE METABOLIC PANEL
ALK PHOS: 99 U/L (ref 38–126)
ALT: 17 U/L (ref 0–44)
AST: 14 U/L — ABNORMAL LOW (ref 15–41)
Albumin: 3.2 g/dL — ABNORMAL LOW (ref 3.5–5.0)
Anion gap: 11 (ref 5–15)
BUN: 20 mg/dL (ref 8–23)
CALCIUM: 9.2 mg/dL (ref 8.9–10.3)
CO2: 26 mmol/L (ref 22–32)
CREATININE: 1.13 mg/dL — AB (ref 0.44–1.00)
Chloride: 108 mmol/L (ref 98–111)
GFR, EST AFRICAN AMERICAN: 59 mL/min — AB (ref >60)
GFR, EST NON AFRICAN AMERICAN: 51 mL/min — AB (ref >60)
Glucose, Bld: 84 mg/dL (ref 70–99)
Potassium: 3.8 mmol/L (ref 3.5–5.1)
Sodium: 145 mmol/L (ref 135–145)
TOTAL PROTEIN: 6.5 g/dL (ref 6.5–8.1)

## 2018-08-18 LAB — CBC WITH DIFFERENTIAL/PLATELET
ABS IMMATURE GRANULOCYTES: 0.02 10*3/uL (ref 0.00–0.07)
Basophils Absolute: 0 10*3/uL (ref 0.0–0.1)
Basophils Relative: 1 %
Eosinophils Absolute: 0.2 10*3/uL (ref 0.0–0.5)
Eosinophils Relative: 2 %
HCT: 35.8 % — ABNORMAL LOW (ref 36.0–46.0)
HEMOGLOBIN: 11.7 g/dL — AB (ref 12.0–15.0)
Immature Granulocytes: 0 %
Lymphocytes Relative: 25 %
Lymphs Abs: 2 10*3/uL (ref 0.7–4.0)
MCH: 33.3 pg (ref 26.0–34.0)
MCHC: 32.7 g/dL (ref 30.0–36.0)
MCV: 102 fL — AB (ref 80.0–100.0)
MONO ABS: 0.5 10*3/uL (ref 0.1–1.0)
Monocytes Relative: 6 %
NRBC: 0 % (ref 0.0–0.2)
Neutro Abs: 5.5 10*3/uL (ref 1.7–7.7)
Neutrophils Relative %: 66 %
Platelets: 256 10*3/uL (ref 150–400)
RBC: 3.51 MIL/uL — ABNORMAL LOW (ref 3.87–5.11)
RDW: 12.9 % (ref 11.5–15.5)
WBC: 8.2 10*3/uL (ref 4.0–10.5)

## 2018-08-18 NOTE — Telephone Encounter (Signed)
Gave pt avs and calendar  °

## 2018-08-28 DIAGNOSIS — M5417 Radiculopathy, lumbosacral region: Secondary | ICD-10-CM | POA: Diagnosis not present

## 2018-08-28 DIAGNOSIS — R201 Hypoesthesia of skin: Secondary | ICD-10-CM | POA: Diagnosis not present

## 2018-08-28 DIAGNOSIS — R202 Paresthesia of skin: Secondary | ICD-10-CM | POA: Diagnosis not present

## 2018-08-28 DIAGNOSIS — M542 Cervicalgia: Secondary | ICD-10-CM | POA: Diagnosis not present

## 2018-08-28 DIAGNOSIS — M79671 Pain in right foot: Secondary | ICD-10-CM | POA: Diagnosis not present

## 2018-08-29 ENCOUNTER — Telehealth: Payer: Self-pay | Admitting: Orthopaedic Surgery

## 2018-09-05 DIAGNOSIS — H401232 Low-tension glaucoma, bilateral, moderate stage: Secondary | ICD-10-CM | POA: Diagnosis not present

## 2018-09-22 ENCOUNTER — Ambulatory Visit: Payer: Self-pay | Admitting: Podiatry

## 2018-09-25 DIAGNOSIS — R202 Paresthesia of skin: Secondary | ICD-10-CM | POA: Diagnosis not present

## 2018-09-25 DIAGNOSIS — M542 Cervicalgia: Secondary | ICD-10-CM | POA: Diagnosis not present

## 2018-09-25 DIAGNOSIS — R201 Hypoesthesia of skin: Secondary | ICD-10-CM | POA: Diagnosis not present

## 2018-09-25 DIAGNOSIS — G5601 Carpal tunnel syndrome, right upper limb: Secondary | ICD-10-CM | POA: Diagnosis not present

## 2018-09-25 DIAGNOSIS — M5417 Radiculopathy, lumbosacral region: Secondary | ICD-10-CM | POA: Diagnosis not present

## 2018-09-30 ENCOUNTER — Ambulatory Visit: Payer: Self-pay

## 2018-09-30 NOTE — Telephone Encounter (Signed)
Returned call to pt's husband. (on [place])  Stated he wanted to know if Dr. Birdie Riddle wanted his wife to take Vitamin D?  Advised husband that in the lab result note of 08/04/18, it was recommended to start Vitamin D 50,000 U weekly and an OTC Vita D, 2000 U. daily.  Husband stated "the pharmacy never called [place] to let us know it was ready."  Husband asked if it could be reordered?  Advised that the Rx should be on file at the pharmacy, and recommended that he call, and request it be filled. Asked husband what prompted the call today, to question if Vita D was ordered?   He stated "my wife has been feeling a little down, and she said that she thought she was supposed to have Vita D ordered."  Husband will contact the pharmacy. Encouraged to call back to office, if there is any problem with getting the Vita D filled.  Husband verb. Understanding; agreed.           Reason for Disposition . Health Information question, no triage required and triager able to answer question  Answer Assessment - Initial Assessment Questions 1. REASON FOR CALL or QUESTION: "What is your reason for calling today?" or "How can I best help you?" or "What question do you have that I can help answer?"     Does Dr. Birdie Riddle want my wife to take Vitamin D?  Protocols used: INFORMATION ONLY CALL-A-AH  Message from Vernona Rieger sent at 09/30/2018 10:43 AM EST   Patient's husband, Melissa Garza called and said she is fairly new to Dr Birdie Riddle and wanted to verify if she was suppose to be taking Vitamin D?

## 2018-10-07 ENCOUNTER — Encounter: Payer: Self-pay | Admitting: Podiatry

## 2018-10-07 ENCOUNTER — Ambulatory Visit (INDEPENDENT_AMBULATORY_CARE_PROVIDER_SITE_OTHER): Payer: 59 | Admitting: Podiatry

## 2018-10-07 ENCOUNTER — Other Ambulatory Visit: Payer: Self-pay | Admitting: Podiatry

## 2018-10-07 ENCOUNTER — Ambulatory Visit (INDEPENDENT_AMBULATORY_CARE_PROVIDER_SITE_OTHER): Payer: 59

## 2018-10-07 DIAGNOSIS — B070 Plantar wart: Secondary | ICD-10-CM | POA: Diagnosis not present

## 2018-10-07 DIAGNOSIS — M722 Plantar fascial fibromatosis: Secondary | ICD-10-CM | POA: Diagnosis not present

## 2018-10-07 DIAGNOSIS — M79671 Pain in right foot: Secondary | ICD-10-CM

## 2018-10-07 DIAGNOSIS — L600 Ingrowing nail: Secondary | ICD-10-CM

## 2018-10-07 DIAGNOSIS — M79672 Pain in left foot: Principal | ICD-10-CM

## 2018-10-08 ENCOUNTER — Ambulatory Visit: Payer: Self-pay | Admitting: *Deleted

## 2018-10-08 NOTE — Telephone Encounter (Signed)
Pt calling stating she has been experiencing shortness of breath for approximately one month that is not getting better.Pt states it is getting to the point that people around her are noticing it when she does activities. Pt denies any chest pain, dizziness or other symptoms at this time. Pt states she does not have a history of lung or heart disease and has never been seen or treated for shortness of breath.Pt states she has a history of plantar fascitis and has had swelling in her feet due to receiving injections but denies that the swelling is going up to her legs.Pt states she has a little wheezing "deep down" with doing activity.Pt states she has been out of work for about 3 weeks( taken out by another physician due to stress) and just returned to work on Monday. Pt requesting to be seen for an appt after 2:30 pm. Pt advised to seek treatment in the ED if symptoms become worse or to call the office back. Pt verbalized understanding.  Reason for Disposition . [1] MODERATE difficulty breathing (e.g., speaks in phrases, SOB even at rest, pulse 100-120) AND [2] NEW-onset or WORSE than normal  Answer Assessment - Initial Assessment Questions 1. RESPIRATORY STATUS: "Describe your breathing?" (e.g., wheezing, shortness of breath, unable to speak, severe coughing)      Shortness of breath 2. ONSET: "When did this breathing problem begin?"      A month ago 3. PATTERN "Does the difficult breathing come and go, or has it been constant since it started?"      Constant with activity, couldn't keep up with sister when walking 4. SEVERITY: "How bad is your breathing?" (e.g., mild, moderate, severe)    - MILD: No SOB at rest, mild SOB with walking, speaks normally in sentences, can lay down, no retractions, pulse < 100.    - MODERATE: SOB at rest, SOB with minimal exertion and prefers to sit, cannot lie down flat, speaks in phrases, mild retractions, audible wheezing, pulse 100-120.    - SEVERE: Very SOB at rest,  speaks in single words, struggling to breathe, sitting hunched forward, retractions, pulse > 120   moderate 5. RECURRENT SYMPTOM: "Have you had difficulty breathing before?" If so, ask: "When was the last time?" and "What happened that time?"      No 6. CARDIAC HISTORY: "Do you have any history of heart disease?" (e.g., heart attack, angina, bypass surgery, angioplasty)      No 7. LUNG HISTORY: "Do you have any history of lung disease?"  (e.g., pulmonary embolus, asthma, emphysema)     No 8. CAUSE: "What do you think is causing the breathing problem?"      "Just feels like a fat person, has gained weight over the last year 9. OTHER SYMPTOMS: "Do you have any other symptoms? (e.g., dizziness, runny nose, cough, chest pain, fever)     No 10. PREGNANCY: "Is there any chance you are pregnant?" "When was your last menstrual period?"       n/a 11. TRAVEL: "Have you traveled out of the country in the last month?" (e.g., travel history, exposures)       No  Protocols used: BREATHING DIFFICULTY-A-AH

## 2018-10-09 ENCOUNTER — Telehealth: Payer: Self-pay | Admitting: *Deleted

## 2018-10-09 ENCOUNTER — Other Ambulatory Visit: Payer: Self-pay

## 2018-10-09 ENCOUNTER — Ambulatory Visit (INDEPENDENT_AMBULATORY_CARE_PROVIDER_SITE_OTHER): Payer: 59 | Admitting: Family Medicine

## 2018-10-09 ENCOUNTER — Encounter: Payer: Self-pay | Admitting: Family Medicine

## 2018-10-09 VITALS — BP 132/88 | HR 103 | Temp 98.1°F | Resp 16 | Ht 61.0 in | Wt 187.0 lb

## 2018-10-09 DIAGNOSIS — M545 Low back pain, unspecified: Secondary | ICD-10-CM

## 2018-10-09 DIAGNOSIS — R0602 Shortness of breath: Secondary | ICD-10-CM | POA: Diagnosis not present

## 2018-10-09 DIAGNOSIS — M549 Dorsalgia, unspecified: Secondary | ICD-10-CM | POA: Insufficient documentation

## 2018-10-09 MED ORDER — TIZANIDINE HCL 4 MG PO TABS: 4.0000 mg | ORAL_TABLET | Freq: Three times a day (TID) | ORAL | 0 refills | Status: DC | PRN

## 2018-10-09 MED ORDER — NONFORMULARY OR COMPOUNDED ITEM: 99 refills | Status: DC

## 2018-10-09 NOTE — Patient Instructions (Signed)
We'll notify you of your lab results and determine the next steps EKG looks good- no obvious cause for shortness of breath If your symptoms change or worsen, please go to the ER for evaluation Call with any questions or concerns Happy Holidays!!!

## 2018-10-09 NOTE — Assessment & Plan Note (Addendum)
New to provider, recurrent issue for pt.  Will start Zanaflex prn.  Encouraged weight loss.  Will continue to follow.

## 2018-10-09 NOTE — Assessment & Plan Note (Signed)
New.  Pt reports this started suddenly 1 month ago.  Denies CP.  SOB causes anxiety which worsens the situation.  No tobacco hx.  + family hx CAD, DVT/PE.  EKG w/o acute changes. Get labs to assess for anemia, thyroid abnormality, electrolyte disturbance. Will get D Dimer to determine if CTA needed.  If D Dimer negative will refer to pulmonary.  Reviewed supportive care and red flags that should prompt return.  Pt expressed understanding and is in agreement w/ plan.

## 2018-10-09 NOTE — Telephone Encounter (Signed)
Dr. Jacqualyn Posey ordered [facility]. Faxed to [facility].

## 2018-10-09 NOTE — Progress Notes (Signed)
   Subjective:    Patient ID: Melissa Garza, female    DOB: August 18, 1956, 62 y.o.   MRN: [id number]  HPI SOB- pt reports she will develop SOB going from house to car or car to work.  Dad commented on the fact that she was 'breathing hard' over Thanksgiving.  sxs started ~1 month ago.  Pt thinks she was short of breath prior to switching to Trintellix 3 weeks ago.  Pt has never been a smoker.  No recent swelling of hands/feet.  + family hx of CAD- dad, mom.  Denies CP w/ SOB.  SOB causes anxiety.  Will intermittently hear wheezing.  + DVT in mom and dad.  sxs started fairly suddenly.  Back pain- intermittent issue, worsens w/ activity.  Across lumbar spine- 'it tightens up on me'.   Review of Systems For ROS see HPI     Objective:   Physical Exam  Constitutional: She is oriented to person, place, and time. She appears well-developed and well-nourished. No distress.  HENT:  Head: Normocephalic and atraumatic.  Eyes: Pupils are equal, round, and reactive to light. Conjunctivae and EOM are normal.  Neck: Normal range of motion. Neck supple. No thyromegaly present.  Cardiovascular: Normal rate, regular rhythm, normal heart sounds and intact distal pulses.  No murmur heard. Pulmonary/Chest: Effort normal and breath sounds normal. No respiratory distress.  Abdominal: Soft. She exhibits no distension. There is no tenderness.  Musculoskeletal: She exhibits no edema.       Right lower leg: She exhibits no tenderness and no edema.       Left lower leg: She exhibits no tenderness and no edema.  Lymphadenopathy:    She has no cervical adenopathy.  Neurological: She is alert and oriented to person, place, and time.  Skin: Skin is warm and dry.  Psychiatric: She has a normal mood and affect. Her behavior is normal.  Vitals reviewed.         Assessment & Plan:

## 2018-10-10 ENCOUNTER — Other Ambulatory Visit: Payer: Self-pay | Admitting: Family Medicine

## 2018-10-10 DIAGNOSIS — R0602 Shortness of breath: Secondary | ICD-10-CM

## 2018-10-10 LAB — CBC WITH DIFFERENTIAL/PLATELET
Basophils Absolute: 0.1 10*3/uL (ref 0.0–0.1)
Basophils Relative: 1 % (ref 0.0–3.0)
EOS ABS: 0 10*3/uL (ref 0.0–0.7)
Eosinophils Relative: 0.4 % (ref 0.0–5.0)
HCT: 38.6 % (ref 36.0–46.0)
Hemoglobin: 12.8 g/dL (ref 12.0–15.0)
Lymphocytes Relative: 20.2 % (ref 12.0–46.0)
Lymphs Abs: 2.2 10*3/uL (ref 0.7–4.0)
MCHC: 33.2 g/dL (ref 30.0–36.0)
MCV: 98.5 fl (ref 78.0–100.0)
Monocytes Absolute: 0.5 10*3/uL (ref 0.1–1.0)
Monocytes Relative: 5 % (ref 3.0–12.0)
Neutro Abs: 7.8 10*3/uL — ABNORMAL HIGH (ref 1.4–7.7)
Neutrophils Relative %: 73.4 % (ref 43.0–77.0)
Platelets: 255 10*3/uL (ref 150.0–400.0)
RBC: 3.92 Mil/uL (ref 3.87–5.11)
RDW: 13.9 % (ref 11.5–15.5)
WBC: 10.7 10*3/uL — ABNORMAL HIGH (ref 4.0–10.5)

## 2018-10-10 LAB — BASIC METABOLIC PANEL
BUN: 29 mg/dL — ABNORMAL HIGH (ref 6–23)
CO2: 25 mEq/L (ref 19–32)
Calcium: 8.9 mg/dL (ref 8.4–10.5)
Chloride: 107 mEq/L (ref 96–112)
Creatinine, Ser: 1.08 mg/dL (ref 0.40–1.20)
GFR: 54.46 mL/min — ABNORMAL LOW (ref >60.00)
Glucose, Bld: 88 mg/dL (ref 70–99)
Potassium: 3.9 mEq/L (ref 3.5–5.1)
Sodium: 142 mEq/L (ref 135–145)

## 2018-10-10 LAB — TSH: TSH: 1.2 u[IU]/mL (ref 0.35–4.50)

## 2018-10-10 LAB — D-DIMER, QUANTITATIVE: D-Dimer, Quant: 0.27 mcg/mL FEU (ref <0.50)

## 2018-10-14 DIAGNOSIS — Z01419 Encounter for gynecological examination (general) (routine) without abnormal findings: Secondary | ICD-10-CM | POA: Diagnosis not present

## 2018-10-14 DIAGNOSIS — Z124 Encounter for screening for malignant neoplasm of cervix: Secondary | ICD-10-CM | POA: Diagnosis not present

## 2018-10-14 NOTE — Progress Notes (Signed)
Subjective:   Patient ID: Melissa Garza, female   DOB: 62 y.o.   MRN: [id number]   HPI 62 year old female presents the office today for concerns of bilateral heel pain which stronger the last 1 to 2 years is been gradually getting worse.  She states that she is getting some pain in the top of her foot she states that she has had injections does not seem to help.  She was sent to a neurologist she is been getting back and neck injections which has not been helping her feet.  She thinks it may have made her headaches better however.  She also is concerned about warts on the bottom of her feet.  She has a couple of larger areas of warts have been ongoing for some time.  She is tried some over-the-counter medicine without any significant improvement.  Also she is concerned about her left big toenail started become ingrown on both sides of the big toenail which is been ongoing for last 2 to 3 months and gradually getting worse as well.  No redness or drainage or any swelling.   Review of Systems  All other systems reviewed and are negative.  Past Medical History:  Diagnosis Date  . Anxiety   . Cancer [facility])    cancer of right breast  . Cataracts, bilateral 2018  . Depression   . Environmental allergies 2007  . GERD (gastroesophageal reflux disease)    PMH  . Glaucoma   . Headache    chronic  . Hepatitis    Hx: of Hepatitis not sure which one  . Hot flashes   . Personal history of chemotherapy   . Personal history of radiation therapy   . PONV (postoperative nausea and vomiting)   . S/P radiation therapy 03/01/2015 through 06/03/201604/26/2016 through 04/08/2015     Right breast 5040 cGy in 28 sessions (no boost)  . Shortness of breath    with exertion    Past Surgical History:  Procedure Laterality Date  . AUGMENTATION MAMMAPLASTY    . BREAST ENHANCEMENT SURGERY  2008  . BREAST  LUMPECTOMY Right 2016  . BREAST SURGERY     Biopsy  . COLONOSCOPY    . DILATION AND CURETTAGE OF UTERUS    . EYE SURGERY     Lasik  . PORT-A-CATH REMOVAL N/A 02/01/2015   Procedure: REMOVAL PORT-A-CATH;  Surgeon: Excell Seltzer, MD;  Location: [place];  Service: General;  Laterality: N/A;  . PORTACATH PLACEMENT N/A 08/13/2014   Procedure: INSERTION PORT-A-CATH;  Surgeon: Excell Seltzer, MD;  Location: [place];  Service: General;  Laterality: N/A;  . TONSILECTOMY/ADENOIDECTOMY WITH MYRINGOTOMY    . TUBAL LIGATION       Current Outpatient Medications:  .  ALPRAZolam (XANAX) 1 MG tablet, Take 1 mg by mouth 4 (four) times daily as needed for anxiety., Disp: , Rfl:  .  brimonidine-timolol (COMBIGAN) 0.2-0.5 % ophthalmic solution, Place 1 drop into the right eye 2 (two) times daily., Disp: , Rfl:  .  buPROPion (WELLBUTRIN XL) 150 MG 24 hr tablet, Take 150 mg by mouth daily., Disp: , Rfl:  .  clobetasol cream (TEMOVATE) 9.32 %, Apply 1 application topically 2 (two) times daily., Disp: , Rfl:  .  gabapentin (NEURONTIN) 300 MG capsule, Take 300 mg by mouth 3 (three) times daily., Disp: , Rfl:  .  methylphenidate (RITALIN) 20 MG tablet, Take 20 mg by mouth 2 (two) times daily., Disp: , Rfl:  .  rosuvastatin (  CRESTOR) 10 MG tablet, Take 1 tablet (10 mg total) by mouth daily., Disp: 30 tablet, Rfl: 6 .  travoprost, benzalkonium, (TRAVATAN) 0.004 % ophthalmic solution, Place 1 drop into both eyes at bedtime. , Disp: , Rfl:  .  Vitamin D, Ergocalciferol, (DRISDOL) 50000 units CAPS capsule, Take 1 capsule (50,000 Units total) by mouth every 7 (seven) days., Disp: 12 capsule, Rfl: 0 .  zolpidem (AMBIEN) 10 MG tablet, Take 10 mg by mouth at bedtime as needed for sleep., Disp: , Rfl:  .  hydrocortisone 2.5 % lotion, Apply topically 2 (two) times daily., Disp: , Rfl:  .  NONFORMULARY OR COMPOUNDED ITEM, [facility]:  Wart Cream - Cimetidine 2%, Deoxy-D-Glucose 0.2%, Fluorouracil  5%, Salicylic acid, apply to affected area daily., Disp: 100 each, Rfl: prn .  tiZANidine (ZANAFLEX) 4 MG tablet, Take 1 tablet (4 mg total) by mouth every 8 (eight) hours as needed for muscle spasms., Disp: 30 tablet, Rfl: 0 .  TRINTELLIX 20 MG TABS tablet, Take 1 tablet by mouth daily., Disp: , Rfl: 12  Allergies  Allergen Reactions  . Hydrocodone Hives and Itching          Objective:  Physical Exam  General: AAO x3, NAD  Dermatological: On the bottom of her left heel there is a large hyperkeratotic lesion and upon debridement appears to be verruca.  There are couple smaller satellite lesions as well.  No edema, erythema, drainage or pus or any signs of infection.  No open lesions identified otherwise.  The left hallux toenail started become ingrown on both medial lateral corners with minimal tenderness palpation.  There is no edema, erythema or any signs of infection.  No open lesions.  Vascular: Dorsalis Pedis artery and Posterior Tibial artery pedal pulses are 2/4 bilateral with immedate capillary fill time.  There is no pain with calf compression, swelling, warmth, erythema.   Neruologic: Grossly intact via light touch bilateral.  Protective threshold with Semmes Wienstein monofilament intact to all pedal sites bilateral.  Negative Tinel sign.  Musculoskeletal: There is some mild tenderness palpation on the plantar medial tubercle of the calcaneus at the insertion of plantar fashion bilaterally.  Also some mild discomfort on the dorsal aspect of the foot.  There is no specific area pinpoint any tenderness or pain to vibratory sensation.  Achilles tendon, flexor, extensor tendons appear intact without any restriction.  Muscular strength 5/5 in all groups tested bilateral.  Gait: Unassisted, Nonantalgic.       Assessment:  Left heel wart, left hallux toenail, bilateral foot pain     Plan:  -Treatment options discussed including all alternatives, risks, and  complications -Etiology of symptoms were discussed -Regards to the wart I did debrided lightly today.  I ordered a compound cream for the wart I will do this today through [place] to the toenail discussed partial nail avulsion but we held off on this today.  There is no signs of infection continue to monitor.  She will do Epson salt soaks daily as well. -Regards to the foot pain discussed with her plantar fasciitis as well.  Discussed stretching, icing exercises daily.  Discussed with her shoe modifications and orthotics.  I do not think this is a neurological issue.  Discussed steroid injection to the heel.  Trula Slade DPM

## 2018-10-30 ENCOUNTER — Encounter: Payer: Self-pay | Admitting: Podiatry

## 2018-10-30 ENCOUNTER — Ambulatory Visit (INDEPENDENT_AMBULATORY_CARE_PROVIDER_SITE_OTHER): Payer: 59 | Admitting: Podiatry

## 2018-10-30 DIAGNOSIS — B070 Plantar wart: Secondary | ICD-10-CM | POA: Diagnosis not present

## 2018-10-30 DIAGNOSIS — M722 Plantar fascial fibromatosis: Secondary | ICD-10-CM

## 2018-10-30 MED ORDER — DICLOFENAC SODIUM 1 % TD GEL: 2.0000 g | Freq: Four times a day (QID) | TRANSDERMAL | 2 refills | Status: DC

## 2018-10-30 NOTE — Progress Notes (Signed)
Subjective: 62 year old female presents the office today for follow-up evaluation of arch pain.  She states the inserts have been helpful since she has been wearing them and the modification I did which was a lateral wedge is been helpful.  She still gets some pain particular today she is been standing on her feet for long period time she is having some discomfort but overall she does feel that she is made progress.  She is also been using the compound cream for the wart but she is not sure how it is doing as her husband applies the medication.  Overall she has no other concerns or new concerns. Denies any systemic complaints such as fevers, chills, nausea, vomiting. No acute changes since last appointment, and no other complaints at this time.   Objective: AAO x3, NAD DP/PT pulses palpable bilaterally, CRT less than 3 seconds On the plantar aspect the left foot submetatarsal 5 area was a cluster of verruca.  I did debride this today and overall the verruca are much improved.  There is no edema, erythema, or signs of infection. There is mild tenderness along more the lateral band of the plantar fashion the left foot today.  I can tell that the plantar fascial is tight on the left side worse than the right.  Subjectively this is worse than the majority of tenderness to the arch of the foot.  There is no area of pinpoint tenderness or auditory sensation there is no overlying edema Mild incurvation present to the hallux toenails without any edema, erythema or any signs of infection there is no pain. No open lesions or pre-ulcerative lesions.  No pain with calf compression, swelling, warmth, erythema  Assessment: 62 year old female with resolving verruca, arch pain/plantar fasciitis  Plan: -All treatment options discussed with the patient including all alternatives, risks, complications.  -Today.  Continue compound cream.  This is been helpful -Prescribed Voltaren gel to apply with also discussed  physical therapy.  Would continue the orthotics and continue home stretching, icing exercises daily but if symptoms continue will consider PT -Currently ingrown toenail is asymptomatic and not causing any issues she reports.  We will continue to monitor. -Patient encouraged to call the office with any questions, concerns, change in symptoms.   Melissa Garza DPM

## 2018-10-31 ENCOUNTER — Other Ambulatory Visit: Payer: Self-pay | Admitting: Family Medicine

## 2018-10-31 MED ORDER — TRINTELLIX 20 MG PO TABS: 20.0000 mg | ORAL_TABLET | Freq: Every day | ORAL | 1 refills | Status: DC

## 2018-10-31 MED ORDER — ROSUVASTATIN CALCIUM 10 MG PO TABS: 10.0000 mg | ORAL_TABLET | Freq: Every day | ORAL | 4 refills | Status: DC

## 2018-10-31 MED ORDER — VITAMIN D (ERGOCALCIFEROL) 1.25 MG (50000 UNIT) PO CAPS: 50000.0000 [IU] | ORAL_CAPSULE | ORAL | 0 refills | Status: DC

## 2018-10-31 NOTE — Telephone Encounter (Signed)
Requested medication (s) are due for refill today: yes  Requested medication (s) are on the active medication list: yes  Last refill:  Historical refill Trintellix, Vit D one time order per lab note  Future visit scheduled: yes  Notes to clinic:  Unable to refill per protocol    Requested Prescriptions  Pending Prescriptions Disp Refills   TRINTELLIX 20 MG TABS tablet 30 tablet 12    Sig: Take 1 tablet (20 mg total) by mouth daily.     Psychiatry: Antidepressants - Serotonin Modulator Passed - 10/31/2018 10:42 AM      Passed - Completed PHQ-2 or PHQ-9 in the last 360 days.      Passed - Valid encounter within last 6 months    Recent Outpatient Visits          3 weeks ago SOB (shortness of breath) on exertion   [facility] [place] Midge Minium, MD   2 months ago Chronic nonintractable headache, unspecified headache type   [facility] [place] Midge Minium, MD   7 months ago Elevated LDL cholesterol level   [facility] [place], Mortimer Fries, MD   11 months ago Viral syndrome   [facility] [place], Mortimer Fries, MD      Future Appointments            In 3 months Birdie Riddle, Aundra Millet, MD [facility] [place], [facility]          Vitamin D, Ergocalciferol, (DRISDOL) 1.25 MG (50000 UT) CAPS capsule 12 capsule 0    Sig: Take 1 capsule (50,000 Units total) by mouth every 7 (seven) days.     Endocrinology:  Vitamins - Vitamin D Supplementation Failed - 10/31/2018 10:42 AM      Failed - 50,000 IU strengths are not delegated      Failed - Phosphate in normal range and within 360 days    No results found for: PHOS       Failed - Vit D in normal range and within 360 days    VITD  Date Value Ref Range Status  08/04/2018 23.16 (L) 30.00 - 100.00 ng/mL Final         Passed - Ca in normal range and within 360 days    Calcium  Date  Value Ref Range Status  10/09/2018 8.9 8.4 - 10.5 mg/dL Final  08/21/2017 8.8 8.4 - 10.4 mg/dL Final         Passed - Valid encounter within last 12 months    Recent Outpatient Visits          3 weeks ago SOB (shortness of breath) on exertion   [facility] [place] Midge Minium, MD   2 months ago Chronic nonintractable headache, unspecified headache type   [facility] Midge Minium, MD   7 months ago Elevated LDL cholesterol level   [facility] [place], Mortimer Fries, MD   11 months ago Viral syndrome   [facility] [place], Mortimer Fries, MD      Future Appointments            In 3 months Birdie Riddle, Aundra Millet, MD [facility] [place], [facility]         Signed Prescriptions Disp Refills   rosuvastatin (CRESTOR) 10 MG tablet 30 tablet 4    Sig: Take 1 tablet (10 mg total) by mouth daily.  Cardiovascular:  Antilipid - Statins Failed - 10/31/2018 10:42 AM      Failed - Total Cholesterol in normal range and within 360 days    Cholesterol  Date Value Ref Range Status  08/04/2018 266 (H) 0 - 200 mg/dL Final    Comment:    ATP III Classification       Desirable:  < 200 mg/dL               Borderline High:  200 - 239 mg/dL          High:  > = 240 mg/dL         Failed - LDL in normal range and within 360 days    LDL Cholesterol (Calc)  Date Value Ref Range Status  11/20/2017 182 (H) mg/dL (calc) Final    Comment:    Reference range: <100 . Desirable range <100 mg/dL for primary prevention;   <70 mg/dL for patients with CHD or diabetic patients  with > or = 2 CHD risk factors. Marland Kitchen LDL-C is now calculated using the Martin-Hopkins  calculation, which is a validated novel method providing  better accuracy than the Friedewald equation in the  estimation of LDL-C.  Cresenciano Genre et al. Annamaria Helling. 0258;527(78): 2061-2068   (http://education.[employer].com/faq/FAQ164)    LDL Cholesterol  Date Value Ref Range Status  08/04/2018 176 (H) 0 - 99 mg/dL Final         Failed - Triglycerides in normal range and within 360 days    Triglycerides  Date Value Ref Range Status  08/04/2018 174.0 (H) 0.0 - 149.0 mg/dL Final    Comment:    Normal:  <150 mg/dLBorderline High:  150 - 199 mg/dL         Passed - HDL in normal range and within 360 days    HDL  Date Value Ref Range Status  08/04/2018 56.10 >39.00 mg/dL Final         Passed - Patient is not pregnant      Passed - Valid encounter within last 12 months    Recent Outpatient Visits          3 weeks ago SOB (shortness of breath) on exertion   [facility] [place] Midge Minium, MD   2 months ago Chronic nonintractable headache, unspecified headache type   [facility] [place] Midge Minium, MD   7 months ago Elevated LDL cholesterol level   [facility] [place], Mortimer Fries, MD   11 months ago Viral syndrome   [facility] [place], Mortimer Fries, MD      Future Appointments            In 3 months T[place], Aundra Millet, MD [facility] [place], [facility]

## 2018-10-31 NOTE — Telephone Encounter (Signed)
Copied from [place] [id number]. Topic: Quick Communication - Rx Refill/Question >> Oct 31, 2018 10:23 AM Virl Axe D wrote: Medication: rosuvastatin (CRESTOR) 10 MG tablet / TRINTELLIX 20 MG TABS tablet / Vitamin D, Ergocalciferol, (DRISDOL) 50000 units CAPS capsule / Pt's husband [name] requesting a 90 day supply for these rx's and to have them go through [facility]. Please advise.  Has the patient contacted their pharmacy? Yes.   (Agent: If no, request that the patient contact the pharmacy for the refill.) (Agent: If yes, when and what did the pharmacy advise?)  Preferred Pharmacy (with phone number or street name): [place], [place] [phone] (Phone) [phone] (Fax)    Agent: Please be advised that RX refills may take up to 3 business days. We ask that you follow-up with your pharmacy.

## 2018-10-31 NOTE — Telephone Encounter (Signed)
Please advise on Vit D refill. R sent in Oct, does she need to continue?

## 2018-11-03 DIAGNOSIS — R102 Pelvic and perineal pain: Secondary | ICD-10-CM | POA: Diagnosis not present

## 2018-11-27 ENCOUNTER — Ambulatory Visit (INDEPENDENT_AMBULATORY_CARE_PROVIDER_SITE_OTHER): Payer: 59 | Admitting: Podiatry

## 2018-11-27 ENCOUNTER — Encounter: Payer: Self-pay | Admitting: Podiatry

## 2018-11-27 DIAGNOSIS — L600 Ingrowing nail: Secondary | ICD-10-CM | POA: Diagnosis not present

## 2018-11-27 DIAGNOSIS — M722 Plantar fascial fibromatosis: Secondary | ICD-10-CM

## 2018-11-27 DIAGNOSIS — B070 Plantar wart: Secondary | ICD-10-CM | POA: Diagnosis not present

## 2018-11-27 NOTE — Patient Instructions (Signed)

## 2018-12-11 ENCOUNTER — Other Ambulatory Visit: Payer: 59 | Admitting: Orthotics

## 2018-12-15 NOTE — Progress Notes (Signed)
Subjective: 63 year old female presents the office today for follow-up evaluation of arch pain.  Overall she states that she still getting discomfort.  She does feel that the modification with the orthotic was somewhat helpful.  She states that she is continue to get pain more to the arch of the foot and the ball of the heel.  She has been using the compound cream for the wart which is been helpful.  Her husband is been applying this at nighttime and overall is getting better slowly.  Denies any pain to the area denies any redness or drainage or any swelling.  Otherwise she denies any fevers, chills, nausea, vomiting.  No calf pain, chest pain, shortness of breath.   Objective: AAO x3, NAD DP/PT pulses palpable bilaterally, CRT less than 3 seconds On the plantar aspect the left foot submetatarsal 5 area was a cluster of verruca.  Overall history is becoming more superficial.  I debrided this today.  Smaller in size.  No swelling redness or drainage or any signs of infection there is no surrounding erythema, ascending cellulitis.  There is continuation of tenderness on the plantar aspect the foot on the arch as well as along the heel on insertion of the plantar fascia.  There is no pain with lateral compression of calcaneus.  No pain the Achilles tendon.  No pain to the dorsal foot.  There is no edema, erythema. Mild incurvation present to the hallux toenails without any edema, erythema or any signs of infection there is no pain.  No pain today.  No signs of infection. No open lesions or pre-ulcerative lesions.  No pain with calf compression, swelling, warmth, erythema  Assessment: 63 year old female with resolving verruca, arch pain/plantar fasciitis; ingrowing toenails  Plan: -All treatment options discussed with the patient including all alternatives, risks, complications.  -I debrided the wart today.  Overall is getting better appearing continue the compound cream. We getting a laser next  month we can try as well if there is no resolution. -Ingrown toenails are asymptomatic currently.  Monitor for any signs or symptoms of infection. -Regards to the foot pain the inserts have been somewhat helpful.  I do want her to follow back up with a repeat see if he can modify the inserts to help increase the arch.  Also to incorporate the lateral wedge into the orthotic.  Under continue with stretching, icing.  Discussed school therapy. Consider EPAT.   Trula Slade DPM

## 2018-12-23 ENCOUNTER — Ambulatory Visit: Payer: 59 | Admitting: Podiatry

## 2018-12-25 ENCOUNTER — Ambulatory Visit: Payer: 59 | Admitting: Podiatry

## 2018-12-26 ENCOUNTER — Other Ambulatory Visit: Payer: Self-pay | Admitting: Oncology

## 2018-12-26 DIAGNOSIS — Z853 Personal history of malignant neoplasm of breast: Secondary | ICD-10-CM

## 2018-12-30 ENCOUNTER — Other Ambulatory Visit: Payer: Self-pay

## 2018-12-31 ENCOUNTER — Other Ambulatory Visit: Payer: Self-pay | Admitting: Adult Health

## 2018-12-31 DIAGNOSIS — Z853 Personal history of malignant neoplasm of breast: Secondary | ICD-10-CM

## 2019-01-01 ENCOUNTER — Ambulatory Visit
Admission: RE | Admit: 2019-01-01 | Discharge: 2019-01-01 | Disposition: A | Discharge status: Home / Self Care | Payer: 59 | Source: Ambulatory Visit | Attending: Oncology | Admitting: Oncology

## 2019-01-01 DIAGNOSIS — R928 Other abnormal and inconclusive findings on diagnostic imaging of breast: Secondary | ICD-10-CM | POA: Diagnosis not present

## 2019-01-01 DIAGNOSIS — R922 Inconclusive mammogram: Secondary | ICD-10-CM | POA: Diagnosis not present

## 2019-01-01 DIAGNOSIS — Z853 Personal history of malignant neoplasm of breast: Secondary | ICD-10-CM

## 2019-01-01 LAB — HM PAP SMEAR

## 2019-01-07 ENCOUNTER — Encounter: Payer: Self-pay | Admitting: Neurology

## 2019-01-07 ENCOUNTER — Other Ambulatory Visit: Payer: Self-pay

## 2019-01-07 ENCOUNTER — Ambulatory Visit (INDEPENDENT_AMBULATORY_CARE_PROVIDER_SITE_OTHER): Payer: 59 | Admitting: Neurology

## 2019-01-07 VITALS — BP 148/103 | HR 56 | Resp 18 | Ht 61.0 in | Wt 183.5 lb

## 2019-01-07 DIAGNOSIS — G43709 Chronic migraine without aura, not intractable, without status migrainosus: Secondary | ICD-10-CM | POA: Insufficient documentation

## 2019-01-07 DIAGNOSIS — F329 Major depressive disorder, single episode, unspecified: Secondary | ICD-10-CM

## 2019-01-07 DIAGNOSIS — IMO0002 Reserved for concepts with insufficient information to code with codable children: Secondary | ICD-10-CM

## 2019-01-07 DIAGNOSIS — R413 Other amnesia: Secondary | ICD-10-CM

## 2019-01-07 DIAGNOSIS — F32A Depression, unspecified: Secondary | ICD-10-CM

## 2019-01-07 MED ORDER — TOPIRAMATE 100 MG PO TABS: 100.0000 mg | ORAL_TABLET | Freq: Every day | ORAL | 11 refills | Status: DC

## 2019-01-07 MED ORDER — RIZATRIPTAN BENZOATE 5 MG PO TBDP: 5.0000 mg | ORAL_TABLET | ORAL | 12 refills | Status: DC | PRN

## 2019-01-07 NOTE — Progress Notes (Signed)
PATIENT: Melissa Garza DOB: November 24, 1955  Chief Complaint  Patient presents with  . Memory Loss    Rm. 4.  Melissa Garza is here for eval of forgetfulness, difficulty completing tasks at work, first noted 6-8 mos. ago.  She is a Glass blower/designer at [employer]. Sts. she has been placed on leave, due to mistakes, and a severence package is being prepared for her so that she can retire at the end of March, 2020. Sees Dr. Toy Care for ADHD.  Sts. she had been taking Concerta and doing well, but her ins. changed and no longer covered it, so she was switched to Ritalin 20mg  bid. Dose has not been adjusted since she started  . ADHD    it. She also c/o depression for years--sees Dr. Toy Care for this as well.  Sts. Prozac was d/c, Wellbutrin and Trintellix were added 2-3 mos. ago. "I don't like to be around people. I stay in my room most of the time."/fim  . PCP    Annye Asa  . MMSE    29; 11 animals.     HISTORICAL  Melissa Garza is a 63 year old female, seen in request by her primary care physician Dr. Midge Minium, and psychiatrist Dr. Toy Care, Rupinder for evaluation of memory loss, ADHD, initial evaluation was on January 07, 2019.  I have reviewed and summarized the referring note from the referring physician.  She had long history of depression, used to be treated with Prozac, recently switched to Trintellix, because of worsening depression.  She graduated from [employer] school, work as a Glass blower/designer, she has been doing the same job for 8 years, recent 6 months, she was noted to repeating the same mistakes, the package was sent in because she forgot to place the menus into the package  She also has a diagnosis of ADHD, was taking Concerta, but because of insurance change, she is not taking Ritalin 20 mg twice a day, she complains of difficulty focusing,  She complains of difficulty sleeping, wake up frequently at nighttime, misplace things, crying easily, lack of motivations, staying at her  room most of the time  Her father suffered dementia, her elderly brother also suffered dementia,  She also complains of frequent headaches, 5 times each week, she has been taking frequent Tylenol, severe headache with migraine features, with light noise sensitivity, nauseous, caffeine usually helps.  Laboratory evaluations in 2019 showed normal B12, vitamin D was mildly decreased 23, normal TSH, CBC, BMP   REVIEW OF SYSTEMS: Full 14 system review of systems performed and notable only for fever, chills, weight gain, fatigue, hearing loss, rash, itching, loss of vision, snoring, incontinence, easy bruising, feeling hot, cold, increased thirst, joint pain, aching muscles, headaches, depression, anxiety, decreased energy, disinterested in activities, racing thoughts, insomnia, snoring All other review of systems were negative.  ALLERGIES: Allergies  Allergen Reactions  . Hydrocodone Hives and Itching    HOME MEDICATIONS: Current Outpatient Medications  Medication Sig Dispense Refill  . ALPRAZolam (XANAX) 1 MG tablet Take 1 mg by mouth 4 (four) times daily as needed for anxiety.    . brimonidine-timolol (COMBIGAN) 0.2-0.5 % ophthalmic solution Place 1 drop into the right eye 2 (two) times daily.    Marland Kitchen buPROPion (WELLBUTRIN XL) 150 MG 24 hr tablet Take 150 mg by mouth daily.    . clobetasol cream (TEMOVATE) 3.42 % Apply 1 application topically 2 (two) times daily.    . diclofenac sodium (VOLTAREN) 1 % GEL Apply 2 g  topically 4 (four) times daily. Rub into affected area of foot 2 to 4 times daily 100 g 2  . hydrocortisone 2.5 % lotion Apply topically 2 (two) times daily.    . methylphenidate (RITALIN) 20 MG tablet Take 20 mg by mouth 2 (two) times daily.    . NONFORMULARY OR COMPOUNDED ITEM [facility]:  Wart Cream - Cimetidine 2%, Deoxy-D-Glucose 0.2%, Fluorouracil 5%, Salicylic acid, apply to affected area daily. 100 each prn  . rosuvastatin (CRESTOR) 10 MG tablet Take 1 tablet (10 mg  total) by mouth daily. 30 tablet 4  . travoprost, benzalkonium, (TRAVATAN) 0.004 % ophthalmic solution Place 1 drop into both eyes at bedtime.     . TRINTELLIX 20 MG TABS tablet Take 1 tablet (20 mg total) by mouth daily. 90 tablet 1  . Vitamin D, Ergocalciferol, (DRISDOL) 1.25 MG (50000 UT) CAPS capsule Take 1 capsule (50,000 Units total) by mouth every 7 (seven) days. 12 capsule 0  . zolpidem (AMBIEN) 10 MG tablet Take 10 mg by mouth at bedtime as needed for sleep.     No current facility-administered medications for this visit.     PAST MEDICAL HISTORY: Past Medical History:  Diagnosis Date  . Anxiety   . Cancer [facility])    cancer of right breast  . Cataracts, bilateral 2018  . Depression   . Environmental allergies 2007  . GERD (gastroesophageal reflux disease)    PMH  . Glaucoma   . Glaucoma   . Headache    chronic  . Hepatitis    Hx: of Hepatitis not sure which one  . Hot flashes   . Personal history of chemotherapy   . Personal history of radiation therapy   . PONV (postoperative nausea and vomiting)   . S/P radiation therapy 03/01/2015 through 06/03/201604/26/2016 through 04/08/2015     Right breast 5040 cGy in 28 sessions (no boost)  . Shortness of breath    with exertion    PAST SURGICAL HISTORY: Past Surgical History:  Procedure Laterality Date  . AUGMENTATION MAMMAPLASTY    . BREAST ENHANCEMENT SURGERY  2008  . BREAST LUMPECTOMY Right 2016  . BREAST SURGERY     Biopsy  . COLONOSCOPY    . DILATION AND CURETTAGE OF UTERUS    . EYE SURGERY     Lasik  . PORT-A-CATH REMOVAL N/A 02/01/2015   Procedure: REMOVAL PORT-A-CATH;  Surgeon: Excell Seltzer, MD;  Location: [place];  Service: General;  Laterality: N/A;  . PORTACATH PLACEMENT N/A 08/13/2014   Procedure: INSERTION PORT-A-CATH;  Surgeon: Excell Seltzer, MD;  Location: [place];  Service:  General;  Laterality: N/A;  . TONSILECTOMY/ADENOIDECTOMY WITH MYRINGOTOMY    . TUBAL LIGATION      FAMILY HISTORY: Family History  Problem Relation Age of Onset  . Psoriasis Mother   . Pulmonary embolism Mother   . Hypertension Father   . Glaucoma Father   . Heart attack Father   . Healthy Sister   . Healthy Sister   . Thyroid disease Maternal Grandmother   . Asthma Paternal Grandmother   . Colon polyps Neg Hx   . Esophageal cancer Neg Hx   . Rectal cancer Neg Hx   . Stomach cancer Neg Hx     SOCIAL HISTORY: Social History   Socioeconomic History  . Marital status: Married    Spouse name: [name]  . Number of children: 2  . Years of education: Not on file  . Highest education level: High  school graduate  Occupational History  . Occupation: Glass blower/designer    Comment: [employer]  . Financial resource strain: Not on file  . Food insecurity:    Worry: Not on file    Inability: Not on file  . Transportation needs:    Medical: Not on file    Non-medical: Not on file  Tobacco Use  . Smoking status: Never Smoker  . Smokeless tobacco: Never Used  Substance and Sexual Activity  . Alcohol use: No  . Drug use: No  . Sexual activity: Not Currently  Lifestyle  . Physical activity:    Days per week: Not on file    Minutes per session: Not on file  . Stress: Not on file  Relationships  . Social connections:    Talks on phone: Not on file    Gets together: Not on file    Attends religious service: Not on file    Active member of club or organization: Not on file    Attends meetings of clubs or organizations: Not on file    Relationship status: Not on file  . Intimate partner violence:    Fear of current or ex partner: Not on file    Emotionally abused: Not on file    Physically abused: Not on file    Forced sexual activity: Not on file  Other Topics Concern  . Not on file  Social History Narrative   Lives at home with her husband   1 cup of coffee daily.      Occasional tea.     PHYSICAL EXAM   Vitals:   01/07/19 0837  BP: (!) 148/103  Pulse: (!) 56  Resp: 18  Weight: 183 lb 8 oz (83.2 kg)  Height: 5\' 1"  (1.549 m)    Not recorded      Body mass index is 34.67 kg/m.  PHYSICAL EXAMNIATION:  Gen: NAD, conversant, well nourised, obese, well groomed                     Cardiovascular: Regular rate rhythm, no peripheral edema, warm, nontender. Eyes: Conjunctivae clear without exudates or hemorrhage Neck: Supple, no carotid bruits. Pulmonary: Clear to auscultation bilaterally   NEUROLOGICAL EXAM:  MMSE - Mini Mental State Exam 01/07/2019  Orientation to time 5  Orientation to Place 5  Registration 3  Attention/ Calculation 5  Recall 2  Language- name 2 objects 2  Language- repeat 1  Language- follow 3 step command 3  Language- read & follow direction 1  Write a sentence 1  Copy design 1  Total score 29   Animal naming 11   CRANIAL NERVES: CN II: Visual fields are full to confrontation. Fundoscopic exam is normal with sharp discs and no vascular changes. Pupils are round equal and briskly reactive to light. CN III, IV, VI: extraocular movement are normal. No ptosis. CN V: Facial sensation is intact to pinprick in all 3 divisions bilaterally. Corneal responses are intact.  CN VII: Face is symmetric with normal eye closure and smile. CN VIII: Hearing is normal to rubbing fingers CN IX, X: Palate elevates symmetrically. Phonation is normal. CN XI: Head turning and shoulder shrug are intact CN XII: Tongue is midline with normal movements and no atrophy.  MOTOR: There is no pronator drift of out-stretched arms. Muscle bulk and tone are normal. Muscle strength is normal.  REFLEXES: Reflexes are 2+ and symmetric at the biceps, triceps, knees, and ankles. Plantar responses are  flexor.  SENSORY: Intact to light touch, pinprick, positional sensation and vibratory sensation are intact in fingers and  toes.  COORDINATION: Rapid alternating movements and fine finger movements are intact. There is no dysmetria on finger-to-nose and heel-knee-shin.    GAIT/STANCE: Posture is normal. Gait is steady with normal steps, base, arm swing, and turning. Heel and toe walking are normal. Tandem gait is normal.  Romberg is absent.   DIAGNOSTIC DATA (LABS, IMAGING, TESTING) - I reviewed patient records, labs, notes, testing and imaging myself where available.   ASSESSMENT AND PLAN  Melissa Garza is a 63 y.o. female   Memory loss  In the setting of multiple medication changes, worsening depression  Family history of dementia,  Complete evaluation with MRI of the brain     Marcial Pacas, M.D. Ph.D.  [facility] [address] [place],  [address] Ph: [phone] Fax: [phone]  CC: Midge Minium, MD, Chucky May, MD

## 2019-01-09 ENCOUNTER — Encounter: Payer: Self-pay | Admitting: Neurology

## 2019-01-12 ENCOUNTER — Telehealth: Payer: Self-pay | Admitting: Neurology

## 2019-01-12 NOTE — Telephone Encounter (Signed)
Patient returned my call she is scheduled for 01/13/19 at [facility].

## 2019-01-12 NOTE — Telephone Encounter (Signed)
lvm for pt to call back about scheduling mri  [employer] auth: NPR via [facility]

## 2019-01-13 ENCOUNTER — Ambulatory Visit: Payer: 59

## 2019-01-13 DIAGNOSIS — R413 Other amnesia: Secondary | ICD-10-CM | POA: Diagnosis not present

## 2019-01-14 ENCOUNTER — Telehealth: Payer: Self-pay | Admitting: Neurology

## 2019-01-14 NOTE — Telephone Encounter (Signed)
I have spoken with the patient and she is aware of her MRI results.  She has also been notified of the recommendations of BP goals, cholesterol goals, increase water intake and to start aspirin 81mg  daily.  She wrote the information down and read it back to me correctly.  She verbalized understanding to follow up with her PCP for close monitoring of BP and cholesterol.  Her PCP is Dr. Annye Asa who will have access to results through Epic.  The patient has a pending appt w/ her on 02/06/2019.  The patient would also like her MRI results to be shared with Dr. Chucky May (referring physician).  They have been faxed and confirmed to Dr. Starleen Arms attention.

## 2019-01-14 NOTE — Telephone Encounter (Signed)
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  No acute abnormalities are noted.

## 2019-01-30 ENCOUNTER — Encounter: Payer: Self-pay | Admitting: Family Medicine

## 2019-01-30 ENCOUNTER — Other Ambulatory Visit: Payer: Self-pay

## 2019-01-30 ENCOUNTER — Ambulatory Visit (INDEPENDENT_AMBULATORY_CARE_PROVIDER_SITE_OTHER): Payer: 59 | Admitting: Family Medicine

## 2019-01-30 VITALS — BP 126/91 | Temp 96.6°F | Ht 61.0 in | Wt 180.0 lb

## 2019-01-30 DIAGNOSIS — F419 Anxiety disorder, unspecified: Secondary | ICD-10-CM

## 2019-01-30 DIAGNOSIS — R413 Other amnesia: Secondary | ICD-10-CM

## 2019-01-30 DIAGNOSIS — F32A Depression, unspecified: Secondary | ICD-10-CM

## 2019-01-30 DIAGNOSIS — F329 Major depressive disorder, single episode, unspecified: Secondary | ICD-10-CM

## 2019-01-30 DIAGNOSIS — I10 Essential (primary) hypertension: Secondary | ICD-10-CM

## 2019-01-30 DIAGNOSIS — E7800 Pure hypercholesterolemia, unspecified: Secondary | ICD-10-CM

## 2019-01-30 MED ORDER — METOPROLOL SUCCINATE ER 25 MG PO TB24: 25.0000 mg | ORAL_TABLET | Freq: Every day | ORAL | 1 refills | Status: DC

## 2019-01-30 NOTE — Progress Notes (Signed)
Virtual Visit via Telephone Note  I connected with Melissa Garza on 01/30/19 at  2:00 PM EDT by telephone and verified that I am speaking with the correct person using two identifiers.   I discussed the limitations, risks, security and privacy concerns of performing an evaluation and management service by telephone and the availability of in person appointments. I also discussed with the patient that there may be a patient responsible charge related to this service. The patient expressed understanding and agreed to proceed.  Pt is at home and I am in the office  History of Present Illness: Pt reports she was having memory issues and ended up getting terminated from work.  Psychiatrist recommended she see Neurology.  She had MRI that showed remote internal capsule lacunar infarct and microvascular changes.  Neurology recommended tight BP control, lipid control, and daily ASA.  Not currently on BP medication.  Home BP today was 126/91.  Has been running 130s/80s.  No CP, some exertional SOB but 'I don't do anything'.  HAs 'are not as bad but i'm having them every day'.  Hyperlipidemia- pt is on Crestor 10mg  daily.  Denies abd pain, N/V.  No regular exercise, not following a particular diet.   Observations/Objective: AAOx3, NAD Pt is able to speak clearly, coherently without shortness of breath or increased work of breathing. Thought process is linear.  Mood is appropriate.   Assessment and Plan: HTN- pt has had elevated SBP and DBP and her recent MRI indicates remote lacunar infarct.  Neuro wants better BP control.  Will start low dose Metoprolol to improve BP, tachycardia (hx of this), anxiety, and HAs.  Pt expressed understanding and is in agreement w/ plan.   Anxiety/Depression- ongoing issue.  Following w/ psychiatry who recommended neuro workup for memory issues.  Will add metoprolol to help w/ anxiety.  Hyperlipidemia- chronic problem.  On Crestor.  Due for repeat labs.  Will adjust  meds as needed.  Memory loss- suspect this is more a function of her ongoing anxiety and depression but will defer to neuro and psych.  Follow Up Instructions: Lab visit scheduled for Monday   I discussed the assessment and treatment plan with the patient. The patient was provided an opportunity to ask questions and all were answered. The patient agreed with the plan and demonstrated an understanding of the instructions.   The patient was advised to call back or seek an in-person evaluation if the symptoms worsen or if the condition fails to improve as anticipated.  I provided 23 minutes of non-face-to-face time during this encounter.   Annye Asa, MD

## 2019-01-30 NOTE — Progress Notes (Signed)
I have discussed the procedure for the virtual visit with the patient who has given consent to proceed with assessment and treatment.   Lamberto Dinapoli, CMA     

## 2019-02-02 ENCOUNTER — Ambulatory Visit: Payer: 59 | Admitting: Podiatry

## 2019-02-02 ENCOUNTER — Other Ambulatory Visit (INDEPENDENT_AMBULATORY_CARE_PROVIDER_SITE_OTHER): Payer: 59

## 2019-02-02 DIAGNOSIS — I10 Essential (primary) hypertension: Secondary | ICD-10-CM | POA: Diagnosis not present

## 2019-02-02 DIAGNOSIS — E7800 Pure hypercholesterolemia, unspecified: Secondary | ICD-10-CM

## 2019-02-02 LAB — CBC WITH DIFFERENTIAL/PLATELET
Basophils Absolute: 0 10*3/uL (ref 0.0–0.1)
Basophils Relative: 0.5 % (ref 0.0–3.0)
Eosinophils Absolute: 0.3 10*3/uL (ref 0.0–0.7)
Eosinophils Relative: 4.6 % (ref 0.0–5.0)
HCT: 40.6 % (ref 36.0–46.0)
Hemoglobin: 13.5 g/dL (ref 12.0–15.0)
Lymphocytes Relative: 30.2 % (ref 12.0–46.0)
Lymphs Abs: 2.1 10*3/uL (ref 0.7–4.0)
MCHC: 33.2 g/dL (ref 30.0–36.0)
MCV: 96.6 fl (ref 78.0–100.0)
Monocytes Absolute: 0.4 10*3/uL (ref 0.1–1.0)
Monocytes Relative: 6.1 % (ref 3.0–12.0)
Neutro Abs: 4.2 10*3/uL (ref 1.4–7.7)
Neutrophils Relative %: 58.6 % (ref 43.0–77.0)
Platelets: 249 10*3/uL (ref 150.0–400.0)
RBC: 4.21 Mil/uL (ref 3.87–5.11)
RDW: 13.3 % (ref 11.5–15.5)
WBC: 7.1 10*3/uL (ref 4.0–10.5)

## 2019-02-02 LAB — BASIC METABOLIC PANEL
BUN: 15 mg/dL (ref 6–23)
CO2: 25 meq/L (ref 19–32)
Calcium: 9.2 mg/dL (ref 8.4–10.5)
Chloride: 108 mEq/L (ref 96–112)
Creatinine, Ser: 1.11 mg/dL (ref 0.40–1.20)
GFR: 49.6 mL/min — ABNORMAL LOW (ref >60.00)
Glucose, Bld: 89 mg/dL (ref 70–99)
Potassium: 4.1 mEq/L (ref 3.5–5.1)
Sodium: 140 mEq/L (ref 135–145)

## 2019-02-02 LAB — HEPATIC FUNCTION PANEL
ALT: 12 U/L (ref 0–35)
AST: 13 U/L (ref 0–37)
Albumin: 3.9 g/dL (ref 3.5–5.2)
Alkaline Phosphatase: 100 U/L (ref 39–117)
Bilirubin, Direct: 0.1 mg/dL (ref 0.0–0.3)
Total Bilirubin: 0.3 mg/dL (ref 0.2–1.2)
Total Protein: 6.2 g/dL (ref 6.0–8.3)

## 2019-02-02 LAB — LIPID PANEL
CHOL/HDL RATIO: 3
Cholesterol: 147 mg/dL (ref 0–200)
HDL: 52 mg/dL (ref >39.00)
LDL Cholesterol: 72 mg/dL (ref 0–99)
NonHDL: 94.53
Triglycerides: 113 mg/dL (ref 0.0–149.0)
VLDL: 22.6 mg/dL (ref 0.0–40.0)

## 2019-02-02 LAB — TSH: TSH: 1.48 u[IU]/mL (ref 0.35–4.50)

## 2019-02-06 ENCOUNTER — Encounter: Payer: 59 | Admitting: Family Medicine

## 2019-03-03 ENCOUNTER — Telehealth: Payer: Self-pay | Admitting: Family Medicine

## 2019-03-03 NOTE — Telephone Encounter (Signed)
Copied from [place] [id number]. Topic: Quick Communication - Rx Refill/Question >> Mar 03, 2019  3:09 PM Mcneil, Ja-Kwan wrote: Medication: Vitamin D, Ergocalciferol, (DRISDOL) 1.25 MG (50000 UT) CAPS capsule  Has the patient contacted their pharmacy? no  Preferred Pharmacy (with phone number or street name): Vitamin D, Ergocalciferol, (DRISDOL) 1.25 MG (50000 UT) CAPS capsule  Agent: Please be advised that RX refills may take up to 3 business days. We ask that you follow-up with your pharmacy.

## 2019-03-04 NOTE — Telephone Encounter (Signed)
Spoke with patient and advised that the rx for Vitamin D was for 12 weeks then once finished to continue the OTC vitamin d. Patient is agreeable to that.

## 2019-04-08 ENCOUNTER — Telehealth: Payer: Self-pay | Admitting: Family Medicine

## 2019-04-08 MED ORDER — ROSUVASTATIN CALCIUM 10 MG PO TABS: 10.0000 mg | ORAL_TABLET | Freq: Every day | ORAL | 1 refills | Status: DC

## 2019-04-08 NOTE — Telephone Encounter (Signed)
Copied from [place] [id number]. Topic: Quick Communication - Rx Refill/Question >> Apr 08, 2019  2:45 PM Carolyn Stare wrote: Medication rosuvastatin (CRESTOR) 10 MG tablet   Preferred Pharmacy [facility]   Agent: Please be advised that RX refills may take up to 3 business days. We ask that you follow-up with your pharmacy.

## 2019-04-08 NOTE — Telephone Encounter (Signed)
Medication filled to pharmacy as requested.   

## 2019-07-16 ENCOUNTER — Other Ambulatory Visit: Payer: Self-pay | Admitting: Family Medicine

## 2019-08-10 ENCOUNTER — Telehealth: Payer: Self-pay | Admitting: Oncology

## 2019-08-10 NOTE — Telephone Encounter (Signed)
Returned patient's phone call regarding cancelling an appointment, per patient's request appointment has been cancelled. Patient will call back when ready to reschedule. °

## 2019-08-17 ENCOUNTER — Ambulatory Visit: Payer: 59 | Admitting: Oncology

## 2019-08-17 ENCOUNTER — Other Ambulatory Visit: Payer: 59

## 2019-10-31 ENCOUNTER — Other Ambulatory Visit: Payer: Self-pay | Admitting: Family Medicine

## 2019-12-03 ENCOUNTER — Other Ambulatory Visit: Payer: Self-pay | Admitting: Family Medicine

## 2019-12-09 ENCOUNTER — Ambulatory Visit (INDEPENDENT_AMBULATORY_CARE_PROVIDER_SITE_OTHER): Payer: 59 | Admitting: Family Medicine

## 2019-12-09 ENCOUNTER — Encounter: Payer: Self-pay | Admitting: Family Medicine

## 2019-12-09 ENCOUNTER — Other Ambulatory Visit: Payer: Self-pay

## 2019-12-09 VITALS — BP 118/76 | HR 81 | Temp 98.5°F | Resp 16 | Ht 61.0 in | Wt 166.0 lb

## 2019-12-09 DIAGNOSIS — Z0000 Encounter for general adult medical examination without abnormal findings: Secondary | ICD-10-CM

## 2019-12-09 DIAGNOSIS — I10 Essential (primary) hypertension: Secondary | ICD-10-CM | POA: Diagnosis not present

## 2019-12-09 DIAGNOSIS — E538 Deficiency of other specified B group vitamins: Secondary | ICD-10-CM | POA: Diagnosis not present

## 2019-12-09 DIAGNOSIS — E785 Hyperlipidemia, unspecified: Secondary | ICD-10-CM | POA: Insufficient documentation

## 2019-12-09 DIAGNOSIS — E559 Vitamin D deficiency, unspecified: Secondary | ICD-10-CM

## 2019-12-09 NOTE — Patient Instructions (Signed)
Follow up in 6 months to recheck BP and cholesterol We'll notify you of your lab results and make any changes if needed Continue to work on healthy diet and regular exercise- you can do it!! Call with any questions or concerns Stay Safe!  Stay Healthy! 

## 2019-12-09 NOTE — Assessment & Plan Note (Signed)
Chronic problem.  Tolerating statin w/o difficulty.  Check labs.  Adjust meds prn  

## 2019-12-09 NOTE — Assessment & Plan Note (Signed)
Pt w/ hx of Vit D deficiency.  Check labs and replete prn.

## 2019-12-09 NOTE — Assessment & Plan Note (Signed)
Pt w/ hx of B12 deficiency.  Check labs and replete prn.

## 2019-12-09 NOTE — Progress Notes (Signed)
   Subjective:    Patient ID: Melissa Garza, female    DOB: [birth year], 64 y.o.   MRN: [id number]  HPI CPE- UTD on mammo, Tdap, pap, colonoscopy, immunizations.  Pt is down 14 lbs   Review of Systems Patient reports no vision/ hearing changes, adenopathy,fever, persistant/recurrent hoarseness , swallowing issues, chest pain, palpitations, edema, persistant/recurrent cough, hemoptysis, dyspnea (rest/exertional/paroxysmal nocturnal), gastrointestinal bleeding (melena, rectal bleeding), abdominal pain, significant heartburn, bowel changes, GU symptoms (dysuria, hematuria, incontinence), Gyn symptoms (abnormal  bleeding, pain),  syncope, focal weakness, memory loss, numbness & tingling, skin/hair/nail changes, abnormal bruising or bleeding, anxiety, or depression.   This visit occurred during the SARS-CoV-2 public health emergency.  Safety protocols were in place, including screening questions prior to the visit, additional usage of staff PPE, and extensive cleaning of exam room while observing appropriate contact time as indicated for disinfecting solutions.       Objective:   Physical Exam General Appearance:    Alert, cooperative, no distress, appears stated age  Head:    Normocephalic, without obvious abnormality, atraumatic  Eyes:    PERRL, conjunctiva/corneas clear, EOM's intact, fundi    benign, both eyes  Ears:    Normal TM's and external ear canals, both ears  Nose:   Deferred due to COVID  Throat:   Neck:   Supple, symmetrical, trachea midline, no adenopathy;    Thyroid: no enlargement/tenderness/nodules  Back:     Symmetric, no curvature, ROM normal, no CVA tenderness  Lungs:     Clear to auscultation bilaterally, respirations unlabored  Chest Wall:    No tenderness or deformity   Heart:    Regular rate and rhythm, S1 and S2 normal, no murmur, rub   or gallop  Breast Exam:    Deferred to GYN  Abdomen:     Soft, non-tender, bowel sounds active all four quadrants,    no  masses, no organomegaly  Genitalia:    Deferred to GYN  Rectal:    Extremities:   Extremities normal, atraumatic, no cyanosis or edema  Pulses:   2+ and symmetric all extremities  Skin:   Skin color, texture, turgor normal, no rashes or lesions  Lymph nodes:   Cervical, supraclavicular, and axillary nodes normal  Neurologic:   CNII-XII intact, normal strength, sensation and reflexes    throughout          Assessment & Plan:

## 2019-12-09 NOTE — Assessment & Plan Note (Signed)
Pt's PE WNL w/ exception of obesity.  UTD on pap, mammo, colonoscopy, immunizations.  Check labs.  Anticipatory guidance provided.

## 2019-12-09 NOTE — Assessment & Plan Note (Signed)
chronci problem.  Well controlled.  Asymptomatic.  Check labs. No anticipated med changes.  Will follow.

## 2019-12-10 LAB — CBC WITH DIFFERENTIAL/PLATELET
Basophils Absolute: 0.1 10*3/uL (ref 0.0–0.1)
Basophils Relative: 0.9 % (ref 0.0–3.0)
Eosinophils Absolute: 0.3 10*3/uL (ref 0.0–0.7)
Eosinophils Relative: 3.8 % (ref 0.0–5.0)
HCT: 40.3 % (ref 36.0–46.0)
Hemoglobin: 13.6 g/dL (ref 12.0–15.0)
Lymphocytes Relative: 27 % (ref 12.0–46.0)
Lymphs Abs: 1.9 10*3/uL (ref 0.7–4.0)
MCHC: 33.7 g/dL (ref 30.0–36.0)
MCV: 97.1 fl (ref 78.0–100.0)
Monocytes Absolute: 0.4 10*3/uL (ref 0.1–1.0)
Monocytes Relative: 5.3 % (ref 3.0–12.0)
Neutro Abs: 4.5 10*3/uL (ref 1.4–7.7)
Neutrophils Relative %: 63 % (ref 43.0–77.0)
Platelets: 227 10*3/uL (ref 150.0–400.0)
RBC: 4.15 Mil/uL (ref 3.87–5.11)
RDW: 13.5 % (ref 11.5–15.5)
WBC: 7.2 10*3/uL (ref 4.0–10.5)

## 2019-12-10 LAB — LIPID PANEL
Cholesterol: 165 mg/dL (ref 0–200)
HDL: 50.9 mg/dL (ref >39.00)
NonHDL: 113.92
Total CHOL/HDL Ratio: 3
Triglycerides: 237 mg/dL — ABNORMAL HIGH (ref 0.0–149.0)
VLDL: 47.4 mg/dL — ABNORMAL HIGH (ref 0.0–40.0)

## 2019-12-10 LAB — HEPATIC FUNCTION PANEL
ALT: 14 U/L (ref 0–35)
AST: 13 U/L (ref 0–37)
Albumin: 4 g/dL (ref 3.5–5.2)
Alkaline Phosphatase: 117 U/L (ref 39–117)
Bilirubin, Direct: 0.1 mg/dL (ref 0.0–0.3)
Total Bilirubin: 0.3 mg/dL (ref 0.2–1.2)
Total Protein: 6.5 g/dL (ref 6.0–8.3)

## 2019-12-10 LAB — BASIC METABOLIC PANEL
BUN: 15 mg/dL (ref 6–23)
CO2: 26 mEq/L (ref 19–32)
Calcium: 9.1 mg/dL (ref 8.4–10.5)
Chloride: 106 mEq/L (ref 96–112)
Creatinine, Ser: 1.21 mg/dL — ABNORMAL HIGH (ref 0.40–1.20)
GFR: 44.78 mL/min — ABNORMAL LOW (ref >60.00)
Glucose, Bld: 83 mg/dL (ref 70–99)
Potassium: 4.1 mEq/L (ref 3.5–5.1)
Sodium: 139 mEq/L (ref 135–145)

## 2019-12-10 LAB — TSH: TSH: 1.32 u[IU]/mL (ref 0.35–4.50)

## 2019-12-10 LAB — LDL CHOLESTEROL, DIRECT: Direct LDL: 84 mg/dL

## 2019-12-10 LAB — VITAMIN D 25 HYDROXY (VIT D DEFICIENCY, FRACTURES): VITD: 44.8 ng/mL (ref 30.00–100.00)

## 2019-12-10 LAB — VITAMIN B12: Vitamin B-12: 238 pg/mL (ref 211–911)

## 2019-12-11 ENCOUNTER — Other Ambulatory Visit: Payer: Self-pay | Admitting: Family Medicine

## 2019-12-11 DIAGNOSIS — R7989 Other specified abnormal findings of blood chemistry: Secondary | ICD-10-CM

## 2019-12-18 ENCOUNTER — Ambulatory Visit (INDEPENDENT_AMBULATORY_CARE_PROVIDER_SITE_OTHER): Payer: 59 | Admitting: *Deleted

## 2019-12-18 ENCOUNTER — Other Ambulatory Visit: Payer: Self-pay

## 2019-12-18 ENCOUNTER — Encounter: Payer: Self-pay | Admitting: General Practice

## 2019-12-18 DIAGNOSIS — R7989 Other specified abnormal findings of blood chemistry: Secondary | ICD-10-CM | POA: Diagnosis not present

## 2019-12-18 LAB — BASIC METABOLIC PANEL
BUN: 14 mg/dL (ref 6–23)
CO2: 28 mEq/L (ref 19–32)
Calcium: 8.8 mg/dL (ref 8.4–10.5)
Chloride: 108 mEq/L (ref 96–112)
Creatinine, Ser: 0.96 mg/dL (ref 0.40–1.20)
GFR: 58.48 mL/min — ABNORMAL LOW (ref >60.00)
Glucose, Bld: 91 mg/dL (ref 70–99)
Potassium: 3.4 mEq/L — ABNORMAL LOW (ref 3.5–5.1)
Sodium: 141 mEq/L (ref 135–145)

## 2019-12-24 ENCOUNTER — Encounter: Payer: Self-pay | Admitting: Family Medicine

## 2019-12-25 ENCOUNTER — Other Ambulatory Visit: Payer: Self-pay | Admitting: General Practice

## 2019-12-25 MED ORDER — ROSUVASTATIN CALCIUM 10 MG PO TABS: 10.0000 mg | ORAL_TABLET | Freq: Every day | ORAL | 1 refills | Status: DC

## 2019-12-25 MED ORDER — METOPROLOL SUCCINATE ER 25 MG PO TB24: 25.0000 mg | ORAL_TABLET | Freq: Every day | ORAL | 1 refills | Status: DC

## 2020-02-02 ENCOUNTER — Telehealth: Payer: Self-pay | Admitting: Neurology

## 2020-02-02 MED ORDER — TOPIRAMATE 100 MG PO TABS: 100.0000 mg | ORAL_TABLET | Freq: Every day | ORAL | 11 refills | Status: DC

## 2020-02-02 MED ORDER — RIZATRIPTAN BENZOATE 10 MG PO TABS: ORAL_TABLET | ORAL | 11 refills | Status: DC

## 2020-02-02 NOTE — Telephone Encounter (Signed)
I spoke to the patient and she needs her topiramate and rizatriptan refills sent to [facility] on [address] [facility] [address] [facility]. Her pharmacy has been updated and the refills have been sent.

## 2020-02-02 NOTE — Addendum Note (Signed)
Addended by: Noberto Retort C on: 02/02/2020 12:10 PM   Modules accepted: Orders

## 2020-02-02 NOTE — Telephone Encounter (Signed)
Pt has called to inform she ran out of topiramate (TOPAMAX) 100 MG tablet 1 day last week.  Pt is asking if she needs to stay on this medication, please call

## 2020-03-09 ENCOUNTER — Other Ambulatory Visit: Payer: Self-pay

## 2020-03-09 ENCOUNTER — Encounter: Payer: Self-pay | Admitting: Family Medicine

## 2020-03-09 ENCOUNTER — Telehealth (INDEPENDENT_AMBULATORY_CARE_PROVIDER_SITE_OTHER): Payer: 59 | Admitting: Family Medicine

## 2020-03-09 DIAGNOSIS — R059 Cough, unspecified: Secondary | ICD-10-CM

## 2020-03-09 DIAGNOSIS — R05 Cough: Secondary | ICD-10-CM | POA: Diagnosis not present

## 2020-03-09 MED ORDER — FLUTICASONE PROPIONATE 50 MCG/ACT NA SUSP: 2.0000 | Freq: Every day | NASAL | 6 refills | Status: DC

## 2020-03-09 MED ORDER — ALBUTEROL SULFATE HFA 108 (90 BASE) MCG/ACT IN AERS: 2.0000 | INHALATION_SPRAY | Freq: Four times a day (QID) | RESPIRATORY_TRACT | 1 refills | Status: DC | PRN

## 2020-03-09 NOTE — Progress Notes (Signed)
Virtual Visit via Video   I connected with patient on 03/09/20 at  1:30 PM EDT by a video enabled telemedicine application and verified that I am speaking with the correct person using two identifiers.  Location patient: Home Location provider: Acupuncturist, Office Persons participating in the virtual visit: Patient, Provider, [place] (Jess B)  I discussed the limitations of evaluation and management by telemedicine and the availability of in person appointments. The patient expressed understanding and agreed to proceed.  Interactive audio and video telecommunications were attempted between this provider and patient, however failed, due to patient having technical difficulties OR patient did not have access to video capability.  We continued and completed visit with audio only.   Subjective:   HPI:   Cough- 'i've had it for a long time but it's getting worse'.  Monday coughed 'so hard' she developed a headache and had to go home and lie down.  Pt reports 'it feels like (shaved) coconut in my throat'.  Others have commented on the cough.  Will wake at night coughing.  Some PND.  Taking Claritin daily.  Pt reports cough predated seasonal allergies this year.  No GERD sxs.  Pt denies SOB w/ cough, no wheezing.  Reports sxs are more in throat than in chest.  No fevers.  No particular trigger to the cough.  Husband looked in throat w/ flashlight- no film or white patches, just red.  ROS:   See pertinent positives and negatives per HPI.  Patient Active Problem List   Diagnosis Date Noted  . Hyperlipidemia 12/09/2019  . HTN (hypertension) 01/30/2019  . Memory loss 01/07/2019  . Chronic migraine 01/07/2019  . Back pain 10/09/2018  . SOB (shortness of breath) on exertion 10/09/2018  . Vitamin D deficiency 11/21/2017  . Physical exam 11/20/2017  . Hx of breast cancer 11/07/2016  . Anemia in neoplastic disease 01/11/2015  . Hot flashes 11/02/2014  . Constipation 10/05/2014  . Breast  cancer of upper-inner quadrant of right female breast ([place]) 08/06/2014  . B12 deficiency 06/15/2009  . PANIC DISORDER 06/15/2009  . Anxiety and depression 06/15/2009  . HIATAL HERNIA 06/15/2009  . IRRITABLE BOWEL SYNDROME 06/15/2009  . Headache 06/15/2009  . ALLERGY 06/15/2009    Social History   Tobacco Use  . Smoking status: Never Smoker  . Smokeless tobacco: Never Used  Substance Use Topics  . Alcohol use: No    Current Outpatient Medications:  .  ALPRAZolam (XANAX) 1 MG tablet, Take 1 mg by mouth 4 (four) times daily as needed for anxiety., Disp: , Rfl:  .  aspirin 81 MG tablet, Take 81 mg by mouth daily., Disp: , Rfl:  .  brimonidine-timolol (COMBIGAN) 0.2-0.5 % ophthalmic solution, Place 1 drop into the right eye 2 (two) times daily., Disp: , Rfl:  .  Fluocinolone Acetonide Scalp 0.01 % OIL, SMARTSIG:1 Sparingly Topical Every Night PRN, Disp: , Rfl:  .  hydrocortisone 2.5 % lotion, Apply topically 2 (two) times daily., Disp: , Rfl:  .  metoprolol succinate (TOPROL-XL) 25 MG 24 hr tablet, Take 1 tablet (25 mg total) by mouth daily., Disp: 180 tablet, Rfl: 1 .  rizatriptan (MAXALT) 10 MG tablet, Take 1 tab at onset of migraine.  May repeat in 2 hrs, if needed.  Max dose: 2 tabs/day. This is a 30 day prescription., Disp: 10 tablet, Rfl: 11 .  rosuvastatin (CRESTOR) 10 MG tablet, Take 1 tablet (10 mg total) by mouth daily., Disp: 90 tablet, Rfl: 1 .  topiramate (TOPAMAX) 100 MG tablet, Take 1 tablet (100 mg total) by mouth at bedtime., Disp: 30 tablet, Rfl: 11 .  travoprost, benzalkonium, (TRAVATAN) 0.004 % ophthalmic solution, Place 1 drop into both eyes at bedtime. , Disp: , Rfl:  .  triamcinolone cream (KENALOG) 0.1 %, APPLY A SMALL AMOUNT TO AFFECTED AREA(S) TWO TIMES A DAY, Disp: , Rfl:  .  Vitamin D, Ergocalciferol, (DRISDOL) 1.25 MG (50000 UT) CAPS capsule, Take 1 capsule (50,000 Units total) by mouth every 7 (seven) days., Disp: 12 capsule, Rfl: 0 .  zolpidem (AMBIEN) 10 MG  tablet, Take 10 mg by mouth at bedtime as needed for sleep., Disp: , Rfl:   Allergies  Allergen Reactions  . Hydrocodone Hives and Itching    Objective:   There were no vitals taken for this visit.  Pt is able to speak clearly, coherently without shortness of breath or increased work of breathing.  Thought process is linear.  Mood is appropriate.   Assessment and Plan:   Cough- new to provider, ongoing for pt.  Likely multifactorial.  Suspect PND as main culprit but may have silent reflux as well.  Will start Flonase to decrease PND.  Discussed acid reducer but pt is not interested as she denies sxs.  Will start Albuterol prn for coughing fits.  If no improvement, pt will let me know and we will refer to ENT.  Annye Asa, MD 03/09/2020  Time spent with the patient: 12 minutes, of which >50% was spent in obtaining information about symptoms, reviewing previous labs, evaluations, and treatments, counseling about condition (please see the discussed topics above), and developing a plan to further investigate it; had a number of questions which I addressed.

## 2020-03-09 NOTE — Progress Notes (Signed)
I have discussed the procedure for the virtual visit with the patient who has given consent to proceed with assessment and treatment.   Pt unable to obtain vitals.   Prisilla Kocsis L Christena Sunderlin, CMA     

## 2020-03-23 ENCOUNTER — Other Ambulatory Visit: Payer: Self-pay | Admitting: Adult Health

## 2020-03-23 DIAGNOSIS — Z853 Personal history of malignant neoplasm of breast: Secondary | ICD-10-CM

## 2020-04-06 NOTE — Telephone Encounter (Signed)
error 

## 2020-06-13 ENCOUNTER — Other Ambulatory Visit: Payer: Self-pay

## 2020-06-13 ENCOUNTER — Encounter: Payer: Self-pay | Admitting: Physician Assistant

## 2020-06-13 ENCOUNTER — Ambulatory Visit: Payer: 59 | Admitting: Physician Assistant

## 2020-06-13 VITALS — BP 112/80 | HR 93 | Temp 98.2°F | Resp 16 | Ht 61.0 in | Wt 168.0 lb

## 2020-06-13 DIAGNOSIS — L247 Irritant contact dermatitis due to plants, except food: Secondary | ICD-10-CM | POA: Diagnosis not present

## 2020-06-13 MED ORDER — CEPHALEXIN 500 MG PO CAPS: 500.0000 mg | ORAL_CAPSULE | Freq: Two times a day (BID) | ORAL | 0 refills | Status: AC

## 2020-06-13 MED ORDER — PREDNISONE 10 MG PO TABS: ORAL_TABLET | ORAL | 0 refills | Status: DC

## 2020-06-13 MED ORDER — TRIAMCINOLONE ACETONIDE 0.1 % EX CREA: TOPICAL_CREAM | CUTANEOUS | 0 refills | Status: DC

## 2020-06-13 NOTE — Patient Instructions (Signed)
Please start the steroid taper as directed. Also start the antibiotic daily as directed. The steroid cream can be applied up to twice daily but no more. Get some OTC witch hazel astringent to apply to the areas to help them dry up.  Please let us know if symptoms are not resolving.

## 2020-06-13 NOTE — Progress Notes (Signed)
Patient presents to clinic today c/o pruritic rash first noted last Wednesday/Thursday.  Notes initially the rash was located on her left shin and just below the knee.  Since then has spread to bilateral upper and lower extremities.  Notes the rash on her lower left extremity is severe with an area of significant redness and tenderness.  Denies fever, chills.  Denies any shortness of breath or difficulty swallowing.  Notes symptoms started after she was doing some weeding in the yard.  Notes exposure to rhus plants.   Past Medical History:  Diagnosis Date  . Anxiety   . Cancer [facility]    cancer of right breast  . Cataracts, bilateral 2018  . Depression   . Environmental allergies 2007  . GERD (gastroesophageal reflux disease)    PMH  . Glaucoma   . Glaucoma   . Headache    chronic  . Hepatitis    Hx: of Hepatitis not sure which one  . Hot flashes   . Personal history of chemotherapy   . Personal history of radiation therapy   . PONV (postoperative nausea and vomiting)   . S/P radiation therapy 03/01/2015 through 06/03/201604/26/2016 through 04/08/2015     Right breast 5040 cGy in 28 sessions (no boost)  . Shortness of breath    with exertion    Current Outpatient Medications on File Prior to Visit  Medication Sig Dispense Refill  . albuterol (VENTOLIN HFA) 108 (90 Base) MCG/ACT inhaler Inhale 2 puffs into the lungs every 6 (six) hours as needed for wheezing or shortness of breath. 18 g 1  . ALPRAZolam (XANAX) 1 MG tablet Take 1 mg by mouth 4 (four) times daily as needed for anxiety.    Marland Kitchen aspirin 81 MG tablet Take 81 mg by mouth daily.    . brimonidine-timolol (COMBIGAN) 0.2-0.5 % ophthalmic solution Place 1 drop into the right eye 2 (two) times daily.    . Fluocinolone Acetonide Scalp 0.01 % OIL SMARTSIG:1 Sparingly Topical Every Night PRN    . fluticasone (FLONASE) 50 MCG/ACT  nasal spray Place 2 sprays into both nostrils daily. 16 g 6  . hydrocortisone 2.5 % lotion Apply topically 2 (two) times daily.    . metoprolol succinate (TOPROL-XL) 25 MG 24 hr tablet Take 1 tablet (25 mg total) by mouth daily. 180 tablet 1  . rizatriptan (MAXALT) 10 MG tablet Take 1 tab at onset of migraine.  May repeat in 2 hrs, if needed.  Max dose: 2 tabs/day. This is a 30 day prescription. 10 tablet 11  . rosuvastatin (CRESTOR) 10 MG tablet Take 1 tablet (10 mg total) by mouth daily. 90 tablet 1  . topiramate (TOPAMAX) 100 MG tablet Take 1 tablet (100 mg total) by mouth at bedtime. 30 tablet 11  . travoprost, benzalkonium, (TRAVATAN) 0.004 % ophthalmic solution Place 1 drop into both eyes at bedtime.     . triamcinolone cream (KENALOG) 0.1 % APPLY A SMALL AMOUNT TO AFFECTED AREA(S) TWO TIMES A DAY    . Vitamin D, Ergocalciferol, (DRISDOL) 1.25 MG (50000 UT) CAPS capsule Take 1 capsule (50,000 Units total) by mouth every 7 (seven) days. 12 capsule 0  . zolpidem (AMBIEN) 10 MG tablet Take 10 mg by mouth at bedtime as needed for sleep.     No current facility-administered medications on file prior to visit.    Allergies  Allergen Reactions  . Hydrocodone Hives and Itching    Family History  Problem Relation Age of Onset  .  Psoriasis Mother   . Pulmonary embolism Mother   . Hypertension Father   . Glaucoma Father   . Heart attack Father   . Healthy Sister   . Healthy Sister   . Thyroid disease Maternal Grandmother   . Asthma Paternal Grandmother   . Colon polyps Neg Hx   . Esophageal cancer Neg Hx   . Rectal cancer Neg Hx   . Stomach cancer Neg Hx     Social History   Socioeconomic History  . Marital status: Married    Spouse name: [name]  . Number of children: 2  . Years of education: Not on file  . Highest education level: High school graduate  Occupational History  . Occupation: Glass blower/designer    Comment: [employer]  Tobacco Use  . Smoking status: Never Smoker  .  Smokeless tobacco: Never Used  Vaping Use  . Vaping Use: Never used  Substance and Sexual Activity  . Alcohol use: No  . Drug use: No  . Sexual activity: Not Currently  Other Topics Concern  . Not on file  Social History Narrative   Lives at home with her husband   1 cup of coffee daily.     Occasional tea.   Social Determinants of Health   Financial Resource Strain:   . Difficulty of Paying Living Expenses:   Food Insecurity:   . Worried About Charity fundraiser in the Last Year:   . Arboriculturist in the Last Year:   Transportation Needs:   . Film/video editor (Medical):   Marland Kitchen Lack of Transportation (Non-Medical):   Physical Activity:   . Days of Exercise per Week:   . Minutes of Exercise per Session:   Stress:   . Feeling of Stress :   Social Connections:   . Frequency of Communication with Friends and Family:   . Frequency of Social Gatherings with Friends and Family:   . Attends Religious Services:   . Active Member of Clubs or Organizations:   . Attends Archivist Meetings:   Marland Kitchen Marital Status:    Review of Systems - See HPI.  All other ROS are negative.  BP 112/80   Pulse 93   Temp 98.2 F (36.8 C) (Temporal)   Resp 16   Ht 5\' 1"  (1.549 m)   Wt 168 lb (76.2 kg)   SpO2 98%   BMI 31.74 kg/m   Physical Exam Vitals reviewed.  Constitutional:      Appearance: Normal appearance.  HENT:     Head: Normocephalic and atraumatic.  Cardiovascular:     Rate and Rhythm: Normal rate and regular rhythm.  Pulmonary:     Effort: Pulmonary effort is normal.  Musculoskeletal:     Cervical back: Neck supple.  Skin:      Neurological:     General: No focal deficit present.     Mental Status: She is alert and oriented to person, place, and time.     Assessment/Plan: 1. Irritant contact dermatitis due to plants, except food Bilateral upper and lower extremities.  Most severe on left lower extremity with some evidence of mild cellulitis beginning.   Proper skin care reviewed with patient.  Recommend she apply some over-the-counter witch hazel astringent to the areas.  Rx Kenalog cream to apply twice daily.  Rx Keflex 500 mg twice daily for cellulitis.  Giving how significant her dermatitis is we will add on 12-day oral prednisone taper.  Strict return precautions reviewed  with patient.  Patient voiced understanding and agreement with plan.  This visit occurred during the SARS-CoV-2 public health emergency.  Safety protocols were in place, including screening questions prior to the visit, additional usage of staff PPE, and extensive cleaning of exam room while observing appropriate contact time as indicated for disinfecting solutions.     Leeanne Rio, PA-C

## 2020-08-19 ENCOUNTER — Other Ambulatory Visit: Payer: Self-pay | Admitting: Family Medicine

## 2020-11-07 ENCOUNTER — Other Ambulatory Visit: Payer: Self-pay

## 2020-11-07 MED ORDER — METOPROLOL SUCCINATE ER 25 MG PO TB24: 25.0000 mg | ORAL_TABLET | Freq: Every day | ORAL | 1 refills | Status: DC

## 2020-11-07 MED ORDER — ROSUVASTATIN CALCIUM 10 MG PO TABS: 10.0000 mg | ORAL_TABLET | Freq: Every day | ORAL | 1 refills | Status: DC

## 2021-01-31 ENCOUNTER — Other Ambulatory Visit: Payer: Self-pay

## 2021-01-31 DIAGNOSIS — E785 Hyperlipidemia, unspecified: Secondary | ICD-10-CM

## 2021-01-31 DIAGNOSIS — I10 Essential (primary) hypertension: Secondary | ICD-10-CM

## 2021-01-31 MED ORDER — METOPROLOL SUCCINATE ER 25 MG PO TB24: 25.0000 mg | ORAL_TABLET | Freq: Every day | ORAL | 0 refills | Status: DC

## 2021-01-31 MED ORDER — ROSUVASTATIN CALCIUM 10 MG PO TABS: 10.0000 mg | ORAL_TABLET | Freq: Every day | ORAL | 0 refills | Status: DC

## 2021-03-01 ENCOUNTER — Encounter: Payer: Self-pay | Admitting: Family Medicine

## 2021-03-01 ENCOUNTER — Other Ambulatory Visit: Payer: Self-pay

## 2021-03-01 ENCOUNTER — Ambulatory Visit (INDEPENDENT_AMBULATORY_CARE_PROVIDER_SITE_OTHER): Payer: 59 | Admitting: Family Medicine

## 2021-03-01 VITALS — BP 128/80 | HR 69 | Temp 97.7°F | Resp 18 | Ht 62.0 in | Wt 178.0 lb

## 2021-03-01 DIAGNOSIS — E559 Vitamin D deficiency, unspecified: Secondary | ICD-10-CM | POA: Diagnosis not present

## 2021-03-01 DIAGNOSIS — Z23 Encounter for immunization: Secondary | ICD-10-CM

## 2021-03-01 DIAGNOSIS — I10 Essential (primary) hypertension: Secondary | ICD-10-CM | POA: Diagnosis not present

## 2021-03-01 DIAGNOSIS — E538 Deficiency of other specified B group vitamins: Secondary | ICD-10-CM

## 2021-03-01 DIAGNOSIS — G43709 Chronic migraine without aura, not intractable, without status migrainosus: Secondary | ICD-10-CM

## 2021-03-01 DIAGNOSIS — F909 Attention-deficit hyperactivity disorder, unspecified type: Secondary | ICD-10-CM | POA: Insufficient documentation

## 2021-03-01 DIAGNOSIS — L400 Psoriasis vulgaris: Secondary | ICD-10-CM | POA: Insufficient documentation

## 2021-03-01 DIAGNOSIS — Z0000 Encounter for general adult medical examination without abnormal findings: Secondary | ICD-10-CM | POA: Diagnosis not present

## 2021-03-01 LAB — CBC WITH DIFFERENTIAL/PLATELET
Basophils Absolute: 0.1 10*3/uL (ref 0.0–0.1)
Basophils Relative: 0.8 % (ref 0.0–3.0)
Eosinophils Absolute: 0.4 10*3/uL (ref 0.0–0.7)
Eosinophils Relative: 5.5 % — ABNORMAL HIGH (ref 0.0–5.0)
HCT: 38.4 % (ref 36.0–46.0)
Hemoglobin: 12.7 g/dL (ref 12.0–15.0)
Lymphocytes Relative: 34.6 % (ref 12.0–46.0)
Lymphs Abs: 2.2 10*3/uL (ref 0.7–4.0)
MCHC: 33 g/dL (ref 30.0–36.0)
MCV: 95.9 fl (ref 78.0–100.0)
Monocytes Absolute: 0.4 10*3/uL (ref 0.1–1.0)
Monocytes Relative: 6.2 % (ref 3.0–12.0)
Neutro Abs: 3.4 10*3/uL (ref 1.4–7.7)
Neutrophils Relative %: 52.9 % (ref 43.0–77.0)
Platelets: 200 10*3/uL (ref 150.0–400.0)
RBC: 4 Mil/uL (ref 3.87–5.11)
RDW: 13.2 % (ref 11.5–15.5)
WBC: 6.5 10*3/uL (ref 4.0–10.5)

## 2021-03-01 LAB — VITAMIN D 25 HYDROXY (VIT D DEFICIENCY, FRACTURES): VITD: 72.78 ng/mL (ref 30.00–100.00)

## 2021-03-01 LAB — LIPID PANEL
Cholesterol: 157 mg/dL (ref 0–200)
HDL: 62.8 mg/dL (ref >39.00)
LDL Cholesterol: 75 mg/dL (ref 0–99)
NonHDL: 93.75
Total CHOL/HDL Ratio: 2
Triglycerides: 96 mg/dL (ref 0.0–149.0)
VLDL: 19.2 mg/dL (ref 0.0–40.0)

## 2021-03-01 LAB — BASIC METABOLIC PANEL
BUN: 11 mg/dL (ref 6–23)
CO2: 29 mEq/L (ref 19–32)
Calcium: 9.1 mg/dL (ref 8.4–10.5)
Chloride: 102 mEq/L (ref 96–112)
Creatinine, Ser: 1.04 mg/dL (ref 0.40–1.20)
GFR: 56.46 mL/min — ABNORMAL LOW (ref >60.00)
Glucose, Bld: 85 mg/dL (ref 70–99)
Potassium: 4.1 mEq/L (ref 3.5–5.1)
Sodium: 138 mEq/L (ref 135–145)

## 2021-03-01 LAB — HEPATIC FUNCTION PANEL
ALT: 25 U/L (ref 0–35)
AST: 21 U/L (ref 0–37)
Albumin: 4 g/dL (ref 3.5–5.2)
Alkaline Phosphatase: 109 U/L (ref 39–117)
Bilirubin, Direct: 0.1 mg/dL (ref 0.0–0.3)
Total Bilirubin: 0.4 mg/dL (ref 0.2–1.2)
Total Protein: 6.4 g/dL (ref 6.0–8.3)

## 2021-03-01 LAB — VITAMIN B12: Vitamin B-12: >1506 pg/mL — ABNORMAL HIGH (ref 211–911)

## 2021-03-01 LAB — TSH: TSH: 1.14 u[IU]/mL (ref 0.35–4.50)

## 2021-03-01 MED ORDER — TOPIRAMATE 100 MG PO TABS: 100.0000 mg | ORAL_TABLET | Freq: Every day | ORAL | 3 refills | Status: DC

## 2021-03-01 NOTE — Progress Notes (Signed)
Subjective:    Patient ID: Melissa Garza, female    DOB: 12/07/1955, 65 y.o.   MRN: [id number]  HPI Here today for CPE.  Risk Factors: HTN- chronic problem, on Metoprolol XL 25mg  daily w/ adequate control Hyperlipidemia- chronic problem, on Crestor 10mg  daily Migraine- pt is asking for refill on Topamax b/c she has not been to neurology recently Physical Activity: some yard work but no regular exercise Fall Risk: low Depression: pt reports mood is currently well controlled on her medication.  Pt is asking me to assume responsibility for Celexa, Alprazolam, and Trazodone b/c psych doesn't accept her insurance Hearing: normal to conversational tones and whispered voice ADL's: independent Cognitive: normal linear thought process, memory and attention intact Home Safety: safe at home, lives w/ husband Height, Weight, BMI, Visual Acuity: see vitals, vision screen done in office-  Counseling: UTD on colonoscopy.  Due for mammo (scheduled at GYN) and DEXA.  Due for Prevnar. Healthcare POA/Living will: pt has this in place Substance use- nonsmoker, doesn't drink, no drugs Opiate risk- no opiate use Labs Ordered: See A&P Care Plan: See A&P   Reviewed past medical, surgical, family and social histories.   Patient Care Team    Relationship Specialty Notifications Start End  Midge Minium, MD PCP - General Family Medicine  08/04/18   Excell Seltzer, MD (Inactive) Consulting Physician General Surgery  08/06/14   Magrinat, Virgie Dad, MD Consulting Physician Oncology  08/06/14   Arloa Koh, MD (Inactive) Consulting Physician Radiation Oncology  08/06/14   Sanjuana Kava, MD Referring Physician Obstetrics and Gynecology  08/04/18   Mauri Pole, MD Consulting Physician Gastroenterology  08/04/18   Boyd Kerbs, MD Referring Physician Specialist  08/04/18   Chucky May, MD Consulting Physician Psychiatry  01/07/19      Health Maintenance  Topic Date Due  . COVID-19  Vaccine (3 - Pfizer risk 4-dose series) 03/07/2020  . DEXA SCAN  Never done  . PNA vac Low Risk Adult (1 of 2 - PCV13) Never done  . MAMMOGRAM  01/01/2021  . INFLUENZA VACCINE  06/05/2021  . TETANUS/TDAP  12/25/2021  . PAP SMEAR-Modifier  01/01/2022  . COLONOSCOPY (Pts 45-20yrs Insurance coverage will need to be confirmed)  01/12/2027  . Hepatitis C Screening  Completed  . HIV Screening  Completed  . HPV VACCINES  Aged Out      Review of Systems Patient reports no vision/hearing changes, adenopathy,fever, persistant/recurrent hoarseness , swallowing issues, chest pain, palpitations, edema, persistant/recurrent cough, hemoptysis, dyspnea (rest/exertional/paroxysmal nocturnal), gastrointestinal bleeding (melena, rectal bleeding), abdominal pain, significant heartburn, bowel changes, GU symptoms (dysuria, hematuria, incontinence), Gyn symptoms (abnormal  bleeding, pain),  syncope, focal weakness, memory loss, skin/hair/nail changes, abnormal bruising or bleeding, anxiety, or depression.   + 10 lb weight gain + numbness of finger tips due to psoriasis medication  This visit occurred during the SARS-CoV-2 public health emergency.  Safety protocols were in place, including screening questions prior to the visit, additional usage of staff PPE, and extensive cleaning of exam room while observing appropriate contact time as indicated for disinfecting solutions.       Objective:   Physical Exam General Appearance:    Alert, cooperative, no distress, appears stated age  Head:    Normocephalic, without obvious abnormality, atraumatic  Eyes:    PERRL, conjunctiva/corneas clear, EOM's intact, fundi    benign, both eyes  Ears:    Normal TM's and external ear canals, both ears  Nose:   Deferred due  to COVID  Throat:   Neck:   Supple, symmetrical, trachea midline, no adenopathy;    Thyroid: no enlargement/tenderness/nodules  Back:     Symmetric, no curvature, ROM normal, no CVA tenderness  Lungs:      Clear to auscultation bilaterally, respirations unlabored  Chest Wall:    No tenderness or deformity   Heart:    Regular rate and rhythm, S1 and S2 normal, no murmur, rub   or gallop  Breast Exam:    Deferred to mammo  Abdomen:     Soft, non-tender, bowel sounds active all four quadrants,    no masses, no organomegaly  Genitalia:    DeferredGYN  Rectal:    Extremities:   Extremities normal, atraumatic, no cyanosis or edema  Pulses:   2+ and symmetric all extremities  Skin:   Skin color, texture, turgor normal, no rashes or lesions  Lymph nodes:   Cervical, supraclavicular, and axillary nodes normal  Neurologic:   CNII-XII intact, normal strength, sensation and reflexes    throughout          Assessment & Plan:

## 2021-03-01 NOTE — Patient Instructions (Addendum)
Follow up in 6 months to recheck BP and cholesterol We'll notify you of your lab results and make any changes if needed Continue to work on healthy diet and regular exercise- you can do it! Please have GYN send me a copy of your mammogram Let me know when you need refills on the Citalopram, Alprazolam, and Trazodone You are up to date on your colonoscopy until 2028!  Phebe Colla! Call with any questions or concerns Happy Mother's Day!!!   Preventive Care 66 Years and Older, Female Preventive care refers to lifestyle choices and visits with your health care provider that can promote health and wellness. This includes:  A yearly physical exam. This is also called an annual wellness visit.  Regular dental and eye exams.  Immunizations.  Screening for certain conditions.  Healthy lifestyle choices, such as: ? Eating a healthy diet. ? Getting regular exercise. ? Not using drugs or products that contain nicotine and tobacco. ? Limiting alcohol use. What can I expect for my preventive care visit? Physical exam Your health care provider will check your:  Height and weight. These may be used to calculate your BMI (body mass index). BMI is a measurement that tells if you are at a healthy weight.  Heart rate and blood pressure.  Body temperature.  Skin for abnormal spots. Counseling Your health care provider may ask you questions about your:  Past medical problems.  Family's medical history.  Alcohol, tobacco, and drug use.  Emotional well-being.  Home life and relationship well-being.  Sexual activity.  Diet, exercise, and sleep habits.  History of falls.  Memory and ability to understand (cognition).  Work and work Statistician.  Pregnancy and menstrual history.  Access to firearms. What immunizations do I need? Vaccines are usually given at various ages, according to a schedule. Your health care provider will recommend vaccines for you based on your age, medical  history, and lifestyle or other factors, such as travel or where you work.   What tests do I need? Blood tests  Lipid and cholesterol levels. These may be checked every 5 years, or more often depending on your overall health.  Hepatitis C test.  Hepatitis B test. Screening  Lung cancer screening. You may have this screening every year starting at age 20 if you have a 30-pack-year history of smoking and currently smoke or have quit within the past 15 years.  Colorectal cancer screening. ? All adults should have this screening starting at age 54 and continuing until age 38. ? Your health care provider may recommend screening at age 10 if you are at increased risk. ? You will have tests every 1-10 years, depending on your results and the type of screening test.  Diabetes screening. ? This is done by checking your blood sugar (glucose) after you have not eaten for a while (fasting). ? You may have this done every 1-3 years.  Mammogram. ? This may be done every 1-2 years. ? Talk with your health care provider about how often you should have regular mammograms.  Abdominal aortic aneurysm (AAA) screening. You may need this if you are a current or former smoker.  BRCA-related cancer screening. This may be done if you have a family history of breast, ovarian, tubal, or peritoneal cancers. Other tests  STD (sexually transmitted disease) testing, if you are at risk.  Bone density scan. This is done to screen for osteoporosis. You may have this done starting at age 89. Talk with your health care provider about  your test results, treatment options, and if necessary, the need for more tests. Follow these instructions at home: Eating and drinking  Eat a diet that includes fresh fruits and vegetables, whole grains, lean protein, and low-fat dairy products. Limit your intake of foods with high amounts of sugar, saturated fats, and salt.  Take vitamin and mineral supplements as recommended by  your health care provider.  Do not drink alcohol if your health care provider tells you not to drink.  If you drink alcohol: ? Limit how much you have to 0-1 drink a day. ? Be aware of how much alcohol is in your drink. In the [place] one drink equals one 12 oz bottle of beer (355 mL), one 5 oz glass of wine (148 mL), or one 1 oz glass of hard liquor (44 mL).   Lifestyle  Take daily care of your teeth and gums. Brush your teeth every morning and night with fluoride toothpaste. Floss one time each day.  Stay active. Exercise for at least 30 minutes 5 or more days each week.  Do not use any products that contain nicotine or tobacco, such as cigarettes, e-cigarettes, and chewing tobacco. If you need help quitting, ask your health care provider.  Do not use drugs.  If you are sexually active, practice safe sex. Use a condom or other form of protection in order to prevent STIs (sexually transmitted infections).  Talk with your health care provider about taking a low-dose aspirin or statin.  Find healthy ways to cope with stress, such as: ? Meditation, yoga, or listening to music. ? Journaling. ? Talking to a trusted person. ? Spending time with friends and family. Safety  Always wear your seat belt while driving or riding in a vehicle.  Do not drive: ? If you have been drinking alcohol. Do not ride with someone who has been drinking. ? When you are tired or distracted. ? While texting.  Wear a helmet and other protective equipment during sports activities.  If you have firearms in your house, make sure you follow all gun safety procedures. What's next?  Visit your health care provider once a year for an annual wellness visit.  Ask your health care provider how often you should have your eyes and teeth checked.  Stay up to date on all vaccines. This information is not intended to replace advice given to you by your health care provider. Make sure you discuss any questions you  have with your health care provider. Document Revised: 10/12/2020 Document Reviewed: 10/16/2018 Elsevier Patient Education  2021 [employer].

## 2021-03-14 NOTE — Assessment & Plan Note (Signed)
Check labs and replete prn. 

## 2021-03-14 NOTE — Assessment & Plan Note (Signed)
Chronic problem.  Adequate control.  Currently asymptomatic.

## 2021-03-14 NOTE — Assessment & Plan Note (Signed)
Pt this time will assume responsibility for Topamax.

## 2021-03-14 NOTE — Assessment & Plan Note (Signed)
Pt's PE unchanged from previous and WNL w/ exception of obesity.  UTD on colonoscopy, pt reports mammo is scheduled w/ GYN (she will also ask about DEXA).  Prevnar given today.  Check labs to risk stratify.  Reviewed health maintenance schedule w/ pt.  Encouraged healthy diet and regular exercise.

## 2021-04-17 ENCOUNTER — Telehealth (INDEPENDENT_AMBULATORY_CARE_PROVIDER_SITE_OTHER): Payer: 59 | Admitting: Family Medicine

## 2021-04-17 ENCOUNTER — Encounter: Payer: Self-pay | Admitting: Family Medicine

## 2021-04-17 DIAGNOSIS — J329 Chronic sinusitis, unspecified: Secondary | ICD-10-CM | POA: Diagnosis not present

## 2021-04-17 DIAGNOSIS — B9689 Other specified bacterial agents as the cause of diseases classified elsewhere: Secondary | ICD-10-CM

## 2021-04-17 DIAGNOSIS — Z20822 Contact with and (suspected) exposure to covid-19: Secondary | ICD-10-CM | POA: Diagnosis not present

## 2021-04-17 MED ORDER — AMOXICILLIN 875 MG PO TABS: 875.0000 mg | ORAL_TABLET | Freq: Two times a day (BID) | ORAL | 0 refills | Status: AC

## 2021-04-17 NOTE — Progress Notes (Signed)
Virtual Visit via Video   I connected with patient on 04/17/21 at 11:30 AM EDT by a video enabled telemedicine application and verified that I am speaking with the correct person using two identifiers.  Location patient: Home Location provider: [facility], Office Persons participating in the virtual visit: Patient, Provider, [place] (Sabrina M)  I discussed the limitations of evaluation and management by telemedicine and the availability of in person appointments. The patient expressed understanding and agreed to proceed.  Subjective:   HPI:   'i think i'm having sinus stuff'- sxs started 3-4 weeks ago but improved.  Sxs have been constant for the last 2 weeks.  + daily HA.  Pressure around my nose, eyes.  + congestion.  Sxs are worse in [facility] but better outside.  No fevers, body aches.  Minimal cough.  No known sick contacts.  Has not tested for COVID.  Pt reports this feels similar to previous sinus infxns.0  ROS:   See pertinent positives and negatives per HPI.  Patient Active Problem List   Diagnosis Date Noted   Plaque psoriasis 03/01/2021   ADHD (attention deficit hyperactivity disorder) 03/01/2021   Hyperlipidemia 12/09/2019   HTN (hypertension) 01/30/2019   Memory loss 01/07/2019   Chronic migraine without aura 01/07/2019   Back pain 10/09/2018   SOB (shortness of breath) on exertion 10/09/2018   Vitamin D deficiency 11/21/2017   Physical exam 11/20/2017   Anemia in neoplastic disease 01/11/2015   Hot flashes 11/02/2014   Constipation 10/05/2014   History of cancer of right breast 08/06/2014   B12 deficiency 06/15/2009   PANIC DISORDER 06/15/2009   Anxiety and depression 06/15/2009   HIATAL HERNIA 06/15/2009   IRRITABLE BOWEL SYNDROME 06/15/2009   Headache 06/15/2009   ALLERGY 06/15/2009    Social History   Tobacco Use   Smoking status: Never   Smokeless tobacco: Never  Substance Use Topics   Alcohol use: No    Current Outpatient Medications:     ALPRAZolam (XANAX) 1 MG tablet, Take 1 mg by mouth 4 (four) times daily as needed for anxiety., Disp: , Rfl:    aspirin 81 MG tablet, Take 81 mg by mouth daily., Disp: , Rfl:    brimonidine-timolol (COMBIGAN) 0.2-0.5 % ophthalmic solution, Place 1 drop into the right eye 2 (two) times daily., Disp: , Rfl:    citalopram (CELEXA) 40 MG tablet, Take 40 mg by mouth daily., Disp: , Rfl:    fluticasone (FLONASE) 50 MCG/ACT nasal spray, Place 2 sprays into both nostrils daily., Disp: 16 g, Rfl: 6   metoprolol succinate (TOPROL-XL) 25 MG 24 hr tablet, Take 1 tablet (25 mg total) by mouth daily., Disp: 90 tablet, Rfl: 0   Risankizumab-rzaa (SKYRIZI PEN) 150 MG/ML SOAJ, See admin instructions., Disp: , Rfl:    rosuvastatin (CRESTOR) 10 MG tablet, Take 1 tablet (10 mg total) by mouth daily., Disp: 90 tablet, Rfl: 0   topiramate (TOPAMAX) 100 MG tablet, Take 1 tablet (100 mg total) by mouth at bedtime., Disp: 90 tablet, Rfl: 3   travoprost, benzalkonium, (TRAVATAN) 0.004 % ophthalmic solution, Place 1 drop into both eyes at bedtime. , Disp: , Rfl:    traZODone (DESYREL) 50 MG tablet, Take 50 mg by mouth at bedtime., Disp: , Rfl:    Fluocinolone Acetonide Scalp 0.01 % OIL, SMARTSIG:1 Sparingly Topical Every Night PRN, Disp: , Rfl:    triamcinolone cream (KENALOG) 0.1 %, APPLY A SMALL AMOUNT TO AFFECTED AREA(S) TWO TIMES A DAY (Patient not taking:  Reported on 04/17/2021), Disp: 30 g, Rfl: 0  Allergies  Allergen Reactions   Hydrocodone Hives and Itching    Objective:   There were no vitals taken for this visit. AAOx3, NAD NCAT, EOMI No obvious CN deficits Coloring WNL Pt is able to speak clearly, coherently without shortness of breath or increased work of breathing.  Thought process is linear.  Mood is appropriate.   Assessment and Plan:   Bacterial sinusitis- new.  Given duration of sxs, it is definitely within the realm of possibility that she has a bacterial infxn.  Start Amoxicillin BID and  monitor for improvement.  Possible COVID- new.  Reviewed sxs w/ pt and encouraged her to test for COVID as she has not yet done so.  Told her that while this may not make much of a difference in her sxs or treatment, it could impact those around her.  She is willing to do a home test and will keep me updated.  Annye Asa, MD 04/17/2021

## 2021-04-17 NOTE — Progress Notes (Signed)
I connected with  Melissa Garza on 04/17/21 by a video enabled telemedicine application and verified that I am speaking with the correct person using two identifiers.   I discussed the limitations of evaluation and management by telemedicine. The patient expressed understanding and agreed to proceed.

## 2021-05-03 ENCOUNTER — Encounter: Payer: Self-pay | Admitting: *Deleted

## 2021-06-17 ENCOUNTER — Other Ambulatory Visit: Payer: Self-pay | Admitting: Family Medicine

## 2021-06-17 DIAGNOSIS — E785 Hyperlipidemia, unspecified: Secondary | ICD-10-CM

## 2021-06-17 DIAGNOSIS — I10 Essential (primary) hypertension: Secondary | ICD-10-CM

## 2021-06-28 LAB — HM MAMMOGRAPHY

## 2021-06-28 LAB — HM DEXA SCAN

## 2021-08-31 ENCOUNTER — Encounter: Payer: Self-pay | Admitting: Family Medicine

## 2021-08-31 ENCOUNTER — Other Ambulatory Visit: Payer: Self-pay

## 2021-08-31 ENCOUNTER — Ambulatory Visit (INDEPENDENT_AMBULATORY_CARE_PROVIDER_SITE_OTHER): Payer: 59 | Admitting: Family Medicine

## 2021-08-31 VITALS — BP 120/84 | HR 69 | Temp 98.7°F | Resp 19 | Ht 62.0 in | Wt 177.6 lb

## 2021-08-31 DIAGNOSIS — F32A Depression, unspecified: Secondary | ICD-10-CM

## 2021-08-31 DIAGNOSIS — I10 Essential (primary) hypertension: Secondary | ICD-10-CM | POA: Diagnosis not present

## 2021-08-31 DIAGNOSIS — E785 Hyperlipidemia, unspecified: Secondary | ICD-10-CM

## 2021-08-31 DIAGNOSIS — F419 Anxiety disorder, unspecified: Secondary | ICD-10-CM | POA: Diagnosis not present

## 2021-08-31 DIAGNOSIS — Z23 Encounter for immunization: Secondary | ICD-10-CM | POA: Diagnosis not present

## 2021-08-31 DIAGNOSIS — H9193 Unspecified hearing loss, bilateral: Secondary | ICD-10-CM | POA: Diagnosis not present

## 2021-08-31 LAB — BASIC METABOLIC PANEL
BUN: 18 mg/dL (ref 6–23)
CO2: 26 mEq/L (ref 19–32)
Calcium: 8.7 mg/dL (ref 8.4–10.5)
Chloride: 106 mEq/L (ref 96–112)
Creatinine, Ser: 1.02 mg/dL (ref 0.40–1.20)
GFR: 57.58 mL/min — ABNORMAL LOW (ref >60.00)
Glucose, Bld: 87 mg/dL (ref 70–99)
Potassium: 4.1 mEq/L (ref 3.5–5.1)
Sodium: 139 mEq/L (ref 135–145)

## 2021-08-31 LAB — CBC WITH DIFFERENTIAL/PLATELET
Basophils Absolute: 0 10*3/uL (ref 0.0–0.1)
Basophils Relative: 0.8 % (ref 0.0–3.0)
Eosinophils Absolute: 0.2 10*3/uL (ref 0.0–0.7)
Eosinophils Relative: 3.5 % (ref 0.0–5.0)
HCT: 40.2 % (ref 36.0–46.0)
Hemoglobin: 13.2 g/dL (ref 12.0–15.0)
Lymphocytes Relative: 30.9 % (ref 12.0–46.0)
Lymphs Abs: 1.9 10*3/uL (ref 0.7–4.0)
MCHC: 32.9 g/dL (ref 30.0–36.0)
MCV: 95.8 fl (ref 78.0–100.0)
Monocytes Absolute: 0.3 10*3/uL (ref 0.1–1.0)
Monocytes Relative: 5.6 % (ref 3.0–12.0)
Neutro Abs: 3.6 10*3/uL (ref 1.4–7.7)
Neutrophils Relative %: 59.2 % (ref 43.0–77.0)
Platelets: 212 10*3/uL (ref 150.0–400.0)
RBC: 4.19 Mil/uL (ref 3.87–5.11)
RDW: 13.7 % (ref 11.5–15.5)
WBC: 6.1 10*3/uL (ref 4.0–10.5)

## 2021-08-31 LAB — HEPATIC FUNCTION PANEL
ALT: 11 U/L (ref 0–35)
AST: 12 U/L (ref 0–37)
Albumin: 4 g/dL (ref 3.5–5.2)
Alkaline Phosphatase: 99 U/L (ref 39–117)
Bilirubin, Direct: 0.1 mg/dL (ref 0.0–0.3)
Total Bilirubin: 0.3 mg/dL (ref 0.2–1.2)
Total Protein: 6.4 g/dL (ref 6.0–8.3)

## 2021-08-31 LAB — LIPID PANEL
Cholesterol: 169 mg/dL (ref 0–200)
HDL: 54.5 mg/dL (ref >39.00)
LDL Cholesterol: 96 mg/dL (ref 0–99)
NonHDL: 114.76
Total CHOL/HDL Ratio: 3
Triglycerides: 96 mg/dL (ref 0.0–149.0)
VLDL: 19.2 mg/dL (ref 0.0–40.0)

## 2021-08-31 LAB — TSH: TSH: 1.18 u[IU]/mL (ref 0.35–5.50)

## 2021-08-31 MED ORDER — TRAZODONE HCL 50 MG PO TABS: 50.0000 mg | ORAL_TABLET | Freq: Every evening | ORAL | 1 refills | Status: DC | PRN

## 2021-08-31 MED ORDER — TRAZODONE HCL 50 MG PO TABS: 50.0000 mg | ORAL_TABLET | Freq: Every day | ORAL | 0 refills | Status: DC

## 2021-08-31 NOTE — Patient Instructions (Signed)
Schedule your complete physical in 6 months We'll notify you of your lab results and make any changes if needed Continue to work on healthy diet and regular exercise- you can do it! We'll call you with your audiology appt to check your hearing Once the osteoporosis medication is figured out, let me know and we can add the Wellbutrin Call with any questions or concerns Stay Safe!  Stay Healthy! Happy Halloween!

## 2021-08-31 NOTE — Progress Notes (Signed)
   Subjective:    Patient ID: Melissa Garza, female    DOB: [birth year], 65 y.o.   MRN: [id number]  HPI HTN- chronic problem, on Metoprolol XL 25mg  daily w/ good control.  Denies CP, SOB.  + mild HAs.  No edema.  Hyperlipidemia- chronic problem, on Crestor 10mg  daily.  No abd pain, N/V  Anxiety/Depression- ongoing issue for pt.  Currently on Citalopram.  'it's not good.  It's not horrible'.  Needs to restart Trazodone for sleep.  Stays very isolated- only talks to mom and sister.  'I know this isn't how it's supposed to be but I almost don't care'.    Hearing loss- pt has to turn up the TV and is still not able to decipher the words.  Difficulty in conversations   Review of Systems For ROS see HPI   This visit occurred during the SARS-CoV-2 public health emergency.  Safety protocols were in place, including screening questions prior to the visit, additional usage of staff PPE, and extensive cleaning of exam room while observing appropriate contact time as indicated for disinfecting solutions.      Objective:   Physical Exam Vitals reviewed.  Constitutional:      General: She is not in acute distress.    Appearance: Normal appearance. She is well-developed. She is not ill-appearing.  HENT:     Head: Normocephalic and atraumatic.  Eyes:     Conjunctiva/sclera: Conjunctivae normal.     Pupils: Pupils are equal, round, and reactive to light.  Neck:     Thyroid: No thyromegaly.  Cardiovascular:     Rate and Rhythm: Normal rate and regular rhythm.     Pulses: Normal pulses.     Heart sounds: Normal heart sounds. No murmur heard. Pulmonary:     Effort: Pulmonary effort is normal. No respiratory distress.     Breath sounds: Normal breath sounds.  Abdominal:     General: There is no distension.     Palpations: Abdomen is soft.     Tenderness: There is no abdominal tenderness.  Musculoskeletal:     Cervical back: Normal range of motion and neck supple.     Right lower leg: No  edema.     Left lower leg: No edema.  Lymphadenopathy:     Cervical: No cervical adenopathy.  Skin:    General: Skin is warm and dry.  Neurological:     Mental Status: She is alert and oriented to person, place, and time.  Psychiatric:        Mood and Affect: Mood normal.        Behavior: Behavior normal.          Assessment & Plan:  Hearing loss- refer to audiology for complete evaluation

## 2021-09-03 NOTE — Assessment & Plan Note (Signed)
Chronic problem.  Well controlled.  Currently asymptomatic.  No med changes at this time. 

## 2021-09-03 NOTE — Assessment & Plan Note (Signed)
Chronic problem.  On Crestor 10mg daily w/o difficulty.  Check labs.  Adjust meds prn  

## 2021-09-03 NOTE — Assessment & Plan Note (Signed)
Ongoing issue for pt and not well controlled.  She is hesitant to make medication changes b/c she remembers when she first started medication she felt like 'a science experiment' with all the changes.  Tolerating Citalopram w/o difficulty but mood is not where she would like it to be.  She is in the process of changing her osteoporosis medication and doesn't want to add anything until that is figured out.  Once that medication is started and things are stable, encouraged her to reach out so we can start low dose Wellbutrin.  Pt expressed understanding and is in agreement w/ plan.

## 2021-09-13 ENCOUNTER — Other Ambulatory Visit: Payer: Self-pay

## 2021-09-13 ENCOUNTER — Ambulatory Visit: Payer: MEDICARE | Attending: Family Medicine | Admitting: Audiologist

## 2021-09-13 DIAGNOSIS — H6123 Impacted cerumen, bilateral: Secondary | ICD-10-CM | POA: Insufficient documentation

## 2021-09-13 DIAGNOSIS — H6121 Impacted cerumen, right ear: Secondary | ICD-10-CM | POA: Diagnosis present

## 2021-09-13 NOTE — Procedures (Signed)
  Outpatient Audiology and [place], [place]  [phone]  AUDIOLOGICAL  EVALUATION  NAME: Melissa Garza     DOB:   [birth year]      MRN: [id number]                                                                                     DATE: 09/13/2021     REFERENT: Midge Minium, MD STATUS: Outpatient DIAGNOSIS: Impacted Cerumen Right Ear, Excessive Cerumen Bilaterally     History: Melissa Garza was seen for an audiological evaluation. Melissa Garza is receiving a hearing evaluation due to concerns for difficulty hearing the last few years. Melissa Garza has difficulty hearing the television, background noise, crowds, and when people are at a distance. Melissa Garza can tell people on the TV are talking but cannot understand them. Her husband tells her the TV is too loud.This difficulty began gradually. No pain or pressure reported in either ear. Tinnitus present in both ears sounding like a static buzz. Melissa Garza has no history of excessive noise exposure. Melissa Garza has no history of genetic hearing loss.  Medical history notes clear canals on 03/01/21. No recent medical condition with hearing loss risk found on review.    Evaluation:  Otoscopy showed no view of tympanic membranes with occluding dry cerumen, bilaterally Tympanometry results were consistent with flat response in the right ear and normal middle ear function in the left ear.  Audiometric testing was deferred due to excessive cerumen in the ear canal.    Results:  The test results were reviewed with Melissa Garza. Melissa Garza has wax blocking her right ear completely. Sound is getting past the wax in the left ear but I cannot see her eardrum. Melissa Garza said Melissa Garza used to have wax removed regularly but has not in the last several years. Melissa Garza would like to have the wax removed before having a hearing test. No further testing completed. Second evaluation scheduled. Melissa Garza was given a handout on cerumen removal with a Debrox Kit.     Recommendations:  Return 10/04/21 for audiologic evaluation after using Debrox drops. Debrox Earwax Removal Drops are a safe and inexpensive in-home solution for wax removal. Debrox Earwax Removal Kit includes a soft rubber bulb syringe to rinse your ear after using Debrox Earwax Removal Drops. Excessive earwax build-up can lead to ear discomfort and reduced hearing, which can affect your day-to-day life. The kit can be purchased over the counter at [place], [place], [facility], and most other pharmacies.   How to use the Debrox Earwax Removal Drops Kit:  tilt head sideways. place 5 to 10 drops into ear. tip of applicator should not enter ear canal. keep drops in ear for several minutes by keeping head tilted or placing cotton in the ear. use twice daily for up to four days  gently flush ear with water, using soft rubber bulb syringe after final treatment (on 4th day)  Repeat for another four days due to amount of build up    [employer], Au.D., CCC-A 09/13/2021  1:38 PM  Cc: Midge Minium, MD

## 2021-10-04 ENCOUNTER — Encounter: Payer: Self-pay | Admitting: Registered Nurse

## 2021-10-04 ENCOUNTER — Other Ambulatory Visit: Payer: Self-pay

## 2021-10-04 ENCOUNTER — Ambulatory Visit (INDEPENDENT_AMBULATORY_CARE_PROVIDER_SITE_OTHER): Payer: 59 | Admitting: Registered Nurse

## 2021-10-04 ENCOUNTER — Ambulatory Visit: Payer: MEDICARE | Admitting: Audiologist

## 2021-10-04 VITALS — BP 121/88 | HR 98 | Temp 98.1°F | Resp 18 | Ht 62.0 in | Wt 178.4 lb

## 2021-10-04 DIAGNOSIS — H6123 Impacted cerumen, bilateral: Secondary | ICD-10-CM

## 2021-10-04 MED ORDER — HYDROCORTISONE-ACETIC ACID 1-2 % OT SOLN
3.0000 [drp] | Freq: Three times a day (TID) | OTIC | 0 refills | Status: DC | PRN
Start: 2021-10-04 — End: 2022-02-27

## 2021-10-04 NOTE — Patient Instructions (Addendum)
Ms. Melissa Garza to see you  Lavage went well - sending ear drops for relief from any itching or irritation.   Call if any symptoms arise, persist, or worsen.  Thank you!  Rich     If you have lab work done today you will be contacted with your lab results within the next 2 weeks.  If you have not heard from [place] then please contact us. The fastest way to get your results is to register for My Chart.   IF you received an x-ray today, you will receive an invoice from [facility]. Please contact [facility] at [phone] with questions or concerns regarding your invoice.   IF you received labwork today, you will receive an invoice from [facility]. Please contact [facility] at [phone] with questions or concerns regarding your invoice.   Our billing staff will not be able to assist you with questions regarding bills from these companies.  You will be contacted with the lab results as soon as they are available. The fastest way to get your results is to activate your My Chart account. Instructions are located on the last page of this paperwork. If you have not heard from [place] regarding the results in 2 weeks, please contact this office.

## 2021-10-04 NOTE — Progress Notes (Signed)
Established Patient Office Visit  Subjective:  Patient ID: Melissa Garza, female    DOB: [birth year]  Age: 65 y.o. MRN: [id number]  CC:  Chief Complaint  Patient presents with   Cerumen Impaction    Patient states she believes she needs to get both of her ears cleaned.    HPI Melissa Garza presents for bilateral cerumen impaction  Muffled hearing. Some pressure Tried home debrox, no relief No drainage. No upper respiratory symptoms.  Went for hearing test and was told both ears had impacted cerumen  Has had lavage in past - a few years ago - tolerated well.   Past Medical History:  Diagnosis Date   Anxiety    Cancer [facility])    cancer of right breast   Cataracts, bilateral 2018   Depression    Environmental allergies 2007   GERD (gastroesophageal reflux disease)    PMH   Glaucoma    Glaucoma    Headache    chronic   Hepatitis    Hx: of Hepatitis not sure which one   Hot flashes    Personal history of chemotherapy    Personal history of radiation therapy    PONV (postoperative nausea and vomiting)    S/P radiation therapy 03/01/2015 through 04/08/2015                                    03/01/2015 through 04/08/2015                                                                     Right breast 5040 cGy in 28 sessions (no boost)     Shortness of breath    with exertion    Past Surgical History:  Procedure Laterality Date   AUGMENTATION MAMMAPLASTY     BREAST ENHANCEMENT SURGERY  2008   BREAST LUMPECTOMY Right 2016   BREAST SURGERY     Biopsy   COLONOSCOPY     DILATION AND CURETTAGE OF UTERUS     EYE SURGERY     Lasik   PORT-A-CATH REMOVAL N/A 02/01/2015   Procedure: REMOVAL PORT-A-CATH;  Surgeon: Excell Seltzer, MD;  Location: [facility];  Service: General;  Laterality: N/A;   PORTACATH PLACEMENT N/A 08/13/2014   Procedure: INSERTION PORT-A-CATH;  Surgeon: Excell Seltzer, MD;  Location: [facility] OR;  Service: General;  Laterality:  N/A;   TONSILECTOMY/ADENOIDECTOMY WITH MYRINGOTOMY     TUBAL LIGATION      Family History  Problem Relation Age of Onset   Psoriasis Mother    Pulmonary embolism Mother    Hypertension Father    Glaucoma Father    Heart attack Father    Healthy Sister    Healthy Sister    Thyroid disease Maternal Grandmother    Asthma Paternal Grandmother    Colon polyps Neg Hx    Esophageal cancer Neg Hx    Rectal cancer Neg Hx    Stomach cancer Neg Hx     Social History   Socioeconomic History   Marital status: Married    Spouse name: [name]   Number of children: 2   Years of education: Not on file  Highest education level: High school graduate  Occupational History   Occupation: Glass blower/designer    Comment: Data processing manager  Tobacco Use   Smoking status: Never   Smokeless tobacco: Never  Vaping Use   Vaping Use: Never used  Substance and Sexual Activity   Alcohol use: No   Drug use: No   Sexual activity: Not Currently  Other Topics Concern   Not on file  Social History Narrative   Lives at home with her husband   1 cup of coffee daily.     Occasional tea.   Social Determinants of Health   Financial Resource Strain: Not on file  Food Insecurity: Not on file  Transportation Needs: Not on file  Physical Activity: Not on file  Stress: Not on file  Social Connections: Not on file  Intimate Partner Violence: Not on file    Outpatient Medications Prior to Visit  Medication Sig Dispense Refill   ALPRAZolam (XANAX) 1 MG tablet Take 1 mg by mouth 4 (four) times daily as needed for anxiety.     aspirin 81 MG tablet Take 81 mg by mouth daily.     brimonidine-timolol (COMBIGAN) 0.2-0.5 % ophthalmic solution Place 1 drop into the right eye 2 (two) times daily.     citalopram (CELEXA) 40 MG tablet Take 40 mg by mouth daily.     metoprolol succinate (TOPROL-XL) 25 MG 24 hr tablet TAKE 1 TABLET DAILY 90 tablet 3   rosuvastatin (CRESTOR) 10 MG tablet TAKE 1 TABLET DAILY 90 tablet 3    topiramate (TOPAMAX) 100 MG tablet Take 1 tablet (100 mg total) by mouth at bedtime. 90 tablet 3   travoprost, benzalkonium, (TRAVATAN) 0.004 % ophthalmic solution Place 1 drop into both eyes at bedtime.      traZODone (DESYREL) 50 MG tablet Take 1 tablet (50 mg total) by mouth at bedtime. 30 tablet 0   traZODone (DESYREL) 50 MG tablet Take 1 tablet (50 mg total) by mouth at bedtime as needed for sleep. 90 tablet 1   No facility-administered medications prior to visit.    Allergies  Allergen Reactions   Fosamax [Alendronate Sodium]     Severe abd pain, worsened GERD   Hydrocodone Hives and Itching    ROS Review of Systems  Constitutional: Negative.   HENT:  Positive for ear pain (pressure).   Eyes: Negative.   Respiratory: Negative.    Cardiovascular: Negative.   Gastrointestinal: Negative.   Genitourinary: Negative.   Musculoskeletal: Negative.   Skin: Negative.   Neurological: Negative.   Psychiatric/Behavioral: Negative.    All other systems reviewed and are negative.    Objective:    Physical Exam Vitals and nursing note reviewed.  Constitutional:      General: She is not in acute distress.    Appearance: Normal appearance. She is normal weight. She is not ill-appearing, toxic-appearing or diaphoretic.  HENT:     Right Ear: Tympanic membrane, ear canal and external ear normal. There is impacted cerumen.     Left Ear: Tympanic membrane, ear canal and external ear normal. There is impacted cerumen.  Cardiovascular:     Rate and Rhythm: Normal rate and regular rhythm.     Heart sounds: Normal heart sounds. No murmur heard.   No friction rub. No gallop.  Pulmonary:     Effort: Pulmonary effort is normal. No respiratory distress.     Breath sounds: Normal breath sounds. No stridor. No wheezing, rhonchi or rales.  Chest:  Chest wall: No tenderness.  Skin:    General: Skin is warm and dry.  Neurological:     General: No focal deficit present.     Mental Status:  She is alert and oriented to person, place, and time. Mental status is at baseline.  Psychiatric:        Mood and Affect: Mood normal.        Behavior: Behavior normal.        Thought Content: Thought content normal.        Judgment: Judgment normal.    BP 121/88   Pulse 98   Temp 98.1 F (36.7 C) (Temporal)   Resp 18   Ht 5' 2"  (1.575 m)   Wt 178 lb 6.4 oz (80.9 kg)   SpO2 99%   BMI 32.63 kg/m  Wt Readings from Last 3 Encounters:  10/04/21 178 lb 6.4 oz (80.9 kg)  08/31/21 177 lb 9.6 oz (80.6 kg)  03/01/21 178 lb (80.7 kg)     Health Maintenance Due  Topic Date Due   Zoster Vaccines- Shingrix (1 of 2) Never done   COVID-19 Vaccine (3 - Booster for Pfizer series) 04/04/2020   MAMMOGRAM  01/01/2021    There are no preventive care reminders to display for this patient.  Lab Results  Component Value Date   TSH 1.18 08/31/2021   Lab Results  Component Value Date   WBC 6.1 08/31/2021   HGB 13.2 08/31/2021   HCT 40.2 08/31/2021   MCV 95.8 08/31/2021   PLT 212.0 08/31/2021   Lab Results  Component Value Date   NA 139 08/31/2021   K 4.1 08/31/2021   CHLORIDE 109 08/21/2017   CO2 26 08/31/2021   GLUCOSE 87 08/31/2021   BUN 18 08/31/2021   CREATININE 1.02 08/31/2021   BILITOT 0.3 08/31/2021   ALKPHOS 99 08/31/2021   AST 12 08/31/2021   ALT 11 08/31/2021   PROT 6.4 08/31/2021   ALBUMIN 4.0 08/31/2021   CALCIUM 8.7 08/31/2021   ANIONGAP 11 08/18/2018   EGFR >60 08/21/2017   GFR 57.58 (L) 08/31/2021   Lab Results  Component Value Date   CHOL 169 08/31/2021   Lab Results  Component Value Date   HDL 54.50 08/31/2021   Lab Results  Component Value Date   LDLCALC 96 08/31/2021   Lab Results  Component Value Date   TRIG 96.0 08/31/2021   Lab Results  Component Value Date   CHOLHDL 3 08/31/2021   No results found for: HGBA1C    Assessment & Plan:   Problem List Items Addressed This Visit   None Visit Diagnoses     Bilateral impacted cerumen     -  Primary   Relevant Medications   acetic acid-hydrocortisone (VOSOL-HC) OTIC solution       Meds ordered this encounter  Medications   acetic acid-hydrocortisone (VOSOL-HC) OTIC solution    Sig: Place 3 drops into both ears 3 (three) times daily as needed.    Dispense:  10 mL    Refill:  0    Order Specific Question:   Supervising Provider    Answer:   Carlota Raspberry, JEFFREY R [[id number]]     Follow-up: Return if symptoms worsen or fail to improve.   PLAN Lavage by Jerene Pitch, CMA, successful. TM visualized. Pearly gray. Will send vosol-hc drops to help with any irritation Return prn Patient encouraged to call clinic with any questions, comments, or concerns.   Maximiano Coss, NP

## 2021-10-20 ENCOUNTER — Encounter: Payer: Self-pay | Admitting: Nurse Practitioner

## 2021-11-11 ENCOUNTER — Encounter: Payer: Self-pay | Admitting: Nurse Practitioner

## 2021-11-11 ENCOUNTER — Other Ambulatory Visit: Payer: Self-pay | Admitting: Family Medicine

## 2021-11-13 ENCOUNTER — Telehealth: Payer: Self-pay

## 2021-11-13 MED ORDER — ALPRAZOLAM 1 MG PO TABS: 1.0000 mg | ORAL_TABLET | Freq: Three times a day (TID) | ORAL | 1 refills | Status: DC | PRN

## 2021-11-13 NOTE — Telephone Encounter (Signed)
Pharmacy has been updated. I called patient to let her know and see if she needed any refills sent there as of now. She stated that at this time she doesn't think she needs anything

## 2021-11-13 NOTE — Telephone Encounter (Signed)
Caller name:Melissa Garza   On DPR? :Yes  Call back [phone]  Provider they see: Birdie Riddle   Reason for call:Pt is calling insurance is making her change her pharmacy onf all RX to [facility] on [address]

## 2021-11-13 NOTE — Telephone Encounter (Signed)
Pt called back new insurance and needs her rx going to mail order [place] Fax [phone] phone number [phone] needs 100 day supply on new insurance with this mail order?

## 2021-11-27 DIAGNOSIS — L400 Psoriasis vulgaris: Secondary | ICD-10-CM | POA: Diagnosis not present

## 2021-11-27 DIAGNOSIS — Z79899 Other long term (current) drug therapy: Secondary | ICD-10-CM | POA: Diagnosis not present

## 2021-11-27 DIAGNOSIS — M25512 Pain in left shoulder: Secondary | ICD-10-CM | POA: Diagnosis not present

## 2021-12-27 ENCOUNTER — Other Ambulatory Visit: Payer: Self-pay

## 2021-12-27 NOTE — Telephone Encounter (Signed)
Encourage patient to contact the pharmacy for refills or they can request refills through [facility]  (Please schedule appointment if patient has not been seen in over a year)    WHAT PHARMACY WOULD THEY LIKE THIS SENT TO: [facility] [address], [place] [address]   MEDICATION NAME & DOSE:traZODone ([place]) 50 MG tablet   NOTES/COMMENTS FROM PATIENT:Note change of Pharmacy Fax number to [place] [phone] and phone number [phone]      [place] office please notify patient: It takes 48-72 hours to process rx refill requests Ask patient to call pharmacy to ensure rx is ready before heading there.

## 2021-12-28 MED ORDER — TRAZODONE HCL 50 MG PO TABS: 50.0000 mg | ORAL_TABLET | Freq: Every evening | ORAL | 1 refills | Status: DC | PRN

## 2021-12-28 NOTE — Telephone Encounter (Signed)
Patient is requesting a refill of the following medications: Requested Prescriptions   Pending Prescriptions Disp Refills   traZODone (DESYREL) 50 MG tablet 90 tablet 1    Sig: Take 1 tablet (50 mg total) by mouth at bedtime as needed for sleep.    Date of patient request: 12/27/21 Last office visit: 10/04/21 Date of last refill: 08/31/21 Last refill amount: 90x1R

## 2022-01-01 ENCOUNTER — Telehealth (INDEPENDENT_AMBULATORY_CARE_PROVIDER_SITE_OTHER): Payer: Medicare HMO | Admitting: Family Medicine

## 2022-01-01 ENCOUNTER — Encounter: Payer: Self-pay | Admitting: Family Medicine

## 2022-01-01 ENCOUNTER — Encounter: Payer: Self-pay | Admitting: Nurse Practitioner

## 2022-01-01 DIAGNOSIS — U071 COVID-19: Secondary | ICD-10-CM | POA: Diagnosis not present

## 2022-01-01 MED ORDER — MOLNUPIRAVIR EUA 200MG CAPSULE: 4.0000 | ORAL_CAPSULE | Freq: Two times a day (BID) | ORAL | 0 refills | Status: AC

## 2022-01-01 NOTE — Progress Notes (Signed)
Virtual Visit via Video   I connected with patient on 01/01/22 at  9:45 AM EST by a video enabled telemedicine application and verified that I am speaking with the correct person using two identifiers.  Location patient: Home Location provider: [facility] Persons participating in the virtual visit: Patient, Provider, Gaston Claiborne Billings C)  I discussed the limitations of evaluation and management by telemedicine and the availability of in person appointments. The patient expressed understanding and agreed to proceed.  Subjective:   HPI:   COVID- pt reports she woke at 3am yesterday w/ severe headache and nasal congestion.  Tested + yesterday.  Husband dx'd w/ COVID over the weekend.  Denies fevers or chills.  Some body aches.  Minimal cough.  ROS:   See pertinent positives and negatives per HPI.  Patient Active Problem List   Diagnosis Date Noted   Plaque psoriasis 03/01/2021   ADHD (attention deficit hyperactivity disorder) 03/01/2021   Hyperlipidemia 12/09/2019   HTN (hypertension) 01/30/2019   Memory loss 01/07/2019   Chronic migraine without aura 01/07/2019   Back pain 10/09/2018   SOB (shortness of breath) on exertion 10/09/2018   Vitamin D deficiency 11/21/2017   Physical exam 11/20/2017   Anemia in neoplastic disease 01/11/2015   Hot flashes 11/02/2014   Constipation 10/05/2014   History of cancer of right breast 08/06/2014   B12 deficiency 06/15/2009   PANIC DISORDER 06/15/2009   Anxiety and depression 06/15/2009   HIATAL HERNIA 06/15/2009   IRRITABLE BOWEL SYNDROME 06/15/2009   Headache 06/15/2009   ALLERGY 06/15/2009    Social History   Tobacco Use   Smoking status: Never   Smokeless tobacco: Never  Substance Use Topics   Alcohol use: No    Current Outpatient Medications:    acetic acid-hydrocortisone (VOSOL-HC) OTIC solution, Place 3 drops into both ears 3 (three) times daily as needed., Disp: 10 mL, Rfl: 0   ALPRAZolam (XANAX) 1 MG  tablet, Take 1 tablet (1 mg total) by mouth 3 (three) times daily as needed for anxiety., Disp: 100 tablet, Rfl: 1   aspirin 81 MG tablet, Take 81 mg by mouth daily., Disp: , Rfl:    brimonidine-timolol (COMBIGAN) 0.2-0.5 % ophthalmic solution, Place 1 drop into the right eye 2 (two) times daily., Disp: , Rfl:    citalopram (CELEXA) 40 MG tablet, Take 40 mg by mouth daily., Disp: , Rfl:    metoprolol succinate (TOPROL-XL) 25 MG 24 hr tablet, TAKE 1 TABLET DAILY, Disp: 90 tablet, Rfl: 3   rosuvastatin (CRESTOR) 10 MG tablet, TAKE 1 TABLET DAILY, Disp: 90 tablet, Rfl: 3   topiramate (TOPAMAX) 100 MG tablet, Take 1 tablet (100 mg total) by mouth at bedtime., Disp: 90 tablet, Rfl: 3   travoprost, benzalkonium, (TRAVATAN) 0.004 % ophthalmic solution, Place 1 drop into both eyes at bedtime. , Disp: , Rfl:    traZODone (DESYREL) 50 MG tablet, Take 1 tablet (50 mg total) by mouth at bedtime., Disp: 30 tablet, Rfl: 0   traZODone (DESYREL) 50 MG tablet, Take 1 tablet (50 mg total) by mouth at bedtime as needed for sleep., Disp: 90 tablet, Rfl: 1  Allergies  Allergen Reactions   Fosamax [Alendronate Sodium]     Severe abd pain, worsened GERD   Hydrocodone Hives and Itching    Objective:   There were no vitals taken for this visit.  AAOx3, NAD NCAT, EOMI No obvious CN deficits Coloring WNL Pt is able to speak clearly, coherently without shortness of breath  or increased work of breathing.  Thought process is linear.  Mood is appropriate.   Assessment and Plan:   COVID- new.  Pt's sxs are consistent w/ and rapid test confirms she has COVID.  Husband also w/ COVID but he is hospitalized w/ a blood clot.  Will start anti-viral treatment.  Encouraged fluids, rest, and supportive care.  Also discussed red flags that should prompt immediate return- SOB, dizziness, dehydration, CP.  Pt expressed understanding and is in agreement w/ plan.    Annye Asa, MD 01/01/2022

## 2022-01-26 ENCOUNTER — Other Ambulatory Visit: Payer: Self-pay | Admitting: Family Medicine

## 2022-02-14 ENCOUNTER — Encounter: Payer: Self-pay | Admitting: Gastroenterology

## 2022-02-26 ENCOUNTER — Encounter: Payer: Self-pay | Admitting: Gastroenterology

## 2022-02-26 ENCOUNTER — Telehealth: Payer: Self-pay | Admitting: Gastroenterology

## 2022-02-26 NOTE — Telephone Encounter (Signed)
The recall is correct for 2023.  ? ?The letter to the patient after the pathologist released his/her findings show the polyps removed were precancerous. This means that they had the potential to change into cancer over time. ?  ?Repeat colonoscopy in 5 years to determine if there are any new polyps and to screen for colorectal cancer. ?

## 2022-02-26 NOTE — Telephone Encounter (Signed)
Patient called, states she received a colonoscopy reminder letter in the mail. On the recall tab it says recall due 2023. Under the procedures it states every 10 years making her recall due in 2028. Patient would like to know when is it actually needed? Please advise.  ?

## 2022-02-27 ENCOUNTER — Encounter: Payer: Self-pay | Admitting: Family Medicine

## 2022-02-27 ENCOUNTER — Ambulatory Visit (INDEPENDENT_AMBULATORY_CARE_PROVIDER_SITE_OTHER): Payer: Medicare HMO | Admitting: Family Medicine

## 2022-02-27 VITALS — BP 120/74 | HR 60 | Temp 97.2°F | Resp 16 | Ht 62.0 in | Wt 175.8 lb

## 2022-02-27 DIAGNOSIS — F32A Depression, unspecified: Secondary | ICD-10-CM | POA: Diagnosis not present

## 2022-02-27 DIAGNOSIS — F419 Anxiety disorder, unspecified: Secondary | ICD-10-CM | POA: Diagnosis not present

## 2022-02-27 DIAGNOSIS — I10 Essential (primary) hypertension: Secondary | ICD-10-CM

## 2022-02-27 DIAGNOSIS — E559 Vitamin D deficiency, unspecified: Secondary | ICD-10-CM | POA: Diagnosis not present

## 2022-02-27 DIAGNOSIS — Z0000 Encounter for general adult medical examination without abnormal findings: Secondary | ICD-10-CM | POA: Diagnosis not present

## 2022-02-27 DIAGNOSIS — R69 Illness, unspecified: Secondary | ICD-10-CM | POA: Diagnosis not present

## 2022-02-27 LAB — CBC WITH DIFFERENTIAL/PLATELET
Basophils Absolute: 0.1 10*3/uL (ref 0.0–0.1)
Basophils Relative: 1 % (ref 0.0–3.0)
Eosinophils Absolute: 0.2 10*3/uL (ref 0.0–0.7)
Eosinophils Relative: 3.7 % (ref 0.0–5.0)
HCT: 41 % (ref 36.0–46.0)
Hemoglobin: 13.4 g/dL (ref 12.0–15.0)
Lymphocytes Relative: 32.5 % (ref 12.0–46.0)
Lymphs Abs: 2.1 10*3/uL (ref 0.7–4.0)
MCHC: 32.6 g/dL (ref 30.0–36.0)
MCV: 96.6 fl (ref 78.0–100.0)
Monocytes Absolute: 0.3 10*3/uL (ref 0.1–1.0)
Monocytes Relative: 4.5 % (ref 3.0–12.0)
Neutro Abs: 3.8 10*3/uL (ref 1.4–7.7)
Neutrophils Relative %: 58.3 % (ref 43.0–77.0)
Platelets: 194 10*3/uL (ref 150.0–400.0)
RBC: 4.24 Mil/uL (ref 3.87–5.11)
RDW: 14 % (ref 11.5–15.5)
WBC: 6.5 10*3/uL (ref 4.0–10.5)

## 2022-02-27 LAB — HEPATIC FUNCTION PANEL
ALT: 12 U/L (ref 0–35)
AST: 12 U/L (ref 0–37)
Albumin: 4.2 g/dL (ref 3.5–5.2)
Alkaline Phosphatase: 91 U/L (ref 39–117)
Bilirubin, Direct: 0.1 mg/dL (ref 0.0–0.3)
Total Bilirubin: 0.3 mg/dL (ref 0.2–1.2)
Total Protein: 6.6 g/dL (ref 6.0–8.3)

## 2022-02-27 LAB — BASIC METABOLIC PANEL
BUN: 12 mg/dL (ref 6–23)
CO2: 26 mEq/L (ref 19–32)
Calcium: 9.1 mg/dL (ref 8.4–10.5)
Chloride: 107 mEq/L (ref 96–112)
Creatinine, Ser: 1.16 mg/dL (ref 0.40–1.20)
GFR: 49.18 mL/min — ABNORMAL LOW (ref >60.00)
Glucose, Bld: 93 mg/dL (ref 70–99)
Potassium: 3.9 mEq/L (ref 3.5–5.1)
Sodium: 141 mEq/L (ref 135–145)

## 2022-02-27 LAB — LIPID PANEL
Cholesterol: 157 mg/dL (ref 0–200)
HDL: 61.8 mg/dL (ref >39.00)
LDL Cholesterol: 79 mg/dL (ref 0–99)
NonHDL: 95.47
Total CHOL/HDL Ratio: 3
Triglycerides: 81 mg/dL (ref 0.0–149.0)
VLDL: 16.2 mg/dL (ref 0.0–40.0)

## 2022-02-27 LAB — TSH: TSH: 1.34 u[IU]/mL (ref 0.35–5.50)

## 2022-02-27 LAB — VITAMIN D 25 HYDROXY (VIT D DEFICIENCY, FRACTURES): VITD: 84.21 ng/mL (ref 30.00–100.00)

## 2022-02-27 MED ORDER — BUPROPION HCL ER (XL) 150 MG PO TB24: 150.0000 mg | ORAL_TABLET | Freq: Every day | ORAL | 3 refills | Status: DC

## 2022-02-27 NOTE — Assessment & Plan Note (Signed)
Pt's PE WNL w/ exception of obesity.  UTD on mammo and DEXA- attempting to get records.  UTD on colonoscopy, flu, Prevnar.  Check labs.  Anticipatory guidance provided.  ?

## 2022-02-27 NOTE — Patient Instructions (Signed)
Follow up in 4-6 weeks to recheck mood ?We'll notify you of your lab results and make any changes if needed ?START the Wellbutrin once daily in addition to the Citalopram ?Consider talking to someone about everything you're going through.  I can suggest counselors if you're interested ?Continue to work on healthy diet and regular exercise- you're doing great! ?Call with any questions or concerns ?Hang in there!!! ?

## 2022-02-27 NOTE — Progress Notes (Signed)
? ?Subjective:  ? ? Patient ID: Melissa Garza, female    DOB: 1956-02-09, 66 y.o.   MRN: [id number] ? ?HPI ?CPE- UTD on mammo (per report), DEXA (per report).  UTD on colonoscopy, flu. ? ?Patient Care Team  ?  Relationship Specialty Notifications Start End  ?Midge Minium, MD PCP - General Family Medicine  08/04/18   ?Excell Seltzer, MD (Inactive) Consulting Physician General Surgery  08/06/14   ?Magrinat, Virgie Dad, MD (Inactive) Consulting Physician Oncology  08/06/14   ?Arloa Koh, MD (Inactive) Consulting Physician Radiation Oncology  08/06/14   ?Sanjuana Kava, MD Referring Physician Obstetrics and Gynecology  08/04/18   ?Mauri Pole, MD Consulting Physician Gastroenterology  08/04/18   ?Boyd Kerbs, MD Referring Physician Specialist  08/04/18   ?Chucky May, MD Consulting Physician Psychiatry  01/07/19   ?  ?Health Maintenance  ?Topic Date Due  ? Zoster Vaccines- Shingrix (1 of 2) Never done  ? COVID-19 Vaccine (3 - Pfizer risk series) 03/07/2020  ? DEXA SCAN  Never done  ? MAMMOGRAM  01/01/2021  ? TETANUS/TDAP  12/25/2021  ? Pneumonia Vaccine 12+ Years old (2 - PPSV23 if available, else PCV20) 03/01/2022  ? INFLUENZA VACCINE  06/05/2022  ? COLONOSCOPY (Pts 45-13yr Insurance coverage will need to be confirmed)  01/12/2027  ? Hepatitis C Screening  Completed  ? HPV VACCINES  Aged Out  ?  ? ? ?Review of Systems ?Patient reports no vision/ hearing changes, adenopathy,fever, weight change,  persistant/recurrent hoarseness , swallowing issues, chest pain, palpitations, edema, persistant/recurrent cough, hemoptysis, dyspnea (rest/exertional/paroxysmal nocturnal), gastrointestinal bleeding (melena, rectal bleeding), abdominal pain, significant heartburn, bowel changes, GU symptoms (dysuria, hematuria, incontinence), Gyn symptoms (abnormal  bleeding, pain),  syncope, focal weakness, memory loss, numbness & tingling, skin/hair/nail changes, abnormal bruising or bleeding.  ? ?+ depression- PHQ=17.   Currently on Celexa '40mg'$  daily and Alprazolam prn.  Pt admits she needs additional depression control.  We had discussed this previously but she was not interested at that time.  Husband's COPD is deteriorating and she is angry w/ him for still smoking.  Feels that he's 'not trying so why should I?' ?   ?Objective:  ? Physical Exam ?General Appearance:    Alert, cooperative, no distress, appears stated age  ?Head:    Normocephalic, without obvious abnormality, atraumatic  ?Eyes:    PERRL, conjunctiva/corneas clear, EOM's intact both eyes  ?Ears:    Normal TM's and external ear canals, both ears  ?Nose:   Nares normal, septum midline, mucosa normal, no drainage  ?  or sinus tenderness  ?Throat:   Lips, mucosa, and tongue normal; teeth and gums normal  ?Neck:   Supple, symmetrical, trachea midline, no adenopathy;  ?  Thyroid: no enlargement/tenderness/nodules  ?Back:     Symmetric, no curvature, ROM normal, no CVA tenderness  ?Lungs:     Clear to auscultation bilaterally, respirations unlabored  ?Chest Wall:    No tenderness or deformity  ? Heart:    Regular rate and rhythm, S1 and S2 normal, no murmur, rub ?  or gallop  ?Breast Exam:    Deferred to mammo  ?Abdomen:     Soft, non-tender, bowel sounds active all four quadrants,  ?  no masses, no organomegaly  ?Genitalia:    Deferred to GYN  ?Rectal:    ?Extremities:   Extremities normal, atraumatic, no cyanosis or edema  ?Pulses:   2+ and symmetric all extremities  ?Skin:   Skin color, texture, turgor normal, no rashes  or lesions  ?Lymph nodes:   Cervical, supraclavicular, and axillary nodes normal  ?Neurologic:   CNII-XII intact, normal strength, sensation and reflexes  ?  throughout  ?  ? ? ? ?   ?Assessment & Plan:  ? ? ?

## 2022-02-27 NOTE — Assessment & Plan Note (Signed)
Deteriorated.  Pt's PHQ=17 despite Citalopram and Alprazolam.  Pt reports she has no motivation to get up, get dressed, shower, or eat.  Says she can go weeks w/o leaving the house.  Husband's health is deteriorating and she reports feeling hopeless.  Will add Wellbutrin to her daily Citalopram and Alprazolam.  Encouraged her to consider counseling.  Will follow closely to ensure sxs are improving. ?

## 2022-02-27 NOTE — Assessment & Plan Note (Signed)
Check labs and replete prn. 

## 2022-02-27 NOTE — Assessment & Plan Note (Signed)
Chronic problem.  Adequate control on Metoprolol.  No med changes at this time ?

## 2022-02-28 DIAGNOSIS — M810 Age-related osteoporosis without current pathological fracture: Secondary | ICD-10-CM | POA: Diagnosis not present

## 2022-03-01 ENCOUNTER — Telehealth: Payer: Self-pay

## 2022-03-01 NOTE — Telephone Encounter (Signed)
MEDICATION:topiramate (TOPAMAX) 100 MG tablet ? ?[place] to Registered [employer] Sites ? ?Comments: Patient has new insurance, needs [facility].  ? ?**Let patient know to contact pharmacy at the end of the day to make sure medication is ready. ** ? ?** Please notify patient to allow 48-72 hours to process** ? ?**Encourage patient to contact the pharmacy for refills or they can request refills through [facility]** ? ? ?

## 2022-03-01 NOTE — Telephone Encounter (Signed)
Patient called in stating she is needing her recent blood work sent to [facility] - fax: [phone] ?

## 2022-03-02 ENCOUNTER — Other Ambulatory Visit: Payer: Self-pay

## 2022-03-02 MED ORDER — TOPIRAMATE 100 MG PO TABS: 100.0000 mg | ORAL_TABLET | Freq: Every day | ORAL | 1 refills | Status: DC

## 2022-03-02 NOTE — Telephone Encounter (Signed)
Sent and pt informed of results  ?

## 2022-03-12 DIAGNOSIS — H401233 Low-tension glaucoma, bilateral, severe stage: Secondary | ICD-10-CM | POA: Diagnosis not present

## 2022-03-12 DIAGNOSIS — H04123 Dry eye syndrome of bilateral lacrimal glands: Secondary | ICD-10-CM | POA: Diagnosis not present

## 2022-03-12 DIAGNOSIS — H43811 Vitreous degeneration, right eye: Secondary | ICD-10-CM | POA: Diagnosis not present

## 2022-03-21 ENCOUNTER — Ambulatory Visit (AMBULATORY_SURGERY_CENTER): Payer: Medicare HMO

## 2022-03-21 VITALS — Ht 62.0 in | Wt 174.0 lb

## 2022-03-21 DIAGNOSIS — Z86010 Personal history of colonic polyps: Secondary | ICD-10-CM

## 2022-03-21 MED ORDER — NA SULFATE-K SULFATE-MG SULF 17.5-3.13-1.6 GM/177ML PO SOLN: 1.0000 | Freq: Once | ORAL | 0 refills | Status: AC

## 2022-03-21 NOTE — Progress Notes (Signed)
No egg or soy allergy known to patient  ?No issues known to pt with past sedation with any surgeries or procedures---other than PONV ?Patient denies ever being told they had issues or difficulty with intubation  ?No FH of Malignant Hyperthermia ?Pt is not on diet pills ?Pt is not on home 02  ?Pt is not on blood thinners  ?Pt denies issues with constipation at this time; ?No A fib or A flutter ?NO PA's for preps discussed with pt in PV today  ?Discussed with pt there will be an out-of-pocket cost for prep and that varies from $0 to 70 + dollars - pt verbalized understanding  ?Pt instructed to use [employer] or GoodRx for a price reduction on prep  ?PV completed over the phone. Pt verified name, DOB, address and insurance during PV today.  ?Pt mailed instruction packet with copy of consent form to read and not return, and instructions.  ?Pt encouraged to call with questions or issues.  ?Pt has My chart, procedure instructions sent via My Chart  ?Insurance confirmed with pt at [facility] today  ? ?

## 2022-03-27 ENCOUNTER — Ambulatory Visit: Payer: Medicare HMO | Admitting: Family Medicine

## 2022-04-03 ENCOUNTER — Encounter: Payer: Self-pay | Admitting: Family Medicine

## 2022-04-03 ENCOUNTER — Ambulatory Visit (INDEPENDENT_AMBULATORY_CARE_PROVIDER_SITE_OTHER): Payer: Medicare HMO | Admitting: Family Medicine

## 2022-04-03 VITALS — BP 136/88 | HR 82 | Temp 98.2°F | Resp 16 | Ht 62.0 in | Wt 175.2 lb

## 2022-04-03 DIAGNOSIS — F419 Anxiety disorder, unspecified: Secondary | ICD-10-CM

## 2022-04-03 DIAGNOSIS — F32A Depression, unspecified: Secondary | ICD-10-CM | POA: Diagnosis not present

## 2022-04-03 DIAGNOSIS — R69 Illness, unspecified: Secondary | ICD-10-CM | POA: Diagnosis not present

## 2022-04-03 NOTE — Progress Notes (Signed)
   Subjective:    Patient ID: Melissa Garza, female    DOB: [birth year], 66 y.o.   MRN: [id number]  HPI Depression- at last visit we added Wellbutrin '150mg'$  daily to her current dose of Citalopram.  Has been on medication x4 weeks but doesn't notice a difference.  No change in energy or motivation.  'i sleep, eat, watch TV and do the same the next day'.  'i'll go a week and not wash my hair'.   Review of Systems For ROS see HPI     Objective:   Physical Exam Vitals reviewed.  Constitutional:      General: She is not in acute distress.    Appearance: Normal appearance. She is not ill-appearing.  HENT:     Head: Normocephalic and atraumatic.  Skin:    General: Skin is warm and dry.  Neurological:     General: No focal deficit present.     Mental Status: She is alert and oriented to person, place, and time.  Psychiatric:        Mood and Affect: Mood normal.        Behavior: Behavior normal.        Thought Content: Thought content normal.          Assessment & Plan:

## 2022-04-03 NOTE — Patient Instructions (Signed)
Follow up in 4-6 weeks to recheck mood INCREASE the Citalopram to '60mg'$  daily (1.5 tabs daily) CONTINUE the Bupropion daily Call with any questions or concerns Hang in there!  You deserve to feel better!!!

## 2022-04-03 NOTE — Assessment & Plan Note (Signed)
Unchanged despite addition of Wellbutrin.  Will increase Citalopram to '60mg'$  daily, continue Wellbutrin at '150mg'$  daily.  If no improvement, discussed the addition of something like low dose Abilify to augment depression tx.  Pt expressed understanding and is in agreement w/ plan.

## 2022-04-10 ENCOUNTER — Encounter: Payer: Self-pay | Admitting: Gastroenterology

## 2022-04-12 DIAGNOSIS — M810 Age-related osteoporosis without current pathological fracture: Secondary | ICD-10-CM | POA: Diagnosis not present

## 2022-04-16 ENCOUNTER — Encounter: Payer: Self-pay | Admitting: Gastroenterology

## 2022-04-16 ENCOUNTER — Ambulatory Visit (AMBULATORY_SURGERY_CENTER): Payer: Medicare HMO | Admitting: Gastroenterology

## 2022-04-16 VITALS — BP 96/42 | HR 65 | Temp 98.0°F | Resp 16 | Ht 62.0 in | Wt 174.0 lb

## 2022-04-16 DIAGNOSIS — Z09 Encounter for follow-up examination after completed treatment for conditions other than malignant neoplasm: Secondary | ICD-10-CM

## 2022-04-16 DIAGNOSIS — D122 Benign neoplasm of ascending colon: Secondary | ICD-10-CM | POA: Diagnosis not present

## 2022-04-16 DIAGNOSIS — Z86010 Personal history of colonic polyps: Secondary | ICD-10-CM

## 2022-04-16 MED ORDER — SODIUM CHLORIDE 0.9 % IV SOLN: 500.0000 mL | Freq: Once | INTRAVENOUS | Status: DC

## 2022-04-16 NOTE — Progress Notes (Signed)
[facility] History and Physical   Primary Care Physician:  Midge Minium, MD   Reason for Procedure:  History of adenomatous colon polyps  Plan:    Surveillance colonoscopy with possible interventions as needed     HPI: Melissa Garza is a very pleasant 66 y.o. female here for surveillance colonoscopy. Denies any nausea, vomiting, abdominal pain, melena or bright red blood per rectum  The risks and benefits as well as alternatives of endoscopic procedure(s) have been discussed and reviewed. All questions answered. The patient agrees to proceed.    Past Medical History:  Diagnosis Date   Anxiety    on meds   Breast cancer ([place])    RIGHT   Cancer ([place])    cancer of right breast   Cataracts, bilateral 2018   on meds   Depression    on meds   Environmental allergies 2007   GERD (gastroesophageal reflux disease)    hx of-uses OTC PRN meds   Glaucoma    on meds   Headache    chronic   Hepatitis    Hx: of Hepatitis not sure which one   Hot flashes    Hyperlipidemia    on meds   Hypertension    on meds   Osteoporosis    on meds-every 6 months   Personal history of chemotherapy    Personal history of radiation therapy    PONV (postoperative nausea and vomiting)    S/P radiation therapy 03/01/2015 through 04/08/2015                                    03/01/2015 through 04/08/2015                                                                     Right breast 5040 cGy in 28 sessions (no boost)     Seasonal allergies    Shortness of breath    with exertion    Past Surgical History:  Procedure Laterality Date   BREAST ENHANCEMENT SURGERY  2008   BREAST LUMPECTOMY Right 2016   BREAST SURGERY     Biopsy   COLONOSCOPY  2018   KN-MAC-suprep/mira(exc)-polyp   DILATION AND CURETTAGE OF UTERUS     EYE SURGERY     Lasik   PORT-A-CATH REMOVAL N/A 02/01/2015   Procedure: REMOVAL PORT-A-CATH;  Surgeon: Excell Seltzer, MD;  Location: [facility];  Service: General;  Laterality: N/A;   PORTACATH PLACEMENT N/A 08/13/2014   Procedure: INSERTION PORT-A-CATH;  Surgeon: Excell Seltzer, MD;  Location: [facility];  Service: General;  Laterality: N/A;   TONSILECTOMY/ADENOIDECTOMY WITH MYRINGOTOMY     TUBAL LIGATION     WISDOM TOOTH EXTRACTION      Prior to Admission medications   Medication Sig Start Date End Date Taking? Authorizing Provider  ALPRAZolam Duanne Moron) 1 MG tablet Take 1 tablet (1 mg total) by mouth 3 (three) times daily as needed for anxiety. 11/13/21  Yes Midge Minium, MD  aspirin 81 MG tablet Take 81 mg by mouth daily.   Yes [provider]  brimonidine-timolol (COMBIGAN) 0.2-0.5 % ophthalmic solution Place 1 drop into the right  eye 2 (two) times daily.   Yes [provider]  buPROPion (WELLBUTRIN XL) 150 MG 24 hr tablet Take 1 tablet (150 mg total) by mouth daily. 02/27/22  Yes Midge Minium, MD  Calcium Carb-Cholecalciferol (CALCIUM 500 + D PO) Take 2 tablets by mouth in the morning and at bedtime. GUMMY   Yes [provider]  Cholecalciferol (VITAMIN D3 MAXIMUM STRENGTH) 125 MCG (5000 UT) capsule Take 1 capsule by mouth daily at 6 (six) AM.   Yes [provider]  citalopram (CELEXA) 40 MG tablet Take 60 mg by mouth daily. 01/31/21  Yes [provider]  Cyanocobalamin (VITAMIN B12 PO) Take 1 tablet by mouth daily at 6 (six) AM.   Yes [provider]  fluticasone (FLONASE) 50 MCG/ACT nasal spray Place 1 spray into both nostrils daily as needed.   Yes [provider]  metoprolol succinate (TOPROL-XL) 25 MG 24 hr tablet TAKE 1 TABLET DAILY 06/19/21  Yes Midge Minium, MD  Risankizumab-rzaa,150 MG Dose, (SKYRIZI, 150 MG DOSE,) 75 MG/0.83ML PSKT Inject into the skin.   Yes [provider]  rosuvastatin (CRESTOR) 10 MG tablet TAKE 1 TABLET DAILY 06/19/21  Yes Midge Minium, MD  topiramate (TOPAMAX) 100 MG tablet Take 1 tablet (100 mg  total) by mouth at bedtime. 03/02/22  Yes Midge Minium, MD  travoprost, benzalkonium, (TRAVATAN) 0.004 % ophthalmic solution Place 1 drop into both eyes at bedtime.    Yes Gwendalyn Ege, OD  traZODone (DESYREL) 50 MG tablet Take 1 tablet (50 mg total) by mouth at bedtime. 08/31/21  Yes Midge Minium, MD  denosumab (PROLIA) 60 MG/ML SOSY injection every 6 (six) months. 09/05/21   [provider]    Current Outpatient Medications  Medication Sig Dispense Refill   ALPRAZolam (XANAX) 1 MG tablet Take 1 tablet (1 mg total) by mouth 3 (three) times daily as needed for anxiety. 100 tablet 1   aspirin 81 MG tablet Take 81 mg by mouth daily.     brimonidine-timolol (COMBIGAN) 0.2-0.5 % ophthalmic solution Place 1 drop into the right eye 2 (two) times daily.     buPROPion (WELLBUTRIN XL) 150 MG 24 hr tablet Take 1 tablet (150 mg total) by mouth daily. 30 tablet 3   Calcium Carb-Cholecalciferol (CALCIUM 500 + D PO) Take 2 tablets by mouth in the morning and at bedtime. GUMMY     Cholecalciferol (VITAMIN D3 MAXIMUM STRENGTH) 125 MCG (5000 UT) capsule Take 1 capsule by mouth daily at 6 (six) AM.     citalopram (CELEXA) 40 MG tablet Take 60 mg by mouth daily.     Cyanocobalamin (VITAMIN B12 PO) Take 1 tablet by mouth daily at 6 (six) AM.     fluticasone (FLONASE) 50 MCG/ACT nasal spray Place 1 spray into both nostrils daily as needed.     metoprolol succinate (TOPROL-XL) 25 MG 24 hr tablet TAKE 1 TABLET DAILY 90 tablet 3   Risankizumab-rzaa,150 MG Dose, (SKYRIZI, 150 MG DOSE,) 75 MG/0.83ML PSKT Inject into the skin.     rosuvastatin (CRESTOR) 10 MG tablet TAKE 1 TABLET DAILY 90 tablet 3   topiramate (TOPAMAX) 100 MG tablet Take 1 tablet (100 mg total) by mouth at bedtime. 90 tablet 1   travoprost, benzalkonium, (TRAVATAN) 0.004 % ophthalmic solution Place 1 drop into both eyes at bedtime.      traZODone (DESYREL) 50 MG tablet Take 1 tablet (50 mg total) by mouth at bedtime. 30 tablet 0  denosumab (PROLIA) 60 MG/ML SOSY injection every 6 (six) months.     Current Facility-Administered Medications  Medication Dose Route Frequency Provider Last Rate Last Admin   0.9 %  sodium chloride infusion  500 mL Intravenous Once Mauri Pole, MD        Allergies as of 04/16/2022 - Review Complete 04/16/2022  Allergen Reaction Noted   Alendronate Other (See Comments) 09/05/2021   Fosamax [alendronate sodium]  08/31/2021   Hydrocodone Hives, Itching, and Nausea And Vomiting 08/30/2014    Family History  Problem Relation Age of Onset   Psoriasis Mother    Pulmonary embolism Mother    Hypertension Father    Glaucoma Father    Heart attack Father    Colon polyps Sister    Healthy Sister    Sleep apnea Sister    Healthy Sister    Thyroid disease Maternal Grandmother    Asthma Paternal Grandmother    Esophageal cancer Neg Hx    Rectal cancer Neg Hx    Stomach cancer Neg Hx    Colon cancer Neg Hx     Social History   Socioeconomic History   Marital status: Married    Spouse name: [name]   Number of children: 2   Years of education: Not on file   Highest education level: High school graduate  Occupational History   Occupation: Glass blower/designer    Comment: Data processing manager  Tobacco Use   Smoking status: Never   Smokeless tobacco: Never  Vaping Use   Vaping Use: Never used  Substance and Sexual Activity   Alcohol use: No   Drug use: No   Sexual activity: Not Currently  Other Topics Concern   Not on file  Social History Narrative   Lives at home with her husband   1 cup of coffee daily.     Occasional tea.   Social Determinants of Health   Financial Resource Strain: Not on file  Food Insecurity: Not on file  Transportation Needs: Not on file  Physical Activity: Not on file  Stress: Not on file  Social Connections: Not on file  Intimate Partner Violence: Not on file    Review of Systems:  All other review of systems negative except as mentioned in the  HPI.  Physical Exam: Vital signs in last 24 hours: BP (!) 98/59   Pulse 86   Temp 98 F (36.7 C)   Ht _0  (1.575 m)   Wt 174 lb (78.9 kg)   SpO2 97%   BMI 31.83 kg/m  General:   Alert, NAD Lungs:  Clear .   Heart:  Regular rate and rhythm Abdomen:  Soft, nontender and nondistended. Neuro/Psych:  Alert and cooperative. Normal mood and affect. A and O x 3  Reviewed labs, radiology imaging, old records and pertinent past GI work up  Patient is appropriate for planned procedure(s) and anesthesia in an ambulatory setting   K. Denzil Magnuson , MD [phone]

## 2022-04-16 NOTE — Progress Notes (Signed)
Pt's states no medical or surgical changes since previsit or office visit. 

## 2022-04-16 NOTE — Progress Notes (Signed)
Called to room to assist during endoscopic procedure.  Patient ID and intended procedure confirmed with present staff. Received instructions for my participation in the procedure from the performing physician.  

## 2022-04-16 NOTE — Op Note (Signed)
[facility] Patient Name: Melissa Garza Procedure Date: 04/16/2022 3:47 PM MRN: [id number] Endoscopist: Mauri Pole , MD Age: 66 Referring MD:  Date of Birth: 05-22-1956 Gender: Female Account #: [id number] Procedure:                Colonoscopy Indications:              High risk colon cancer surveillance: Personal                            history of colonic polyps, High risk colon cancer                            surveillance: Personal history of adenoma less than                            10 mm in size Medicines:                Monitored Anesthesia Care Procedure:                Pre-Anesthesia Assessment:                           - Prior to the procedure, a History and Physical                            was performed, and patient medications and                            allergies were reviewed. The patient's tolerance of                            previous anesthesia was also reviewed. The risks                            and benefits of the procedure and the sedation                            options and risks were discussed with the patient.                            All questions were answered, and informed consent                            was obtained. Prior Anticoagulants: The patient has                            taken no previous anticoagulant or antiplatelet                            agents. ASA Grade Assessment: II - A patient with                            mild systemic disease. After reviewing the risks  and benefits, the patient was deemed in                            satisfactory condition to undergo the procedure.                           After obtaining informed consent, the colonoscope                            was passed under direct vision. Throughout the                            procedure, the patient's blood pressure, pulse, and                            oxygen saturations were monitored  continuously. The                            Olympus PCF-H190DL ([id number]) Colonoscope was                            introduced through the anus and advanced to the the                            cecum, identified by appendiceal orifice and                            ileocecal valve. The colonoscopy was performed                            without difficulty. The patient tolerated the                            procedure well. The quality of the bowel                            preparation was good. The ileocecal valve,                            appendiceal orifice, and rectum were photographed. Scope In: 3:52:12 PM Scope Out: 4:04:09 PM Scope Withdrawal Time: 0 hours 8 minutes 45 seconds  Total Procedure Duration: 0 hours 11 minutes 57 seconds  Findings:                 The perianal and digital rectal examinations were                            normal.                           Two sessile polyps were found in the ascending                            colon. The polyps were 3 to 5 mm in size. These  polyps were removed with a cold snare. Resection                            and retrieval were complete.                           A few small-mouthed diverticula were found in the                            sigmoid colon.                           Non-bleeding external and internal hemorrhoids were                            found during retroflexion. The hemorrhoids were                            small. Complications:            No immediate complications. Estimated Blood Loss:     Estimated blood loss was minimal. Impression:               - Two 3 to 5 mm polyps in the ascending colon,                            removed with a cold snare. Resected and retrieved.                           - Diverticulosis in the sigmoid colon.                           - Non-bleeding external and internal hemorrhoids. Recommendation:           - Patient has a contact number  available for                            emergencies. The signs and symptoms of potential                            delayed complications were discussed with the                            patient. Return to normal activities tomorrow.                            Written discharge instructions were provided to the                            patient.                           - Resume previous diet.                           - Continue present medications.                           -  Await pathology results.                           - Repeat colonoscopy in 5-10 years for surveillance                            based on pathology results. Mauri Pole, MD 04/16/2022 4:13:03 PM This report has been signed electronically.

## 2022-04-16 NOTE — Patient Instructions (Signed)
    HANDOUTS ON HEMORRHOIDS, DIVERTICULOSIS AND POLYPS GIVEN.  YOU HAD AN ENDOSCOPIC PROCEDURE TODAY AT [facility]:   Refer to the procedure report that was given to you for any specific questions about what was found during the examination.  If the procedure report does not answer your questions, please call your gastroenterologist to clarify.  If you requested that your care partner not be given the details of your procedure findings, then the procedure report has been included in a sealed envelope for you to review at your convenience later.  YOU SHOULD EXPECT: Some feelings of bloating in the abdomen. Passage of more gas than usual.  Walking can help get rid of the air that was put into your GI tract during the procedure and reduce the bloating. If you had a lower endoscopy (such as a colonoscopy or flexible sigmoidoscopy) you may notice spotting of blood in your stool or on the toilet paper. If you underwent a bowel prep for your procedure, you may not have a normal bowel movement for a few days.  Please Note:  You might notice some irritation and congestion in your nose or some drainage.  This is from the oxygen used during your procedure.  There is no need for concern and it should clear up in a day or so.  SYMPTOMS TO REPORT IMMEDIATELY:  Following lower endoscopy (colonoscopy or flexible sigmoidoscopy):  Excessive amounts of blood in the stool  Significant tenderness or worsening of abdominal pains  Swelling of the abdomen that is new, acute  Fever of 100F or higher   For urgent or emergent issues, a gastroenterologist can be reached at any hour by calling [phone]. Do not use MyChart messaging for urgent concerns.    DIET:  We do recommend a small meal at first, but then you may proceed to your regular diet.  Drink plenty of fluids but you should avoid alcoholic beverages for 24 hours.  ACTIVITY:  You should plan to take it easy for the rest of today and  you should NOT DRIVE or use heavy machinery until tomorrow (because of the sedation medicines used during the test).    FOLLOW UP: Our staff will call the number listed on your records 24-72 hours following your procedure to check on you and address any questions or concerns that you may have regarding the information given to you following your procedure. If we do not reach you, we will leave a message.  We will attempt to reach you two times.  During this call, we will ask if you have developed any symptoms of COVID 19. If you develop any symptoms (ie: fever, flu-like symptoms, shortness of breath, cough etc.) before then, please call [phone].  If you test positive for Covid 19 in the 2 weeks post procedure, please call and report this information to [place].    If any biopsies were taken you will be contacted by phone or by letter within the next 1-3 weeks.  Please call us at [phone] if you have not heard about the biopsies in 3 weeks.    SIGNATURES/CONFIDENTIALITY: You and/or your care partner have signed paperwork which will be entered into your electronic medical record.  These signatures attest to the fact that that the information above on your After Visit Summary has been reviewed and is understood.  Full responsibility of the confidentiality of this discharge information lies with you and/or your care-partner.

## 2022-04-16 NOTE — Progress Notes (Signed)
Sedate, gd SR, tolerated procedure well, VSS, report to RN 

## 2022-04-17 ENCOUNTER — Telehealth: Payer: Self-pay | Admitting: *Deleted

## 2022-04-17 ENCOUNTER — Encounter: Payer: Self-pay | Admitting: Gastroenterology

## 2022-04-17 ENCOUNTER — Other Ambulatory Visit: Payer: Self-pay | Admitting: Family Medicine

## 2022-04-17 MED ORDER — ALPRAZOLAM 1 MG PO TABS
1.0000 mg | ORAL_TABLET | Freq: Three times a day (TID) | ORAL | 1 refills | Status: DC | PRN
Start: 2022-04-17 — End: 2022-10-02

## 2022-04-17 NOTE — Progress Notes (Signed)
Prescription refilled at pt's request 

## 2022-04-17 NOTE — Telephone Encounter (Signed)
  Follow up Call-     04/16/2022    3:33 PM  Call back number  Post procedure Call Back phone  # [phone]  Permission to leave phone message Yes     Patient questions:  Do you have a fever, pain , or abdominal swelling? No. Pain Score  0 *  Have you tolerated food without any problems? Yes.    Have you been able to return to your normal activities? Yes.    Do you have any questions about your discharge instructions: Diet   No. Medications  No. Follow up visit  No.  Do you have questions or concerns about your Care? No.  Actions: * If pain score is 4 or above: No action needed, pain <4.

## 2022-04-23 ENCOUNTER — Telehealth: Payer: Self-pay | Admitting: Gastroenterology

## 2022-04-23 NOTE — Telephone Encounter (Signed)
Colonoscopy 04/16/22. She has not passed stool since then. She feels "tired and weak." Diet is regular. Denies excessive gas. Not bloated. No nausea or vomiting. Denies fever. Her concern is that she has not had a bowel movement. Thanks

## 2022-04-24 NOTE — Telephone Encounter (Signed)
Please advise patient Melissa Garza add stool softener Colace 1 capsule twice daily if no improvement within the next 1 to 2 days, do bowel purge with MiraLAX and then use 1 capful of MiraLAX daily.  Continue with diet as tolerated and increase water intake to 8 to 10 cups daily.  Please schedule follow-up office visit next available appointment with me or app.  Thank you

## 2022-04-24 NOTE — Telephone Encounter (Signed)
Spoke with the patient. Reviewed the plan. She will start with Colace as directed. She will call back for the Miralax purge if she is still without a bowel movement. She will call back sooner if she acutely worsens. Appointment with APP 05/17/22 first opening.

## 2022-04-26 ENCOUNTER — Encounter: Payer: Self-pay | Admitting: Gastroenterology

## 2022-05-07 ENCOUNTER — Encounter: Payer: Self-pay | Admitting: Nurse Practitioner

## 2022-05-15 ENCOUNTER — Ambulatory Visit (INDEPENDENT_AMBULATORY_CARE_PROVIDER_SITE_OTHER): Payer: Medicare HMO

## 2022-05-15 DIAGNOSIS — Z0000 Encounter for general adult medical examination without abnormal findings: Secondary | ICD-10-CM

## 2022-05-15 NOTE — Progress Notes (Signed)
Subjective:   Melissa Garza is a 66 y.o. female who presents for an Initial Medicare Annual Wellness Visit.   I connected with [name]  today by telephone and verified that I am speaking with the correct person using two identifiers. Location patient: home Location provider: work Persons participating in the virtual visit: patient, provider.   I discussed the limitations, risks, security and privacy concerns of performing an evaluation and management service by telephone and the availability of in person appointments. I also discussed with the patient that there may be a patient responsible charge related to this service. The patient expressed understanding and verbally consented to this telephonic visit.    Interactive audio and video telecommunications were attempted between this provider and patient, however failed, due to patient having technical difficulties OR patient did not have access to video capability.  We continued and completed visit with audio only.    Review of Systems     Cardiac Risk Factors include: advanced age (>95mn, >>10women)     Objective:    Today's Vitals   There is no height or weight on file to calculate BMI.     05/15/2022    1:44 PM 01/11/2017    8:59 AM 12/28/2016    3:40 PM 11/07/2016    4:54 PM 05/26/2015    3:28 PM 04/05/2015   11:02 AM 03/28/2015   10:59 AM  Advanced Directives  Does Patient Have a Medical Advance Directive? No Yes Yes No  Yes Yes  Type of Advance Directive  Living will Living will  [place]  Copy of [place]?      Yes Yes  Would patient like information on creating a medical advance directive? No - Patient declined    Yes - Educational materials given      Current Medications (verified) Outpatient Encounter Medications as of 05/15/2022  Medication Sig   ALPRAZolam  (XANAX) 1 MG tablet Take 1 tablet (1 mg total) by mouth 3 (three) times daily as needed for anxiety.   aspirin 81 MG tablet Take 81 mg by mouth daily.   brimonidine-timolol (COMBIGAN) 0.2-0.5 % ophthalmic solution Place 1 drop into the right eye 2 (two) times daily.   buPROPion (WELLBUTRIN XL) 150 MG 24 hr tablet Take 1 tablet (150 mg total) by mouth daily.   Calcium Carb-Cholecalciferol (CALCIUM 500 + D PO) Take 2 tablets by mouth in the morning and at bedtime. GUMMY   Cholecalciferol (VITAMIN D3 MAXIMUM STRENGTH) 125 MCG (5000 UT) capsule Take 1 capsule by mouth daily at 6 (six) AM.   citalopram (CELEXA) 40 MG tablet Take 60 mg by mouth daily.   Cyanocobalamin (VITAMIN B12 PO) Take 1 tablet by mouth daily at 6 (six) AM.   denosumab (PROLIA) 60 MG/ML SOSY injection every 6 (six) months.   fluticasone (FLONASE) 50 MCG/ACT nasal spray Place 1 spray into both nostrils daily as needed.   metoprolol succinate (TOPROL-XL) 25 MG 24 hr tablet TAKE 1 TABLET DAILY   Risankizumab-rzaa,150 MG Dose, (SKYRIZI, 150 MG DOSE,) 75 MG/0.83ML PSKT Inject into the skin.   rosuvastatin (CRESTOR) 10 MG tablet TAKE 1 TABLET DAILY   topiramate (TOPAMAX) 100 MG tablet Take 1 tablet (100 mg total) by mouth at bedtime.   travoprost, benzalkonium, (TRAVATAN) 0.004 % ophthalmic solution Place 1 drop into both eyes at bedtime.    traZODone (DESYREL) 50 MG tablet  Take 1 tablet (50 mg total) by mouth at bedtime.   No facility-administered encounter medications on file as of 05/15/2022.    Allergies (verified) Alendronate, Fosamax [alendronate sodium], and Hydrocodone   History: Past Medical History:  Diagnosis Date   Anxiety    on meds   Breast cancer ([place])    RIGHT   Cancer ([place])    cancer of right breast   Cataracts, bilateral 2018   on meds   Depression    on meds   Environmental allergies 2007   GERD (gastroesophageal reflux disease)    hx of-uses OTC PRN meds   Glaucoma    on meds   Headache    chronic    Hepatitis    Hx: of Hepatitis not sure which one   Hot flashes    Hyperlipidemia    on meds   Hypertension    on meds   Osteoporosis    on meds-every 6 months   Personal history of chemotherapy    Personal history of radiation therapy    PONV (postoperative nausea and vomiting)    S/P radiation therapy 03/01/2015 through 04/08/2015                                    03/01/2015 through 04/08/2015                                                                     Right breast 5040 cGy in 28 sessions (no boost)     Seasonal allergies    Shortness of breath    with exertion   Past Surgical History:  Procedure Laterality Date   BREAST ENHANCEMENT SURGERY  2008   BREAST LUMPECTOMY Right 2016   BREAST SURGERY     Biopsy   COLONOSCOPY  2018   KN-MAC-suprep/mira(exc)-polyp   DILATION AND CURETTAGE OF UTERUS     EYE SURGERY     Lasik   PORT-A-CATH REMOVAL N/A 02/01/2015   Procedure: REMOVAL PORT-A-CATH;  Surgeon: Excell Seltzer, MD;  Location: [facility];  Service: General;  Laterality: N/A;   PORTACATH PLACEMENT N/A 08/13/2014   Procedure: INSERTION PORT-A-CATH;  Surgeon: Excell Seltzer, MD;  Location: [facility];  Service: General;  Laterality: N/A;   TONSILECTOMY/ADENOIDECTOMY WITH MYRINGOTOMY     TUBAL LIGATION     WISDOM TOOTH EXTRACTION     Family History  Problem Relation Age of Onset   Psoriasis Mother    Pulmonary embolism Mother    Hypertension Father    Glaucoma Father    Heart attack Father    Colon polyps Sister    Healthy Sister    Sleep apnea Sister    Healthy Sister    Thyroid disease Maternal Grandmother    Asthma Paternal Grandmother    Esophageal cancer Neg Hx    Rectal cancer Neg Hx    Stomach cancer Neg Hx    Colon cancer Neg Hx    Social History   Socioeconomic History   Marital status: Married    Spouse name: [name]   Number of children: 2   Years of education: Not on file   Highest education level: High school graduate  Occupational History   Occupation: Glass blower/designer    Comment: Data processing manager  Tobacco Use   Smoking status: Never   Smokeless tobacco: Never  Vaping Use   Vaping Use: Never used  Substance and Sexual Activity   Alcohol use: No   Drug use: No   Sexual activity: Not Currently  Other Topics Concern   Not on file  Social History Narrative   Lives at home with her husband   1 cup of coffee daily.     Occasional tea.   Social Determinants of Health   Financial Resource Strain: Low Risk  (05/15/2022)   Overall Financial Resource Strain (CARDIA)    Difficulty of Paying Living Expenses: Not hard at all  Food Insecurity: No Food Insecurity (05/15/2022)   Hunger Vital Sign    Worried About Running Out of Food in the Last Year: Never true    Ran Out of Food in the Last Year: Never true  Transportation Needs: No Transportation Needs (05/15/2022)   PRAPARE - Hydrologist (Medical): No    Lack of Transportation (Non-Medical): No  Physical Activity: Inactive (05/15/2022)   Exercise Vital Sign    Days of Exercise per Week: 0 days    Minutes of Exercise per Session: 0 min  Stress: No Stress Concern Present (05/15/2022)   [place]    Feeling of Stress : Not at all  Social Connections: Moderately Isolated (05/15/2022)   Social Connection and Isolation Panel [NHANES]    Frequency of Communication with Friends and Family: Three times a week    Frequency of Social Gatherings with Friends and Family: Three times a week    Attends Religious Services: Never    Active Member of Clubs or Organizations: No    Attends Music therapist: Never    Marital Status: Married    Tobacco Counseling Counseling given: Not Answered   Clinical Intake:  Pre-visit preparation completed: Yes  Pain : No/denies pain     Nutritional Risks: None Diabetes: No  How often do you need to have someone help you  when you read instructions, pamphlets, or other written materials from your doctor or pharmacy?: 1 - Never What is the last grade level you completed in school?: [place]   Interpreter Needed?: No  Information entered by :: L.Maleeyah Mccaughey,LPN   Activities of Daily Living    05/15/2022    1:44 PM 05/15/2022    1:31 PM  In your present state of health, do you have any difficulty performing the following activities:  Hearing? 0 0  Vision? 0 0  Difficulty concentrating or making decisions? 0 0  Walking or climbing stairs? 0 0  Dressing or bathing? 0 0  Doing errands, shopping? 0 0  Preparing Food and eating ? N N  Using the Toilet? N N  In the past six months, have you accidently leaked urine? N N  Do you have problems with loss of bowel control? N N  Managing your Medications? N N  Managing your Finances? N N  Housekeeping or managing your Housekeeping? N N    Patient Care Team: Midge Minium, MD as PCP - General (Family Medicine) Excell Seltzer, MD (Inactive) as Consulting Physician (General Surgery) Magrinat, Virgie Dad, MD (Inactive) as Consulting Physician (Oncology) Arloa Koh, MD (Inactive) as Consulting Physician (Radiation Oncology) Sanjuana Kava, MD as Referring Physician (Obstetrics and Gynecology) Mauri Pole, MD as Consulting Physician (  Gastroenterology) Boyd Kerbs, MD as Referring Physician (Specialist) Chucky May, MD as Consulting Physician (Psychiatry)  Indicate any recent Medical Services you may have received from other than [facility] providers in the past year (date may be approximate).     Assessment:   This is a routine wellness examination for Airis.  Hearing/Vision screen Vision Screening - Comments:: Annual eye exams   Dietary issues and exercise activities discussed: Current Exercise Habits: The patient does not participate in regular exercise at present, Exercise limited by: None identified   Goals Addressed    None    Depression Screen    05/15/2022    1:44 PM 05/15/2022    1:43 PM 04/03/2022   10:27 AM 02/27/2022   10:14 AM 10/04/2021    2:28 PM 08/31/2021   10:34 AM 04/17/2021   11:03 AM  PHQ 2/9 Scores  PHQ - 2 Score 0 0 _0 0  PHQ- 9 Score   _1 0    Fall Risk    05/15/2022    1:44 PM 05/15/2022    1:31 PM 05/11/2022   10:19 AM 04/03/2022   10:27 AM 02/27/2022   10:14 AM  Fall Risk   Falls in the past year? 0 0 0 0 0  Number falls in past yr: 0 0 0 0   Injury with Fall? 0 0 0 0   Risk for fall due to :    No Fall Risks No Fall Risks  Follow up Falls evaluation completed;Education provided   Falls evaluation completed Falls evaluation completed    [place]:  Any stairs in or around the home? No  If so, are there any without handrails? No  Home free of loose throw rugs in walkways, pet beds, electrical cords, etc? Yes  Adequate lighting in your home to reduce risk of falls? Yes   ASSISTIVE DEVICES UTILIZED TO PREVENT FALLS:  Life alert? No  Use of a cane, walker or w/c? No  Grab bars in the bathroom? No  Shower chair or bench in shower? yes Elevated toilet seat or a handicapped toilet? No     Cognitive Function:Normal cognitive status assessed by telephone conversation  by this Nurse Health Advisor. No abnormalities found.      01/07/2019    8:38 AM  MMSE - Mini Mental State Exam  Orientation to time 5  Orientation to Place 5  Registration 3  Attention/ Calculation 5  Recall 2  Language- name 2 objects 2  Language- repeat 1  Language- follow 3 step command 3  Language- read & follow direction 1  Write a sentence 1  Copy design 1  Total score 29        Immunizations Immunization History  Administered Date(s) Administered   Fluad Quad(high Dose 65+) 08/31/2021   Influenza,inj,Quad PF,6+ Mos 08/08/2019   Influenza-Unspecified 09/16/2018   PFIZER(Purple Top)SARS-COV-2 Vaccination 01/17/2020, 02/08/2020    Pneumococcal Conjugate-13 03/01/2021   Tdap 12/26/2011    TDAP status: Due, Education has been provided regarding the importance of this vaccine. Advised may receive this vaccine at local pharmacy or Health Dept. Aware to provide a copy of the vaccination record if obtained from local pharmacy or Health Dept. Verbalized acceptance and understanding.  Flu Vaccine status: Up to date  Pneumococcal vaccine status: Up to date  Covid-19 vaccine status: Completed vaccines  Qualifies for Shingles Vaccine? Yes   Zostavax completed No   Shingrix Completed?: No.  Education has been provided regarding the importance of this vaccine. Patient has been advised to call insurance company to determine out of pocket expense if they have not yet received this vaccine. Advised may also receive vaccine at local pharmacy or Health Dept. Verbalized acceptance and understanding.  Screening Tests Health Maintenance  Topic Date Due   COVID-19 Vaccine (3 - Pfizer risk series) 03/07/2020   Zoster Vaccines- Shingrix (1 of 2) 07/04/2022 (Originally 10/15/1975)   Pneumonia Vaccine 55+ Years old (2 - PPSV23 or PCV20) 04/04/2023 (Originally 03/01/2022)   TETANUS/TDAP  04/04/2023 (Originally 12/25/2021)   INFLUENZA VACCINE  06/05/2022   MAMMOGRAM  06/28/2022   COLONOSCOPY (Pts 45-32yr Insurance coverage will need to be confirmed)  04/16/2029   DEXA SCAN  Completed   Hepatitis C Screening  Completed   HPV VACCINES  Aged Out    Health Maintenance  Health Maintenance Due  Topic Date Due   COVID-19 Vaccine (3 - Pfizer risk series) 03/07/2020    Colorectal cancer screening: Type of screening: Colonoscopy. Completed 04/16/2022. Repeat every 7 years  Mammogram status: Completed 06/28/2022. Repeat every year  Bone Density status: Completed 06/28/2021. Results reflect: Bone density results: OSTEOPENIA. Repeat every 5 years.  Lung Cancer Screening: (Low Dose CT Chest recommended if Age 67-80years, 30 pack-year  currently smoking OR have quit w/in 15years.) does not qualify.   Lung Cancer Screening Referral: n/a  Additional Screening:  Hepatitis C Screening: does not qualify; completed 11/20/2017  Vision Screening: Recommended annual ophthalmology exams for early detection of glaucoma and other disorders of the eye. Is the patient up to date with their annual eye exam?  Yes  Who is the provider or what is the name of the office in which the patient attends annual eye exams? Dr.Groat  If pt is not established with a provider, would they like to be referred to a provider to establish care? No .   Dental Screening: Recommended annual dental exams for proper oral hygiene  Community Resource Referral / Chronic Care Management: CRR required this visit?  No   CCM required this visit?  No      Plan:     I have personally reviewed and noted the following in the patient's chart:   Medical and social history Use of alcohol, tobacco or illicit drugs  Current medications and supplements including opioid prescriptions. Patient is not currently taking opioid prescriptions. Functional ability and status Nutritional status Physical activity Advanced directives List of other physicians Hospitalizations, surgeries, and ER visits in previous 12 months Vitals Screenings to include cognitive, depression, and falls Referrals and appointments  In addition, I have reviewed and discussed with patient certain preventive protocols, quality metrics, and best practice recommendations. A written personalized care plan for preventive services as well as general preventive health recommendations were provided to patient.     LRandel Pigg LPN   [id number]  Nurse Notes: none

## 2022-05-15 NOTE — Patient Instructions (Signed)
Melissa Garza , Thank you for taking time to come for your Medicare Wellness Visit. I appreciate your ongoing commitment to your health goals. Please review the following plan we discussed and let me know if I can assist you in the future.   Screening recommendations/referrals: Colonoscopy: 04/16/2022 Mammogram: 06/28/2022 scheduled  Bone Density: 06/28/2021 Recommended yearly ophthalmology/optometry visit for glaucoma screening and checkup Recommended yearly dental visit for hygiene and checkup  Vaccinations: Influenza vaccine: completed  Pneumococcal vaccine: completed  Tdap vaccine: due  Shingles vaccine: will  consider    Advanced directives: yes   Conditions/risks identified: none   Next appointment: none    Preventive Care 3 Years and Older, Female Preventive care refers to lifestyle choices and visits with your health care provider that can promote health and wellness. What does preventive care include? A yearly physical exam. This is also called an annual well check. Dental exams once or twice a year. Routine eye exams. Ask your health care provider how often you should have your eyes checked. Personal lifestyle choices, including: Daily care of your teeth and gums. Regular physical activity. Eating a healthy diet. Avoiding tobacco and drug use. Limiting alcohol use. Practicing safe sex. Taking low-dose aspirin every day. Taking vitamin and mineral supplements as recommended by your health care provider. What happens during an annual well check? The services and screenings done by your health care provider during your annual well check will depend on your age, overall health, lifestyle risk factors, and family history of disease. Counseling  Your health care provider may ask you questions about your: Alcohol use. Tobacco use. Drug use. Emotional well-being. Home and relationship well-being. Sexual activity. Eating habits. History of falls. Memory and  ability to understand (cognition). Work and work Statistician. Reproductive health. Screening  You may have the following tests or measurements: Height, weight, and BMI. Blood pressure. Lipid and cholesterol levels. These may be checked every 5 years, or more frequently if you are over 73 years old. Skin check. Lung cancer screening. You may have this screening every year starting at age 72 if you have a 30-pack-year history of smoking and currently smoke or have quit within the past 15 years. Fecal occult blood test (FOBT) of the stool. You may have this test every year starting at age 85. Flexible sigmoidoscopy or colonoscopy. You may have a sigmoidoscopy every 5 years or a colonoscopy every 10 years starting at age 56. Hepatitis C blood test. Hepatitis B blood test. Sexually transmitted disease (STD) testing. Diabetes screening. This is done by checking your blood sugar (glucose) after you have not eaten for a while (fasting). You may have this done every 1-3 years. Bone density scan. This is done to screen for osteoporosis. You may have this done starting at age 14. Mammogram. This may be done every 1-2 years. Talk to your health care provider about how often you should have regular mammograms. Talk with your health care provider about your test results, treatment options, and if necessary, the need for more tests. Vaccines  Your health care provider may recommend certain vaccines, such as: Influenza vaccine. This is recommended every year. Tetanus, diphtheria, and acellular pertussis (Tdap, Td) vaccine. You may need a Td booster every 10 years. Zoster vaccine. You may need this after age 71. Pneumococcal 13-valent conjugate (PCV13) vaccine. One dose is recommended after age 30. Pneumococcal polysaccharide (PPSV23) vaccine. One dose is recommended after age 34. Talk to your health care provider about which screenings and vaccines you need  and how often you need them. This information is  not intended to replace advice given to you by your health care provider. Make sure you discuss any questions you have with your health care provider. Document Released: 11/18/2015 Document Revised: 07/11/2016 Document Reviewed: 08/23/2015 Elsevier Interactive Patient Education  2017 [place] Prevention in the Home Falls can cause injuries. They can happen to people of all ages. There are many things you can do to make your home safe and to help prevent falls. What can I do on the outside of my home? Regularly fix the edges of walkways and driveways and fix any cracks. Remove anything that might make you trip as you walk through a door, such as a raised step or threshold. Trim any bushes or trees on the path to your home. Use bright outdoor lighting. Clear any walking paths of anything that might make someone trip, such as rocks or tools. Regularly check to see if handrails are loose or broken. Make sure that both sides of any steps have handrails. Any raised decks and porches should have guardrails on the edges. Have any leaves, snow, or ice cleared regularly. Use sand or salt on walking paths during winter. Clean up any spills in your garage right away. This includes oil or grease spills. What can I do in the bathroom? Use night lights. Install grab bars by the toilet and in the tub and shower. Do not use towel bars as grab bars. Use non-skid mats or decals in the tub or shower. If you need to sit down in the shower, use a plastic, non-slip stool. Keep the floor dry. Clean up any water that spills on the floor as soon as it happens. Remove soap buildup in the tub or shower regularly. Attach bath mats securely with double-sided non-slip rug tape. Do not have throw rugs and other things on the floor that can make you trip. What can I do in the bedroom? Use night lights. Make sure that you have a light by your bed that is easy to reach. Do not use any sheets or blankets that  are too big for your bed. They should not hang down onto the floor. Have a firm chair that has side arms. You can use this for support while you get dressed. Do not have throw rugs and other things on the floor that can make you trip. What can I do in the kitchen? Clean up any spills right away. Avoid walking on wet floors. Keep items that you use a lot in easy-to-reach places. If you need to reach something above you, use a strong step stool that has a grab bar. Keep electrical cords out of the way. Do not use floor polish or wax that makes floors slippery. If you must use wax, use non-skid floor wax. Do not have throw rugs and other things on the floor that can make you trip. What can I do with my stairs? Do not leave any items on the stairs. Make sure that there are handrails on both sides of the stairs and use them. Fix handrails that are broken or loose. Make sure that handrails are as long as the stairways. Check any carpeting to make sure that it is firmly attached to the stairs. Fix any carpet that is loose or worn. Avoid having throw rugs at the top or bottom of the stairs. If you do have throw rugs, attach them to the floor with carpet tape. Make sure that you  have a light switch at the top of the stairs and the bottom of the stairs. If you do not have them, ask someone to add them for you. What else can I do to help prevent falls? Wear shoes that: Do not have high heels. Have rubber bottoms. Are comfortable and fit you well. Are closed at the toe. Do not wear sandals. If you use a stepladder: Make sure that it is fully opened. Do not climb a closed stepladder. Make sure that both sides of the stepladder are locked into place. Ask someone to hold it for you, if possible. Clearly mark and make sure that you can see: Any grab bars or handrails. First and last steps. Where the edge of each step is. Use tools that help you move around (mobility aids) if they are needed. These  include: Canes. Walkers. Scooters. Crutches. Turn on the lights when you go into a dark area. Replace any light bulbs as soon as they burn out. Set up your furniture so you have a clear path. Avoid moving your furniture around. If any of your floors are uneven, fix them. If there are any pets around you, be aware of where they are. Review your medicines with your doctor. Some medicines can make you feel dizzy. This can increase your chance of falling. Ask your doctor what other things that you can do to help prevent falls. This information is not intended to replace advice given to you by your health care provider. Make sure you discuss any questions you have with your health care provider. Document Released: 08/18/2009 Document Revised: 03/29/2016 Document Reviewed: 11/26/2014 Elsevier Interactive Patient Education  2017 [employer].

## 2022-05-17 ENCOUNTER — Ambulatory Visit: Payer: Medicare HMO | Admitting: Physician Assistant

## 2022-05-21 ENCOUNTER — Encounter: Payer: Self-pay | Admitting: Family Medicine

## 2022-05-21 ENCOUNTER — Ambulatory Visit (INDEPENDENT_AMBULATORY_CARE_PROVIDER_SITE_OTHER): Payer: Medicare HMO | Admitting: Family Medicine

## 2022-05-21 VITALS — BP 106/80 | HR 79 | Temp 97.9°F | Resp 16 | Ht 62.0 in | Wt 173.0 lb

## 2022-05-21 DIAGNOSIS — Z6833 Body mass index (BMI) 33.0-33.9, adult: Secondary | ICD-10-CM | POA: Insufficient documentation

## 2022-05-21 DIAGNOSIS — M810 Age-related osteoporosis without current pathological fracture: Secondary | ICD-10-CM | POA: Insufficient documentation

## 2022-05-21 DIAGNOSIS — F32A Depression, unspecified: Secondary | ICD-10-CM

## 2022-05-21 DIAGNOSIS — R69 Illness, unspecified: Secondary | ICD-10-CM | POA: Diagnosis not present

## 2022-05-21 DIAGNOSIS — F419 Anxiety disorder, unspecified: Secondary | ICD-10-CM

## 2022-05-21 DIAGNOSIS — Z853 Personal history of malignant neoplasm of breast: Secondary | ICD-10-CM | POA: Insufficient documentation

## 2022-05-21 DIAGNOSIS — E782 Mixed hyperlipidemia: Secondary | ICD-10-CM | POA: Insufficient documentation

## 2022-05-21 MED ORDER — BUPROPION HCL ER (XL) 300 MG PO TB24: 300.0000 mg | ORAL_TABLET | Freq: Every day | ORAL | 3 refills | Status: DC

## 2022-05-21 NOTE — Assessment & Plan Note (Signed)
Ongoing issue for pt.  Feels isolated since she stopped working and feels like she has lost her social network.  States she doesn't have any friends, doesn't do things w/ her husband.  Spends most of the day reading in bed.  Encouraged her to look into developing her interests- join a book club since she likes to read, try an exercise class- and look to socialize through these activities.  Will increase Wellbutrin to '300mg'$  daily.  Pt expressed understanding and is in agreement w/ plan.

## 2022-05-21 NOTE — Progress Notes (Signed)
   Subjective:    Patient ID: Melissa Garza, female    DOB: 01-31-1956, 66 y.o.   MRN: [id number]  HPI Anxiety/Depression- chronic problem, at last visit Citalopram was increased to '60mg'$  daily.  She is also on Wellbutrin '150mg'$  daily.  Pt reports she hasn't noticed a big change since increasing medication.  Husband reports she is 'less ill'.  Pt is frustrated that husband w/ endstage COPD is still smoking.  'i like to be in control'.     Review of Systems For ROS see HPI     Objective:   Physical Exam Vitals reviewed.  Constitutional:      General: She is not in acute distress.    Appearance: Normal appearance. She is not ill-appearing.  HENT:     Head: Normocephalic and atraumatic.  Skin:    General: Skin is warm and dry.  Neurological:     General: No focal deficit present.     Mental Status: She is alert and oriented to person, place, and time.  Psychiatric:        Mood and Affect: Mood normal.        Behavior: Behavior normal.        Thought Content: Thought content normal.          Assessment & Plan:

## 2022-05-21 NOTE — Patient Instructions (Addendum)
Follow up in 4-6 weeks to recheck mood INCREASE the Wellbutrin to '300mg'$  daily- 2 of what you currently have and 1 of the new prescription CONTINUE the Citalopram '60mg'$  daily Try and find something that makes you happy, and then surround yourself w/ people who enjoy the same Call with any questions or concerns Hang in there!!!

## 2022-07-02 ENCOUNTER — Ambulatory Visit (INDEPENDENT_AMBULATORY_CARE_PROVIDER_SITE_OTHER): Payer: Medicare HMO | Admitting: Family Medicine

## 2022-07-02 ENCOUNTER — Encounter: Payer: Self-pay | Admitting: Family Medicine

## 2022-07-02 VITALS — BP 118/82 | HR 110 | Temp 97.2°F | Resp 17 | Ht 62.0 in | Wt 172.5 lb

## 2022-07-02 DIAGNOSIS — F32A Depression, unspecified: Secondary | ICD-10-CM | POA: Diagnosis not present

## 2022-07-02 DIAGNOSIS — F419 Anxiety disorder, unspecified: Secondary | ICD-10-CM | POA: Diagnosis not present

## 2022-07-02 DIAGNOSIS — Z23 Encounter for immunization: Secondary | ICD-10-CM

## 2022-07-02 DIAGNOSIS — R69 Illness, unspecified: Secondary | ICD-10-CM | POA: Diagnosis not present

## 2022-07-02 NOTE — Assessment & Plan Note (Signed)
Pt reports improvement in mood and motivation.  Has less worry.  'I'm opening my eyes now'.  Will continue Wellbutrin '300mg'$  daily, Celexa '40mg'$  daily, and Alprazolam 1 mg PRN.  Pt expressed understanding and is in agreement w/ plan.

## 2022-07-02 NOTE — Progress Notes (Signed)
   Subjective:    Patient ID: Melissa Garza, female    DOB: 03-30-1956, 66 y.o.   MRN: [id number]  HPI Anxiety/Depression- at last visit we increased Wellbutrin to '300mg'$  daily.  Also on Celexa '40mg'$  daily and Alprazolam '1mg'$  PRN.  Pt reports 'i can tell a difference' since increasing to '300mg'$  daily.  Has some improvement in energy, mood, motivation.  Less worry.  Pt has some increased motivation.   Review of Systems For ROS see HPI     Objective:   Physical Exam Vitals reviewed.  Constitutional:      General: She is not in acute distress.    Appearance: Normal appearance. She is not ill-appearing.  HENT:     Head: Normocephalic and atraumatic.  Skin:    General: Skin is warm and dry.  Neurological:     General: No focal deficit present.     Mental Status: She is alert and oriented to person, place, and time.  Psychiatric:        Mood and Affect: Mood normal.        Behavior: Behavior normal.        Thought Content: Thought content normal.           Assessment & Plan:

## 2022-07-02 NOTE — Patient Instructions (Signed)
Follow up in October to recheck BP and cholesterol No need for labs today- yay! No med changes at this time Call with any questions or concerns Happy Labor Day!

## 2022-07-04 DIAGNOSIS — Z01419 Encounter for gynecological examination (general) (routine) without abnormal findings: Secondary | ICD-10-CM | POA: Diagnosis not present

## 2022-07-04 DIAGNOSIS — M810 Age-related osteoporosis without current pathological fracture: Secondary | ICD-10-CM | POA: Diagnosis not present

## 2022-07-04 DIAGNOSIS — Z780 Asymptomatic menopausal state: Secondary | ICD-10-CM | POA: Diagnosis not present

## 2022-07-04 DIAGNOSIS — Z6833 Body mass index (BMI) 33.0-33.9, adult: Secondary | ICD-10-CM | POA: Diagnosis not present

## 2022-07-04 DIAGNOSIS — Z1231 Encounter for screening mammogram for malignant neoplasm of breast: Secondary | ICD-10-CM | POA: Diagnosis not present

## 2022-07-04 DIAGNOSIS — M25519 Pain in unspecified shoulder: Secondary | ICD-10-CM | POA: Diagnosis not present

## 2022-07-04 LAB — HM PAP SMEAR

## 2022-07-05 ENCOUNTER — Encounter: Payer: Self-pay | Admitting: Family Medicine

## 2022-07-06 ENCOUNTER — Other Ambulatory Visit: Payer: Self-pay | Admitting: Obstetrics and Gynecology

## 2022-07-06 DIAGNOSIS — M810 Age-related osteoporosis without current pathological fracture: Secondary | ICD-10-CM

## 2022-07-10 LAB — HM MAMMOGRAPHY

## 2022-07-12 ENCOUNTER — Other Ambulatory Visit: Payer: Self-pay

## 2022-07-12 MED ORDER — TRAZODONE HCL 50 MG PO TABS: 50.0000 mg | ORAL_TABLET | Freq: Every day | ORAL | 0 refills | Status: DC

## 2022-07-26 ENCOUNTER — Telehealth: Payer: Self-pay | Admitting: Family Medicine

## 2022-07-26 ENCOUNTER — Other Ambulatory Visit: Payer: Self-pay

## 2022-07-26 ENCOUNTER — Other Ambulatory Visit: Payer: Self-pay | Admitting: Family

## 2022-07-26 ENCOUNTER — Other Ambulatory Visit: Payer: Self-pay | Admitting: Family Medicine

## 2022-07-26 ENCOUNTER — Other Ambulatory Visit: Payer: Self-pay | Admitting: Lab

## 2022-07-26 MED ORDER — BUPROPION HCL ER (XL) 300 MG PO TB24: 300.0000 mg | ORAL_TABLET | Freq: Every day | ORAL | 3 refills | Status: DC

## 2022-07-26 MED ORDER — CITALOPRAM HYDROBROMIDE 40 MG PO TABS: 60.0000 mg | ORAL_TABLET | Freq: Every day | ORAL | 1 refills | Status: DC

## 2022-07-26 NOTE — Telephone Encounter (Signed)
Encourage patient to contact the pharmacy for refills or they can request refills through [facility]  (Please schedule appointment if patient has not been seen in over a year)    [facility] [place] [facility] [phone]  MEDICATION NAME & DOSE: bupropion 300 mg AND citalopram 40 mg  NOTES/COMMENTS FROM PATIENT: ignore mychart message, this is correct refill request per pt      Front office please notify patient: It takes 48-72 hours to process rx refill requests Ask patient to call pharmacy to ensure rx is ready before heading there.

## 2022-07-26 NOTE — Telephone Encounter (Signed)
Both medications have been [facility], patient is aware

## 2022-07-26 NOTE — Telephone Encounter (Signed)
Encourage patient to contact the pharmacy for refills or they can request refills through [facility]  (Please schedule appointment if patient has not been seen in over a year)    [facility] [place] [facility] [phone]  MEDICATION NAME & DOSE: bupropion 300 mg AND citalopram 40 mg  NOTES/COMMENTS FROM PATIENT: ignore mychart message, this is correct refill request per pt      Front office please notify patient: It takes 48-72 hours to process rx refill requests Ask patient to call pharmacy to ensure rx is ready before heading there.

## 2022-08-02 ENCOUNTER — Other Ambulatory Visit: Payer: Self-pay | Admitting: Family Medicine

## 2022-08-03 DIAGNOSIS — G43909 Migraine, unspecified, not intractable, without status migrainosus: Secondary | ICD-10-CM | POA: Diagnosis not present

## 2022-08-03 DIAGNOSIS — E669 Obesity, unspecified: Secondary | ICD-10-CM | POA: Diagnosis not present

## 2022-08-03 DIAGNOSIS — F419 Anxiety disorder, unspecified: Secondary | ICD-10-CM | POA: Diagnosis not present

## 2022-08-03 DIAGNOSIS — Z008 Encounter for other general examination: Secondary | ICD-10-CM | POA: Diagnosis not present

## 2022-08-03 DIAGNOSIS — Z6833 Body mass index (BMI) 33.0-33.9, adult: Secondary | ICD-10-CM | POA: Diagnosis not present

## 2022-08-03 DIAGNOSIS — Z604 Social exclusion and rejection: Secondary | ICD-10-CM | POA: Diagnosis not present

## 2022-08-03 DIAGNOSIS — E785 Hyperlipidemia, unspecified: Secondary | ICD-10-CM | POA: Diagnosis not present

## 2022-08-03 DIAGNOSIS — R69 Illness, unspecified: Secondary | ICD-10-CM | POA: Diagnosis not present

## 2022-08-03 DIAGNOSIS — F329 Major depressive disorder, single episode, unspecified: Secondary | ICD-10-CM | POA: Diagnosis not present

## 2022-08-03 DIAGNOSIS — G4700 Insomnia, unspecified: Secondary | ICD-10-CM | POA: Diagnosis not present

## 2022-08-03 DIAGNOSIS — Z591 Inadequate housing, unspecified: Secondary | ICD-10-CM | POA: Diagnosis not present

## 2022-08-03 DIAGNOSIS — H409 Unspecified glaucoma: Secondary | ICD-10-CM | POA: Diagnosis not present

## 2022-08-03 DIAGNOSIS — R32 Unspecified urinary incontinence: Secondary | ICD-10-CM | POA: Diagnosis not present

## 2022-08-03 DIAGNOSIS — I10 Essential (primary) hypertension: Secondary | ICD-10-CM | POA: Diagnosis not present

## 2022-08-13 DIAGNOSIS — L400 Psoriasis vulgaris: Secondary | ICD-10-CM | POA: Diagnosis not present

## 2022-08-13 DIAGNOSIS — D224 Melanocytic nevi of scalp and neck: Secondary | ICD-10-CM | POA: Diagnosis not present

## 2022-08-13 DIAGNOSIS — Z79899 Other long term (current) drug therapy: Secondary | ICD-10-CM | POA: Diagnosis not present

## 2022-08-13 DIAGNOSIS — L820 Inflamed seborrheic keratosis: Secondary | ICD-10-CM | POA: Diagnosis not present

## 2022-08-13 DIAGNOSIS — M25512 Pain in left shoulder: Secondary | ICD-10-CM | POA: Diagnosis not present

## 2022-08-21 DIAGNOSIS — M25512 Pain in left shoulder: Secondary | ICD-10-CM | POA: Diagnosis not present

## 2022-08-31 DIAGNOSIS — M810 Age-related osteoporosis without current pathological fracture: Secondary | ICD-10-CM | POA: Diagnosis not present

## 2022-09-03 ENCOUNTER — Encounter: Payer: Self-pay | Admitting: Family Medicine

## 2022-09-03 ENCOUNTER — Ambulatory Visit (INDEPENDENT_AMBULATORY_CARE_PROVIDER_SITE_OTHER): Payer: Medicare HMO | Admitting: Family Medicine

## 2022-09-03 VITALS — BP 116/82 | HR 77 | Temp 97.0°F | Resp 17 | Ht 62.0 in | Wt 172.2 lb

## 2022-09-03 DIAGNOSIS — R69 Illness, unspecified: Secondary | ICD-10-CM | POA: Diagnosis not present

## 2022-09-03 DIAGNOSIS — E559 Vitamin D deficiency, unspecified: Secondary | ICD-10-CM | POA: Diagnosis not present

## 2022-09-03 DIAGNOSIS — F32A Depression, unspecified: Secondary | ICD-10-CM

## 2022-09-03 DIAGNOSIS — F419 Anxiety disorder, unspecified: Secondary | ICD-10-CM

## 2022-09-03 DIAGNOSIS — I10 Essential (primary) hypertension: Secondary | ICD-10-CM

## 2022-09-03 DIAGNOSIS — E785 Hyperlipidemia, unspecified: Secondary | ICD-10-CM

## 2022-09-03 DIAGNOSIS — K219 Gastro-esophageal reflux disease without esophagitis: Secondary | ICD-10-CM | POA: Diagnosis not present

## 2022-09-03 LAB — CBC WITH DIFFERENTIAL/PLATELET
Basophils Absolute: 0.1 10*3/uL (ref 0.0–0.1)
Basophils Relative: 0.8 % (ref 0.0–3.0)
Eosinophils Absolute: 0.2 10*3/uL (ref 0.0–0.7)
Eosinophils Relative: 2.3 % (ref 0.0–5.0)
HCT: 40.7 % (ref 36.0–46.0)
Hemoglobin: 13.5 g/dL (ref 12.0–15.0)
Lymphocytes Relative: 26.8 % (ref 12.0–46.0)
Lymphs Abs: 2.3 10*3/uL (ref 0.7–4.0)
MCHC: 33.1 g/dL (ref 30.0–36.0)
MCV: 97.3 fl (ref 78.0–100.0)
Monocytes Absolute: 0.5 10*3/uL (ref 0.1–1.0)
Monocytes Relative: 5.4 % (ref 3.0–12.0)
Neutro Abs: 5.5 10*3/uL (ref 1.4–7.7)
Neutrophils Relative %: 64.7 % (ref 43.0–77.0)
Platelets: 184 10*3/uL (ref 150.0–400.0)
RBC: 4.18 Mil/uL (ref 3.87–5.11)
RDW: 13.7 % (ref 11.5–15.5)
WBC: 8.5 10*3/uL (ref 4.0–10.5)

## 2022-09-03 LAB — TSH: TSH: 0.88 u[IU]/mL (ref 0.35–5.50)

## 2022-09-03 LAB — BASIC METABOLIC PANEL
BUN: 13 mg/dL (ref 6–23)
CO2: 26 mEq/L (ref 19–32)
Calcium: 8.5 mg/dL (ref 8.4–10.5)
Chloride: 107 mEq/L (ref 96–112)
Creatinine, Ser: 1.04 mg/dL (ref 0.40–1.20)
GFR: 55.86 mL/min — ABNORMAL LOW (ref >60.00)
Glucose, Bld: 95 mg/dL (ref 70–99)
Potassium: 3.7 mEq/L (ref 3.5–5.1)
Sodium: 139 mEq/L (ref 135–145)

## 2022-09-03 LAB — LIPID PANEL
Cholesterol: 143 mg/dL (ref 0–200)
HDL: 51.3 mg/dL (ref >39.00)
LDL Cholesterol: 69 mg/dL (ref 0–99)
NonHDL: 91.91
Total CHOL/HDL Ratio: 3
Triglycerides: 116 mg/dL (ref 0.0–149.0)
VLDL: 23.2 mg/dL (ref 0.0–40.0)

## 2022-09-03 LAB — HEPATIC FUNCTION PANEL
ALT: 17 U/L (ref 0–35)
AST: 13 U/L (ref 0–37)
Albumin: 3.7 g/dL (ref 3.5–5.2)
Alkaline Phosphatase: 68 U/L (ref 39–117)
Bilirubin, Direct: 0.1 mg/dL (ref 0.0–0.3)
Total Bilirubin: 0.4 mg/dL (ref 0.2–1.2)
Total Protein: 6.3 g/dL (ref 6.0–8.3)

## 2022-09-03 LAB — VITAMIN D 25 HYDROXY (VIT D DEFICIENCY, FRACTURES): VITD: 69.92 ng/mL (ref 30.00–100.00)

## 2022-09-03 MED ORDER — FAMOTIDINE 40 MG PO TABS: 40.0000 mg | ORAL_TABLET | Freq: Every day | ORAL | 3 refills | Status: DC

## 2022-09-03 NOTE — Assessment & Plan Note (Signed)
New.  Pt reports she has had sxs for a few weeks.  Will wake at night w/ urge to throw up.  Sxs improve w/ eating.  Start Famotidine '40mg'$  daily and monitor for improvement.  Pt expressed understanding and is in agreement w/ plan.

## 2022-09-03 NOTE — Assessment & Plan Note (Signed)
Ongoing issue.  Pt feels mood is 'ok' on Wellbutrin XL '300mg'$  daily and Citalopram '60mg'$  daily.  This is despite scoring a 12 on PHQ9.  No med changes at this time.  Will continue to follow.

## 2022-09-03 NOTE — Assessment & Plan Note (Signed)
Chronic problem.  On Crestor 10mg daily w/o difficulty.  Check labs.  Adjust meds prn  

## 2022-09-03 NOTE — Progress Notes (Signed)
   Subjective:    Patient ID: Melissa Garza, female    DOB: January 28, 1956, 66 y.o.   MRN: [id number]  HPI HTN- ongoing issue, on Metoprolol XL '25mg'$  w/ good control.  No CP, SOB, visual changes, edema.  Increase in HAs recently- L sided, 'they're not bad'.    Hyperlipidemia- chronic problem, on Crestor '10mg'$  daily.  Denies abd pain, N/V.  Depression- chronic problem, on Wellbutrin XL '300mg'$  daily, Citalopram '60mg'$  daily  GERD- new issue for pt.  Reports she is waking at night w/ urge to throw up.  Sxs improve w/ eating.   Review of Systems For ROS see HPI     Objective:   Physical Exam Vitals reviewed.  Constitutional:      General: She is not in acute distress.    Appearance: Normal appearance. She is well-developed. She is not ill-appearing.  HENT:     Head: Normocephalic and atraumatic.  Eyes:     Conjunctiva/sclera: Conjunctivae normal.     Pupils: Pupils are equal, round, and reactive to light.  Neck:     Thyroid: No thyromegaly.  Cardiovascular:     Rate and Rhythm: Normal rate and regular rhythm.     Pulses: Normal pulses.     Heart sounds: Normal heart sounds. No murmur heard. Pulmonary:     Effort: Pulmonary effort is normal. No respiratory distress.     Breath sounds: Normal breath sounds.  Abdominal:     General: There is no distension.     Palpations: Abdomen is soft.     Tenderness: There is no abdominal tenderness.  Musculoskeletal:     Cervical back: Normal range of motion and neck supple.     Right lower leg: No edema.     Left lower leg: No edema.  Lymphadenopathy:     Cervical: No cervical adenopathy.  Skin:    General: Skin is warm and dry.  Neurological:     Mental Status: She is alert and oriented to person, place, and time.  Psychiatric:        Behavior: Behavior normal.           Assessment & Plan:

## 2022-09-03 NOTE — Assessment & Plan Note (Signed)
Check Vit D level for Dr Chalmers Cater due to upcoming Prolia injxn

## 2022-09-03 NOTE — Patient Instructions (Signed)
Schedule your complete physical in 6 months We'll notify you of your lab results and make any changes if needed START the Famotidine once daily to improve the nausea and reflux Make sure you are eating regularly to avoid an empty stomach (which will worsen reflux) Call with any questions or concerns Stay Safe!  Stay Healthy! Hang in there!!!

## 2022-09-03 NOTE — Assessment & Plan Note (Signed)
Chronic problem.  Well controlled on Metoprolol XL '25mg'$  daily.  No med changes at this time

## 2022-09-04 NOTE — Progress Notes (Signed)
Informed pt of lab results and waiting on her to call back with who Dr [name] is

## 2022-09-05 DIAGNOSIS — H401233 Low-tension glaucoma, bilateral, severe stage: Secondary | ICD-10-CM | POA: Diagnosis not present

## 2022-09-05 DIAGNOSIS — H43811 Vitreous degeneration, right eye: Secondary | ICD-10-CM | POA: Diagnosis not present

## 2022-09-05 DIAGNOSIS — H04123 Dry eye syndrome of bilateral lacrimal glands: Secondary | ICD-10-CM | POA: Diagnosis not present

## 2022-09-06 ENCOUNTER — Telehealth: Payer: Self-pay | Admitting: Lab

## 2022-09-06 NOTE — Telephone Encounter (Signed)
Received a final report of her mammogram from the front  and put it in Dr. Rande Lawman folder to be reviewed

## 2022-09-10 ENCOUNTER — Encounter: Payer: Self-pay | Admitting: Family Medicine

## 2022-09-21 ENCOUNTER — Other Ambulatory Visit: Payer: Self-pay

## 2022-09-21 MED ORDER — TRAZODONE HCL 50 MG PO TABS: 50.0000 mg | ORAL_TABLET | Freq: Every day | ORAL | 0 refills | Status: DC

## 2022-10-02 ENCOUNTER — Other Ambulatory Visit: Payer: Self-pay

## 2022-10-02 MED ORDER — ALPRAZOLAM 1 MG PO TABS: 1.0000 mg | ORAL_TABLET | Freq: Three times a day (TID) | ORAL | 1 refills | Status: DC | PRN

## 2022-10-02 NOTE — Telephone Encounter (Signed)
Patient is requesting a refill of the following medications: Requested Prescriptions   Pending Prescriptions Disp Refills   ALPRAZolam (XANAX) 1 MG tablet 100 tablet 1    Sig: Take 1 tablet (1 mg total) by mouth 3 (three) times daily as needed for anxiety.    Date of patient request: 10/02/22 Last office visit: 09/03/22 Date of last refill: 04/17/22 Last refill amount: 100   Patient notes she has requested this several times from the pharmacy without response I do not see any requests

## 2022-10-25 ENCOUNTER — Other Ambulatory Visit: Payer: Self-pay

## 2022-10-25 ENCOUNTER — Other Ambulatory Visit: Payer: Self-pay | Admitting: Family Medicine

## 2022-10-25 DIAGNOSIS — I10 Essential (primary) hypertension: Secondary | ICD-10-CM

## 2022-10-25 MED ORDER — METOPROLOL SUCCINATE ER 25 MG PO TB24: 25.0000 mg | ORAL_TABLET | Freq: Every day | ORAL | 3 refills | Status: DC

## 2022-10-25 MED ORDER — TRAZODONE HCL 50 MG PO TABS: 50.0000 mg | ORAL_TABLET | Freq: Every day | ORAL | 0 refills | Status: DC

## 2022-11-19 ENCOUNTER — Other Ambulatory Visit: Payer: Self-pay

## 2022-11-19 DIAGNOSIS — E785 Hyperlipidemia, unspecified: Secondary | ICD-10-CM

## 2022-11-19 DIAGNOSIS — F419 Anxiety disorder, unspecified: Secondary | ICD-10-CM

## 2022-11-19 MED ORDER — ROSUVASTATIN CALCIUM 10 MG PO TABS: 10.0000 mg | ORAL_TABLET | Freq: Every day | ORAL | 3 refills | Status: DC

## 2022-11-19 MED ORDER — CITALOPRAM HYDROBROMIDE 40 MG PO TABS: 60.0000 mg | ORAL_TABLET | Freq: Every day | ORAL | 1 refills | Status: DC

## 2022-11-23 ENCOUNTER — Telehealth: Payer: Self-pay | Admitting: Family Medicine

## 2022-11-23 ENCOUNTER — Telehealth: Payer: Self-pay

## 2022-11-23 NOTE — Telephone Encounter (Signed)
We have faxed a new Rx for the Celexa and they have not sent a refill for the Xanax  I am calling [facility] as I type this

## 2022-11-23 NOTE — Telephone Encounter (Signed)
[facility] is stating they need a PA for pt Melissa Garza 40 mg pt takes 1.5 tablet daily to equal 60 mg and they say the amount exceeds plans limit Dr Birdie Riddle states she has to have 1.5 tablets  not one tablet daily please obtain PA thanks

## 2022-11-23 NOTE — Telephone Encounter (Signed)
I informed the pt that I spoke [facility] and they are shipping out her Xanax 1 mg in the next day or two and that the Celexa needed a PA for the quantity amount to be changed ., Insurance will only pay for one tablet daily . Submitted a PA to the pharmacy team and pt expressed verbal understanding

## 2022-11-23 NOTE — Telephone Encounter (Signed)
Caller name: TANNISHA KENNINGTON  On DPR?: Yes  Call back number: [phone] (mobile)  Provider they see: Midge Minium, MD  Reason for call: Patient called stating that [facility] emailed her yesterday about her Citalopram 40 mg. Patients stated that the email  states patient needs a new prescription for this medication.  Patient also she received a letter in the mail from [facility] about her medication alprazolam 1 mg. [facility] wants to know why this patient need this medication. This is all stated by patient. Prescription number is [id number].

## 2022-11-28 NOTE — Telephone Encounter (Signed)
Has a  PA been submitted for this pt Celexa 40 mg? She needs on obtained due to quaintly the insurance will not cover . She take 1. 5 tablet daily for 90 days

## 2022-11-29 NOTE — Telephone Encounter (Signed)
I have sent a message that the pt Celexa needs a PA due to qty . She takes Celexa 40 mg 1.5 mg daily and the insurance states it needs a PA for qty.  Can we please get this she is out ?

## 2022-12-04 NOTE — Telephone Encounter (Signed)
I spoke to [facility] C from [employer] and we verbally started a PA for pt Celexa 40 mg take 1.5 mg tablet daily disp 135 for a 90 day supply. They state it can take up to 72 hours for a response .

## 2022-12-07 ENCOUNTER — Other Ambulatory Visit: Payer: Self-pay | Admitting: Family Medicine

## 2022-12-07 DIAGNOSIS — F32A Depression, unspecified: Secondary | ICD-10-CM

## 2022-12-07 MED ORDER — CITALOPRAM HYDROBROMIDE 40 MG PO TABS: 60.0000 mg | ORAL_TABLET | Freq: Every day | ORAL | 1 refills | Status: DC

## 2022-12-07 MED ORDER — TRAZODONE HCL 50 MG PO TABS: 50.0000 mg | ORAL_TABLET | Freq: Every day | ORAL | 0 refills | Status: DC

## 2022-12-07 NOTE — Telephone Encounter (Signed)
Patient is requesting a refill of the following medications: Requested Prescriptions   Pending Prescriptions Disp Refills   citalopram (CELEXA) 40 MG tablet 90 tablet 1    Sig: Take 1.5 tablets (60 mg total) by mouth daily.   traZODone (DESYREL) 50 MG tablet 30 tablet 0    Sig: Take 1 tablet (50 mg total) by mouth at bedtime.    Date of patient request: 12/07/22 Last office visit: 09/03/2022 Date of last refill: 11/08/2022 Last refill amount: 90, 30    Patient has changed the pharmacy from mail order to local

## 2022-12-07 NOTE — Telephone Encounter (Signed)
Encourage patient to contact the pharmacy for refills or they can request refills through [place] TO:  [facility] [place]  [phone]   MEDICATION NAME & DOSE: citalopram (CELEXA) 40 MG tablet  traZODone (DESYREL) 50 MG tablet   NOTES/COMMENTS FROM PATIENT:      [place] office please notify patient: It takes 48-72 hours to process rx refill requests Ask patient to call pharmacy to ensure rx is ready before heading there.

## 2022-12-07 NOTE — Telephone Encounter (Signed)
Patient is aware her medications were sent in

## 2022-12-20 ENCOUNTER — Other Ambulatory Visit (HOSPITAL_COMMUNITY): Payer: Self-pay

## 2022-12-20 ENCOUNTER — Encounter: Payer: Self-pay | Admitting: Nurse Practitioner

## 2022-12-21 ENCOUNTER — Other Ambulatory Visit (HOSPITAL_COMMUNITY): Payer: Self-pay

## 2022-12-24 ENCOUNTER — Other Ambulatory Visit (HOSPITAL_COMMUNITY): Payer: Self-pay

## 2022-12-24 NOTE — Telephone Encounter (Signed)
Pharmacy Patient Advocate Encounter  Prior Authorization for CITALOPRAM has been approved.   TYPE OF COVERAGE APPROVED: QUANTITY LIMITS   Effective dates: 11/05/2022 through 11/05/2023  [place] [facility] Patient Advocate

## 2022-12-24 NOTE — Telephone Encounter (Signed)
Patient Advocate Encounter  Prior Authorization for Citalopram has been approved.     Effective dates:Prior authorization exprires 11/05/2023

## 2022-12-24 NOTE — Telephone Encounter (Signed)
Informed pt that her Celexa has been approved

## 2022-12-27 ENCOUNTER — Other Ambulatory Visit: Payer: Self-pay | Admitting: Family Medicine

## 2023-01-08 DIAGNOSIS — H401233 Low-tension glaucoma, bilateral, severe stage: Secondary | ICD-10-CM | POA: Diagnosis not present

## 2023-01-08 DIAGNOSIS — H04123 Dry eye syndrome of bilateral lacrimal glands: Secondary | ICD-10-CM | POA: Diagnosis not present

## 2023-01-08 DIAGNOSIS — H43811 Vitreous degeneration, right eye: Secondary | ICD-10-CM | POA: Diagnosis not present

## 2023-02-08 ENCOUNTER — Telehealth: Payer: Self-pay | Admitting: Family Medicine

## 2023-02-08 ENCOUNTER — Other Ambulatory Visit: Payer: Self-pay

## 2023-02-08 DIAGNOSIS — F32A Anxiety disorder, unspecified: Secondary | ICD-10-CM

## 2023-02-08 MED ORDER — TRAZODONE HCL 50 MG PO TABS: 50.0000 mg | ORAL_TABLET | Freq: Every day | ORAL | 3 refills | Status: DC

## 2023-02-08 NOTE — Telephone Encounter (Signed)
Encourage patient to contact the pharmacy for refills or they can request refills through [facility]   WHAT PHARMACY WOULD THEY LIKE THIS SENT TO:  [facility] - [place], [place] - [address] [facility] AT Portal to [facility]    MEDICATION NAME & DOSE: traZODone (DESYREL) 50 MG tablet    NOTES/COMMENTS FROM PATIENT:      Front office please notify patient: It takes 48-72 hours to process rx refill requests Ask patient to call pharmacy to ensure rx is ready before heading there.

## 2023-02-08 NOTE — Telephone Encounter (Signed)
Rx has been sent to [facility]

## 2023-02-15 ENCOUNTER — Other Ambulatory Visit: Payer: Self-pay | Admitting: Family Medicine

## 2023-02-22 ENCOUNTER — Telehealth: Payer: Self-pay | Admitting: Family Medicine

## 2023-02-22 NOTE — Telephone Encounter (Signed)
Called patient to schedule Medicare Annual Wellness Visit (AWV). Left message for patient to call back and schedule Medicare Annual Wellness Visit (AWV).  Last date of AWV: 05/15/2022   Please schedule an AWVS appointment at any time with [facility] ANNUAL WELLNESS VISIT.  Monia Pouch is calendar year.  If any questions, please contact me at [phone].    Thank you,  [facility] Support [facility] Direct dial  [phone]

## 2023-03-05 DIAGNOSIS — M810 Age-related osteoporosis without current pathological fracture: Secondary | ICD-10-CM | POA: Diagnosis not present

## 2023-03-06 ENCOUNTER — Telehealth: Payer: Self-pay | Admitting: Family Medicine

## 2023-03-06 ENCOUNTER — Other Ambulatory Visit: Payer: Self-pay

## 2023-03-06 DIAGNOSIS — G43709 Chronic migraine without aura, not intractable, without status migrainosus: Secondary | ICD-10-CM

## 2023-03-06 MED ORDER — TOPIRAMATE 100 MG PO TABS
100.0000 mg | ORAL_TABLET | Freq: Every day | ORAL | 0 refills | Status: DC
Start: 2023-03-06 — End: 2023-04-25

## 2023-03-06 NOTE — Telephone Encounter (Signed)
Fixed.

## 2023-03-06 NOTE — Telephone Encounter (Signed)
Contacted Melissa Garza to schedule their annual wellness visit. Appointment made for 03/13/2023.  Thank you,  [facility] Support [facility] Direct dial  [phone]

## 2023-03-06 NOTE — Telephone Encounter (Signed)
Encourage patient to contact the pharmacy for refills or they can request refills through [facility]  (Please schedule appointment if patient has not been seen in over a year) Last ov was 09/03/22   WHAT PHARMACY WOULD THEY LIKE THIS SENT TO: [facility] [address] [place], [place] - [address] AT [facility] [address]   MEDICATION NAME & DOSE:topiramate (TOPAMAX) 100 MG tablet   NOTES/COMMENTS FROM PATIENT:      Front office please notify patient: It takes 48-72 hours to process rx refill requests Ask patient to call pharmacy to ensure rx is ready before heading there.

## 2023-03-13 ENCOUNTER — Ambulatory Visit (INDEPENDENT_AMBULATORY_CARE_PROVIDER_SITE_OTHER): Payer: Medicare HMO | Admitting: *Deleted

## 2023-03-13 DIAGNOSIS — Z0000 Encounter for general adult medical examination without abnormal findings: Secondary | ICD-10-CM

## 2023-03-13 NOTE — Patient Instructions (Signed)
Ms. Melissa Garza , Thank you for taking time to come for your Medicare Wellness Visit. I appreciate your ongoing commitment to your health goals. Please review the following plan we discussed and let me know if I can assist you in the future.   Screening recommendations/referrals: Colonoscopy: up to date Mammogram: up to date Bone Density: Education provided Recommended yearly ophthalmology/optometry visit for glaucoma screening and checkup Recommended yearly dental visit for hygiene and checkup  Vaccinations: Influenza vaccine: up to date Pneumococcal vaccine: Education provided Tdap vaccine: Education provided Shingles vaccine: Education provided           Preventive Care 65 Years and Older, Female Preventive care refers to lifestyle choices and visits with your health care provider that can promote health and wellness. What does preventive care include? A yearly physical exam. This is also called an annual well check. Dental exams once or twice a year. Routine eye exams. Ask your health care provider how often you should have your eyes checked. Personal lifestyle choices, including: Daily care of your teeth and gums. Regular physical activity. Eating a healthy diet. Avoiding tobacco and drug use. Limiting alcohol use. Practicing safe sex. Taking low-dose aspirin every day. Taking vitamin and mineral supplements as recommended by your health care provider. What happens during an annual well check? The services and screenings done by your health care provider during your annual well check will depend on your age, overall health, lifestyle risk factors, and family history of disease. Counseling  Your health care provider may ask you questions about your: Alcohol use. Tobacco use. Drug use. Emotional well-being. Home and relationship well-being. Sexual activity. Eating habits. History of falls. Memory and ability to understand (cognition). Work and work  Astronomer. Reproductive health. Screening  You may have the following tests or measurements: Height, weight, and BMI. Blood pressure. Lipid and cholesterol levels. These may be checked every 5 years, or more frequently if you are over 41 years old. Skin check. Lung cancer screening. You may have this screening every year starting at age 87 if you have a 30-pack-year history of smoking and currently smoke or have quit within the past 15 years. Fecal occult blood test (FOBT) of the stool. You may have this test every year starting at age 84. Flexible sigmoidoscopy or colonoscopy. You may have a sigmoidoscopy every 5 years or a colonoscopy every 10 years starting at age 71. Hepatitis C blood test. Hepatitis B blood test. Sexually transmitted disease (STD) testing. Diabetes screening. This is done by checking your blood sugar (glucose) after you have not eaten for a while (fasting). You may have this done every 1-3 years. Bone density scan. This is done to screen for osteoporosis. You may have this done starting at age 62. Mammogram. This may be done every 1-2 years. Talk to your health care provider about how often you should have regular mammograms. Talk with your health care provider about your test results, treatment options, and if necessary, the need for more tests. Vaccines  Your health care provider may recommend certain vaccines, such as: Influenza vaccine. This is recommended every year. Tetanus, diphtheria, and acellular pertussis (Tdap, Td) vaccine. You may need a Td booster every 10 years. Zoster vaccine. You may need this after age 49. Pneumococcal 13-valent conjugate (PCV13) vaccine. One dose is recommended after age 60. Pneumococcal polysaccharide (PPSV23) vaccine. One dose is recommended after age 73. Talk to your health care provider about which screenings and vaccines you need and how often you need them.  This information is not intended to replace advice given to you by  your health care provider. Make sure you discuss any questions you have with your health care provider. Document Released: 11/18/2015 Document Revised: 07/11/2016 Document Reviewed: 08/23/2015 Elsevier Interactive Patient Education  2017 [place] Prevention in the Home Falls can cause injuries. They can happen to people of all ages. There are many things you can do to make your home safe and to help prevent falls. What can I do on the outside of my home? Regularly fix the edges of walkways and driveways and fix any cracks. Remove anything that might make you trip as you walk through a door, such as a raised step or threshold. Trim any bushes or trees on the path to your home. Use bright outdoor lighting. Clear any walking paths of anything that might make someone trip, such as rocks or tools. Regularly check to see if handrails are loose or broken. Make sure that both sides of any steps have handrails. Any raised decks and porches should have guardrails on the edges. Have any leaves, snow, or ice cleared regularly. Use sand or salt on walking paths during winter. Clean up any spills in your garage right away. This includes oil or grease spills. What can I do in the bathroom? Use night lights. Install grab bars by the toilet and in the tub and shower. Do not use towel bars as grab bars. Use non-skid mats or decals in the tub or shower. If you need to sit down in the shower, use a plastic, non-slip stool. Keep the floor dry. Clean up any water that spills on the floor as soon as it happens. Remove soap buildup in the tub or shower regularly. Attach bath mats securely with double-sided non-slip rug tape. Do not have throw rugs and other things on the floor that can make you trip. What can I do in the bedroom? Use night lights. Make sure that you have a light by your bed that is easy to reach. Do not use any sheets or blankets that are too big for your bed. They should not hang  down onto the floor. Have a firm chair that has side arms. You can use this for support while you get dressed. Do not have throw rugs and other things on the floor that can make you trip. What can I do in the kitchen? Clean up any spills right away. Avoid walking on wet floors. Keep items that you use a lot in easy-to-reach places. If you need to reach something above you, use a strong step stool that has a grab bar. Keep electrical cords out of the way. Do not use floor polish or wax that makes floors slippery. If you must use wax, use non-skid floor wax. Do not have throw rugs and other things on the floor that can make you trip. What can I do with my stairs? Do not leave any items on the stairs. Make sure that there are handrails on both sides of the stairs and use them. Fix handrails that are broken or loose. Make sure that handrails are as long as the stairways. Check any carpeting to make sure that it is firmly attached to the stairs. Fix any carpet that is loose or worn. Avoid having throw rugs at the top or bottom of the stairs. If you do have throw rugs, attach them to the floor with carpet tape. Make sure that you have a light switch at the  top of the stairs and the bottom of the stairs. If you do not have them, ask someone to add them for you. What else can I do to help prevent falls? Wear shoes that: Do not have high heels. Have rubber bottoms. Are comfortable and fit you well. Are closed at the toe. Do not wear sandals. If you use a stepladder: Make sure that it is fully opened. Do not climb a closed stepladder. Make sure that both sides of the stepladder are locked into place. Ask someone to hold it for you, if possible. Clearly mark and make sure that you can see: Any grab bars or handrails. First and last steps. Where the edge of each step is. Use tools that help you move around (mobility aids) if they are needed. These  include: Canes. Walkers. Scooters. Crutches. Turn on the lights when you go into a dark area. Replace any light bulbs as soon as they burn out. Set up your furniture so you have a clear path. Avoid moving your furniture around. If any of your floors are uneven, fix them. If there are any pets around you, be aware of where they are. Review your medicines with your doctor. Some medicines can make you feel dizzy. This can increase your chance of falling. Ask your doctor what other things that you can do to help prevent falls. This information is not intended to replace advice given to you by your health care provider. Make sure you discuss any questions you have with your health care provider. Document Released: 08/18/2009 Document Revised: 03/29/2016 Document Reviewed: 11/26/2014 Elsevier Interactive Patient Education  2017 [employer].

## 2023-03-13 NOTE — Progress Notes (Signed)
Subjective:   Melissa Garza is a 67 y.o. female who presents for Medicare Annual (Subsequent) preventive examination.  I connected with  Melissa Garza on 03/13/23 by a telephone enabled telemedicine application and verified that I am speaking with the correct person using two identifiers.   I discussed the limitations of evaluation and management by telemedicine. The patient expressed understanding and agreed to proceed.  Patient location: home  Provider location: telephone/home   Review of Systems     Cardiac Risk Factors include: advanced age (>1men, >45 women);hypertension;sedentary lifestyle     Objective:    There were no vitals filed for this visit. There is no height or weight on file to calculate BMI.     03/13/2023   11:31 AM 05/15/2022    1:44 PM 01/11/2017    8:59 AM 12/28/2016    3:40 PM 11/07/2016    4:54 PM 05/26/2015    3:28 PM 04/05/2015   11:02 AM  Advanced Directives  Does Patient Have a Medical Advance Directive? Yes No Yes Yes No  Yes  Type of Advance Directive Healthcare Power of Attorney  Living will Living will  Healthcare Power of [place];Living will Healthcare Power of [place];Living will  Does patient want to make changes to medical advance directive? No - Patient declined        Copy of Healthcare Power of Attorney in Chart? No - copy requested      Yes  Would patient like information on creating a medical advance directive?  No - Patient declined    Yes - Educational materials given     Current Medications (verified) Outpatient Encounter Medications as of 03/13/2023  Medication Sig   ALPRAZolam (XANAX) 1 MG tablet Take 1 tablet (1 mg total) by mouth 3 (three) times daily as needed for anxiety.   aspirin 81 MG tablet Take 81 mg by mouth daily.   brimonidine-timolol (COMBIGAN) 0.2-0.5 % ophthalmic solution Place 1 drop into the right eye 2 (two) times daily.   buPROPion (WELLBUTRIN XL) 300 MG 24 hr tablet TAKE 1 TABLET DAILY   Calcium  Carb-Cholecalciferol (CALCIUM 500 + D PO) Take 2 tablets by mouth in the morning and at bedtime. GUMMY   Cholecalciferol (VITAMIN D3 MAXIMUM STRENGTH) 125 MCG (5000 UT) capsule Take 1 capsule by mouth daily at 6 (six) AM.   citalopram (CELEXA) 40 MG tablet Take 1.5 tablets (60 mg total) by mouth daily.   Cyanocobalamin (VITAMIN B12 PO) Take 1 tablet by mouth daily at 6 (six) AM.   denosumab (PROLIA) 60 MG/ML SOSY injection every 6 (six) months.   famotidine (PEPCID) 40 MG tablet TAKE 1 TABLET BY MOUTH DAILY   metoprolol succinate (TOPROL-XL) 25 MG 24 hr tablet Take 1 tablet (25 mg total) by mouth daily.   Risankizumab-rzaa,150 MG Dose, (SKYRIZI, 150 MG DOSE,) 75 MG/0.83ML PSKT Inject into the skin.   rosuvastatin (CRESTOR) 10 MG tablet Take 1 tablet (10 mg total) by mouth daily.   topiramate (TOPAMAX) 100 MG tablet Take 1 tablet (100 mg total) by mouth at bedtime.   travoprost, benzalkonium, (TRAVATAN) 0.004 % ophthalmic solution Place 1 drop into both eyes at bedtime.    traZODone (DESYREL) 50 MG tablet Take 1 tablet (50 mg total) by mouth at bedtime.   No facility-administered encounter medications on file as of 03/13/2023.    Allergies (verified) Fosamax [alendronate sodium], Hydrocodone, and Alendronate   History: Past Medical History:  Diagnosis Date   Anxiety    on  meds   Breast cancer (HCC)    RIGHT   Cancer (HCC)    cancer of right breast   Cataracts, bilateral 2018   on meds   Depression    on meds   Environmental allergies 2007   GERD (gastroesophageal reflux disease)    hx of-uses OTC PRN meds   Glaucoma    on meds   Headache    chronic   Hepatitis    Hx: of Hepatitis not sure which one   Hot flashes    Hyperlipidemia    on meds   Hypertension    on meds   Osteoporosis    on meds-every 6 months   Personal history of chemotherapy    Personal history of radiation therapy    PONV (postoperative nausea and vomiting)    S/P radiation therapy 03/01/2015 through  04/08/2015                                    03/01/2015 through 04/08/2015                                                                     Right breast 5040 cGy in 28 sessions (no boost)     Seasonal allergies    Shortness of breath    with exertion   Past Surgical History:  Procedure Laterality Date   BREAST ENHANCEMENT SURGERY  2008   BREAST LUMPECTOMY Right 2016   BREAST SURGERY     Biopsy   COLONOSCOPY  2018   KN-MAC-suprep/mira(exc)-polyp   DILATION AND CURETTAGE OF UTERUS     EYE SURGERY     Lasik   PORT-A-CATH REMOVAL N/A 02/01/2015   Procedure: REMOVAL PORT-A-CATH;  Surgeon: Glenna Fellows, MD;  Location: [facility];  Service: General;  Laterality: N/A;   PORTACATH PLACEMENT N/A 08/13/2014   Procedure: INSERTION PORT-A-CATH;  Surgeon: Glenna Fellows, MD;  Location: [facility] OR;  Service: General;  Laterality: N/A;   TONSILECTOMY/ADENOIDECTOMY WITH MYRINGOTOMY     TUBAL LIGATION     WISDOM TOOTH EXTRACTION     Family History  Problem Relation Age of Onset   Psoriasis Mother    Pulmonary embolism Mother    Hypertension Father    Glaucoma Father    Heart attack Father    Colon polyps Sister    Healthy Sister    Sleep apnea Sister    Healthy Sister    Thyroid disease Maternal Grandmother    Asthma Paternal Grandmother    Esophageal cancer Neg Hx    Rectal cancer Neg Hx    Stomach cancer Neg Hx    Colon cancer Neg Hx    Social History   Socioeconomic History   Marital status: Married    Spouse name: Jillyn Hidden   Number of children: 2   Years of education: Not on file   Highest education level: High school graduate  Occupational History   Occupation: Location manager    Comment: Engineer, manufacturing  Tobacco Use   Smoking status: Never   Smokeless tobacco: Never  Vaping Use   Vaping Use: Never used  Substance and Sexual Activity   Alcohol use: No   Drug  use: No   Sexual activity: Not Currently  Other Topics Concern   Not on file  Social History  Narrative   Lives at home with her husband   1 cup of coffee daily.     Occasional tea.   Social Determinants of Health   Financial Resource Strain: Low Risk  (03/13/2023)   Overall Financial Resource Strain (CARDIA)    Difficulty of Paying Living Expenses: Not very hard  Food Insecurity: No Food Insecurity (03/13/2023)   Hunger Vital Sign    Worried About Running Out of Food in the Last Year: Never true    Ran Out of Food in the Last Year: Never true  Transportation Needs: No Transportation Needs (03/13/2023)   PRAPARE - Administrator, Civil Service (Medical): No    Lack of Transportation (Non-Medical): No  Physical Activity: Inactive (03/13/2023)   Exercise Vital Sign    Days of Exercise per Week: 0 days    Minutes of Exercise per Session: 0 min  Stress: No Stress Concern Present (03/13/2023)   [employer] of Occupational Health - Occupational Stress Questionnaire    Feeling of Stress : Only a little  Social Connections: Moderately Isolated (03/13/2023)   Social Connection and Isolation Panel [NHANES]    Frequency of Communication with Friends and Family: Twice a week    Frequency of Social Gatherings with Friends and Family: Twice a week    Attends Religious Services: Never    Diplomatic Services operational officer: No    Attends Engineer, structural: Never    Marital Status: Married    Tobacco Counseling Counseling given: Not Answered   Clinical Intake:  Pre-visit preparation completed: Yes  Pain : No/denies pain     Diabetes: No  How often do you need to have someone help you when you read instructions, pamphlets, or other written materials from your doctor or pharmacy?: 1 - Never  Diabetic?  no  Interpreter Needed?: No  Information entered by :: Remi Haggard LPN   Activities of Daily Living    03/13/2023   11:16 AM 05/15/2022    1:44 PM  In your present state of health, do you have any difficulty performing the following activities:   Hearing? 0 0  Vision? 0 0  Difficulty concentrating or making decisions? 0 0  Walking or climbing stairs? 0 0  Dressing or bathing? 0 0  Doing errands, shopping? 0 0  Preparing Food and eating ? N N  Using the Toilet? N N  In the past six months, have you accidently leaked urine? N N  Do you have problems with loss of bowel control? N N  Managing your Medications? N N  Managing your Finances? N N  Housekeeping or managing your Housekeeping? N N    Patient Care Team: Sheliah Hatch, MD as PCP - General (Family Medicine) Glenna Fellows, MD (Inactive) as Consulting Physician (General Surgery) Magrinat, Valentino Hue, MD (Inactive) as Consulting Physician (Oncology) Chipper Herb, MD (Inactive) as Consulting Physician (Radiation Oncology) Essie Hart, MD (Inactive) as Referring Physician (Obstetrics and Gynecology) Napoleon Form, MD as Consulting Physician (Gastroenterology) Aletha Halim, MD as Referring Physician (Specialist) Milagros Evener, MD as Consulting Physician (Psychiatry)  Indicate any recent Medical Services you may have received from other than [facility] providers in the past year (date may be approximate).     Assessment:   This is a routine wellness examination for Melissa Garza.  Hearing/Vision screen Hearing Screening - Comments:: No  trouble hearing Except when she feels she has block ages Vision Screening - Comments:: Up to date Melissa Garza  Dietary issues and exercise activities discussed: Current Exercise Habits: The patient does not participate in regular exercise at present   Goals Addressed             This Visit's Progress    Patient Stated       Be more active       Depression Screen    03/13/2023   11:21 AM 09/03/2022   10:14 AM 07/02/2022   10:06 AM 05/21/2022   10:31 AM 05/15/2022    1:44 PM 05/15/2022    1:43 PM 04/03/2022   10:27 AM  PHQ 2/9 Scores  PHQ - 2 Score 2 2 0 4 0 0 4  PHQ- 9 Score 5 12 0 14   14    Fall Risk    03/13/2023    11:09 AM 09/03/2022   10:14 AM 07/02/2022   10:06 AM 05/21/2022   10:31 AM 05/15/2022    1:44 PM  Fall Risk   Falls in the past year? 1 0 0 0 0  Number falls in past yr: 1   0 0  Comment one ground unlevel  , rushing      Injury with Fall? 1   0 0  Comment brusing no hospital visit      Risk for fall due to : History of fall(s) No Fall Risks No Fall Risks No Fall Risks   Risk for fall due to: Comment feels unsteady because of knee      Follow up Falls evaluation completed;Education provided;Falls prevention discussed Falls evaluation completed Falls evaluation completed Falls evaluation completed Falls evaluation completed;Education provided    FALL RISK PREVENTION PERTAINING TO THE HOME:  Any stairs in or around the home? No  If so, are there any without handrails? No  Home free of loose throw rugs in walkways, pet beds, electrical cords, etc? Yes  Adequate lighting in your home to reduce risk of falls? Yes   ASSISTIVE DEVICES UTILIZED TO PREVENT FALLS:  Life alert? No  Use of a cane, walker or w/c? No  Grab bars in the bathroom? No  Shower chair or bench in shower? Yes  Elevated toilet seat or a handicapped toilet? Yes   TIMED UP AND GO:  Was the test performed? No .    Cognitive Function:    01/07/2019    8:38 AM  MMSE - Mini Mental State Exam  Orientation to time 5  Orientation to Place 5  Registration 3  Attention/ Calculation 5  Recall 2  Language- name 2 objects 2  Language- repeat 1  Language- follow 3 step command 3  Language- read & follow direction 1  Write a sentence 1  Copy design 1  Total score 29        03/13/2023   11:18 AM  6CIT Screen  What Year? 0 points  What month? 0 points  What time? 0 points  Count back from 20 0 points  Months in reverse 0 points  Repeat phrase 0 points  Total Score 0 points    Immunizations Immunization History  Administered Date(s) Administered   Fluad Quad(high Dose 65+) 08/31/2021   Influenza,inj,Quad PF,6+  Mos 08/08/2019, 07/02/2022   Influenza-Unspecified 09/16/2018   PFIZER(Purple Top)SARS-COV-2 Vaccination 01/17/2020, 02/08/2020   Pneumococcal Conjugate-13 03/01/2021   Tdap 12/26/2011    TDAP status: Due, Education has been provided regarding the importance of this  vaccine. Advised may receive this vaccine at local pharmacy or Health Dept. Aware to provide a copy of the vaccination record if obtained from local pharmacy or Health Dept. Verbalized acceptance and understanding.  Flu Vaccine status: Up to date  Pneumococcal vaccine status: Declined,  Education has been provided regarding the importance of this vaccine but patient still declined. Advised may receive this vaccine at local pharmacy or Health Dept. Aware to provide a copy of the vaccination record if obtained from local pharmacy or Health Dept. Verbalized acceptance and understanding.   Covid-19 vaccine status: Information provided on how to obtain vaccines.   Qualifies for Shingles Vaccine? Yes   Zostavax completed No   Shingrix Completed?: No.    Education has been provided regarding the importance of this vaccine. Patient has been advised to call insurance company to determine out of pocket expense if they have not yet received this vaccine. Advised may also receive vaccine at local pharmacy or Health Dept. Verbalized acceptance and understanding.  Screening Tests Health Maintenance  Topic Date Due   DTaP/Tdap/Td (2 - Td or Tdap) 12/25/2021   Pneumonia Vaccine 59+ Years old (2 of 2 - PPSV23 or PCV20) 04/04/2023 (Originally 03/01/2022)   Zoster Vaccines- Shingrix (1 of 2) 06/13/2023 (Originally 10/15/1975)   INFLUENZA VACCINE  06/06/2023   MAMMOGRAM  07/11/2023   Medicare Annual Wellness (AWV)  03/12/2024   COLONOSCOPY (Pts 45-31yrs Insurance coverage will need to be confirmed)  04/16/2029   DEXA SCAN  Completed   Hepatitis C Screening  Completed   HPV VACCINES  Aged Out   COVID-19 Vaccine  Discontinued    Health  Maintenance  Health Maintenance Due  Topic Date Due   DTaP/Tdap/Td (2 - Td or Tdap) 12/25/2021    Colorectal cancer screening: Type of screening: Colonoscopy. Completed 2023. Repeat every 7 years  Mammogram status: Completed 2023. Repeat every year  Bone Density status: Ordered  . Pt provided with contact info and advised to call to schedule appt.  Lung Cancer Screening: (Low Dose CT Chest recommended if Age 88-80 years, 30 pack-year currently smoking OR have quit w/in 15years.) does not qualify.   Lung Cancer Screening Referral:   Additional Screening:  Hepatitis C Screening: does not qualify; Completed 2019  Vision Screening: Recommended annual ophthalmology exams for early detection of glaucoma and other disorders of the eye. Is the patient up to date with their annual eye exam?  Yes  Who is the provider or what is the name of the office in which the patient attends annual eye exams? Melissa Garza If pt is not established with a provider, would they like to be referred to a provider to establish care? No .   Dental Screening: Recommended annual dental exams for proper oral hygiene  Community Resource Referral / Chronic Care Management: CRR required this visit?  No   CCM required this visit?  No      Plan:     I have personally reviewed and noted the following in the patient's chart:   Medical and social history Use of alcohol, tobacco or illicit drugs  Current medications and supplements including opioid prescriptions. Patient is not currently taking opioid prescriptions. Functional ability and status Nutritional status Physical activity Advanced directives List of other physicians Hospitalizations, surgeries, and ER visits in previous 12 months Vitals Screenings to include cognitive, depression, and falls Referrals and appointments  In addition, I have reviewed and discussed with patient certain preventive protocols, quality metrics, and best practice recommendations.  A  written personalized care plan for preventive services as well as general preventive health recommendations were provided to patient.     Remi Haggard, LPN   4/0/9811   Nurse Notes:   patient states she has had multiple falls,  states they were all different reason, was hurrying and tripped, her knee gave out, she stated no dizziness .    She does have trouble getting up when she falls had to call her son to come help her up.   She does not use a cane or walker.    Did discuss fall precautions .

## 2023-03-20 ENCOUNTER — Ambulatory Visit: Payer: Medicare HMO | Admitting: Family Medicine

## 2023-03-29 ENCOUNTER — Encounter: Payer: Medicare HMO | Admitting: Family Medicine

## 2023-04-05 ENCOUNTER — Telehealth: Payer: Self-pay

## 2023-04-05 ENCOUNTER — Encounter: Payer: Self-pay | Admitting: Family Medicine

## 2023-04-05 ENCOUNTER — Ambulatory Visit (INDEPENDENT_AMBULATORY_CARE_PROVIDER_SITE_OTHER): Payer: Medicare HMO | Admitting: Family Medicine

## 2023-04-05 VITALS — BP 126/80 | HR 71 | Temp 97.4°F | Resp 18 | Ht 62.0 in | Wt 170.0 lb

## 2023-04-05 DIAGNOSIS — E559 Vitamin D deficiency, unspecified: Secondary | ICD-10-CM

## 2023-04-05 DIAGNOSIS — Z23 Encounter for immunization: Secondary | ICD-10-CM | POA: Diagnosis not present

## 2023-04-05 DIAGNOSIS — E538 Deficiency of other specified B group vitamins: Secondary | ICD-10-CM | POA: Diagnosis not present

## 2023-04-05 DIAGNOSIS — I10 Essential (primary) hypertension: Secondary | ICD-10-CM

## 2023-04-05 DIAGNOSIS — Z0000 Encounter for general adult medical examination without abnormal findings: Secondary | ICD-10-CM | POA: Diagnosis not present

## 2023-04-05 LAB — CBC WITH DIFFERENTIAL/PLATELET
Basophils Absolute: 0 10*3/uL (ref 0.0–0.1)
Basophils Relative: 0.7 % (ref 0.0–3.0)
Eosinophils Absolute: 0.2 10*3/uL (ref 0.0–0.7)
Eosinophils Relative: 3 % (ref 0.0–5.0)
HCT: 41.5 % (ref 36.0–46.0)
Hemoglobin: 13.5 g/dL (ref 12.0–15.0)
Lymphocytes Relative: 27.2 % (ref 12.0–46.0)
Lymphs Abs: 1.7 10*3/uL (ref 0.7–4.0)
MCHC: 32.7 g/dL (ref 30.0–36.0)
MCV: 97.7 fl (ref 78.0–100.0)
Monocytes Absolute: 0.4 10*3/uL (ref 0.1–1.0)
Monocytes Relative: 5.7 % (ref 3.0–12.0)
Neutro Abs: 4 10*3/uL (ref 1.4–7.7)
Neutrophils Relative %: 63.4 % (ref 43.0–77.0)
Platelets: 183 10*3/uL (ref 150.0–400.0)
RBC: 4.24 Mil/uL (ref 3.87–5.11)
RDW: 13.5 % (ref 11.5–15.5)
WBC: 6.3 10*3/uL (ref 4.0–10.5)

## 2023-04-05 LAB — TSH: TSH: 2.6 u[IU]/mL (ref 0.35–5.50)

## 2023-04-05 LAB — LIPID PANEL
Cholesterol: 135 mg/dL (ref 0–200)
HDL: 54.6 mg/dL (ref >39.00)
LDL Cholesterol: 64 mg/dL (ref 0–99)
NonHDL: 80.21
Total CHOL/HDL Ratio: 2
Triglycerides: 81 mg/dL (ref 0.0–149.0)
VLDL: 16.2 mg/dL (ref 0.0–40.0)

## 2023-04-05 LAB — BASIC METABOLIC PANEL
BUN: 22 mg/dL (ref 6–23)
CO2: 26 mEq/L (ref 19–32)
Calcium: 9 mg/dL (ref 8.4–10.5)
Chloride: 109 mEq/L (ref 96–112)
Creatinine, Ser: 1.3 mg/dL — ABNORMAL HIGH (ref 0.40–1.20)
GFR: 42.56 mL/min — ABNORMAL LOW (ref >60.00)
Glucose, Bld: 87 mg/dL (ref 70–99)
Potassium: 4.1 mEq/L (ref 3.5–5.1)
Sodium: 140 mEq/L (ref 135–145)

## 2023-04-05 LAB — HEPATIC FUNCTION PANEL
ALT: 10 U/L (ref 0–35)
AST: 11 U/L (ref 0–37)
Albumin: 4.1 g/dL (ref 3.5–5.2)
Alkaline Phosphatase: 63 U/L (ref 39–117)
Bilirubin, Direct: 0 mg/dL (ref 0.0–0.3)
Total Bilirubin: 0.3 mg/dL (ref 0.2–1.2)
Total Protein: 6.9 g/dL (ref 6.0–8.3)

## 2023-04-05 LAB — VITAMIN D 25 HYDROXY (VIT D DEFICIENCY, FRACTURES): VITD: 112.91 ng/mL (ref 30.00–100.00)

## 2023-04-05 LAB — VITAMIN B12: Vitamin B-12: >1500 pg/mL — ABNORMAL HIGH (ref 211–911)

## 2023-04-05 NOTE — Assessment & Plan Note (Signed)
Pt's PE WNL w/ exception of BMI.  UTD on mammogram, colonoscopy, Tdap.  Prevnar 20 given.  Check labs.  Anticipatory guidance provided.

## 2023-04-05 NOTE — Progress Notes (Signed)
   Subjective:    Patient ID: Melissa Garza, female    DOB: Dec 15, 1955, 67 y.o.   MRN: [id number]  HPI CPE- UTD on mammogram, colonoscopy, Tdap.  Will get Prevnar today  Patient Care Team    Relationship Specialty Notifications Start End  Sheliah Hatch, MD PCP - General Family Medicine  08/04/18   Glenna Fellows, MD (Inactive) Consulting Physician General Surgery  08/06/14   Magrinat, Valentino Hue, MD (Inactive) Consulting Physician Oncology  08/06/14   Chipper Herb, MD (Inactive) Consulting Physician Radiation Oncology  08/06/14   Essie Hart, MD (Inactive) Referring Physician Obstetrics and Gynecology  08/04/18   Napoleon Form, MD Consulting Physician Gastroenterology  08/04/18   Aletha Halim, MD Referring Physician Specialist  08/04/18   Milagros Evener, MD Consulting Physician Psychiatry  01/07/19     Health Maintenance  Topic Date Due   Pneumonia Vaccine 78+ Years old (2 of 2 - PPSV23 or PCV20) 03/01/2022   Zoster Vaccines- Shingrix (1 of 2) 06/13/2023 (Originally 10/15/1975)   INFLUENZA VACCINE  06/06/2023   MAMMOGRAM  07/11/2023   Medicare Annual Wellness (AWV)  03/12/2024   Colonoscopy  04/16/2029   DEXA SCAN  Completed   Hepatitis C Screening  Completed   HPV VACCINES  Aged Out   DTaP/Tdap/Td  Discontinued   COVID-19 Vaccine  Discontinued      Review of Systems Patient reports no vision/ hearing changes, adenopathy,fever, weight change,  persistant/recurrent hoarseness , swallowing issues, chest pain, palpitations, edema, persistant/recurrent cough, hemoptysis, dyspnea (rest/exertional/paroxysmal nocturnal), gastrointestinal bleeding (melena, rectal bleeding), abdominal pain, significant heartburn, bowel changes, GU symptoms (dysuria, hematuria, incontinence), Gyn symptoms (abnormal  bleeding, pain),  syncope, focal weakness, memory loss, numbness & tingling, skin/hair/nail changes, abnormal bruising or bleeding, anxiety, or depression.     Objective:    Physical Exam General Appearance:    Alert, cooperative, no distress, appears stated age  Head:    Normocephalic, without obvious abnormality, atraumatic  Eyes:    PERRL, conjunctiva/corneas clear, EOM's intact both eyes  Ears:    Normal TM's and external ear canals, both ears  Nose:   Nares normal, septum midline, mucosa normal, no drainage    or sinus tenderness  Throat:   Lips, mucosa, and tongue normal; teeth and gums normal  Neck:   Supple, symmetrical, trachea midline, no adenopathy;    Thyroid: no enlargement/tenderness/nodules  Back:     Symmetric, no curvature, ROM normal, no CVA tenderness  Lungs:     Clear to auscultation bilaterally, respirations unlabored  Chest Wall:    No tenderness or deformity   Heart:    Regular rate and rhythm, S1 and S2 normal, no murmur, rub   or gallop  Breast Exam:    Deferred to mammo  Abdomen:     Soft, non-tender, bowel sounds active all four quadrants,    no masses, no organomegaly  Genitalia:    Deferred  Rectal:    Extremities:   Extremities normal, atraumatic, no cyanosis or edema  Pulses:   2+ and symmetric all extremities  Skin:   Skin color, texture, turgor normal, no rashes or lesions  Lymph nodes:   Cervical, supraclavicular, and axillary nodes normal  Neurologic:   CNII-XII intact, normal strength, sensation and reflexes    throughout          Assessment & Plan:

## 2023-04-05 NOTE — Patient Instructions (Signed)
Follow up in 6 months to recheck BP and cholesterol (sooner if needed) We'll notify you of your lab results and make any changes if needed Keep up the good work on healthy diet and try and add regular walking Call with any questions or concerns Stay Safe!  Stay Healthy! Hang in there!!!

## 2023-04-05 NOTE — Telephone Encounter (Signed)
Hope for [facility] lab called with a critical high Vitamin D for this patient of 112.91. Repeated back to [facility] and confirmed. Sent Dr. Beverely Low the phone note and reached out to her via text as she is out of the office already.

## 2023-04-05 NOTE — Assessment & Plan Note (Signed)
Check labs and replete prn. 

## 2023-04-08 ENCOUNTER — Telehealth: Payer: Self-pay

## 2023-04-08 ENCOUNTER — Other Ambulatory Visit: Payer: Self-pay

## 2023-04-08 DIAGNOSIS — R7989 Other specified abnormal findings of blood chemistry: Secondary | ICD-10-CM

## 2023-04-08 DIAGNOSIS — R748 Abnormal levels of other serum enzymes: Secondary | ICD-10-CM

## 2023-04-08 NOTE — Telephone Encounter (Signed)
Pt aware of lab results and lab only apt has been made . Orders for Vit D and BMP are in

## 2023-04-08 NOTE — Telephone Encounter (Signed)
-----   Message from Sheliah Hatch, MD sent at 04/08/2023  7:45 AM EDT ----- Labs look good w/ a few exceptions-  1) Vit D is too high.  Please hold any Vit D containing vitamins or supplements and we'll repeat your Vit D level at a lab visit in 1-2 weeks (dx elevated Vit D level)  2) Your Creatinine (a marker of kidney function) has increased to 1.3  This subsequently causes your GFR to go down (they are inversely related).  Please increase your water intake and we'll repeat your BMP at the same lab only visit (dx elevated serum creatinine)

## 2023-04-08 NOTE — Telephone Encounter (Signed)
Addressed via result note 

## 2023-04-08 NOTE — Telephone Encounter (Signed)
Pt is aware of lab results as well

## 2023-04-15 DIAGNOSIS — Z853 Personal history of malignant neoplasm of breast: Secondary | ICD-10-CM | POA: Diagnosis not present

## 2023-04-15 DIAGNOSIS — M810 Age-related osteoporosis without current pathological fracture: Secondary | ICD-10-CM | POA: Diagnosis not present

## 2023-04-16 ENCOUNTER — Encounter: Payer: Self-pay | Admitting: Nurse Practitioner

## 2023-04-16 ENCOUNTER — Other Ambulatory Visit (INDEPENDENT_AMBULATORY_CARE_PROVIDER_SITE_OTHER): Payer: Medicare HMO

## 2023-04-16 ENCOUNTER — Telehealth: Payer: Self-pay

## 2023-04-16 DIAGNOSIS — R748 Abnormal levels of other serum enzymes: Secondary | ICD-10-CM

## 2023-04-16 DIAGNOSIS — R7989 Other specified abnormal findings of blood chemistry: Secondary | ICD-10-CM | POA: Diagnosis not present

## 2023-04-16 LAB — BASIC METABOLIC PANEL
BUN: 19 mg/dL (ref 6–23)
CO2: 24 mEq/L (ref 19–32)
Calcium: 9.2 mg/dL (ref 8.4–10.5)
Chloride: 109 mEq/L (ref 96–112)
Creatinine, Ser: 1.19 mg/dL (ref 0.40–1.20)
GFR: 47.32 mL/min — ABNORMAL LOW (ref >60.00)
Glucose, Bld: 88 mg/dL (ref 70–99)
Potassium: 4.2 mEq/L (ref 3.5–5.1)
Sodium: 138 mEq/L (ref 135–145)

## 2023-04-16 LAB — VITAMIN D 25 HYDROXY (VIT D DEFICIENCY, FRACTURES): VITD: 113.71 ng/mL (ref 30.00–100.00)

## 2023-04-16 NOTE — Telephone Encounter (Signed)
Pt is aware . Pt does not take a Vit D . Pt reports she does not go outside much.

## 2023-04-16 NOTE — Telephone Encounter (Signed)
Critical lab call with value Vitamin D 113.71   Hope from [facility] lab called

## 2023-04-17 ENCOUNTER — Other Ambulatory Visit: Payer: Self-pay

## 2023-04-17 ENCOUNTER — Telehealth: Payer: Self-pay | Admitting: Family Medicine

## 2023-04-17 ENCOUNTER — Telehealth: Payer: Self-pay

## 2023-04-17 DIAGNOSIS — E559 Vitamin D deficiency, unspecified: Secondary | ICD-10-CM

## 2023-04-17 NOTE — Telephone Encounter (Signed)
-----   Message from Sheliah Hatch, MD sent at 04/17/2023  7:40 AM EDT ----- Creatinine has improved which means GFR is better.  This is good news!  Continue to drink plenty of water.  Your Vit D level is unchanged.  Please make sure you are not taking an additional calcium or Vit D supplements (either separately or a multivitamin).  We will repeat your Vit D level in 2 weeks at a lab only visit (dx elevated Vit D)

## 2023-04-17 NOTE — Telephone Encounter (Signed)
Pt aware of lab results . Repeat order for the Vit D has been placed and lab visit has been made

## 2023-04-17 NOTE — Telephone Encounter (Signed)
Left pt a detailed VM to stop all supplements until she has her repeat lab .

## 2023-04-17 NOTE — Telephone Encounter (Signed)
We have called the pt as well .

## 2023-04-17 NOTE — Telephone Encounter (Signed)
Caller name: SENAIDA CHILCOTE  On DPR?: Yes  Call back number: [phone] (home)  Provider they see: Sheliah Hatch, MD  Reason for call:   Would like to [facility] about her lab results

## 2023-04-25 ENCOUNTER — Other Ambulatory Visit: Payer: Self-pay

## 2023-04-25 DIAGNOSIS — G43709 Chronic migraine without aura, not intractable, without status migrainosus: Secondary | ICD-10-CM

## 2023-04-25 MED ORDER — TOPIRAMATE 100 MG PO TABS
100.0000 mg | ORAL_TABLET | Freq: Every day | ORAL | 1 refills | Status: DC
Start: 2023-04-25 — End: 2023-10-14

## 2023-04-26 ENCOUNTER — Other Ambulatory Visit: Payer: Self-pay

## 2023-04-26 ENCOUNTER — Telehealth: Payer: Self-pay

## 2023-04-26 DIAGNOSIS — F419 Anxiety disorder, unspecified: Secondary | ICD-10-CM

## 2023-04-26 MED ORDER — CITALOPRAM HYDROBROMIDE 40 MG PO TABS: 60.0000 mg | ORAL_TABLET | Freq: Every day | ORAL | 1 refills | Status: DC

## 2023-04-26 MED ORDER — ALPRAZOLAM 1 MG PO TABS: 1.0000 mg | ORAL_TABLET | Freq: Three times a day (TID) | ORAL | 1 refills | Status: DC | PRN

## 2023-04-26 NOTE — Telephone Encounter (Signed)
Citalopram 40 mg LOV: 04/05/23 Last Refill:12/07/22 Upcoming appt: 04/30/23  Sent a message in regards to pt Xanax as well not sure if it sent

## 2023-04-26 NOTE — Telephone Encounter (Signed)
Prescription sent as requested.

## 2023-04-26 NOTE — Telephone Encounter (Signed)
Alprazolam 1 mg LOV: 04/05/23 Last Refill:10/02/22 Upcoming appt: 04/30/23

## 2023-04-26 NOTE — Addendum Note (Signed)
Addended by: Sheliah Hatch on: 04/26/2023 03:22 PM   Modules accepted: Orders

## 2023-04-27 ENCOUNTER — Other Ambulatory Visit: Payer: Self-pay | Admitting: Family Medicine

## 2023-04-27 DIAGNOSIS — F32A Depression, unspecified: Secondary | ICD-10-CM

## 2023-04-30 ENCOUNTER — Other Ambulatory Visit (INDEPENDENT_AMBULATORY_CARE_PROVIDER_SITE_OTHER): Payer: Medicare HMO

## 2023-04-30 DIAGNOSIS — E559 Vitamin D deficiency, unspecified: Secondary | ICD-10-CM

## 2023-04-30 LAB — VITAMIN D 25 HYDROXY (VIT D DEFICIENCY, FRACTURES): VITD: 83.52 ng/mL (ref 30.00–100.00)

## 2023-05-01 ENCOUNTER — Telehealth: Payer: Self-pay

## 2023-05-01 NOTE — Telephone Encounter (Signed)
-----   Message from Sheliah Hatch, MD sent at 05/01/2023  7:34 AM EDT ----- Normal Vit D level- great news!  No changes at this time

## 2023-05-01 NOTE — Telephone Encounter (Signed)
Pt seen results Via my chart  

## 2023-05-02 ENCOUNTER — Telehealth: Payer: Self-pay | Admitting: Family Medicine

## 2023-05-02 NOTE — Telephone Encounter (Signed)
Please advise if this is a concern or if this has been reviewed and is acceptable

## 2023-05-02 NOTE — Telephone Encounter (Signed)
[facility] is wanting Dr Beverely Low know FDA says if Celexa is over 40mg  it will cause abnormal heart rhythm. The dosage says 60mg . Please advise [facility] [phone] opt 2 reference #[id number].

## 2023-05-02 NOTE — Telephone Encounter (Signed)
We are aware of the risk, but she has been taking this since at least 2022.  I don't want to make any changes without discussing w/ her first

## 2023-05-02 NOTE — Telephone Encounter (Signed)
Left vm for pt to return my call.  

## 2023-05-03 ENCOUNTER — Telehealth: Payer: Self-pay | Admitting: Family Medicine

## 2023-05-03 DIAGNOSIS — F419 Anxiety disorder, unspecified: Secondary | ICD-10-CM

## 2023-05-03 MED ORDER — CITALOPRAM HYDROBROMIDE 40 MG PO TABS
60.0000 mg | ORAL_TABLET | Freq: Every day | ORAL | 1 refills | Status: DC
Start: 2023-05-03 — End: 2023-09-11

## 2023-05-03 NOTE — Telephone Encounter (Signed)
Pt would like Diamond to call her about medication.   Encourage patient to contact the pharmacy for refills or they can request refills through [facility]   WHAT PHARMACY WOULD THEY LIKE THIS SENT TO:  [facility] [address] [place], [place] - [address] [place]. AT [facility] [address]   MEDICATION NAME & DOSE: citalopram (CELEXA) 40 MG tablet   NOTES/COMMENTS FROM PATIENT:   Front office please notify patient: It takes 48-72 hours to process rx refill requests Ask patient to call pharmacy to ensure rx is ready before heading there.

## 2023-05-03 NOTE — Telephone Encounter (Signed)
Sent to [facility] on [address] per request, called patient and informed her

## 2023-05-03 NOTE — Telephone Encounter (Signed)
Spoke w/ pt to advise her of what [facility] stated about the FDA about the Celexa 40 mg . She states she is doing well on it  and having no issues with heart rate .

## 2023-05-15 ENCOUNTER — Other Ambulatory Visit: Payer: Self-pay | Admitting: Family Medicine

## 2023-05-15 DIAGNOSIS — F32A Depression, unspecified: Secondary | ICD-10-CM

## 2023-05-22 DIAGNOSIS — H04123 Dry eye syndrome of bilateral lacrimal glands: Secondary | ICD-10-CM | POA: Diagnosis not present

## 2023-05-22 DIAGNOSIS — H401233 Low-tension glaucoma, bilateral, severe stage: Secondary | ICD-10-CM | POA: Diagnosis not present

## 2023-06-03 ENCOUNTER — Other Ambulatory Visit: Payer: Self-pay | Admitting: Family Medicine

## 2023-07-02 DIAGNOSIS — K219 Gastro-esophageal reflux disease without esophagitis: Secondary | ICD-10-CM | POA: Diagnosis not present

## 2023-07-02 DIAGNOSIS — I10 Essential (primary) hypertension: Secondary | ICD-10-CM | POA: Diagnosis not present

## 2023-07-02 DIAGNOSIS — E669 Obesity, unspecified: Secondary | ICD-10-CM | POA: Diagnosis not present

## 2023-07-02 DIAGNOSIS — F419 Anxiety disorder, unspecified: Secondary | ICD-10-CM | POA: Diagnosis not present

## 2023-07-02 DIAGNOSIS — G43909 Migraine, unspecified, not intractable, without status migrainosus: Secondary | ICD-10-CM | POA: Diagnosis not present

## 2023-07-02 DIAGNOSIS — E785 Hyperlipidemia, unspecified: Secondary | ICD-10-CM | POA: Diagnosis not present

## 2023-07-02 DIAGNOSIS — R32 Unspecified urinary incontinence: Secondary | ICD-10-CM | POA: Diagnosis not present

## 2023-07-02 DIAGNOSIS — H269 Unspecified cataract: Secondary | ICD-10-CM | POA: Diagnosis not present

## 2023-07-02 DIAGNOSIS — G4700 Insomnia, unspecified: Secondary | ICD-10-CM | POA: Diagnosis not present

## 2023-07-02 DIAGNOSIS — H409 Unspecified glaucoma: Secondary | ICD-10-CM | POA: Diagnosis not present

## 2023-07-02 DIAGNOSIS — L400 Psoriasis vulgaris: Secondary | ICD-10-CM | POA: Diagnosis not present

## 2023-07-02 DIAGNOSIS — F329 Major depressive disorder, single episode, unspecified: Secondary | ICD-10-CM | POA: Diagnosis not present

## 2023-07-11 DIAGNOSIS — Z1239 Encounter for other screening for malignant neoplasm of breast: Secondary | ICD-10-CM | POA: Diagnosis not present

## 2023-07-11 DIAGNOSIS — Z1231 Encounter for screening mammogram for malignant neoplasm of breast: Secondary | ICD-10-CM | POA: Diagnosis not present

## 2023-07-11 DIAGNOSIS — Z01419 Encounter for gynecological examination (general) (routine) without abnormal findings: Secondary | ICD-10-CM | POA: Diagnosis not present

## 2023-07-16 ENCOUNTER — Other Ambulatory Visit: Payer: Self-pay | Admitting: Pharmacist

## 2023-07-16 DIAGNOSIS — M810 Age-related osteoporosis without current pathological fracture: Secondary | ICD-10-CM | POA: Diagnosis not present

## 2023-07-16 DIAGNOSIS — M8589 Other specified disorders of bone density and structure, multiple sites: Secondary | ICD-10-CM | POA: Diagnosis not present

## 2023-08-04 ENCOUNTER — Other Ambulatory Visit: Payer: Self-pay | Admitting: Family Medicine

## 2023-08-07 DIAGNOSIS — Z79899 Other long term (current) drug therapy: Secondary | ICD-10-CM | POA: Diagnosis not present

## 2023-08-07 DIAGNOSIS — L400 Psoriasis vulgaris: Secondary | ICD-10-CM | POA: Diagnosis not present

## 2023-08-08 LAB — LAB REPORT - SCANNED: EGFR: 52

## 2023-08-15 ENCOUNTER — Other Ambulatory Visit: Payer: Self-pay | Admitting: Family Medicine

## 2023-08-15 DIAGNOSIS — F419 Anxiety disorder, unspecified: Secondary | ICD-10-CM

## 2023-08-15 NOTE — Telephone Encounter (Signed)
Patient is requesting a refill of the following medications: Requested Prescriptions   Pending Prescriptions Disp Refills   traZODone (DESYREL) 50 MG tablet [Pharmacy Med Name: TRAZODONE TAB 50MG ] 30 tablet 3    Sig: TAKE 1 TABLET AT BEDTIME    Date of patient request: 08/15/23 Last office visit: 04/05/23 Date of last refill: 05/15/23 Last refill amount: 30 Follow up time period per chart: 6 months

## 2023-08-27 ENCOUNTER — Telehealth: Payer: Self-pay | Admitting: Family Medicine

## 2023-08-27 NOTE — Telephone Encounter (Signed)
Chart note placed in front bin

## 2023-08-27 NOTE — Telephone Encounter (Signed)
Placed in folder at nurse station

## 2023-09-03 ENCOUNTER — Other Ambulatory Visit: Payer: Self-pay | Admitting: Family Medicine

## 2023-09-03 NOTE — Telephone Encounter (Signed)
Forms reviewed and sent to scan.  No action needed

## 2023-09-05 DIAGNOSIS — M810 Age-related osteoporosis without current pathological fracture: Secondary | ICD-10-CM | POA: Diagnosis not present

## 2023-09-06 ENCOUNTER — Other Ambulatory Visit: Payer: Self-pay | Admitting: Family Medicine

## 2023-09-06 DIAGNOSIS — I10 Essential (primary) hypertension: Secondary | ICD-10-CM

## 2023-09-11 ENCOUNTER — Other Ambulatory Visit: Payer: Self-pay | Admitting: Family Medicine

## 2023-09-11 DIAGNOSIS — F32A Depression, unspecified: Secondary | ICD-10-CM

## 2023-10-12 ENCOUNTER — Other Ambulatory Visit: Payer: Self-pay | Admitting: Family Medicine

## 2023-10-12 DIAGNOSIS — G43709 Chronic migraine without aura, not intractable, without status migrainosus: Secondary | ICD-10-CM

## 2023-11-23 ENCOUNTER — Other Ambulatory Visit: Payer: Self-pay | Admitting: Family Medicine

## 2023-11-23 DIAGNOSIS — G43709 Chronic migraine without aura, not intractable, without status migrainosus: Secondary | ICD-10-CM

## 2023-11-23 DIAGNOSIS — F419 Anxiety disorder, unspecified: Secondary | ICD-10-CM

## 2023-12-09 ENCOUNTER — Ambulatory Visit: Payer: Self-pay | Admitting: Family Medicine

## 2023-12-09 ENCOUNTER — Other Ambulatory Visit: Payer: Self-pay | Admitting: Family Medicine

## 2023-12-09 ENCOUNTER — Telehealth: Payer: Medicare HMO

## 2023-12-09 ENCOUNTER — Telehealth: Payer: Self-pay

## 2023-12-09 DIAGNOSIS — E785 Hyperlipidemia, unspecified: Secondary | ICD-10-CM

## 2023-12-09 MED ORDER — CEPHALEXIN 500 MG PO CAPS: 500.0000 mg | ORAL_CAPSULE | Freq: Two times a day (BID) | ORAL | 0 refills | Status: AC

## 2023-12-09 NOTE — Telephone Encounter (Signed)
 Patient has been notified

## 2023-12-09 NOTE — Telephone Encounter (Signed)
This patient was put on the telehealth provider schedule and was never seen. She called to our office to ask for advice regarding the visit and she was given the my chart number. I called her at about 2:15 to check on her and be sure she was seen and she still hadn't been. She stated she was waiting on the video for over an hour and wasn't seen. I submitted a CRM error and informed her that Dr. Beverely Low would request to see her with UTI sx so that we can test it and be sure she was being treated correctly. She stated that she understood but she could not leave her husband alone. She states he is on hospice and her children work and she hasn't left the house since the second week of December. I let her know that I would give this information to Dr. Beverely Low and would give her a call back. Her symptoms are frequency, urgency, "a tingly feeling," denies burning, itching, discharge, and fever. I asked if there was anyway she felt she could have someone at her house long enough for her to drop off a urine and she stated no, she lives in [place] and we are in [place] so that's too far.

## 2023-12-09 NOTE — Telephone Encounter (Signed)
Had visit with Telehealth provider today

## 2023-12-09 NOTE — Telephone Encounter (Signed)
I hate that this whole process of getting an appointment has been so stressful.  I sent in Keflex to [facility] for her to start taking in case of UTI.  If after taking the medication she continues to have symptoms, she will need an appointment at that time so we can figure out what's going on.  I wish her my best and I'm sending prayers

## 2023-12-09 NOTE — Telephone Encounter (Signed)
Copied from CRM [id number]. Topic: Clinical - Medical Advice >> Dec 09, 2023 12:14 PM Alona Bene A wrote: Reason for CRM: Patient has possible UTI and would like to speak with nurse regarding possible medication she can take. Please contact patient at [phone].  Chief Complaint: burning with urination Symptoms: pain/pressure while urinating, frequency Frequency: started last week Pertinent Negatives: Patient denies fever, pain Disposition: [] ED /[] Urgent Care (no appt availability in office) / [x] Appointment(In office/virtual)/ []  [facility]/ [] Home Care/ [] Refused Recommended Disposition /[] [facility]/ []  Follow-up with PCP Additional Notes: States symptoms started last week.  States has taken OTC medication for this.  Apt made virtually for today.  Pcp office updated.  Care advice given, denies question, instructed to go to er if becomes worse.   Reason for Disposition  Urinating more frequently than usual (i.e., frequency)  Answer Assessment - Initial Assessment Questions 1. SYMPTOM: "What's the main symptom you're concerned about?" (e.g., frequency, incontinence)     Hurts/pressure when I go, frequency 2. ONSET: "When did the  burning  start?"     Tuesday or Wednesday.  3. PAIN: "Is there any pain?" If Yes, ask: "How bad is it?" (Scale: 1-10; mild, moderate, severe)     Denies pain 4. CAUSE: "What do you think is causing the symptoms?"     UTI 5. OTHER SYMPTOMS: "Do you have any other symptoms?" (e.g., blood in urine, fever, flank pain, pain with urination)     Denies.  Protocols used: Urinary Symptoms-A-AH

## 2023-12-09 NOTE — Telephone Encounter (Signed)
It appears that pt is doing telehealth visit today

## 2023-12-09 NOTE — Telephone Encounter (Signed)
Copied from CRM [id number]. Topic: Clinical - Medical Advice >> Dec 09, 2023  1:51 PM [place] M wrote: Reason for CRM: Patient has a possible UTI and she is unable to come in to the office due to her husband being on hospice and not having anyone that is able to come and stay with him. She is wanting to know if a medication can be called in for her.

## 2023-12-09 NOTE — Addendum Note (Signed)
Addended by: Sheliah Hatch on: 12/09/2023 02:54 PM   Modules accepted: Orders

## 2023-12-23 ENCOUNTER — Other Ambulatory Visit: Payer: Self-pay | Admitting: Family Medicine

## 2023-12-23 DIAGNOSIS — F32A Depression, unspecified: Secondary | ICD-10-CM

## 2023-12-24 NOTE — Telephone Encounter (Signed)
Requested Prescriptions   Pending Prescriptions Disp Refills   traZODone (DESYREL) 50 MG tablet [Pharmacy Med Name: TRAZODONE 50 MG TAB[*]] 30 tablet 0    Sig: TAKE ONE TABLET BY MOUTH AT BEDTIME   Signed Prescriptions Disp Refills   famotidine (PEPCID) 40 MG tablet 30 tablet 3    Sig: TAKE ONE TABLET BY MOUTH ONE TIME DAILY    Authorizing Provider: Sheliah Hatch    Ordering User: Cordella Register     Date of patient request: 12/24/2023 Last office visit: 04/05/2023 Upcoming visit: Visit date not found Date of last refill: 11/25/2023 Last refill amount: 30

## 2024-01-21 ENCOUNTER — Other Ambulatory Visit: Payer: Self-pay | Admitting: Family Medicine

## 2024-01-21 DIAGNOSIS — F419 Anxiety disorder, unspecified: Secondary | ICD-10-CM

## 2024-01-22 ENCOUNTER — Other Ambulatory Visit: Payer: Self-pay | Admitting: Family Medicine

## 2024-01-22 DIAGNOSIS — F419 Anxiety disorder, unspecified: Secondary | ICD-10-CM

## 2024-01-28 ENCOUNTER — Other Ambulatory Visit: Payer: Self-pay | Admitting: Family Medicine

## 2024-01-31 DIAGNOSIS — H43811 Vitreous degeneration, right eye: Secondary | ICD-10-CM | POA: Diagnosis not present

## 2024-01-31 DIAGNOSIS — H04123 Dry eye syndrome of bilateral lacrimal glands: Secondary | ICD-10-CM | POA: Diagnosis not present

## 2024-01-31 DIAGNOSIS — H401233 Low-tension glaucoma, bilateral, severe stage: Secondary | ICD-10-CM | POA: Diagnosis not present

## 2024-01-31 DIAGNOSIS — H2513 Age-related nuclear cataract, bilateral: Secondary | ICD-10-CM | POA: Diagnosis not present

## 2024-02-05 DIAGNOSIS — Z79899 Other long term (current) drug therapy: Secondary | ICD-10-CM | POA: Diagnosis not present

## 2024-02-06 LAB — LAB REPORT - SCANNED: EGFR: 42

## 2024-02-10 ENCOUNTER — Encounter: Payer: Self-pay | Admitting: Dermatology

## 2024-02-21 ENCOUNTER — Other Ambulatory Visit: Payer: Self-pay | Admitting: Family Medicine

## 2024-02-21 DIAGNOSIS — G43709 Chronic migraine without aura, not intractable, without status migrainosus: Secondary | ICD-10-CM

## 2024-03-02 ENCOUNTER — Other Ambulatory Visit: Payer: Self-pay | Admitting: Family Medicine

## 2024-03-05 DIAGNOSIS — H2511 Age-related nuclear cataract, right eye: Secondary | ICD-10-CM | POA: Diagnosis not present

## 2024-03-05 DIAGNOSIS — H401113 Primary open-angle glaucoma, right eye, severe stage: Secondary | ICD-10-CM | POA: Diagnosis not present

## 2024-03-09 ENCOUNTER — Other Ambulatory Visit: Payer: Self-pay

## 2024-03-09 MED ORDER — ALPRAZOLAM 1 MG PO TABS: 1.0000 mg | ORAL_TABLET | Freq: Three times a day (TID) | ORAL | 1 refills | Status: AC | PRN

## 2024-03-09 NOTE — Telephone Encounter (Signed)
 Requested Prescriptions   Pending Prescriptions Disp Refills   ALPRAZolam  (XANAX ) 1 MG tablet 100 tablet 1    Sig: Take 1 tablet (1 mg total) by mouth 3 (three) times daily as needed for anxiety.     Date of patient request: 03/09/2024 Last office visit: 04/05/2023 Upcoming visit: 04/14/2024 Date of last refill: 04/26/2023 Last refill amount: 100x1

## 2024-03-09 NOTE — Telephone Encounter (Signed)
 Called patient to schedule Annual Wellness, patient asked that I send a message to clinical staff- she is requesting refill on her Alprazolam , wants refill sent to [facility].

## 2024-04-13 DIAGNOSIS — M810 Age-related osteoporosis without current pathological fracture: Secondary | ICD-10-CM | POA: Diagnosis not present

## 2024-04-13 DIAGNOSIS — K219 Gastro-esophageal reflux disease without esophagitis: Secondary | ICD-10-CM | POA: Diagnosis not present

## 2024-04-13 DIAGNOSIS — Z833 Family history of diabetes mellitus: Secondary | ICD-10-CM | POA: Diagnosis not present

## 2024-04-13 DIAGNOSIS — D84821 Immunodeficiency due to drugs: Secondary | ICD-10-CM | POA: Diagnosis not present

## 2024-04-13 DIAGNOSIS — E785 Hyperlipidemia, unspecified: Secondary | ICD-10-CM | POA: Diagnosis not present

## 2024-04-13 DIAGNOSIS — D84822 Immunodeficiency due to external causes: Secondary | ICD-10-CM | POA: Diagnosis not present

## 2024-04-13 DIAGNOSIS — E669 Obesity, unspecified: Secondary | ICD-10-CM | POA: Diagnosis not present

## 2024-04-13 DIAGNOSIS — F321 Major depressive disorder, single episode, moderate: Secondary | ICD-10-CM | POA: Diagnosis not present

## 2024-04-13 DIAGNOSIS — Z8249 Family history of ischemic heart disease and other diseases of the circulatory system: Secondary | ICD-10-CM | POA: Diagnosis not present

## 2024-04-13 DIAGNOSIS — I129 Hypertensive chronic kidney disease with stage 1 through stage 4 chronic kidney disease, or unspecified chronic kidney disease: Secondary | ICD-10-CM | POA: Diagnosis not present

## 2024-04-13 DIAGNOSIS — N1831 Chronic kidney disease, stage 3a: Secondary | ICD-10-CM | POA: Diagnosis not present

## 2024-04-13 DIAGNOSIS — F419 Anxiety disorder, unspecified: Secondary | ICD-10-CM | POA: Diagnosis not present

## 2024-04-14 ENCOUNTER — Ambulatory Visit (INDEPENDENT_AMBULATORY_CARE_PROVIDER_SITE_OTHER): Admitting: *Deleted

## 2024-04-14 DIAGNOSIS — Z0000 Encounter for general adult medical examination without abnormal findings: Secondary | ICD-10-CM | POA: Diagnosis not present

## 2024-04-14 NOTE — Progress Notes (Signed)
 Subjective:   Melissa Garza is a 68 y.o. female who presents for Medicare Annual (Subsequent) preventive examination.  Visit Complete: Virtual I connected with  AAMIRAH SALMI on 04/14/24 by a audio enabled telemedicine application and verified that I am speaking with the correct person using two identifiers.  Patient Location: Home  Provider Location: Home Office  I discussed the limitations of evaluation and management by telemedicine. The patient expressed understanding and agreed to proceed.  Vital Signs: Because this visit was a virtual/telehealth visit, some criteria may be missing or patient reported. Any vitals not documented were not able to be obtained and vitals that have been documented are patient reported.   Cardiac Risk Factors include: advanced age (>48men, >34 women);hypertension     Objective:     There were no vitals filed for this visit. There is no height or weight on file to calculate BMI.     04/14/2024    9:45 AM 03/13/2023   11:31 AM 05/15/2022    1:44 PM 01/11/2017    8:59 AM 12/28/2016    3:40 PM 11/07/2016    4:54 PM 05/26/2015    3:28 PM  Advanced Directives  Does Patient Have a Medical Advance Directive? No Yes No Yes Yes No   Type of Advance Directive  Healthcare Power of Attorney  Living will Living will  Healthcare Power of [place];Living will  Does patient want to make changes to medical advance directive?  No - Patient declined       Copy of Healthcare Power of Attorney in Chart?  No - copy requested       Would patient like information on creating a medical advance directive? No - Patient declined  No - Patient declined    Yes - Educational materials given    Current Medications (verified) Outpatient Encounter Medications as of 04/14/2024  Medication Sig   ALPRAZolam  (XANAX ) 1 MG tablet Take 1 tablet (1 mg total) by mouth 3 (three) times daily as needed for anxiety.   aspirin 81 MG tablet Take 81 mg by mouth daily.    brimonidine-timolol (COMBIGAN) 0.2-0.5 % ophthalmic solution Place 1 drop into the right eye 2 (two) times daily.   buPROPion  (WELLBUTRIN  XL) 300 MG 24 hr tablet TAKE 1 TABLET DAILY   citalopram  (CELEXA ) 40 MG tablet TAKE ONE AND ONE-HALF TABLETS BY MOUTH ONE TIME DAILY   famotidine  (PEPCID ) 40 MG tablet TAKE ONE TABLET BY MOUTH ONE TIME DAILY   metoprolol  succinate (TOPROL -XL) 25 MG 24 hr tablet TAKE 1 TABLET DAILY   Risankizumab-rzaa,150 MG Dose, (SKYRIZI, 150 MG DOSE,) 75 MG/0.83ML PSKT Inject into the skin.   rosuvastatin  (CRESTOR ) 10 MG tablet TAKE 1 TABLET DAILY   topiramate  (TOPAMAX ) 100 MG tablet TAKE ONE TABLET BY MOUTH EVERY NIGHT AT BEDTIME   travoprost, benzalkonium, (TRAVATAN) 0.004 % ophthalmic solution Place 1 drop into both eyes at bedtime.    traZODone  (DESYREL ) 50 MG tablet TAKE ONE TABLET BY MOUTH AT BEDTIME   Calcium  Carb-Cholecalciferol (CALCIUM  500 + D PO) Take 2 tablets by mouth in the morning and at bedtime. GUMMY   Cholecalciferol (VITAMIN D3 MAXIMUM STRENGTH) 125 MCG (5000 UT) capsule Take 1 capsule by mouth daily at 6 (six) AM.   Cyanocobalamin  (VITAMIN B12 PO) Take 1 tablet by mouth daily at 6 (six) AM.   denosumab  (PROLIA ) 60 MG/ML SOSY injection every 6 (six) months.   No facility-administered encounter medications on file as of 04/14/2024.    Allergies (verified)  Fosamax [alendronate sodium], Hydrocodone , and Alendronate   History: Past Medical History:  Diagnosis Date   Anxiety    on meds   Breast cancer (HCC)    RIGHT   Cancer (HCC)    cancer of right breast   Cataracts, bilateral 2018   on meds   Depression    on meds   Environmental allergies 2007   GERD (gastroesophageal reflux disease)    hx of-uses OTC PRN meds   Glaucoma    on meds   Headache    chronic   Hepatitis    Hx: of Hepatitis not sure which one   Hot flashes    Hyperlipidemia    on meds   Hypertension    on meds   Osteoporosis    on meds-every 6 months   Personal history  of chemotherapy    Personal history of radiation therapy    PONV (postoperative nausea and vomiting)    S/P radiation therapy 03/01/2015 through 04/08/2015                                    03/01/2015 through 04/08/2015                                                                     Right breast 5040 cGy in 28 sessions (no boost)     Seasonal allergies    Shortness of breath    with exertion   Past Surgical History:  Procedure Laterality Date   BREAST ENHANCEMENT SURGERY  2008   BREAST LUMPECTOMY Right 2016   BREAST SURGERY     Biopsy   COLONOSCOPY  2018   KN-MAC-suprep/mira(exc)-polyp   DILATION AND CURETTAGE OF UTERUS     EYE SURGERY     Lasik   PORT-A-CATH REMOVAL N/A 02/01/2015   Procedure: REMOVAL PORT-A-CATH;  Surgeon: Ayesha Lente, MD;  Location: [facility];  Service: General;  Laterality: N/A;   PORTACATH PLACEMENT N/A 08/13/2014   Procedure: INSERTION PORT-A-CATH;  Surgeon: Ayesha Lente, MD;  Location: [facility] OR;  Service: General;  Laterality: N/A;   TONSILECTOMY/ADENOIDECTOMY WITH MYRINGOTOMY     TUBAL LIGATION     WISDOM TOOTH EXTRACTION     Family History  Problem Relation Age of Onset   Psoriasis Mother    Pulmonary embolism Mother    Hypertension Father    Glaucoma Father    Heart attack Father    Colon polyps Sister    Healthy Sister    Sleep apnea Sister    Healthy Sister    Thyroid  disease Maternal Grandmother    Asthma Paternal Grandmother    Esophageal cancer Neg Hx    Rectal cancer Neg Hx    Stomach cancer Neg Hx    Colon cancer Neg Hx    Social History   Socioeconomic History   Marital status: Married    Spouse name: Ambrosio Junker   Number of children: 2   Years of education: Not on file   Highest education level: High school graduate  Occupational History   Occupation: Location manager    Comment: [employer]  Tobacco Use   Smoking status: Never   Smokeless tobacco: Never  Vaping Use   Vaping status: Never Used  Substance  and Sexual Activity   Alcohol use: No   Drug use: No   Sexual activity: Not Currently  Other Topics Concern   Not on file  Social History Narrative   Lives at home with her husband   1 cup of coffee daily.     Occasional tea.   Social Drivers of Corporate investment banker Strain: Low Risk  (04/14/2024)   Overall Financial Resource Strain (CARDIA)    Difficulty of Paying Living Expenses: Not hard at all  Food Insecurity: No Food Insecurity (04/14/2024)   Hunger Vital Sign    Worried About Running Out of Food in the Last Year: Never true    Ran Out of Food in the Last Year: Never true  Transportation Needs: No Transportation Needs (04/14/2024)   PRAPARE - Administrator, Civil Service (Medical): No    Lack of Transportation (Non-Medical): No  Physical Activity: Inactive (04/14/2024)   Exercise Vital Sign    Days of Exercise per Week: 0 days    Minutes of Exercise per Session: 0 min  Stress: No Stress Concern Present (04/14/2024)   [employer] of Occupational Health - Occupational Stress Questionnaire    Feeling of Stress : Not at all  Social Connections: Socially Isolated (04/14/2024)   Social Connection and Isolation Panel [NHANES]    Frequency of Communication with Friends and Family: Three times a week    Frequency of Social Gatherings with Friends and Family: Once a week    Attends Religious Services: Never    Database administrator or Organizations: No    Attends Banker Meetings: Never    Marital Status: Widowed    Tobacco Counseling Counseling given: Not Answered   Clinical Intake:  Pre-visit preparation completed: Yes  Pain : No/denies pain     Diabetes: No  How often do you need to have someone help you when you read instructions, pamphlets, or other written materials from your doctor or pharmacy?: 1 - Never  Interpreter Needed?: No  Information entered by :: Kieth Pelt LPN   Activities of Daily Living    04/14/2024     9:47 AM  In your present state of health, do you have any difficulty performing the following activities:  Hearing? 0  Vision? 0  Difficulty concentrating or making decisions? 1  Walking or climbing stairs? 0  Dressing or bathing? 0  Doing errands, shopping? 0  Preparing Food and eating ? N  Using the Toilet? N  In the past six months, have you accidently leaked urine? N  Do you have problems with loss of bowel control? N  Managing your Medications? N  Managing your Finances? N  Housekeeping or managing your Housekeeping? N    Patient Care Team: Jess Morita, MD as PCP - General (Family Medicine) Ayesha Lente, MD (Inactive) as Consulting Physician (General Surgery) Magrinat, Rozella Cornfield, MD (Inactive) as Consulting Physician (Oncology) Opal Bill, MD (Inactive) as Consulting Physician (Radiation Oncology) Artemisa Bile, MD (Inactive) as Referring Physician (Obstetrics and Gynecology) Nandigam, Kavitha V, MD as Consulting Physician (Gastroenterology) Myrtie Atkinson, MD as Referring Physician (Specialist) Daphine Eagle, MD as Consulting Physician (Psychiatry)  Indicate any recent Medical Services you may have received from other than [facility] providers in the past year (date may be approximate).     Assessment:    This is a routine wellness examination for Madissen.  Hearing/Vision screen Hearing Screening -  Comments:: No trouble hearing Vision Screening - Comments:: Up to date  [facility]   Goals Addressed             This Visit's Progress    Patient Stated   Not on track    Be more active     Patient Stated       Eat healthier       Depression Screen    04/14/2024    9:50 AM 04/05/2023    9:42 AM 03/13/2023   11:21 AM 09/03/2022   10:14 AM 07/02/2022   10:06 AM 05/21/2022   10:31 AM 05/15/2022    1:44 PM  PHQ 2/9 Scores  PHQ - 2 Score 4 3 2 2  0 4 0  PHQ- 9 Score 16 8 5 12  0 14     Fall Risk    04/14/2024    9:33 AM 04/05/2023    9:42 AM 03/13/2023    11:09 AM 09/03/2022   10:14 AM 07/02/2022   10:06 AM  Fall Risk   Falls in the past year? 0 1 1 0 0  Number falls in past yr: 0 1 1    Comment   one ground unlevel  , rushing    Injury with Fall? 0 0 1    Comment   brusing no hospital visit    Risk for fall due to :  History of fall(s) History of fall(s) No Fall Risks No Fall Risks  Risk for fall due to: Comment   feels unsteady because of knee    Follow up Falls evaluation completed;Education provided;Falls prevention discussed Falls evaluation completed Falls evaluation completed;Education provided;Falls prevention discussed Falls evaluation completed Falls evaluation completed    MEDICARE RISK AT HOME: Medicare Risk at Home Any stairs in or around the home?: No If so, are there any without handrails?: No Home free of loose throw rugs in walkways, pet beds, electrical cords, etc?: Yes Adequate lighting in your home to reduce risk of falls?: Yes Life alert?: No Use of a cane, walker or w/c?: No Grab bars in the bathroom?: No Shower chair or bench in shower?: Yes Elevated toilet seat or a handicapped toilet?: Yes  TIMED UP AND GO:  Was the test performed?  No    Cognitive Function:    01/07/2019    8:38 AM  MMSE - Mini Mental State Exam  Orientation to time 5  Orientation to Place 5  Registration 3  Attention/ Calculation 5  Recall 2  Language- name 2 objects 2  Language- repeat 1  Language- follow 3 step command 3  Language- read & follow direction 1  Write a sentence 1  Copy design 1  Total score 29        04/14/2024    9:47 AM 03/13/2023   11:18 AM  6CIT Screen  What Year? 0 points 0 points  What month? 0 points 0 points  What time? 0 points 0 points  Count back from 20 0 points 0 points  Months in reverse 0 points 0 points  Repeat phrase 0 points 0 points  Total Score 0 points 0 points    Immunizations Immunization History  Administered Date(s) Administered   Fluad Quad(high Dose 65+) 08/31/2021    Influenza,inj,Quad PF,6+ Mos 08/08/2019, 07/02/2022   Influenza-Unspecified 09/16/2018   PFIZER(Purple Top)SARS-COV-2 Vaccination 01/17/2020, 02/08/2020   PNEUMOCOCCAL CONJUGATE-20 04/05/2023   Pneumococcal Conjugate-13 03/01/2021   Tdap 12/26/2011    TDAP status: Due, Education has been provided regarding the  importance of this vaccine. Advised may receive this vaccine at local pharmacy or Health Dept. Aware to provide a copy of the vaccination record if obtained from local pharmacy or Health Dept. Verbalized acceptance and understanding.  Flu Vaccine status: Up to date  Pneumococcal vaccine status: Up to date  Covid-19 vaccine status: Information provided on how to obtain vaccines.   Qualifies for Shingles Vaccine? Yes   Zostavax completed No   Shingrix Completed?: No.    Education has been provided regarding the importance of this vaccine. Patient has been advised to call insurance company to determine out of pocket expense if they have not yet received this vaccine. Advised may also receive vaccine at local pharmacy or Health Dept. Verbalized acceptance and understanding.  Screening Tests Health Maintenance  Topic Date Due   Zoster Vaccines- Shingrix (1 of 2) Never done   MAMMOGRAM  07/11/2023   INFLUENZA VACCINE  06/05/2024   Medicare Annual Wellness (AWV)  04/14/2025   Colonoscopy  04/16/2029   Pneumonia Vaccine 40+ Years old  Completed   DEXA SCAN  Completed   Hepatitis C Screening  Completed   HPV VACCINES  Aged Out   Meningococcal B Vaccine  Aged Out   DTaP/Tdap/Td  Discontinued   COVID-19 Vaccine  Discontinued    Health Maintenance  Health Maintenance Due  Topic Date Due   Zoster Vaccines- Shingrix (1 of 2) Never done   MAMMOGRAM  07/11/2023    Colorectal cancer screening: Type of screening: Colonoscopy. Completed 2023. Repeat every 7 years  Mammogram status: Completed  . Repeat every year  Bone Density status: Completed 2024. Results reflect: Bone density  results: OSTEOPOROSIS. Repeat every 2 years.  Lung Cancer Screening: (Low Dose CT Chest recommended if Age 34-80 years, 20 pack-year currently smoking OR have quit w/in 15years.) does not qualify.   Lung Cancer Screening Referral:   Additional Screening:  Hepatitis C Screening: does not qualify; Completed 2019  Vision Screening: Recommended annual ophthalmology exams for early detection of glaucoma and other disorders of the eye. Is the patient up to date with their annual eye exam?  Yes  Who is the provider or what is the name of the office in which the patient attends annual eye exams? [facility] If pt is not established with a provider, would they like to be referred to a provider to establish care? No .   Dental Screening: Recommended annual dental exams for proper oral hygiene    Community Resource Referral / Chronic Care Management: CRR required this visit?  No   CCM required this visit?  No     Plan:     I have personally reviewed and noted the following in the patient's chart:   Medical and social history Use of alcohol, tobacco or illicit drugs  Current medications and supplements including opioid prescriptions. Patient is not currently taking opioid prescriptions. Functional ability and status Nutritional status Physical activity Advanced directives List of other physicians Hospitalizations, surgeries, and ER visits in previous 12 months Vitals Screenings to include cognitive, depression, and falls Referrals and appointments  In addition, I have reviewed and discussed with patient certain preventive protocols, quality metrics, and best practice recommendations. A written personalized care plan for preventive services as well as general preventive health recommendations were provided to patient.     Kieth Pelt, LPN   [id number]/[id number]   After Visit Summary: (MyChart) Due to this being a telephonic visit, the after visit summary with patients personalized plan was  offered to  patient via MyChart   Nurse Notes:  patient stated she is unable to get her Prolia  filled due to cost.   She will call Dr. Walter Gunning office to inquire on cost increase.

## 2024-04-14 NOTE — Patient Instructions (Signed)
 Ms. Melissa Garza , Thank you for taking time to come for your Medicare Wellness Visit. I appreciate your ongoing commitment to your health goals. Please review the following plan we discussed and let me know if I can assist you in the future.   Screening recommendations/referrals: Colonoscopy: up to date Mammogram: up to date Bone Density: up to date Recommended yearly ophthalmology/optometry visit for glaucoma screening and checkup Recommended yearly dental visit for hygiene and checkup  Vaccinations: Influenza vaccine: up to date Pneumococcal vaccine: up to date Tdap vaccine: education provided Shingles vaccine: Education provided      Preventive Care 65 Years and Older, Female Preventive care refers to lifestyle choices and visits with your health care provider that can promote health and wellness. What does preventive care include? A yearly physical exam. This is also called an annual well check. Dental exams once or twice a year. Routine eye exams. Ask your health care provider how often you should have your eyes checked. Personal lifestyle choices, including: Daily care of your teeth and gums. Regular physical activity. Eating a healthy diet. Avoiding tobacco and drug use. Limiting alcohol use. Practicing safe sex. Taking low-dose aspirin every day. Taking vitamin and mineral supplements as recommended by your health care provider. What happens during an annual well check? The services and screenings done by your health care provider during your annual well check will depend on your age, overall health, lifestyle risk factors, and family history of disease. Counseling  Your health care provider may ask you questions about your: Alcohol use. Tobacco use. Drug use. Emotional well-being. Home and relationship well-being. Sexual activity. Eating habits. History of falls. Memory and ability to understand (cognition). Work and work Astronomer. Reproductive  health. Screening  You may have the following tests or measurements: Height, weight, and BMI. Blood pressure. Lipid and cholesterol levels. These may be checked every 5 years, or more frequently if you are over 51 years old. Skin check. Lung cancer screening. You may have this screening every year starting at age 70 if you have a 30-pack-year history of smoking and currently smoke or have quit within the past 15 years. Fecal occult blood test (FOBT) of the stool. You may have this test every year starting at age 93. Flexible sigmoidoscopy or colonoscopy. You may have a sigmoidoscopy every 5 years or a colonoscopy every 10 years starting at age 38. Hepatitis C blood test. Hepatitis B blood test. Sexually transmitted disease (STD) testing. Diabetes screening. This is done by checking your blood sugar (glucose) after you have not eaten for a while (fasting). You may have this done every 1-3 years. Bone density scan. This is done to screen for osteoporosis. You may have this done starting at age 19. Mammogram. This may be done every 1-2 years. Talk to your health care provider about how often you should have regular mammograms. Talk with your health care provider about your test results, treatment options, and if necessary, the need for more tests. Vaccines  Your health care provider may recommend certain vaccines, such as: Influenza vaccine. This is recommended every year. Tetanus, diphtheria, and acellular pertussis (Tdap, Td) vaccine. You may need a Td booster every 10 years. Zoster vaccine. You may need this after age 27. Pneumococcal 13-valent conjugate (PCV13) vaccine. One dose is recommended after age 55. Pneumococcal polysaccharide (PPSV23) vaccine. One dose is recommended after age 84. Talk to your health care provider about which screenings and vaccines you need and how often you need them. This information is  not intended to replace advice given to you by your health care provider.  Make sure you discuss any questions you have with your health care provider. Document Released: 11/18/2015 Document Revised: 07/11/2016 Document Reviewed: 08/23/2015 Elsevier Interactive Patient Education  2017 [employer].  Fall Prevention in the Home Falls can cause injuries. They can happen to people of all ages. There are many things you can do to make your home safe and to help prevent falls. What can I do on the outside of my home? Regularly fix the edges of walkways and driveways and fix any cracks. Remove anything that might make you trip as you walk through a door, such as a raised step or threshold. Trim any bushes or trees on the path to your home. Use bright outdoor lighting. Clear any walking paths of anything that might make someone trip, such as rocks or tools. Regularly check to see if handrails are loose or broken. Make sure that both sides of any steps have handrails. Any raised decks and porches should have guardrails on the edges. Have any leaves, snow, or ice cleared regularly. Use sand or salt on walking paths during winter. Clean up any spills in your garage right away. This includes oil or grease spills. What can I do in the bathroom? Use night lights. Install grab bars by the toilet and in the tub and shower. Do not use towel bars as grab bars. Use non-skid mats or decals in the tub or shower. If you need to sit down in the shower, use a plastic, non-slip stool. Keep the floor dry. Clean up any water that spills on the floor as soon as it happens. Remove soap buildup in the tub or shower regularly. Attach bath mats securely with double-sided non-slip rug tape. Do not have throw rugs and other things on the floor that can make you trip. What can I do in the bedroom? Use night lights. Make sure that you have a light by your bed that is easy to reach. Do not use any sheets or blankets that are too big for your bed. They should not hang down onto the floor. Have a  firm chair that has side arms. You can use this for support while you get dressed. Do not have throw rugs and other things on the floor that can make you trip. What can I do in the kitchen? Clean up any spills right away. Avoid walking on wet floors. Keep items that you use a lot in easy-to-reach places. If you need to reach something above you, use a strong step stool that has a grab bar. Keep electrical cords out of the way. Do not use floor polish or wax that makes floors slippery. If you must use wax, use non-skid floor wax. Do not have throw rugs and other things on the floor that can make you trip. What can I do with my stairs? Do not leave any items on the stairs. Make sure that there are handrails on both sides of the stairs and use them. Fix handrails that are broken or loose. Make sure that handrails are as long as the stairways. Check any carpeting to make sure that it is firmly attached to the stairs. Fix any carpet that is loose or worn. Avoid having throw rugs at the top or bottom of the stairs. If you do have throw rugs, attach them to the floor with carpet tape. Make sure that you have a light switch at the top of the  stairs and the bottom of the stairs. If you do not have them, ask someone to add them for you. What else can I do to help prevent falls? Wear shoes that: Do not have high heels. Have rubber bottoms. Are comfortable and fit you well. Are closed at the toe. Do not wear sandals. If you use a stepladder: Make sure that it is fully opened. Do not climb a closed stepladder. Make sure that both sides of the stepladder are locked into place. Ask someone to hold it for you, if possible. Clearly mark and make sure that you can see: Any grab bars or handrails. First and last steps. Where the edge of each step is. Use tools that help you move around (mobility aids) if they are needed. These include: Canes. Walkers. Scooters. Crutches. Turn on the lights when you  go into a dark area. Replace any light bulbs as soon as they burn out. Set up your furniture so you have a clear path. Avoid moving your furniture around. If any of your floors are uneven, fix them. If there are any pets around you, be aware of where they are. Review your medicines with your doctor. Some medicines can make you feel dizzy. This can increase your chance of falling. Ask your doctor what other things that you can do to help prevent falls. This information is not intended to replace advice given to you by your health care provider. Make sure you discuss any questions you have with your health care provider. Document Released: 08/18/2009 Document Revised: 03/29/2016 Document Reviewed: 11/26/2014 Elsevier Interactive Patient Education  2017 [employer].

## 2024-04-15 ENCOUNTER — Other Ambulatory Visit: Payer: Self-pay | Admitting: Family Medicine

## 2024-04-15 DIAGNOSIS — G43709 Chronic migraine without aura, not intractable, without status migrainosus: Secondary | ICD-10-CM

## 2024-05-25 DIAGNOSIS — H401233 Low-tension glaucoma, bilateral, severe stage: Secondary | ICD-10-CM | POA: Diagnosis not present

## 2024-05-29 ENCOUNTER — Other Ambulatory Visit: Payer: Self-pay | Admitting: Family Medicine

## 2024-05-29 DIAGNOSIS — F32A Depression, unspecified: Secondary | ICD-10-CM

## 2024-06-04 ENCOUNTER — Other Ambulatory Visit: Payer: Self-pay | Admitting: Family Medicine

## 2024-06-08 ENCOUNTER — Other Ambulatory Visit: Payer: Self-pay | Admitting: Family Medicine

## 2024-06-08 DIAGNOSIS — Z1231 Encounter for screening mammogram for malignant neoplasm of breast: Secondary | ICD-10-CM

## 2024-06-18 DIAGNOSIS — M810 Age-related osteoporosis without current pathological fracture: Secondary | ICD-10-CM | POA: Diagnosis not present

## 2024-06-22 ENCOUNTER — Other Ambulatory Visit: Payer: Self-pay | Admitting: Family Medicine

## 2024-06-24 ENCOUNTER — Ambulatory Visit
Admission: RE | Admit: 2024-06-24 | Discharge: 2024-06-24 | Disposition: A | Discharge status: Home / Self Care | Source: Ambulatory Visit | Attending: Family Medicine | Admitting: Family Medicine

## 2024-06-24 DIAGNOSIS — Z1231 Encounter for screening mammogram for malignant neoplasm of breast: Secondary | ICD-10-CM

## 2024-06-29 ENCOUNTER — Other Ambulatory Visit: Payer: Self-pay | Admitting: Family Medicine

## 2024-06-29 MED ORDER — BUPROPION HCL ER (XL) 300 MG PO TB24: 300.0000 mg | ORAL_TABLET | Freq: Every day | ORAL | 0 refills | Status: DC

## 2024-06-29 NOTE — Telephone Encounter (Signed)
 Copied from CRM [id number]. Topic: Clinical - Medication Refill >> Jun 29, 2024 11:10 AM Vena HERO wrote: Medication: buPROPion  (WELLBUTRIN  XL) 300 MG 24 hr tablet  Has the patient contacted their pharmacy? Yes (Agent: If no, request that the patient contact the pharmacy for the refill. If patient does not wish to contact the pharmacy document the reason why and proceed with request.) (Agent: If yes, when and what did the pharmacy advise?) Pharmacy states to contact provider  This is the patient's preferred pharmacy:  [facility] - [place], [place] - [address] [facility] [address] [facility] [address] [place] [address] Phone: [phone] Fax: [phone]   Is this the correct pharmacy for this prescription? Yes If no, delete pharmacy and type the correct one.   Has the prescription been filled recently? No  Is the patient out of the medication? Yes  Has the patient been seen for an appointment in the last year OR does the patient have an upcoming appointment? Yes  Can we respond through MyChart? Yes  Pt is asking if she can receive a 3 month fill so she does not keep having this issue of getting it filled every month  Agent: Please be advised that Rx refills may take up to 3 business days. We ask that you follow-up with your pharmacy.

## 2024-07-31 ENCOUNTER — Other Ambulatory Visit: Payer: Self-pay | Admitting: Family Medicine

## 2024-07-31 NOTE — Telephone Encounter (Signed)
 Refused medication. Pt was given 30 last refill and told she needs an appt.

## 2024-08-03 ENCOUNTER — Other Ambulatory Visit: Payer: Self-pay | Admitting: Family Medicine

## 2024-08-03 DIAGNOSIS — G43709 Chronic migraine without aura, not intractable, without status migrainosus: Secondary | ICD-10-CM

## 2024-08-04 NOTE — Telephone Encounter (Signed)
 Scheduled follow up appointment for 08/10/24

## 2024-08-04 NOTE — Telephone Encounter (Signed)
 Pt need appt  Last OV 03/2023

## 2024-08-10 ENCOUNTER — Ambulatory Visit (INDEPENDENT_AMBULATORY_CARE_PROVIDER_SITE_OTHER): Admitting: Family Medicine

## 2024-08-10 ENCOUNTER — Encounter: Payer: Self-pay | Admitting: Family Medicine

## 2024-08-10 VITALS — BP 104/68 | HR 60 | Temp 97.9°F | Ht 62.0 in | Wt 163.2 lb

## 2024-08-10 DIAGNOSIS — E663 Overweight: Secondary | ICD-10-CM | POA: Diagnosis not present

## 2024-08-10 DIAGNOSIS — I10 Essential (primary) hypertension: Secondary | ICD-10-CM | POA: Diagnosis not present

## 2024-08-10 DIAGNOSIS — E785 Hyperlipidemia, unspecified: Secondary | ICD-10-CM | POA: Diagnosis not present

## 2024-08-10 DIAGNOSIS — Z79899 Other long term (current) drug therapy: Secondary | ICD-10-CM | POA: Diagnosis not present

## 2024-08-10 DIAGNOSIS — L400 Psoriasis vulgaris: Secondary | ICD-10-CM | POA: Diagnosis not present

## 2024-08-10 LAB — LIPID PANEL
Cholesterol: 145 mg/dL (ref 0–200)
HDL: 58.7 mg/dL (ref >39.00)
LDL Cholesterol: 64 mg/dL (ref 0–99)
NonHDL: 86.28
Total CHOL/HDL Ratio: 2
Triglycerides: 109 mg/dL (ref 0.0–149.0)
VLDL: 21.8 mg/dL (ref 0.0–40.0)

## 2024-08-10 LAB — BASIC METABOLIC PANEL WITH GFR
BUN: 16 mg/dL (ref 6–23)
CO2: 26 meq/L (ref 19–32)
Calcium: 9.2 mg/dL (ref 8.4–10.5)
Chloride: 109 meq/L (ref 96–112)
Creatinine, Ser: 1 mg/dL (ref 0.40–1.20)
GFR: 57.76 mL/min — ABNORMAL LOW (ref >60.00)
Glucose, Bld: 89 mg/dL (ref 70–99)
Potassium: 4.2 meq/L (ref 3.5–5.1)
Sodium: 143 meq/L (ref 135–145)

## 2024-08-10 LAB — CBC WITH DIFFERENTIAL/PLATELET
Basophils Absolute: 0 K/uL (ref 0.0–0.1)
Basophils Relative: 0.8 % (ref 0.0–3.0)
Eosinophils Absolute: 0.3 K/uL (ref 0.0–0.7)
Eosinophils Relative: 5.6 % — ABNORMAL HIGH (ref 0.0–5.0)
HCT: 36.3 % (ref 36.0–46.0)
Hemoglobin: 12 g/dL (ref 12.0–15.0)
Lymphocytes Relative: 35.2 % (ref 12.0–46.0)
Lymphs Abs: 1.9 K/uL (ref 0.7–4.0)
MCHC: 33 g/dL (ref 30.0–36.0)
MCV: 97.5 fl (ref 78.0–100.0)
Monocytes Absolute: 0.3 K/uL (ref 0.1–1.0)
Monocytes Relative: 5.2 % (ref 3.0–12.0)
Neutro Abs: 2.9 K/uL (ref 1.4–7.7)
Neutrophils Relative %: 53.2 % (ref 43.0–77.0)
Platelets: 172 K/uL (ref 150.0–400.0)
RBC: 3.73 Mil/uL — ABNORMAL LOW (ref 3.87–5.11)
RDW: 13.3 % (ref 11.5–15.5)
WBC: 5.4 K/uL (ref 4.0–10.5)

## 2024-08-10 LAB — HEPATIC FUNCTION PANEL
ALT: 13 U/L (ref 0–35)
AST: 12 U/L (ref 0–37)
Albumin: 4 g/dL (ref 3.5–5.2)
Alkaline Phosphatase: 75 U/L (ref 39–117)
Bilirubin, Direct: 0.1 mg/dL (ref 0.0–0.3)
Total Bilirubin: 0.2 mg/dL (ref 0.2–1.2)
Total Protein: 6.5 g/dL (ref 6.0–8.3)

## 2024-08-10 LAB — TSH: TSH: 1.2 u[IU]/mL (ref 0.35–5.50)

## 2024-08-10 MED ORDER — BUPROPION HCL ER (XL) 300 MG PO TB24: 300.0000 mg | ORAL_TABLET | Freq: Every day | ORAL | 3 refills | Status: AC

## 2024-08-10 NOTE — Progress Notes (Signed)
   Subjective:    Patient ID: Melissa Garza, female    DOB: 1955-12-08, 68 y.o.   MRN: [id number]  HPI HTN- chronic problem, on Metoprolol  XL 25mg  daily w/ excellent control.  No CP, SOB.  + HA's- possibly stress related w/ husband's passing.  No visual changes.  Denies edema.  Hyperlipidemia- chronic problem, on Crestor  10mg  daily.  No abd pain, N/V  Overweight- pt has gained 5 lbs since August.  BMI 29.86.  no regular exercise.  Having groceries and food delivered.  Not cooking much since husband passed.   Review of Systems For ROS see HPI     Objective:   Physical Exam Vitals reviewed.  Constitutional:      General: She is not in acute distress.    Appearance: Normal appearance. She is well-developed. She is not ill-appearing.  HENT:     Head: Normocephalic and atraumatic.  Eyes:     Conjunctiva/sclera: Conjunctivae normal.     Pupils: Pupils are equal, round, and reactive to light.  Neck:     Thyroid : No thyromegaly.  Cardiovascular:     Rate and Rhythm: Normal rate and regular rhythm.     Pulses: Normal pulses.     Heart sounds: Normal heart sounds. No murmur heard. Pulmonary:     Effort: Pulmonary effort is normal. No respiratory distress.     Breath sounds: Normal breath sounds.  Abdominal:     General: There is no distension.     Palpations: Abdomen is soft.     Tenderness: There is no abdominal tenderness.  Musculoskeletal:     Cervical back: Normal range of motion and neck supple.     Right lower leg: No edema.     Left lower leg: No edema.  Lymphadenopathy:     Cervical: No cervical adenopathy.  Skin:    General: Skin is warm and dry.  Neurological:     General: No focal deficit present.     Mental Status: She is alert and oriented to person, place, and time.  Psychiatric:        Mood and Affect: Mood normal.        Behavior: Behavior normal.           Assessment & Plan:

## 2024-08-10 NOTE — Assessment & Plan Note (Signed)
 Chronic problem.  On Crestor  10mg  daily w/o difficulty.  Check labs.  Adjust meds prn

## 2024-08-10 NOTE — Patient Instructions (Signed)
Schedule your complete physical in 6 months We'll notify you of your lab results and make any changes if needed Continue to work on healthy diet and regular exercise- you can do it!! Call with any questions or concerns Stay Safe!  Stay Healthy! Hang in there!!! 

## 2024-08-10 NOTE — Assessment & Plan Note (Addendum)
 Chronic problem.  Currently well controlled on Metoprolol  XL 25mg  daily.  Asymptomatic.  No med changes at this time.

## 2024-08-10 NOTE — Assessment & Plan Note (Signed)
 Ongoing issue.  Pt has gained 5 lbs since August.  BMI now 29.86.  She is not eating regularly, cooking regularly, and is having food and groceries delivered since husband passed.  No exercise.  Encouraged her to make healthy food choices and get regular physical activity.  Will follow.

## 2024-08-11 ENCOUNTER — Ambulatory Visit: Payer: Self-pay | Admitting: Family Medicine

## 2024-08-11 NOTE — Progress Notes (Signed)
 Pt has been notified of lab results

## 2024-09-10 ENCOUNTER — Other Ambulatory Visit: Payer: Self-pay | Admitting: Family Medicine

## 2024-09-10 DIAGNOSIS — I10 Essential (primary) hypertension: Secondary | ICD-10-CM

## 2024-09-18 ENCOUNTER — Other Ambulatory Visit: Payer: Self-pay | Admitting: Family Medicine

## 2024-09-18 DIAGNOSIS — F32A Depression, unspecified: Secondary | ICD-10-CM

## 2024-09-28 DIAGNOSIS — H2512 Age-related nuclear cataract, left eye: Secondary | ICD-10-CM | POA: Diagnosis not present

## 2024-09-28 DIAGNOSIS — H04123 Dry eye syndrome of bilateral lacrimal glands: Secondary | ICD-10-CM | POA: Diagnosis not present

## 2024-09-28 DIAGNOSIS — H401233 Low-tension glaucoma, bilateral, severe stage: Secondary | ICD-10-CM | POA: Diagnosis not present

## 2024-09-28 DIAGNOSIS — Z961 Presence of intraocular lens: Secondary | ICD-10-CM | POA: Diagnosis not present

## 2024-10-02 ENCOUNTER — Other Ambulatory Visit: Payer: Self-pay | Admitting: Family Medicine

## 2024-10-02 DIAGNOSIS — F32A Depression, unspecified: Secondary | ICD-10-CM

## 2024-10-18 ENCOUNTER — Other Ambulatory Visit: Payer: Self-pay | Admitting: Family Medicine

## 2024-10-29 ENCOUNTER — Other Ambulatory Visit: Payer: Self-pay | Admitting: Family Medicine

## 2024-10-29 DIAGNOSIS — G43709 Chronic migraine without aura, not intractable, without status migrainosus: Secondary | ICD-10-CM

## 2024-11-26 ENCOUNTER — Telehealth: Payer: Self-pay

## 2024-11-26 DIAGNOSIS — F32A Depression, unspecified: Secondary | ICD-10-CM

## 2024-11-26 MED ORDER — TRAZODONE HCL 50 MG PO TABS: 50.0000 mg | ORAL_TABLET | Freq: Every day | ORAL | 1 refills | Status: AC

## 2024-11-26 NOTE — Telephone Encounter (Signed)
 Requested Prescriptions   Pending Prescriptions Disp Refills   traZODone  (DESYREL ) 50 MG tablet 30 tablet 6    Sig: Take 1 tablet (50 mg total) by mouth at bedtime.     Date of patient request: 11/26/24 Last office visit: 08/10/24 Upcoming visit: 04/07/25 Date of last refill: 09/21/24 Last refill amount: 30    Copied from CRM #[id number]. Topic: Clinical - Medication Refill >> Nov 26, 2024 12:43 PM Rea ORN wrote: Medication:  traZODone  (DESYREL ) 50 MG tablet   Has the patient contacted their pharmacy? No (Agent: If no, request that the patient contact the pharmacy for the refill. If patient does not wish to contact the pharmacy document the reason why and proceed with request.) (Agent: If yes, when and what did the pharmacy advise?)  This is the patient's preferred pharmacy:  [facility] - [place], [place] - [address] [place] [address] Phone: [phone] Fax: [phone]    Is this the correct pharmacy for this prescription? Yes If no, delete pharmacy and type the correct one.   Has the prescription been filled recently? No  Is the patient out of the medication? No, 3 tablets left  Has the patient been seen for an appointment in the last year OR does the patient have an upcoming appointment? Yes  Can we respond through MyChart? No  Agent: Please be advised that Rx refills may take up to 3 business days. We ask that you follow-up with your pharmacy.

## 2025-04-07 ENCOUNTER — Encounter: Admitting: Family Medicine

## 2025-04-20 ENCOUNTER — Encounter
